# Patient Record
Sex: Female | Born: 1935 | Race: White | Hispanic: No | Marital: Married | Smoking: Former smoker
Health system: Southern US, Community
[De-identification: ages and names within clinical notes are randomized; demographics above are authoritative.]

## PROBLEM LIST (undated history)

## (undated) ENCOUNTER — Ambulatory Visit: Payer: MEDICARE

## (undated) ENCOUNTER — Encounter

## (undated) ENCOUNTER — Encounter: Attending: Internal Medicine | Primary: Internal Medicine

## (undated) ENCOUNTER — Encounter: Attending: Adult Health | Primary: Adult Health

## (undated) ENCOUNTER — Telehealth

## (undated) ENCOUNTER — Encounter
Attending: Student in an Organized Health Care Education/Training Program | Primary: Student in an Organized Health Care Education/Training Program

## (undated) ENCOUNTER — Encounter: Attending: Nurse Practitioner | Primary: Nurse Practitioner

## (undated) ENCOUNTER — Ambulatory Visit

## (undated) ENCOUNTER — Encounter: Attending: Women's Health | Primary: Women's Health

## (undated) ENCOUNTER — Encounter: Attending: Physical Medicine & Rehabilitation | Primary: Physical Medicine & Rehabilitation

## (undated) ENCOUNTER — Encounter: Attending: Diagnostic Radiology | Primary: Diagnostic Radiology

## (undated) ENCOUNTER — Encounter: Attending: Surgical Oncology | Primary: Surgical Oncology

## (undated) ENCOUNTER — Ambulatory Visit: Payer: MEDICARE | Attending: Physical Medicine & Rehabilitation | Primary: Physical Medicine & Rehabilitation

## (undated) ENCOUNTER — Telehealth: Attending: Internal Medicine | Primary: Internal Medicine

## (undated) ENCOUNTER — Ambulatory Visit: Attending: Physician Assistant | Primary: Physician Assistant

## (undated) ENCOUNTER — Ambulatory Visit: Payer: MEDICARE | Attending: Orthopaedic Surgery of the Spine | Primary: Orthopaedic Surgery of the Spine

## (undated) DIAGNOSIS — E039 Hypothyroidism, unspecified: Secondary | ICD-10-CM

## (undated) DIAGNOSIS — I10 Essential (primary) hypertension: Secondary | ICD-10-CM

## (undated) DIAGNOSIS — I209 Angina pectoris, unspecified: Secondary | ICD-10-CM

## (undated) DIAGNOSIS — F419 Anxiety disorder, unspecified: Secondary | ICD-10-CM

## (undated) DIAGNOSIS — D6859 Other primary thrombophilia: Secondary | ICD-10-CM

## (undated) DIAGNOSIS — E119 Type 2 diabetes mellitus without complications: Secondary | ICD-10-CM

## (undated) HISTORY — PX: FRACTURE SURGERY: SHX138

## (undated) HISTORY — PX: TONSILLECTOMY: SUR1361

## (undated) HISTORY — PX: BACK SURGERY: SHX140

## (undated) HISTORY — PX: ABDOMINAL SURGERY: SHX537

## (undated) HISTORY — PX: BREAST EXCISIONAL BIOPSY: SUR124

---
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
## 2010-02-20 DIAGNOSIS — I82409 Acute embolism and thrombosis of unspecified deep veins of unspecified lower extremity: Secondary | ICD-10-CM | POA: Insufficient documentation

## 2010-02-20 DIAGNOSIS — E559 Vitamin D deficiency, unspecified: Secondary | ICD-10-CM | POA: Insufficient documentation

## 2010-02-20 DIAGNOSIS — E1121 Type 2 diabetes mellitus with diabetic nephropathy: Secondary | ICD-10-CM | POA: Insufficient documentation

## 2010-02-20 DIAGNOSIS — E782 Mixed hyperlipidemia: Secondary | ICD-10-CM | POA: Insufficient documentation

## 2011-09-18 DIAGNOSIS — M8580 Other specified disorders of bone density and structure, unspecified site: Secondary | ICD-10-CM | POA: Insufficient documentation

## 2012-01-11 DIAGNOSIS — S8990XA Unspecified injury of unspecified lower leg, initial encounter: Secondary | ICD-10-CM | POA: Insufficient documentation

## 2013-05-15 DIAGNOSIS — S92919A Unspecified fracture of unspecified toe(s), initial encounter for closed fracture: Secondary | ICD-10-CM | POA: Insufficient documentation

## 2014-11-19 DIAGNOSIS — I82401 Acute embolism and thrombosis of unspecified deep veins of right lower extremity: Secondary | ICD-10-CM | POA: Insufficient documentation

## 2014-11-19 DIAGNOSIS — K580 Irritable bowel syndrome with diarrhea: Secondary | ICD-10-CM | POA: Insufficient documentation

## 2015-01-20 ENCOUNTER — Emergency Department: Payer: Self-pay | Admitting: Student

## 2015-02-03 ENCOUNTER — Ambulatory Visit: Payer: Self-pay | Admitting: Internal Medicine

## 2015-02-25 LAB — BASIC METABOLIC PANEL
Anion Gap: 10 (ref 7–16)
BUN: 23 mg/dL — ABNORMAL HIGH
Calcium, Total: 9.1 mg/dL
Chloride: 105 mmol/L
Co2: 23 mmol/L
Creatinine: 0.94 mg/dL
EGFR (African American): >60
EGFR (Non-African Amer.): 59 — ABNORMAL LOW
Glucose: 142 mg/dL — ABNORMAL HIGH
Potassium: 4.3 mmol/L
Sodium: 138 mmol/L

## 2015-02-25 LAB — APTT: Activated PTT: 33.2 secs (ref 23.6–35.9)

## 2015-02-25 LAB — CBC
HCT: 43.2 % (ref 35.0–47.0)
HGB: 14.3 g/dL (ref 12.0–16.0)
MCH: 29.8 pg (ref 26.0–34.0)
MCHC: 33 g/dL (ref 32.0–36.0)
MCV: 90 fL (ref 80–100)
Platelet: 193 10*3/uL (ref 150–440)
RBC: 4.79 10*6/uL (ref 3.80–5.20)
RDW: 15 % — ABNORMAL HIGH (ref 11.5–14.5)
WBC: 10.3 10*3/uL (ref 3.6–11.0)

## 2015-02-25 LAB — PROTIME-INR
INR: 2.4
Prothrombin Time: 26.6 secs — ABNORMAL HIGH

## 2015-02-26 LAB — CBC WITH DIFFERENTIAL/PLATELET
Basophil #: 0 10*3/uL (ref 0.0–0.1)
Basophil %: 0.2 %
Eosinophil #: 0 10*3/uL (ref 0.0–0.7)
Eosinophil %: 0 %
HCT: 44.6 % (ref 35.0–47.0)
HGB: 14.6 g/dL (ref 12.0–16.0)
Lymphocyte #: 0.6 10*3/uL — ABNORMAL LOW (ref 1.0–3.6)
Lymphocyte %: 4.9 %
MCH: 29.5 pg (ref 26.0–34.0)
MCHC: 32.8 g/dL (ref 32.0–36.0)
MCV: 90 fL (ref 80–100)
Monocyte #: 0.9 x10 3/mm (ref 0.2–0.9)
Monocyte %: 7.2 %
Neutrophil #: 10.5 10*3/uL — ABNORMAL HIGH (ref 1.4–6.5)
Neutrophil %: 87.7 %
Platelet: 180 10*3/uL (ref 150–440)
RBC: 4.95 10*6/uL (ref 3.80–5.20)
RDW: 15.4 % — ABNORMAL HIGH (ref 11.5–14.5)
WBC: 12 10*3/uL — ABNORMAL HIGH (ref 3.6–11.0)

## 2015-02-26 LAB — BASIC METABOLIC PANEL
Anion Gap: 9 (ref 7–16)
BUN: 17 mg/dL
Calcium, Total: 8.7 mg/dL — ABNORMAL LOW
Chloride: 107 mmol/L
Co2: 24 mmol/L
Creatinine: 0.75 mg/dL
EGFR (African American): >60
EGFR (Non-African Amer.): >60
Glucose: 201 mg/dL — ABNORMAL HIGH
Potassium: 4.5 mmol/L
Sodium: 140 mmol/L

## 2015-02-26 LAB — PROTIME-INR
INR: 2.2
Prothrombin Time: 24.7 secs — ABNORMAL HIGH

## 2015-02-26 LAB — MAGNESIUM: Magnesium: 1.7 mg/dL

## 2015-02-26 LAB — HEMOGLOBIN A1C: Hemoglobin A1C: 6.2 % — ABNORMAL HIGH

## 2015-02-27 LAB — CBC WITH DIFFERENTIAL/PLATELET
Basophil #: 0 10*3/uL (ref 0.0–0.1)
Basophil %: 0.2 %
Eosinophil #: 0 10*3/uL (ref 0.0–0.7)
Eosinophil %: 0.3 %
HCT: 39 % (ref 35.0–47.0)
HGB: 12.9 g/dL (ref 12.0–16.0)
Lymphocyte #: 0.9 10*3/uL — ABNORMAL LOW (ref 1.0–3.6)
Lymphocyte %: 9.6 %
MCH: 30 pg (ref 26.0–34.0)
MCHC: 33.2 g/dL (ref 32.0–36.0)
MCV: 90 fL (ref 80–100)
Monocyte #: 1.1 x10 3/mm — ABNORMAL HIGH (ref 0.2–0.9)
Monocyte %: 12.2 %
Neutrophil #: 7 10*3/uL — ABNORMAL HIGH (ref 1.4–6.5)
Neutrophil %: 77.7 %
Platelet: 149 10*3/uL — ABNORMAL LOW (ref 150–440)
RBC: 4.31 10*6/uL (ref 3.80–5.20)
RDW: 15.1 % — ABNORMAL HIGH (ref 11.5–14.5)
WBC: 9 10*3/uL (ref 3.6–11.0)

## 2015-02-27 LAB — APTT
Activated PTT: 30.1 secs (ref 23.6–35.9)
Activated PTT: 26.9 secs (ref 23.6–35.9)
Activated PTT: 69.9 secs — ABNORMAL HIGH (ref 23.6–35.9)

## 2015-02-27 LAB — PROTIME-INR
INR: 1.4
Prothrombin Time: 17.6 secs — ABNORMAL HIGH

## 2015-02-28 ENCOUNTER — Inpatient Hospital Stay
Admit: 2015-02-28 | Disposition: A | Discharge status: Skilled Nursing Facility | Payer: Self-pay | Attending: Internal Medicine | Admitting: Internal Medicine

## 2015-02-28 LAB — CBC WITH DIFFERENTIAL/PLATELET
Basophil #: 0 10*3/uL (ref 0.0–0.1)
Basophil %: 0.4 %
Eosinophil #: 0.1 10*3/uL (ref 0.0–0.7)
Eosinophil %: 0.7 %
HCT: 41.3 % (ref 35.0–47.0)
HGB: 13.8 g/dL (ref 12.0–16.0)
Lymphocyte #: 1 10*3/uL (ref 1.0–3.6)
Lymphocyte %: 11.9 %
MCH: 30.1 pg (ref 26.0–34.0)
MCHC: 33.3 g/dL (ref 32.0–36.0)
MCV: 90 fL (ref 80–100)
Monocyte #: 1.1 x10 3/mm — ABNORMAL HIGH (ref 0.2–0.9)
Monocyte %: 13.2 %
Neutrophil #: 6.4 10*3/uL (ref 1.4–6.5)
Neutrophil %: 73.8 %
Platelet: 163 10*3/uL (ref 150–440)
RBC: 4.58 10*6/uL (ref 3.80–5.20)
RDW: 14.6 % — ABNORMAL HIGH (ref 11.5–14.5)
WBC: 8.6 10*3/uL (ref 3.6–11.0)

## 2015-02-28 LAB — PROTIME-INR
INR: 1.1
INR: 1
Prothrombin Time: 14 secs
Prothrombin Time: 13.8 secs

## 2015-02-28 LAB — APTT: Activated PTT: 26.9 secs (ref 23.6–35.9)

## 2015-02-28 LAB — HEPARIN LEVEL (UNFRACTIONATED): Anti-Xa(Unfractionated): <0.1 IU/mL — ABNORMAL LOW (ref 0.30–0.70)

## 2015-03-01 LAB — BASIC METABOLIC PANEL
Anion Gap: 8 (ref 7–16)
BUN: 11 mg/dL
Calcium, Total: 8.5 mg/dL — ABNORMAL LOW
Chloride: 106 mmol/L
Co2: 30 mmol/L
Creatinine: 0.7 mg/dL
EGFR (African American): >60
EGFR (Non-African Amer.): >60
Glucose: 156 mg/dL — ABNORMAL HIGH
Potassium: 3.3 mmol/L — ABNORMAL LOW
Sodium: 144 mmol/L

## 2015-03-01 LAB — HEMOGLOBIN: HGB: 13.5 g/dL (ref 12.0–16.0)

## 2015-03-01 LAB — PLATELET COUNT: Platelet: 175 10*3/uL (ref 150–440)

## 2015-03-01 LAB — PROTIME-INR
INR: 1.1
Prothrombin Time: 14.5 secs

## 2015-03-02 LAB — BASIC METABOLIC PANEL
Anion Gap: 5 — ABNORMAL LOW (ref 7–16)
BUN: 14 mg/dL
Calcium, Total: 8.6 mg/dL — ABNORMAL LOW
Chloride: 105 mmol/L
Co2: 30 mmol/L
Creatinine: 0.71 mg/dL
EGFR (African American): >60
EGFR (Non-African Amer.): >60
Glucose: 151 mg/dL — ABNORMAL HIGH
Potassium: 3.5 mmol/L
Sodium: 140 mmol/L

## 2015-03-02 LAB — PROTIME-INR
INR: 1.3
Prothrombin Time: 16.5 secs — ABNORMAL HIGH

## 2015-03-02 LAB — HEMOGLOBIN: HGB: 12.4 g/dL (ref 12.0–16.0)

## 2015-03-03 ENCOUNTER — Encounter: Payer: Self-pay | Admitting: Physician Assistant

## 2015-03-03 LAB — BASIC METABOLIC PANEL
Anion Gap: 7 (ref 7–16)
BUN: 13 mg/dL
Calcium, Total: 9 mg/dL
Chloride: 101 mmol/L
Co2: 32 mmol/L
Creatinine: 0.67 mg/dL
EGFR (African American): >60
EGFR (Non-African Amer.): >60
Glucose: 165 mg/dL — ABNORMAL HIGH
Potassium: 3.3 mmol/L — ABNORMAL LOW
Sodium: 140 mmol/L

## 2015-03-03 LAB — PROTIME-INR
INR: 1.5
Prothrombin Time: 18.1 secs — ABNORMAL HIGH

## 2015-03-09 NOTE — Op Note (Signed)
PATIENT NAME:  Sierra [name], SCHENKEL MR#:  [id number] DATE OF BIRTH:  03-Apr-1936  DATE OF PROCEDURE:  02/28/2015.  PREOPERATIVE DIAGNOSIS:  Left trimalleolar ankle fracture.   POSTOPERATIVE DIAGNOSIS:  Left trimalleolar ankle fracture.   PROCEDURE PERFORMED:  Open reduction and internal fixation of left trimalleolar ankle fracture.   SURGEON:  Laurice Record. Holley Bouche., MD.    ANESTHESIA:  Spinal.   ESTIMATED BLOOD LOSS:  Minimal.   FLUIDS REPLACED:  1200 mL of crystalloid.   TOURNIQUET TIME:  102 minutes.   DRAINS:  None.   IMPLANTS UTILIZED:  Synthes 8-hole one-third tubular plate, seven 3.5 mm cortical screws, and two 4.0 mm cannulated partially threaded cancellous screws.   INDICATIONS FOR SURGERY:  The patient is a 79 year old female who tripped on steps and fell, twisting her left ankle on 02/25/2015.  X-rays demonstrated a displaced trimalleolar ankle fracture.  After discussion of the risks and benefits of surgical intervention, the patient expressed understanding of the risks and benefits and agreed with plans for surgical intervention.   PROCEDURE IN DETAIL:  The patient was brought into the operating room, and after adequate spinal anesthesia was achieved, a tourniquet was placed on the patient's left thigh. Inspection of the skin demonstrated some anterior ecchymosis, and there was a relatively large fracture blister measuring approximately 4-5 cm in diameter along the medial aspect of the ankle.  The skin was otherwise intact.  The ankle and lower leg were cleaned and prepped with alcohol and DuraPrep and draped in the usual sterile fashion.  A "timeout" was performed as per usual protocol.  The left lower extremity was exsanguinated using an Esmarch, and the tourniquet was inflated to 300 mmHg.  A lateral longitudinal incision was made in line with the distal fibula.  Dissection was carried down to the lateral aspect of the fibula.  Fracture site was identified, and fracture hematoma was  evacuated.  Provisional reduction was performed and maintained using bone reduction forceps.  Position was verified in multiple planes using the FluoroScan.  An 8-hole one-third tubular plate was contoured to the lateral aspect of the distal fibula.  The plate was then secured in place using a total of seven 3.5 mm cortical screws.  Good reduction was appreciated.  The wound was irrigated with copious amounts of normal saline with antibiotic solution.  The wound was then closed in layers using first #0 Vicryl followed by #2-0 Vicryl.  Next, attention was directed to the medial malleolus.  A medial curvilinear incision was made extending below the tip of the medial malleolus.  Dissection was carried down to the fracture site, and fracture hematoma was evacuated.  A provisional reduction was performed, and two 1.25 mm distally threaded guidewires were inserted in a retrograde fashion through the medial malleolar fragment in the metaphyseal region.  Good position was appreciated and good reduction was noted.  Measurements were obtained, and two 4.0 mm short-threaded cannulated cancellous screws were advanced over the guidewires with good compression of the fracture site noted.  Guidewires were removed.  Good restoration of the ankle mortise was noted.  The wound was irrigated with copious amounts of normal saline with antibiotic solution.  The incision was reapproximated using first #0 Vicryl followed by 2-0 Vicryl.  The 2 skin incisions were then reapproximated using skin staples.  A sterile dressing was applied followed by application of a posterior splint.  Tourniquet was deflated after a total tourniquet time of 102 minutes.   The patient tolerated the procedure well.  She was transported to the recovery room in stable condition.    ____________________________ Laurice Record. Holley Bouche., MD jph:kc D: 02/28/2015 17:42:32 ET T: 02/28/2015 19:29:17 ET JOB#: [id number]  cc: Jeneen Rinks P. Holley Bouche., MD, <Dictator> JAMES  P Holley Bouche MD ELECTRONICALLY SIGNED 03/06/2015 7:13

## 2015-03-09 NOTE — Consult Note (Signed)
   Present Illness Called to see 79 yo female with history of protein s and protein c deficiency who was on warfarin chronically with a vena caval fulter in place who underwent an orthopaedic procedure today. Noted to have developed afib with controlled vr prior to begining of surgery which was completed with no complications.She remained in afib throughout the procdure. She is currently hemodynamically stable and in afib with controlled vr at 80-90. No ischemia or chest pain. Does not recall any previous afib.   Physical Exam:  GEN no acute distress   HEENT PERRL   NECK No masses   RESP normal resp effort  no use of accessory muscles   CARD Irregular rate and rhythm   ABD denies tenderness  no Abdominal Bruits   LYMPH negative neck   EXTR negative cyanosis/clubbing   SKIN normal to palpation   NEURO cranial nerves intact, motor/sensory function intact   PSYCH A+O to time, place, person   Review of Systems:  Subjective/Chief Complaint no complaints. noted to be in afib   General: No Complaints   Skin: No Complaints   ENT: No Complaints   Eyes: No Complaints   Neck: No Complaints   Respiratory: No Complaints   Cardiovascular: No Complaints   Gastrointestinal: No Complaints   Genitourinary: No Complaints   Vascular: No Complaints   Musculoskeletal: No Complaints   Neurologic: No Complaints   Hematologic: No Complaints   Endocrine: No Complaints   Psychiatric: No Complaints   Review of Systems: All other systems were reviewed and found to be negative   Medications/Allergies Reviewed Medications/Allergies reviewed   Family & Social History:  Family and Social History:  Family History Non-Contributory   Place of Living Home   EKG:  Interpretation afib with controlled vr    Gentamicin: Other  Cipro: Other   Impression 79 yo female with hitory of protein s and c deficiency on warfarin as outpatient for this who sufferred a ankle fracture and was  bridged with lovenox. She has a vena caval filter inplace. She was noted to have developed afib with controlled vr during surgery. Remains in afib at present. No chest pain. rate controlled no ischemia on ekg. Is being bridged back with lovenox due to her porteins/c deficiency and is being placed back on warfarin. Will follow rate but it is contorlled at present. WIll remain on warfarin as she was on previously. INR goal of 2-3. Will proceed with echo to evaluate lv and chamber size. Further recs pending course.   Plan 1. Off unit telemetry 2. Echo 3. Lovenox bridge 4. Follow heart rate and electrolytes 5. Add rate control if needed. 6. Further recs pending course.   Electronic Signatures: Teodoro Spray (MD)  (Signed 22-Apr-16 13:58)  Authored: General Aspect/Present Illness, History and Physical Exam, Review of System, Family & Social History, EKG , Allergies, Impression/Plan   Last Updated: 22-Apr-16 13:58 by Teodoro Spray (MD)

## 2015-03-09 NOTE — Consult Note (Signed)
Brief Consult Note: Diagnosis: Left trimalleolar ankle fracture.   Patient was seen by consultant.   Comments: Discussed status with patient and her husband. Recommend ORIF of left trimalleolar ankle fracture. The risks and benefits of surgical intervention were discussed in detail with the patient. The patient expressed understanding of the risks and benefits and agreed with plans for surgery.  Surgical site signed as per "right site surgery" protocol.  Will proceed with surgery when INR is 1.3.  Situation is complicated by anticoagulation for history of DVT, protein C deficiency, and protein S deficiency. I anticipate the need to bridge her with Lovenox perioperatively. Will discuss consideration of IVC filter with Dr. Lucky Cowboy or Schnier.  Electronic Signatures: Dereck Leep (MD)  (Signed 19-Apr-16 21:41)  Authored: Brief Consult Note   Last Updated: 19-Apr-16 21:41 by Dereck Leep (MD)

## 2015-03-09 NOTE — Consult Note (Signed)
Brief Consult Note: Diagnosis: Hx of DVT, hypercoagulable state, ankle fracture.   Recommend to proceed with surgery or procedure.   Comments: currently INR is therapeutic however, I would initiate heparin once the INR is less than 2.00,  I also recommend an IVC filter wihich will be [placed tomorrow morning.  Electronic Signatures: Hortencia Pilar (MD)  (Signed 20-Apr-16 07:38)  Authored: Brief Consult Note   Last Updated: 20-Apr-16 07:38 by Hortencia Pilar (MD)

## 2015-03-09 NOTE — Discharge Summary (Signed)
Dates of Admission and Diagnosis:  Date of Admission 28-Feb-2015   Date of Discharge 03-Mar-2015   Admitting Diagnosis Trimalleolar left ankle fracture, h/o Right lower extremity DVT in 1997, Protein C and Protein S deficiency, Hypothyroidism, Hypertension and Type 2 diabetes   Final Diagnosis Trimalleolar left ankle fracture, s/p ORIF, h/o Right lower extremity DVT in 1997, Protein C and Protein S deficiency, Hypothyroidism, Hypertension and Type 2 diabetes   Discharge Diagnosis 1 Trimalleolar left ankle fracture, s/p ORIF   2 h/o Right lower extremity DVT in 1997, Protein C and Protein S deficiency   3 Hypothyroidism   4 Hypertension   5 Type 2 diabetes    Chief Complaint/History of Present Illness 79 year old lady with h/o Right lower extremity DVT in 1997, Protein C and Protein S deficiency. Patient has remained on Coumadin since 1997. k/c/o Hypothyroidism, Hypertension and Type 2 diabetes. Patient was presented with ankle swelling and pain, s/p mechanical fall at home. Initial work up revealed Trimalleolar left ankle fracture. Patient was evaluated by orthopedics and admitted for further management.   Allergies:  Gentamicin: Other  Cipro: Other    Routine Chem:  25-Apr-16 06:00   Glucose, Serum  165 (65-99 NOTE: New Reference Range  01/14/15)  BUN 13 (6-20 NOTE: New Reference Range  01/14/15)  Creatinine (comp) 0.67 (0.44-1.00 NOTE: New Reference Range  01/14/15)  Sodium, Serum 140 (135-145 NOTE: New Reference Range  01/14/15)  Potassium, Serum  3.3 (3.5-5.1 NOTE: New Reference Range  01/14/15)  Chloride, Serum 101 (101-111 NOTE: New Reference Range  01/14/15)  CO2, Serum 32 (22-32 NOTE: New Reference Range  01/14/15)  Calcium (Total), Serum 9.0 (8.9-10.3 NOTE: New Reference Range  01/14/15)  Anion Gap 7  eGFR (African American) >60  eGFR (Non-African American) >60 (eGFR values <66m/min/1.73 m2 may be an indication of chronic kidney disease  (CKD). Calculated eGFR is useful in patients with stable renal function. The eGFR calculation will not be reliable in acutely ill patients when serum creatinine is changing rapidly. It is not useful in patients on dialysis. The eGFR calculation may not be applicable to patients at the low and high extremes of body sizes, pregnant women, and vegetarians.)  Routine Coag:  25-Apr-16 06:00   Prothrombin  18.1 (11.4-15.0 NOTE: New Reference Range  12/06/14)  INR 1.5 (INR reference interval applies to patients on anticoagulant therapy. A single INR therapeutic range for coumarins is not optimal for all indications; however, the suggested range for most indications is 2.0 - 3.0. Exceptions to the INR Reference Range may include: Prosthetic heart valves, acute myocardial infarction, prevention of myocardial infarction, and combinations of aspirin and anticoagulant. The need for a higher or lower target INR must be assessed individually. Reference: The Pharmacology and Management of the Vitamin K  antagonists: the seventh ACCP Conference on Antithrombotic and Thrombolytic Therapy. CFYTWK.4628Sept:126 (3suppl): [id number] A HCT value >55% may artifactually increase the PT.  In one study,  the increase was an average of 25%. Reference:  "Effect on Routine and Special Coagulation Testing Values of Citrate Anticoagulant Adjustment in Patients with High HCT Values." American Journal of Clinical Pathology 2006;126:400-405.)   PERTINENT RADIOLOGY STUDIES: XRay:    19-Apr-16 18:22, Ankle Left AP and Lateral  Ankle Left AP and Lateral   REASON FOR EXAM:    post-reduction  COMMENTS:       PROCEDURE: DXR - DXR ANKLE LEFT AP AND LATERAL  - Feb 25 2015  6:22PM     CLINICAL DATA:  Status post ankle fracture dislocation reduction    EXAM:  LEFT ANKLE - 2 VIEW    COMPARISON:  Earlier same day    FINDINGS:  Trimalleolar fracture with interval reduction of tibiotalar  dislocation. Oblique mildly  comminuted, and mildly displaced distal  fibular diaphysis fracture. There is a mildly displaced medial  malleolar fracture. There is a mildly displaced posterior malleolar  fracture. There is widening of the medial tibiotalar joint space.  There is no ankle dislocation.     IMPRESSION:  Interval reduction of tibiotalar dislocation. Trimalleolar left  ankle fracture.      Electronically Signed    By: Kathreen Devoid    On: 02/25/2015 18:43       Verified By: Jennette Banker, M.D., MD    19-Apr-16 19:15, Chest Portable Single View  Chest Portable Single View   REASON FOR EXAM:    pre op  COMMENTS:       PROCEDURE: DXR - DXR PORTABLE CHEST SINGLE VIEW  - Feb 25 2015  7:15PM     CLINICAL DATA:  Preoperative respiratory exam for orthopedic surgery    EXAM:  PORTABLE CHEST - 1 VIEW    COMPARISON:  None.    FINDINGS:  Heart size is normal. Mediastinal shadows are normal. The lungs are  clear. No bony abnormality.   IMPRESSION:  No active disease      Electronically Signed    By: Nelson Chimes M.D.    On: 02/25/2015 19:42         Verified By: Jules Schick, M.D.,   Pertinent Past History:  Pertinent Past History Right lower extremity DVT in 1997,  Protein C and Protein S deficiency Hypothyroidism Hypertension  Type 2 diabetes   Hospital Course:  Hospital Course Patient was admitted with Trimalleolar left ankle fracture s/p mechanical fall at home. Coumadin was held for planned ORIF by orthopedics. Patient underwent IVC filter placement. She remained on Heparin drip pre-operatively. She underwent ORIF. Patient went into atrial fibrillation post-operatively. She was evaluated by cardiology. She received lovenox post-operatively. Coumadin was resumed. Patient is high risk for thrombo-embolism in light of her limited mobility, h/o DVT, Protein C and Protein S deficiency. INR 1.5 today. Norvasc 5 mg daily was added, Lisinopril was increased for elevated BP. Atenolol was  continued. Synthroid was continued for hypothyroidism. She received sliding scale insulin coverage for type 2 diabetes. Plan is to discharge to [facility] today. Patient to continue rehab at [facility]. Plan is to continue Lovenox until INR is therapeutic. Goal INR between 2 and 3. Repeat PT/INR on 03/05/15 and call MD.   Condition on Discharge Satisfactory   DISCHARGE INSTRUCTIONS HOME MEDS:  Medication Reconciliation: Patient's Home Medications at Discharge:     Medication Instructions  atenolol 25 mg oral tablet  1 tab(s) orally once a day   atorvastatin 40 mg oral tablet  1 tab(s) orally once a day (at bedtime)   budesonide 3 mg oral capsule, extended release  2 cap(s) orally once a day   cymbalta 60 mg oral delayed release capsule  1 cap(s) orally once a day   janumet 500 mg-50 mg oral tablet  1 tab(s) orally 2 times a day   warfarin 5 mg oral tablet  1 tab(s) orally once a day   atropine-diphenoxylate 0.025 mg-2.5 mg oral tablet  2 tab(s) orally 4 times a day, As Needed   omeprazole 40 mg oral delayed release capsule  1 cap(s) orally once a  day   synthroid 88 mcg (0.088 mg) oral tablet  1 tab(s) orally once a day   chlordiazepoxide-clidinium 5 mg-2.5 mg oral capsule  before breakfast   calcium 500+d 500 mg-400 intl units oral tablet, chewable  1 tab(s) orally 2 times a day   vitamin d3 1000 intl units oral capsule  2  orally 2 times a day   lisinopril 20 mg oral tablet  1 tab(s) orally 2 times a day   acetaminophen 500 mg oral tablet  1 tab(s) orally every 4 hours, As needed, fever   tramadol 50 mg oral tablet  1 tab(s) orally every 4 hours, As needed, moderate pain (4-6/10)   magnesium hydroxide 8% oral suspension  30 milliliter(s) orally 2 times a day, As needed, constipation   enoxaparin  30 milligram(s) subcutaneous every 12 hours   bisacodyl 10 mg rectal suppository  1 suppository(ies) rectal once a day, As needed, constipation   docusate-senna 50 mg-8.6 mg oral tablet  1 tab(s)  orally 2 times a day   amlodipine 5 mg oral tablet  1 tab(s) orally once a day     Physician's Instructions:  Diet Low Sodium  Carbohydrate Controlled (ADA) Diet   Activity Limitations As tolerated   Return to Work Not Applicable   Time frame for Follow Up Appointment 1-2 weeks  Dr. Marry Guan   Time frame for Follow Up Appointment 1-2 weeks  Dr Glendon Axe   Time frame for Follow Up Appointment 2-4 weeks  Dr Lucky Cowboy     Leotis Pain S(Ordered): [facility] [address], [place], [place] [address], [employer], Grantley Savage(Attending Physician): [facility], [address], [place], [place] [address], [place]   Skip Estimable P(Consultant): [facility], [address] [place], [place] [address], [place] [phone]  TIME SPENT:  Total Time: Greater than 30 minutes   Electronic Signatures: Glendon Axe (MD)  (Signed 25-Apr-16 15:02)  Authored: ADMISSION DATE AND DIAGNOSIS, CHIEF COMPLAINT/HPI, Allergies, PERTINENT LABS, PERTINENT RADIOLOGY STUDIES, PERTINENT PAST HISTORY, HOSPITAL COURSE, DISCHARGE INSTRUCTIONS HOME MEDS, PATIENT INSTRUCTIONS, Follow Up Physician, TIME SPENT   Last Updated: 25-Apr-16 15:02 by Glendon Axe (MD)

## 2015-03-09 NOTE — Op Note (Signed)
PATIENT NAME:  Sierra [name], Sierra [name] MR#:  [id number] DATE OF BIRTH:  Jul 22, 1936  DATE OF PROCEDURE:  02/27/2015  PREOPERATIVE DIAGNOSIS:   1.  History of deep venous thrombosis and hypercoagulable state.  2.  Ankle fracture requiring cessation of anticoagulation for surgery with high risk of thromboembolic complications.  3.  Hypertension.  4.  Diabetes.   POSTOPERATIVE DIAGNOSIS:   1.  History of deep venous thrombosis and hypercoagulable state.  2.  Ankle fracture requiring cessation of anticoagulation for surgery with high risk of thromboembolic complications.  3.  Hypertension.  4.  Diabetes.   PROCEDURES PERFORMED: 1.  Ultrasound guidance for vascular access to right femoral vein.   2.  Catheter placement into inferior vena cava.  3.  Inferior venacavogram.  4.  Placement of a Cook Celect IVC filter.   SURGEON: Leotis Pain, MD.     ANESTHESIA:  Local with sedation.    ESTIMATED BLOOD LOSS: Minimal.   FLUOROSCOPY TIME:  Less than 1 minute.   CONTRAST USED:  15 mL Visipaque.    INDICATION FOR PROCEDURE:   This is a 79 year old female with a long history of hypercoagulable state chronically maintained on anticoagulation with history of DVT. [name] was admitted to the hospital with severe ankle fracture. She is scheduled for surgery tomorrow. Her anticoagulation will have to be stopped and she is chronically anticoagulated due to her history of DVT. For this reason an IVC filter is indicated. In addition her risk of perioperative thromboembolic complications around her orthopedic surgery is quite high and an IVC filter will be protective.  We would consider removing this in 2-3 months.   Risks and benefits were discussed. Informed consent was obtained.   DESCRIPTION OF PROCEDURE:  The patient was brought to the vascular suite. The skin is sterilely prepped and draped and a sterile surgical field was created. The right femoral vein was accessed under direct ultrasound guidance without  difficulty with a Seldinger needle and a J-wire was then placed. After skin nick and dilatation, the delivery sheath was placed into the inferior vena cava and an inferior venacavogram was performed. This demonstrated a patent IVC with the level of the renal veins at bottom of L1. The filter was then deployed into the inferior vena cava at the level of top of L2.  The delivery sheath was then removed. Pressure was held. Sterile dressing was placed. The patient tolerated the procedure well and was taken to the recovery room in stable condition.     ____________________________ Algernon Huxley, MD jsd:[id number] D: 02/27/2015 12:24:29 ET T: 02/27/2015 14:46:49 ET JOB#: [id number]  cc: Algernon Huxley, MD, <Dictator> Algernon Huxley MD ELECTRONICALLY SIGNED 03/05/2015 13:53

## 2015-03-09 NOTE — H&P (Signed)
PATIENT NAME:  Sierra [name], DIVELBISS MR#:  [id number] DATE OF BIRTH:  [birth year]  DATE OF ADMISSION:  02/25/2015  PRIMARY CARE PHYSICIAN: Glendon Axe, MD  REFERRING EMERGENCY ROOM PHYSICIAN: Eryka A. Edd Fabian, MD   CHIEF COMPLAINT: Left ankle fracture.   HISTORY OF PRESENT ILLNESS: The patient is a 79 year old pleasant Caucasian female who came into the ED after she sustained a fall. The patient was walking down steps carrying objects, tripped over and fell down 3 steps landing on the left ankle. Denies any head injury. X-ray has revealed bimalleolar ankle fracture and tibiofibular dislocation, reviewed by Dr. Marry Guan. ER physician discussed with Dr. Marry Guan. The patient had closed reduction and splinting done by the ED physician per his recommendation. Dr. Marry Guan has recommended the hospitalist team to admit the patient for medical management. During my examination, the patient is resting comfortably, denies any pain. Husband is at bedside. Denies any chest pain, shortness of breath. No history of coronary artery disease or congestive heart failure.   PAST MEDICAL HISTORY: Irritable bowel syndrome, hypertension, diabetes mellitus, and right lower extremity DVT, hypothyroidism, and hyperlipidemia.   PAST SURGICAL HISTORY: Hysterectomy, right lower extremity thrombectomy, right upper extremity lipoma removal.  ALLERGIES: GENTAMICIN.   PSYCHOSOCIAL HISTORY: Lives at home with husband; used to smoke, but quit smoking approximately 40 years ago; denies alcohol or illicit drug usage.   FAMILY HISTORY: Hypertension and diabetes mellitus runs in her family.   HOME MEDICATIONS: Coumadin 5 mg 1 tablet p.o. once daily; vitamin D3 at 1000 international units 2 tablets p.o. 2 times a day; Synthroid 88 mcg p.o. once daily; lisinopril 20 mg p.o. once daily; Janumet 500/50 one tablet p.o. 2 times a day; Cymbalta 60 mg p.o. once daily; chlordiazepoxide before breakfast 1 tablet; calcium with vitamin D 1 tablet p.o. 2 times  a day; budesonide 3 mg 2 capsules p.o. once daily; atropine with diphenoxylate 2 tablets p.o. 4 times a day; atorvastatin 40 p.o. at bedtime; atenolol 25 mg p.o. once daily.   REVIEW OF SYSTEMS:  CONSTITUTIONAL: Denies any fever, fatigue, weakness.  EYES: Denies blurry vision, double vision.  EARS, NOSE, AND THROAT: Denies epistaxis or discharge.  RESPIRATION: Denies cough, COPD.  CARDIOVASCULAR: No chest pain or palpitations.  GASTROINTESTINAL: Denies nausea, vomiting, diarrhea, abdominal pain, hematemesis, melena.  GENITOURINARY: No dysuria, hematuria.  GYNECOLOGICAL AND BREASTS: Denies breast mass or vaginal discharge; had a hysterectomy in the past.  ENDOCRINOLOGY: Denies polyuria, nocturia. Has diabetes mellitus and hypothyroidism.  INTEGUMENTARY: No acne, rash, or lesions.  MUSCULOSKELETAL: Complaining of left lower extremity pain, but after giving pain medications her pain is tolerable. Denies any back pain or shoulder pain.  NEUROLOGIC: Denies vertigo, ataxia.  PSYCHIATRIC: The patient is on Cymbalta; probably she has anxiety or depression, but she did not mention any of them. Denies any OCD or bipolar disorder.   PHYSICAL EXAMINATION: VITAL SIGNS: Temperature 98.6, pulse 59, respirations 18, blood pressure 140/61, pulse of 95% on room air.  GENERAL APPEARANCE: Not in acute distress. Moderately built and nourished.  HEENT: Normocephalic, atraumatic. Pupils are equally reacting to light and accommodation. No scleral icterus. No conjunctival injection. No sinus tenderness. No postnasal drip. Moist mucous membranes.  NECK: Supple. No JVD. No thyromegaly. Range of motion is intact.  LUNGS: Clear to auscultation bilaterally. No accessory muscle use and no anterior chest wall tenderness on palpation.  CARDIAC: S1, S2 normal. Regular rate and rhythm. No murmur.  GASTROINTESTINAL: Soft. Bowel sounds are positive in all 4 quadrants. Nontender, nondistended. No  masses. NEUROLOGICAL: Awake,  alert, oriented x3. Cranial nerves II-XII are grossly intact. Motor and sensory are intact. Reflexes are 2+. EXTREMITIES: Left lower extremity with fracture, status post closed reduction and fixation by ED physician, placed in a cast. PSYCHIATRIC: Normal mood and affect.   LABORATORY AND IMAGING STUDIES: Left ankle complete fracture of the medial and lateral malleoli, tibiotalar dislocation left ankle. AP and lateral views: Interval reduction of tibiotalar dislocation. Bimalleolar left ankle fracture. Chest x-ray is ordered, which is pending. Twelve-lead EKG is pending.   CBC is normal. PT 26.6, INR 2.4. BMP: Glucose 142, BUN 23, rest of the BMP is normal.   ASSESSMENT AND PLAN: A 79 year old Caucasian female who came into the ED after she sustained a fall, diagnosed with left lower extremity bimalleolar are fractures with tibiotalar dislocation, currently on Coumadin. INR is therapeutic at 2.4. Emergency Room physician has discussed this with Dr. Marry Guan, on-call orthopedics. He is to aware.  1.  Left ankle bimalleolar fracture with tibiotalar dislocation. Will admit her to surgical floor.  Consult is placed to Dr. Marry Guan. We will optimize her for surgery after INR is subtherapeutic, close to 1.5, and reviewing chest x-ray and EKG which are pending. Pain management will be provided with Percocet and morphine. Probably, she will be going to OR on Thursday or Friday, might need FFP on Thursday/friday  morning for complete INR reversal. I will discontinue Coumadin and other anticoagulants like Lovenox and heparin subcutaneous for now as INR is therapeutic. We will check a.m. labs.once INR subtherapeutic , attending physician  to consider therapeutic dose lovenox for bridging  which can be d/ced 12 hrs prior to sx   PT consult is placed to follow up on the patient after surgery.  2.  History of right lower extremity deep venous thrombosis status post thrombectomy, on Coumadin. INR is therapeutic. We are  discontinuing Coumadin for possible surgery on the left ankle fracture. We will monitor PT-INR closely, check a.m. labs.  3.  Hypertension. We will resume her to home medications, atenolol and lisinopril. We will uptitrate it as-needed basis.  4.  Hypothyroidism. Continue Synthroid.  5.  Diabetes mellitus. We will put her on sliding scale insulin.  6.  Irritable bowel syndrome. Resume her home medication, atropine, diphenoxylate 4 times a day. She will be provided with bowel regimen and gastrointestinal prophylaxis with Protonix.   CODE STATUS: She is full code. Husband is the medical power of attorney.   The patient will be transferred to [facility]. Dr. Glendon Axe, her primary care physician. Plan of care discussed with the patient and her husband at bedside. They both verbalized understanding of the plan.   TOTAL TIME SPENT: Forty-five minutes.    ____________________________ Nicholes Mango, MD ag:TM D: 02/25/2015 19:32:09 ET T: 02/25/2015 20:18:07 ET JOB#: [id number]  cc: Nicholes Mango, MD, <Dictator> Nicholes Mango MD ELECTRONICALLY SIGNED 02/25/2015 21:10

## 2015-03-11 DIAGNOSIS — S82852D Displaced trimalleolar fracture of left lower leg, subsequent encounter for closed fracture with routine healing: Secondary | ICD-10-CM | POA: Insufficient documentation

## 2015-05-13 ENCOUNTER — Ambulatory Visit
Admission: RE | Admit: 2015-05-13 | Discharge: 2015-05-13 | Disposition: A | Discharge status: Home / Self Care | Payer: Medicare Other | Source: Ambulatory Visit | Attending: Vascular Surgery | Admitting: Vascular Surgery

## 2015-05-13 ENCOUNTER — Encounter: Payer: Self-pay | Admitting: *Deleted

## 2015-05-13 ENCOUNTER — Encounter
Admission: RE | Disposition: A | Discharge status: Home / Self Care | Payer: Self-pay | Source: Ambulatory Visit | Attending: Vascular Surgery

## 2015-05-13 DIAGNOSIS — Z86711 Personal history of pulmonary embolism: Secondary | ICD-10-CM | POA: Diagnosis present

## 2015-05-13 DIAGNOSIS — Z7901 Long term (current) use of anticoagulants: Secondary | ICD-10-CM | POA: Diagnosis not present

## 2015-05-13 DIAGNOSIS — Z4689 Encounter for fitting and adjustment of other specified devices: Secondary | ICD-10-CM | POA: Insufficient documentation

## 2015-05-13 DIAGNOSIS — Z79899 Other long term (current) drug therapy: Secondary | ICD-10-CM | POA: Diagnosis not present

## 2015-05-13 DIAGNOSIS — D6859 Other primary thrombophilia: Secondary | ICD-10-CM | POA: Diagnosis not present

## 2015-05-13 HISTORY — DX: Anxiety disorder, unspecified: F41.9

## 2015-05-13 HISTORY — DX: Angina pectoris, unspecified: I20.9

## 2015-05-13 HISTORY — DX: Hypothyroidism, unspecified: E03.9

## 2015-05-13 HISTORY — DX: Type 2 diabetes mellitus without complications: E11.9

## 2015-05-13 HISTORY — DX: Essential (primary) hypertension: I10

## 2015-05-13 HISTORY — PX: PERIPHERAL VASCULAR CATHETERIZATION: SHX172C

## 2015-05-13 LAB — PROTIME-INR
INR: 1.2
Prothrombin Time: 15.4 seconds — ABNORMAL HIGH (ref 11.4–15.0)

## 2015-05-13 SURGERY — IVC FILTER REMOVAL
Anesthesia: Moderate Sedation

## 2015-05-13 MED ORDER — LIDOCAINE HCL (PF) 1 % IJ SOLN
INTRAMUSCULAR | Status: AC
  Filled 2015-05-13: qty 10

## 2015-05-13 MED ORDER — FENTANYL CITRATE (PF) 100 MCG/2ML IJ SOLN
INTRAMUSCULAR | Status: DC | PRN
  Administered 2015-05-13: 100 ug via INTRAVENOUS

## 2015-05-13 MED ORDER — FENTANYL CITRATE (PF) 100 MCG/2ML IJ SOLN
INTRAMUSCULAR | Status: AC
  Filled 2015-05-13: qty 2

## 2015-05-13 MED ORDER — SODIUM CHLORIDE 0.9 % IV SOLN
INTRAVENOUS | Status: DC
  Administered 2015-05-13: 14:00:00 via INTRAVENOUS

## 2015-05-13 MED ORDER — MIDAZOLAM HCL 2 MG/2ML IJ SOLN
INTRAMUSCULAR | Status: AC
  Filled 2015-05-13: qty 2

## 2015-05-13 MED ORDER — MIDAZOLAM HCL 2 MG/2ML IJ SOLN
INTRAMUSCULAR | Status: DC | PRN
  Administered 2015-05-13: 2 mg via INTRAVENOUS

## 2015-05-13 MED ORDER — IOHEXOL 300 MG/ML  SOLN
INTRAMUSCULAR | Status: DC | PRN
  Administered 2015-05-13: 15 mL via INTRAVENOUS

## 2015-05-13 MED ORDER — HEPARIN (PORCINE) IN NACL 2-0.9 UNIT/ML-% IJ SOLN
INTRAMUSCULAR | Status: AC
  Filled 2015-05-13: qty 500

## 2015-05-13 SURGICAL SUPPLY — 4 items
PACK ANGIOGRAPHY (CUSTOM PROCEDURE TRAY) ×2 IMPLANT
SET VENACAVA FILTER RETRIEVAL (MISCELLANEOUS) ×2 IMPLANT
TOWEL OR 17X26 4PK STRL BLUE (TOWEL DISPOSABLE) ×2 IMPLANT
WIRE G STARTER 3X180 (WIRE) ×2 IMPLANT

## 2015-05-13 NOTE — Discharge Instructions (Signed)

## 2015-05-13 NOTE — H&P (Signed)
[facility] History & Physical Update  The patient was interviewed and re-examined.  The patient's previous History and Physical has been reviewed and is unchanged.  There is no change in the plan of care. We plan to proceed with the scheduled procedure.  Sierra [name], Sierra Lory, MD  05/13/2015, 3:20 PM

## 2015-05-13 NOTE — Op Note (Signed)
  OPERATIVE NOTE   PRE-OPERATIVE DIAGNOSIS: DVT with history of PE; hypercoagulable state  POST-OPERATIVE DIAGNOSIS: Same  PROCEDURE: 1. Retrieval of IVC Filter 2. Inferior Vena Cavagram  SURGEON: Katha Cabal, M.D.  ANESTHESIA:  Conscious Sedation  ESTIMATED BLOOD LOSS: Minimal cc  FINDING(S):inferior vena cava is widely patent filter is in place in good position. Filter is removed without incident  SPECIMEN(S):  IVC filter intact  INDICATIONS:   Sierra [name] is a 79 y.o. female who presents with history of IVC filter placement after notation of a DVT with subsequent pulmonary embolism. Currently she is tolerating her anticoagulations well and is therefore undergoing removal of her IVC filter. The risks and benefits were reviewed all questions were answered patient agrees to proceed.  DESCRIPTION: After obtaining full informed written consent, the patient was brought back to the Special Procedure Suite and placed in the supine position.  The patient received IV antibiotics prior to induction.  After obtaining adequate sedation, the patient was prepped and draped in the standard fashion and appropriate time out is called.     Ultrasound was placed in a sterile sleeve.The right neck was then imaged with ultrasound.   Jugular vein was identified it is echolucent and homogeneous indicating patency. 1% lidocaine is then infiltrated under ultrasound visualization and subsequently a Seldinger needle is inserted under real-time ultrasound guidance.  J-wire is then advanced into the inferior vena cava under fluoroscopic guidance. With the tip of the sheath at the confluence of the iliac veins inferior vena caval imaging is performed.  After review of the image the sheath is repositioned to above the filter and the snares introduced. Snares opened and the hook is secured without difficulty. The filter is then collapsed within the sheath and removed without difficulty.  Sheath is removed  by pressures held the patient tolerated the procedure well and there were no immediate complications.  Interpretation: inferior vena cava is widely patent filter is in place in good position. Filter is removed without incident.     COMPLICATIONS: None  CONDITION: Sierra [name], M.D. [facility] Office: [phone]   05/13/2015, 3:21 PM

## 2015-05-14 ENCOUNTER — Encounter: Payer: Self-pay | Admitting: Vascular Surgery

## 2015-09-04 ENCOUNTER — Other Ambulatory Visit: Payer: Self-pay | Admitting: Gastroenterology

## 2015-09-04 DIAGNOSIS — K580 Irritable bowel syndrome with diarrhea: Secondary | ICD-10-CM

## 2015-09-09 ENCOUNTER — Other Ambulatory Visit: Payer: Self-pay | Admitting: Gastroenterology

## 2015-09-09 DIAGNOSIS — K580 Irritable bowel syndrome with diarrhea: Secondary | ICD-10-CM

## 2015-09-10 ENCOUNTER — Ambulatory Visit
Admission: RE | Admit: 2015-09-10 | Discharge: 2015-09-10 | Disposition: A | Discharge status: Home / Self Care | Payer: Medicare Other | Source: Ambulatory Visit | Attending: Gastroenterology | Admitting: Gastroenterology

## 2015-09-10 DIAGNOSIS — K8020 Calculus of gallbladder without cholecystitis without obstruction: Secondary | ICD-10-CM | POA: Insufficient documentation

## 2015-09-10 DIAGNOSIS — R197 Diarrhea, unspecified: Secondary | ICD-10-CM | POA: Diagnosis present

## 2015-09-10 DIAGNOSIS — K8681 Exocrine pancreatic insufficiency: Secondary | ICD-10-CM | POA: Diagnosis not present

## 2015-09-10 DIAGNOSIS — K580 Irritable bowel syndrome with diarrhea: Secondary | ICD-10-CM

## 2015-09-10 DIAGNOSIS — K760 Fatty (change of) liver, not elsewhere classified: Secondary | ICD-10-CM | POA: Insufficient documentation

## 2015-09-10 MED ORDER — IOHEXOL 300 MG/ML  SOLN
100.0000 mL | Freq: Once | INTRAMUSCULAR | Status: AC | PRN
  Administered 2015-09-10: 100 mL via INTRAVENOUS

## 2015-12-15 ENCOUNTER — Other Ambulatory Visit
Admission: RE | Admit: 2015-12-15 | Discharge: 2015-12-15 | Disposition: A | Discharge status: Home / Self Care | Payer: Medicare Other | Source: Ambulatory Visit | Attending: Gastroenterology | Admitting: Gastroenterology

## 2015-12-15 DIAGNOSIS — R197 Diarrhea, unspecified: Secondary | ICD-10-CM | POA: Diagnosis present

## 2015-12-15 LAB — GASTROINTESTINAL PANEL BY PCR, STOOL (REPLACES STOOL CULTURE)

## 2015-12-25 ENCOUNTER — Other Ambulatory Visit: Payer: Self-pay | Admitting: Internal Medicine

## 2015-12-25 DIAGNOSIS — E1129 Type 2 diabetes mellitus with other diabetic kidney complication: Secondary | ICD-10-CM

## 2015-12-25 DIAGNOSIS — R809 Proteinuria, unspecified: Secondary | ICD-10-CM

## 2015-12-31 ENCOUNTER — Other Ambulatory Visit: Payer: Self-pay | Admitting: Internal Medicine

## 2015-12-31 DIAGNOSIS — Z1231 Encounter for screening mammogram for malignant neoplasm of breast: Secondary | ICD-10-CM

## 2016-01-20 ENCOUNTER — Ambulatory Visit: Payer: Medicare Other | Admitting: Anesthesiology

## 2016-01-20 ENCOUNTER — Encounter
Admission: RE | Disposition: A | Discharge status: Home / Self Care | Payer: Self-pay | Source: Ambulatory Visit | Attending: Gastroenterology

## 2016-01-20 ENCOUNTER — Ambulatory Visit
Admission: RE | Admit: 2016-01-20 | Discharge: 2016-01-20 | Disposition: A | Discharge status: Home / Self Care | Payer: Medicare Other | Source: Ambulatory Visit | Attending: Gastroenterology | Admitting: Gastroenterology

## 2016-01-20 ENCOUNTER — Encounter: Payer: Self-pay | Admitting: *Deleted

## 2016-01-20 DIAGNOSIS — Z881 Allergy status to other antibiotic agents status: Secondary | ICD-10-CM | POA: Insufficient documentation

## 2016-01-20 DIAGNOSIS — E039 Hypothyroidism, unspecified: Secondary | ICD-10-CM | POA: Diagnosis not present

## 2016-01-20 DIAGNOSIS — K529 Noninfective gastroenteritis and colitis, unspecified: Secondary | ICD-10-CM | POA: Diagnosis not present

## 2016-01-20 DIAGNOSIS — Z885 Allergy status to narcotic agent status: Secondary | ICD-10-CM | POA: Diagnosis not present

## 2016-01-20 DIAGNOSIS — Z79899 Other long term (current) drug therapy: Secondary | ICD-10-CM | POA: Insufficient documentation

## 2016-01-20 DIAGNOSIS — Z888 Allergy status to other drugs, medicaments and biological substances status: Secondary | ICD-10-CM | POA: Insufficient documentation

## 2016-01-20 DIAGNOSIS — E119 Type 2 diabetes mellitus without complications: Secondary | ICD-10-CM | POA: Insufficient documentation

## 2016-01-20 DIAGNOSIS — F419 Anxiety disorder, unspecified: Secondary | ICD-10-CM | POA: Insufficient documentation

## 2016-01-20 DIAGNOSIS — I10 Essential (primary) hypertension: Secondary | ICD-10-CM | POA: Diagnosis not present

## 2016-01-20 DIAGNOSIS — Z7901 Long term (current) use of anticoagulants: Secondary | ICD-10-CM | POA: Diagnosis not present

## 2016-01-20 DIAGNOSIS — Z86010 Personal history of colonic polyps: Secondary | ICD-10-CM | POA: Insufficient documentation

## 2016-01-20 DIAGNOSIS — R194 Change in bowel habit: Secondary | ICD-10-CM | POA: Diagnosis not present

## 2016-01-20 DIAGNOSIS — Z87891 Personal history of nicotine dependence: Secondary | ICD-10-CM | POA: Insufficient documentation

## 2016-01-20 DIAGNOSIS — K648 Other hemorrhoids: Secondary | ICD-10-CM | POA: Insufficient documentation

## 2016-01-20 DIAGNOSIS — K5730 Diverticulosis of large intestine without perforation or abscess without bleeding: Secondary | ICD-10-CM | POA: Insufficient documentation

## 2016-01-20 HISTORY — PX: COLONOSCOPY WITH PROPOFOL: SHX5780

## 2016-01-20 LAB — GLUCOSE, CAPILLARY: Glucose-Capillary: 187 mg/dL — ABNORMAL HIGH (ref 65–99)

## 2016-01-20 LAB — PROTIME-INR
INR: 1.29
Prothrombin Time: 16.2 seconds — ABNORMAL HIGH (ref 11.4–15.0)

## 2016-01-20 SURGERY — COLONOSCOPY WITH PROPOFOL
Anesthesia: General

## 2016-01-20 MED ORDER — PROPOFOL 10 MG/ML IV BOLUS
INTRAVENOUS | Status: DC | PRN
  Administered 2016-01-20: 80 mg via INTRAVENOUS

## 2016-01-20 MED ORDER — PROPOFOL 500 MG/50ML IV EMUL
INTRAVENOUS | Status: DC | PRN
  Administered 2016-01-20: 100 ug/kg/min via INTRAVENOUS

## 2016-01-20 MED ORDER — EPHEDRINE SULFATE 50 MG/ML IJ SOLN
INTRAMUSCULAR | Status: DC | PRN
  Administered 2016-01-20 (×2): 15 mg via INTRAVENOUS

## 2016-01-20 MED ORDER — SODIUM CHLORIDE 0.9 % IV SOLN
INTRAVENOUS | Status: DC
  Administered 2016-01-20: 09:00:00 via INTRAVENOUS

## 2016-01-20 MED ORDER — GLYCOPYRROLATE 0.2 MG/ML IJ SOLN
INTRAMUSCULAR | Status: DC | PRN
  Administered 2016-01-20: 0.2 mg via INTRAVENOUS

## 2016-01-20 MED ORDER — FENTANYL CITRATE (PF) 100 MCG/2ML IJ SOLN
INTRAMUSCULAR | Status: DC | PRN
  Administered 2016-01-20: 50 ug via INTRAVENOUS

## 2016-01-20 MED ORDER — LIDOCAINE HCL (CARDIAC) 20 MG/ML IV SOLN
INTRAVENOUS | Status: DC | PRN
  Administered 2016-01-20: 100 mg via INTRAVENOUS

## 2016-01-20 MED ORDER — SODIUM CHLORIDE 0.9 % IV SOLN: INTRAVENOUS | Status: DC

## 2016-01-20 NOTE — H&P (Signed)
Outpatient short stay form Pre-procedure 01/20/2016 9:06 AM Lollie Sails MD  Primary Physician: Dr. Glendon Axe  Reason for visit:  Colonoscopy  History of present illness:  Patient is a 80 year old female presenting today for colonoscopy. She has a personal history of diarrhea that is chronic however this is worsened past couple of months. She also has a personal history of adenomatous colon polyps. She tolerated her prep well. She does take Coumadin and discontinued this several days ago. Her pro time was checked this morning and noted to be 16.2 with an INR 1.29. This is adequate reduction for procedure today. She has a history of protein C+S deficiency.    Current facility-administered medications:  .  0.9 %  sodium chloride infusion, , Intravenous, Continuous, Lollie Sails, MD, Last Rate: 20 mL/hr at 01/20/16 [id number] .  0.9 %  sodium chloride infusion, , Intravenous, Continuous, Lollie Sails, MD  Prescriptions prior to admission  Medication Sig Dispense Refill Last Dose  . atenolol (TENORMIN) 25 MG tablet Take 25 mg by mouth daily.   01/20/2016 at [place]  . atorvastatin (LIPITOR) 40 MG tablet Take 40 mg by mouth daily.   05/13/2015 at Unknown time  . budesonide (ENTOCORT EC) 3 MG 24 hr capsule Take 3 mg by mouth daily.     . clidinium-chlordiazePOXIDE (LIBRAX) 5-2.5 MG per capsule Take 1 capsule by mouth.     . diphenoxylate-atropine (LOMOTIL) 2.5-0.025 MG per tablet Take 1 tablet by mouth 4 (four) times daily as needed for diarrhea or loose stools.   05/13/2015 at Unknown time  . DULoxetine (CYMBALTA) 60 MG capsule Take 60 mg by mouth daily.   05/12/2015 at Unknown time  . levothyroxine (SYNTHROID, LEVOTHROID) 88 MCG tablet Take 88 mcg by mouth daily before breakfast.   05/13/2015 at Unknown time  . lisinopril (PRINIVIL,ZESTRIL) 20 MG tablet Take 20 mg by mouth daily.   05/13/2015 at Unknown time  . omeprazole (PRILOSEC) 40 MG capsule Take 40 mg by mouth daily.   05/13/2015 at Unknown  time  . sitaGLIPtin-metformin (JANUMET) 50-500 MG per tablet Take 2 tablets by mouth 2 (two) times daily with a meal.   05/12/2015 at Unknown time  . warfarin (COUMADIN) 5 MG tablet Take 5 mg by mouth daily.   Past Week at Unknown time     Allergies  Allergen Reactions  . Ciprofloxacin Other (See Comments)    Kidney function  . Gentamicin Other (See Comments)    Kidney function  . Morphine And Related Other (See Comments)    Altered LOC     Past Medical History  Diagnosis Date  . Hypertension   . Hypothyroidism   . Diabetes mellitus without complication ([place])   . Anginal pain ([place])   . Anxiety     Review of systems:      Physical Exam    Heart and lungs: Regular rate and rhythm without rub or gallop, lungs are bilaterally clear.    HEENT: Normocephalic atraumatic eyes are anicteric    Other:     Pertinant exam for procedure: Soft nontender nondistended bowel sounds positive normoactive.    Planned proceedures: Colonoscopy and indicated procedures. I have discussed the risks benefits and complications of procedures to include not limited to bleeding, infection, perforation and the risk of sedation and the patient wishes to proceed.    Lollie Sails, MD Gastroenterology 01/20/2016  9:06 AM

## 2016-01-20 NOTE — Anesthesia Postprocedure Evaluation (Signed)
Anesthesia Post Note  Patient: Sierra [name]  Procedure(s) Performed: Procedure(s) (LRB): COLONOSCOPY WITH PROPOFOL (N/A)  Patient location during evaluation: PACU Anesthesia Type: General Level of consciousness: awake and alert Pain management: pain level controlled Vital Signs Assessment: post-procedure vital signs reviewed and stable Respiratory status: spontaneous breathing, nonlabored ventilation, respiratory function stable and patient connected to nasal cannula oxygen Cardiovascular status: blood pressure returned to baseline and stable Postop Assessment: no signs of nausea or vomiting Anesthetic complications: no    Last Vitals:  Filed Vitals:   01/20/16 0939 01/20/16 0943  BP: 92/56 92/56  Pulse: 77 78  Temp: 36.7 C 36.7 C  Resp: 17 16    Last Pain: There were no vitals filed for this visit.               Molli Barrows

## 2016-01-20 NOTE — Op Note (Signed)
[facility] Gastroenterology Patient Name: Sierra [name] Procedure Date: 01/20/2016 9:07 AM MRN: [id number] Account #: [id number] Date of Birth: 02/03/1936 Admit Type: Outpatient Age: 80 Room: [facility] ENDO ROOM 3 Gender: Female Note Status: Finalized Procedure:            Colonoscopy Indications:          Personal history of colonic polyps, Change in bowel                        habits, Chronic diarrhea Providers:            Lollie Sails, MD Referring MD:         Glendon Axe (Referring MD) Medicines:            Monitored Anesthesia Care Complications:        No immediate complications. Procedure:            Pre-Anesthesia Assessment:                       - ASA Grade Assessment: III - A patient with severe                        systemic disease.                       After obtaining informed consent, the colonoscope was                        passed under direct vision. Throughout the procedure,                        the patient's blood pressure, pulse, and oxygen                        saturations were monitored continuously. The                        Colonoscope was introduced through the anus and                        advanced to the the cecum, identified by appendiceal                        orifice and ileocecal valve. The colonoscopy was                        performed with moderate difficulty due to a tortuous                        colon. Successful completion of the procedure was aided                        by using manual pressure. The quality of the bowel                        preparation was good. Findings:      A few medium-mouthed diverticula were found in the sigmoid colon,       transverse colon and ascending colon.      The colon (entire examined portion) was moderately tortuous.      Non-bleeding internal hemorrhoids were found  during retroflexion and       during anoscopy. The hemorrhoids were small and Grade I (internal   hemorrhoids that do not prolapse).      The digital rectal exam was normal.      The digital rectal exam findings include perirectal dermatitis and       external skin tags.      Biopsies for histology were taken with a cold forceps from the right       colon and left colon for evaluation of microscopic colitis. Impression:           - Diverticulosis in the sigmoid colon, in the                        transverse colon and in the ascending colon.                       - Tortuous colon.                       - Non-bleeding internal hemorrhoids.                       - Perirectal dermatitis and external skin tags. found                        on digital rectal exam.                       - Biopsies were taken with a cold forceps from the                        right colon and left colon for evaluation of                        microscopic colitis. Recommendation:       - Await pathology results.                       - Return to GI office in 1 month. Procedure Code(s):    --- Professional ---                       [phone], Colonoscopy, flexible; with biopsy, single or                        multiple Diagnosis Code(s):    --- Professional ---                       K64.0, First degree hemorrhoids                       Z86.010, Personal history of colonic polyps                       R19.4, Change in bowel habit                       K52.9, Noninfective gastroenteritis and colitis,                        unspecified  K57.30, Diverticulosis of large intestine without                        perforation or abscess without bleeding                       Q43.8, Other specified congenital malformations of                        intestine CPT copyright 2016 American Medical Association. All rights reserved. The codes documented in this report are preliminary and upon coder review may  be revised to meet current compliance requirements. Lollie Sails, MD 01/20/2016 9:36:58  AM This report has been signed electronically. Number of Addenda: 0 Note Initiated On: 01/20/2016 9:07 AM Scope Withdrawal Time: 0 hours 6 minutes 46 seconds  Total Procedure Duration: 0 hours 15 minutes 7 seconds       [facility]

## 2016-01-20 NOTE — Transfer of Care (Signed)
Immediate Anesthesia Transfer of Care Note  Patient: Sierra [name]  Procedure(s) Performed: Procedure(s): COLONOSCOPY WITH PROPOFOL (N/A)  Patient Location: PACU  Anesthesia Type:General  Level of Consciousness: awake, alert , oriented and patient cooperative  Airway & Oxygen Therapy: Patient Spontanous Breathing and Patient connected to nasal cannula oxygen  Post-op Assessment: Report given to RN and Post -op Vital signs reviewed and stable  Post vital signs: Reviewed and stable  Last Vitals:  Filed Vitals:   01/20/16 0939 01/20/16 0943  BP: 92/56 92/56  Pulse: 77 78  Temp: 36.7 C 36.7 C  Resp: 17 16    Complications: No apparent anesthesia complications

## 2016-01-20 NOTE — Anesthesia Preprocedure Evaluation (Signed)
Anesthesia Evaluation  Patient identified by MRN, date of birth, ID band Patient awake    Reviewed: Allergy & Precautions, H&P , NPO status , Patient's Chart, lab work & pertinent test results, reviewed documented beta blocker date and time   Airway Mallampati: II   Neck ROM: full    Dental  (+) Teeth Intact   Pulmonary neg pulmonary ROS, former smoker,    Pulmonary exam normal        Cardiovascular Exercise Tolerance: Poor hypertension, (-) anginanegative cardio ROS Normal cardiovascular exam     Neuro/Psych negative neurological ROS  negative psych ROS   GI/Hepatic negative GI ROS, Neg liver ROS,   Endo/Other  negative endocrine ROSdiabetes, Well ControlledHypothyroidism   Renal/GU negative Renal ROS  negative genitourinary   Musculoskeletal   Abdominal   Peds  Hematology negative hematology ROS (+)   Anesthesia Other Findings Past Medical History:   Hypertension                                                 Hypothyroidism                                               Diabetes mellitus without complication ([place])                 Anginal pain ([place])                                           Anxiety                                                    Past Surgical History:   FRACTURE SURGERY                                              APPENDECTOMY                                                  TONSILLECTOMY                                                 PERIPHERAL VASCULAR CATHETERIZATION             N/A 05/13/2015       Comment:Procedure: IVC Filter Removal;  Surgeon:               Katha Cabal, MD;  Location: [place]              CV LAB;  Service: Cardiovascular;  Laterality:  N/A; BMI    Body Mass Index   33.19 kg/m 2     Reproductive/Obstetrics                             Anesthesia Physical Anesthesia Plan  ASA: III  Anesthesia Plan: General    Post-op Pain Management:    Induction:   Airway Management Planned:   Additional Equipment:   Intra-op Plan:   Post-operative Plan:   Informed Consent: I have reviewed the patients History and Physical, chart, labs and discussed the procedure including the risks, benefits and alternatives for the proposed anesthesia with the patient or authorized representative who has indicated his/her understanding and acceptance.   Dental Advisory Given  Plan Discussed with: CRNA  Anesthesia Plan Comments:         Anesthesia Quick Evaluation

## 2016-01-22 LAB — SURGICAL PATHOLOGY

## 2016-02-04 ENCOUNTER — Ambulatory Visit
Admission: RE | Admit: 2016-02-04 | Discharge: 2016-02-04 | Disposition: A | Discharge status: Home / Self Care | Payer: Medicare Other | Source: Ambulatory Visit | Attending: Internal Medicine | Admitting: Internal Medicine

## 2016-02-04 DIAGNOSIS — Z1231 Encounter for screening mammogram for malignant neoplasm of breast: Secondary | ICD-10-CM | POA: Diagnosis present

## 2016-07-04 IMAGING — MG MM DIGITAL SCREENING BILAT W/ CAD
4 series · 4 of 4 positions shown · non-contrast
Comparison: Previous exam(s).

CLINICAL DATA: Screening.

EXAM:
DIGITAL SCREENING BILATERAL MAMMOGRAM WITH CAD

[R CC]
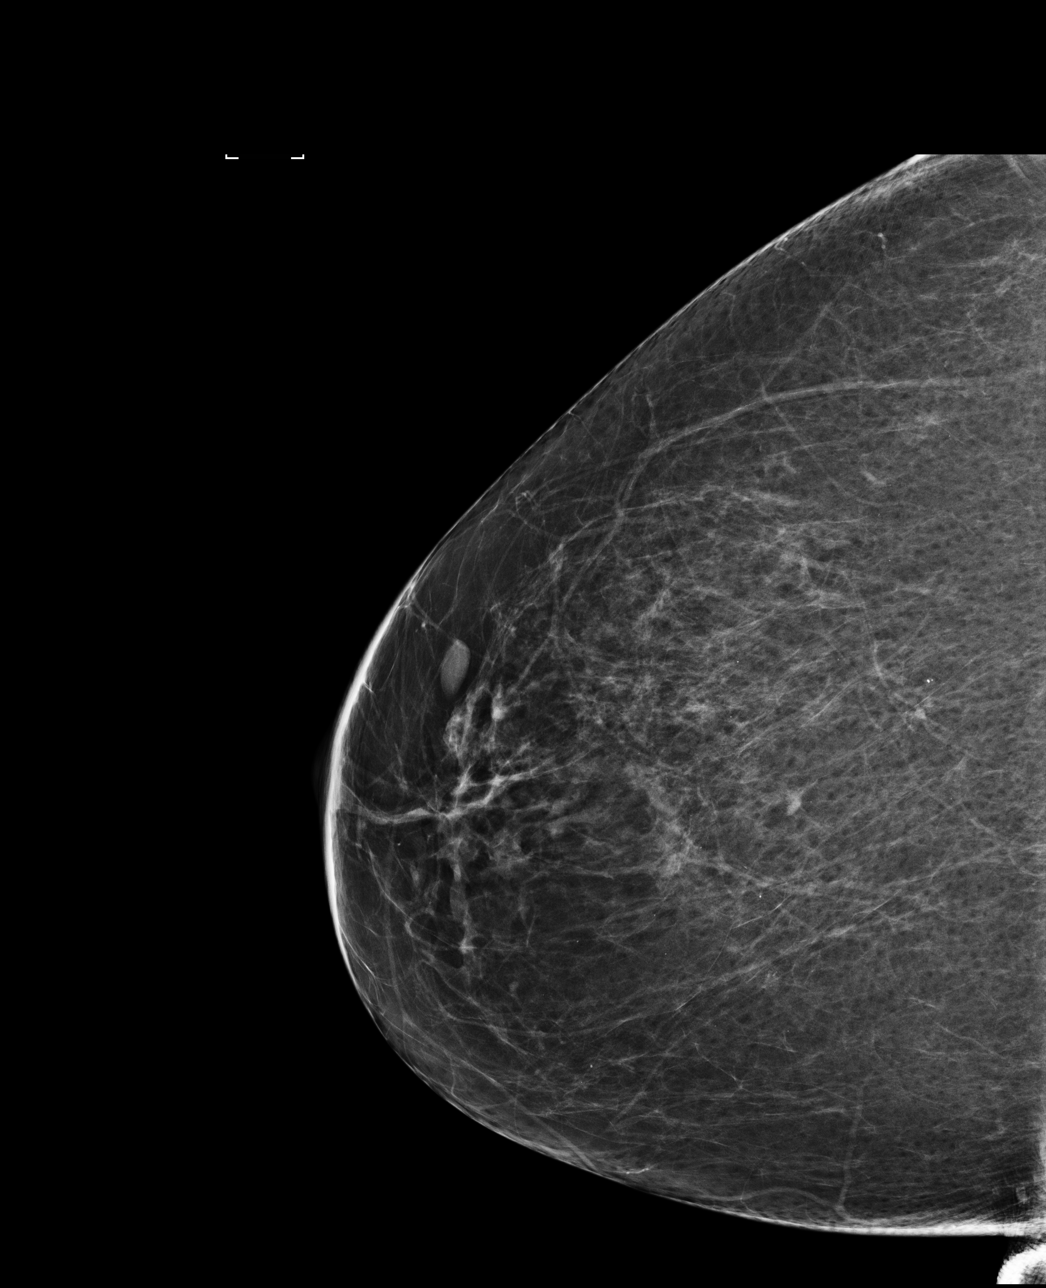

[L CC]
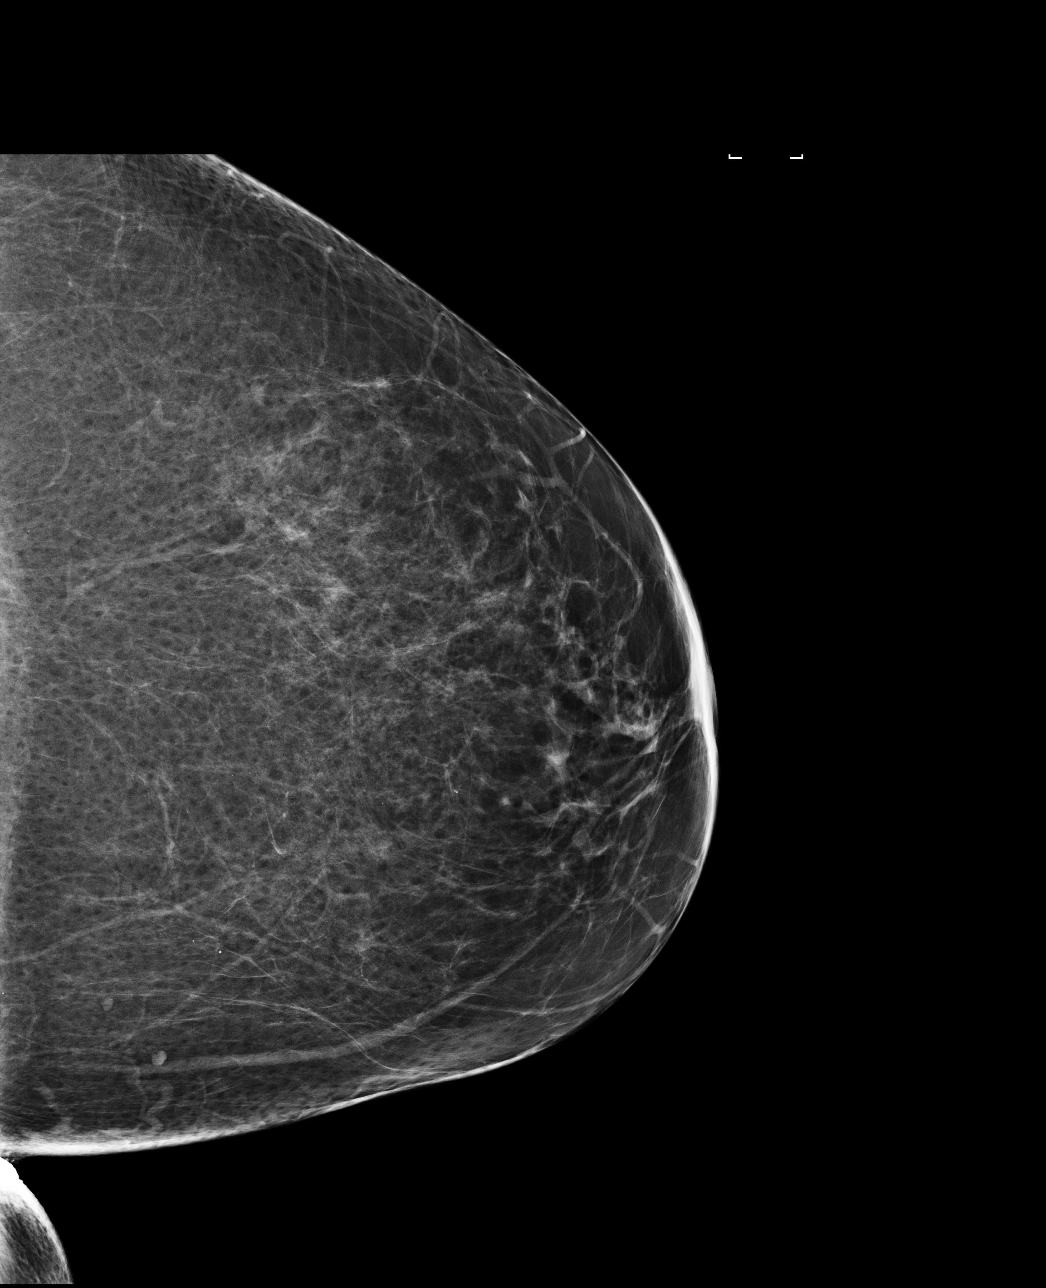

[R MLO]
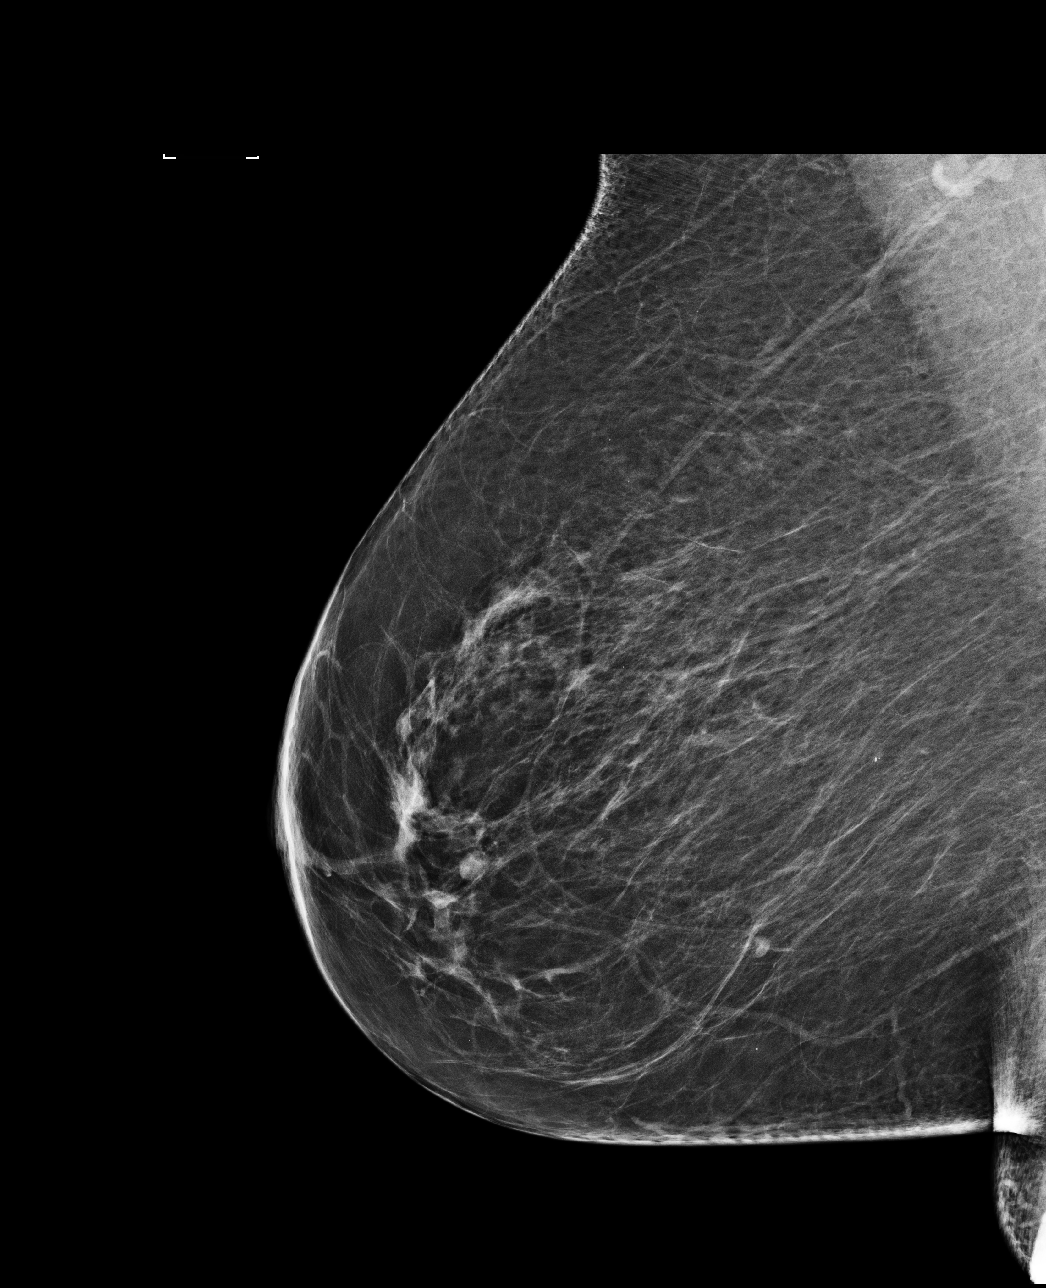

[L MLO]
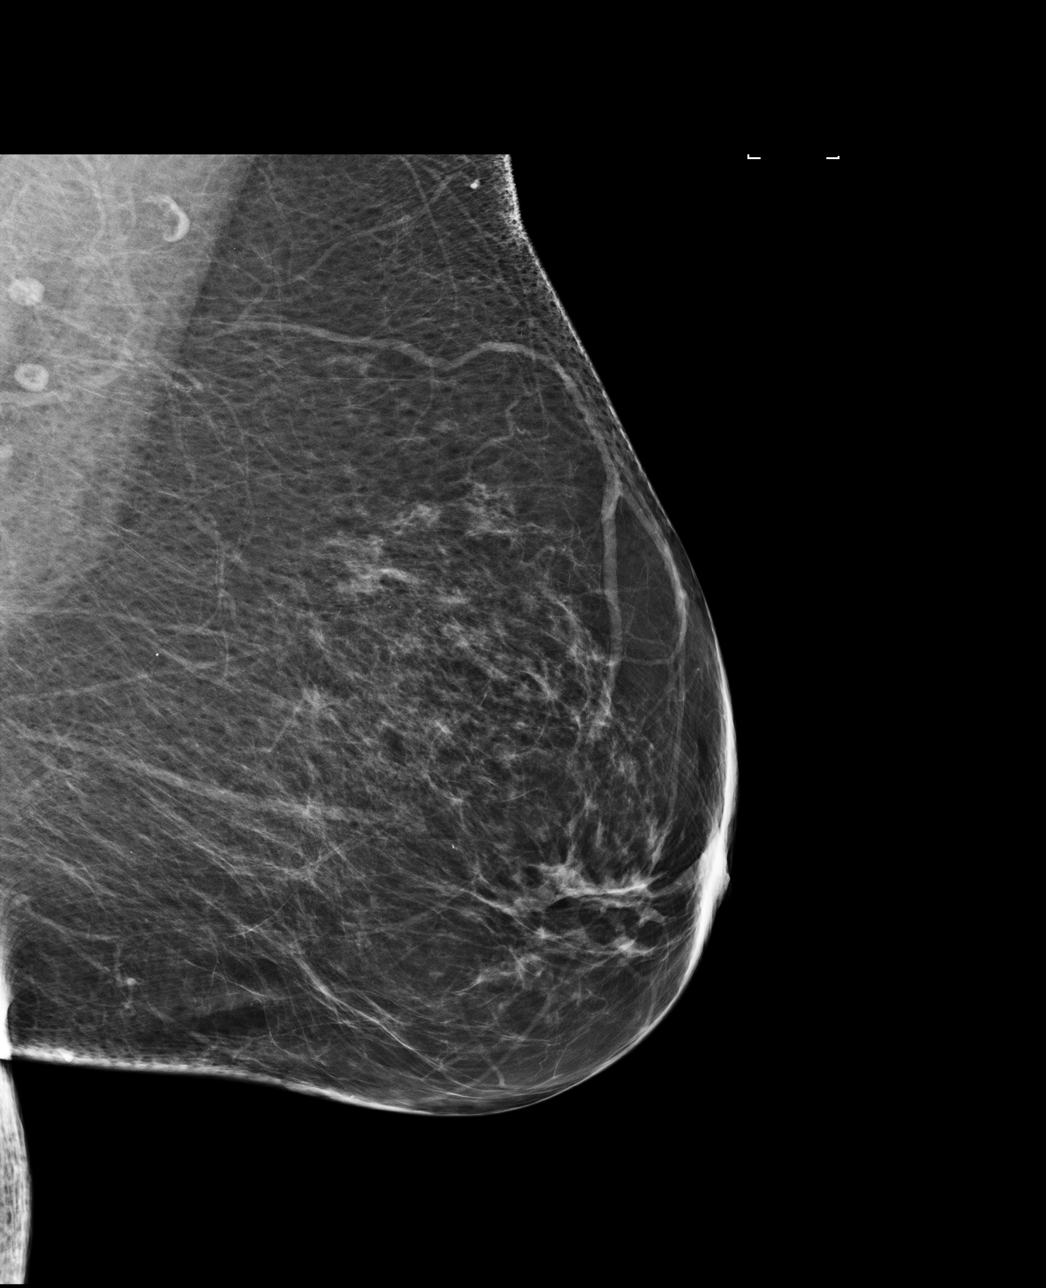

[4 of 4 positions shown; findings below may reference images not displayed]

ACR Breast Density Category b: There are scattered areas of
fibroglandular density.
FINDINGS: There are no findings suspicious for malignancy. Images were
processed with CAD.
IMPRESSION: No mammographic evidence of malignancy. A result letter of this
screening mammogram will be mailed directly to the patient.

RECOMMENDATION:
Screening mammogram in one year. (Code:AS-G-LCT)

BI-RADS CATEGORY  1: Negative.

## 2016-09-13 ENCOUNTER — Other Ambulatory Visit
Admission: RE | Admit: 2016-09-13 | Discharge: 2016-09-13 | Disposition: A | Discharge status: Home / Self Care | Payer: Medicare Other | Source: Ambulatory Visit | Attending: Gastroenterology | Admitting: Gastroenterology

## 2016-09-13 DIAGNOSIS — K52831 Collagenous colitis: Secondary | ICD-10-CM | POA: Insufficient documentation

## 2016-09-13 LAB — GASTROINTESTINAL PANEL BY PCR, STOOL (REPLACES STOOL CULTURE)

## 2016-09-13 LAB — C DIFFICILE QUICK SCREEN W PCR REFLEX
C Diff antigen: NEGATIVE
C Diff interpretation: NOT DETECTED
C Diff toxin: NEGATIVE

## 2016-10-22 DIAGNOSIS — S2220XA Unspecified fracture of sternum, initial encounter for closed fracture: Secondary | ICD-10-CM | POA: Insufficient documentation

## 2016-10-22 DIAGNOSIS — S2249XA Multiple fractures of ribs, unspecified side, initial encounter for closed fracture: Secondary | ICD-10-CM | POA: Insufficient documentation

## 2016-10-22 DIAGNOSIS — S3210XA Unspecified fracture of sacrum, initial encounter for closed fracture: Secondary | ICD-10-CM | POA: Insufficient documentation

## 2016-10-22 DIAGNOSIS — S42102A Fracture of unspecified part of scapula, left shoulder, initial encounter for closed fracture: Secondary | ICD-10-CM | POA: Insufficient documentation

## 2016-10-22 DIAGNOSIS — S32010A Wedge compression fracture of first lumbar vertebra, initial encounter for closed fracture: Secondary | ICD-10-CM | POA: Insufficient documentation

## 2016-11-02 DIAGNOSIS — D6851 Activated protein C resistance: Secondary | ICD-10-CM

## 2016-11-02 DIAGNOSIS — W19XXXA Unspecified fall, initial encounter: Secondary | ICD-10-CM | POA: Diagnosis not present

## 2016-11-02 DIAGNOSIS — E1121 Type 2 diabetes mellitus with diabetic nephropathy: Secondary | ICD-10-CM

## 2016-11-02 DIAGNOSIS — N183 Chronic kidney disease, stage 3 (moderate): Secondary | ICD-10-CM

## 2016-11-02 DIAGNOSIS — S2220XA Unspecified fracture of sternum, initial encounter for closed fracture: Secondary | ICD-10-CM

## 2016-11-02 DIAGNOSIS — S2243XA Multiple fractures of ribs, bilateral, initial encounter for closed fracture: Secondary | ICD-10-CM | POA: Diagnosis not present

## 2016-11-02 DIAGNOSIS — I10 Essential (primary) hypertension: Secondary | ICD-10-CM

## 2017-03-09 ENCOUNTER — Other Ambulatory Visit: Payer: Self-pay | Admitting: Internal Medicine

## 2017-03-09 DIAGNOSIS — Z1231 Encounter for screening mammogram for malignant neoplasm of breast: Secondary | ICD-10-CM

## 2017-04-08 ENCOUNTER — Emergency Department
Admission: EM | Admit: 2017-04-08 | Discharge: 2017-04-08 | Disposition: A | Discharge status: Home / Self Care | Payer: Medicare Other | Attending: Emergency Medicine | Admitting: Emergency Medicine

## 2017-04-08 ENCOUNTER — Encounter: Payer: Self-pay | Admitting: Emergency Medicine

## 2017-04-08 DIAGNOSIS — Z87891 Personal history of nicotine dependence: Secondary | ICD-10-CM | POA: Diagnosis not present

## 2017-04-08 DIAGNOSIS — R040 Epistaxis: Secondary | ICD-10-CM | POA: Insufficient documentation

## 2017-04-08 DIAGNOSIS — Z7984 Long term (current) use of oral hypoglycemic drugs: Secondary | ICD-10-CM | POA: Diagnosis not present

## 2017-04-08 DIAGNOSIS — Z79899 Other long term (current) drug therapy: Secondary | ICD-10-CM | POA: Insufficient documentation

## 2017-04-08 DIAGNOSIS — Z7901 Long term (current) use of anticoagulants: Secondary | ICD-10-CM | POA: Diagnosis not present

## 2017-04-08 DIAGNOSIS — I10 Essential (primary) hypertension: Secondary | ICD-10-CM | POA: Insufficient documentation

## 2017-04-08 DIAGNOSIS — E119 Type 2 diabetes mellitus without complications: Secondary | ICD-10-CM | POA: Insufficient documentation

## 2017-04-08 DIAGNOSIS — E039 Hypothyroidism, unspecified: Secondary | ICD-10-CM | POA: Insufficient documentation

## 2017-04-08 HISTORY — DX: Other primary thrombophilia: D68.59

## 2017-04-08 LAB — CBC
HCT: 44.5 % (ref 35.0–47.0)
Hemoglobin: 14.9 g/dL (ref 12.0–16.0)
MCH: 29.6 pg (ref 26.0–34.0)
MCHC: 33.6 g/dL (ref 32.0–36.0)
MCV: 88.1 fL (ref 80.0–100.0)
Platelets: 175 10*3/uL (ref 150–440)
RBC: 5.05 MIL/uL (ref 3.80–5.20)
RDW: 14 % (ref 11.5–14.5)
WBC: 6.8 10*3/uL (ref 3.6–11.0)

## 2017-04-08 LAB — PROTIME-INR
INR: 2.95
Prothrombin Time: 31.4 seconds — ABNORMAL HIGH (ref 11.4–15.2)

## 2017-04-08 NOTE — Discharge Instructions (Signed)
A few nose begins to bleed again please pinch it and leaned forward instead of leaning backwards and hold solid pressure for at least 15 minutes. If he continues to bleed and try this again for 15 minutes and if it does not resolve at that point, on into the emergency department for reevaluation. Otherwise please follow-up with your primary care physician as scheduled.  It was a pleasure to take care of you today, and thank you for coming to our emergency department.  If you have any questions or concerns before leaving please ask the nurse to grab me and I'm more than happy to go through your aftercare instructions again.  If you were prescribed any opioid pain medication today such as Norco, Vicodin, Percocet, morphine, hydrocodone, or oxycodone please make sure you do not drive when you are taking this medication as it can alter your ability to drive safely.  If you have any concerns once you are home that you are not improving or are in fact getting worse before you can make it to your follow-up appointment, please do not hesitate to call 911 and come back for further evaluation.  Darel Hong MD  Results for orders placed or performed during the hospital encounter of 04/08/17  CBC  Result Value Ref Range   WBC 6.8 3.6 - 11.0 K/uL   RBC 5.05 3.80 - 5.20 MIL/uL   Hemoglobin 14.9 12.0 - 16.0 g/dL   HCT 44.5 35.0 - 47.0 %   MCV 88.1 80.0 - 100.0 fL   MCH 29.6 26.0 - 34.0 pg   MCHC 33.6 32.0 - 36.0 g/dL   RDW 14.0 11.5 - 14.5 %   Platelets 175 150 - 440 K/uL  Protime-INR  Result Value Ref Range   Prothrombin Time 31.4 (H) 11.4 - 15.2 seconds   INR 2.95

## 2017-04-08 NOTE — ED Provider Notes (Signed)
[facility] Emergency Department Provider Note  ____________________________________________   First MD Initiated Contact with Patient 04/08/17 1341     (approximate)  I have reviewed the triage vital signs and the nursing notes.   HISTORY  Chief Complaint Epistaxis   HPI Sierra [name] is a 81 y.o. female who comes to the emergency department with 3 days of intermittent epistaxis. She takes Coumadin for protein C&S deficiency and her goal INR is between 2 and 3. She was on her way to clinic today to have her INR checked and she noted her nose began to bleed so she came here instead. She has no history of trauma. No history of easy bruising. No history of previous nosebleeds.   Past Medical History:  Diagnosis Date  . Anginal pain ([place])   . Anxiety   . Diabetes mellitus without complication ([place])   . Hypertension   . Hypothyroidism   . Protein C deficiency ([place])   . Protein S deficiency ([place])     There are no active problems to display for this patient.   Past Surgical History:  Procedure Laterality Date  . APPENDECTOMY    . BREAST BIOPSY    . COLONOSCOPY WITH PROPOFOL N/A 01/20/2016   Procedure: COLONOSCOPY WITH PROPOFOL;  Surgeon: Lollie Sails, MD;  Location: [facility] ENDOSCOPY;  Service: Endoscopy;  Laterality: N/A;  . FRACTURE SURGERY    . PERIPHERAL VASCULAR CATHETERIZATION N/A 05/13/2015   Procedure: IVC Filter Removal;  Surgeon: Katha Cabal, MD;  Location: [place] [facility];  Service: Cardiovascular;  Laterality: N/A;  . TONSILLECTOMY      Prior to Admission medications   Medication Sig Start Date End Date Taking? Authorizing Provider  atenolol (TENORMIN) 25 MG tablet Take 25 mg by mouth daily.    [provider]  atorvastatin (LIPITOR) 40 MG tablet Take 40 mg by mouth daily.    [provider]  budesonide (ENTOCORT EC) 3 MG 24 hr capsule Take 3 mg by mouth daily.    [provider]    clidinium-chlordiazePOXIDE (LIBRAX) 5-2.5 MG per capsule Take 1 capsule by mouth.    [provider]  diphenoxylate-atropine (LOMOTIL) 2.5-0.025 MG per tablet Take 1 tablet by mouth 4 (four) times daily as needed for diarrhea or loose stools.    [provider]  DULoxetine (CYMBALTA) 60 MG capsule Take 60 mg by mouth daily.    [provider]  levothyroxine (SYNTHROID, LEVOTHROID) 88 MCG tablet Take 88 mcg by mouth daily before breakfast.    [provider]  lisinopril (PRINIVIL,ZESTRIL) 20 MG tablet Take 20 mg by mouth daily.    [provider]  omeprazole (PRILOSEC) 40 MG capsule Take 40 mg by mouth daily.    [provider]  sitaGLIPtin-metformin (JANUMET) 50-500 MG per tablet Take 2 tablets by mouth 2 (two) times daily with a meal.    [provider]  warfarin (COUMADIN) 5 MG tablet Take 5 mg by mouth daily.    [provider]    Allergies Gentamicin  Family History  Problem Relation Age of Onset  . Breast cancer Sister     Social History Social History  Substance Use Topics  . Smoking status: Former Research scientist (life sciences)  . Smokeless tobacco: Never Used  . Alcohol use No    Review of Systems Constitutional: No fever/chills ENT: No sore throat. Cardiovascular: Denies chest pain. Respiratory: Denies shortness of breath. Gastrointestinal: No abdominal pain.  No nausea, no vomiting.  No  diarrhea.  No constipation. Musculoskeletal: Negative for back pain. Neurological: Negative for headaches   ____________________________________________   PHYSICAL EXAM:  VITAL SIGNS: ED Triage Vitals  Enc Vitals Group     BP 04/08/17 1027 (!) 152/91     Pulse Rate 04/08/17 1027 69     Resp 04/08/17 1027 18     Temp 04/08/17 1028 (!) 96.1 F (35.6 C)     Temp Source 04/08/17 1028 Axillary     SpO2 04/08/17 1027 98 %     Weight 04/08/17 1027 185 lb (83.9 kg)     Height 04/08/17 1027 5\' 7"  (1.702 m)     Head Circumference --       Peak Flow --      Pain Score --      Pain Loc --      Pain Edu? --      Excl. in [place]? --     Constitutional: Alert and oriented x 4 well appearing nontoxic no diaphoresis speaks in full, clear sentences Head: Atraumatic. Nose: Left-sided anterior epistaxis resolved Mouth/Throat: No trismus no bleeding through her mouth Neck: No stridor.   Cardiovascular: Regular rate and rhythm Respiratory: Normal respiratory effort.  No retractions. Gastrointestinal: Soft nontender Neurologic:  Normal speech and language. No gross focal neurologic deficits are appreciated.  Skin:  Skin is warm, dry and intact. No rash noted.    ____________________________________________  LABS (all labs ordered are listed, but only abnormal results are displayed)  Labs Reviewed  PROTIME-INR - Abnormal; Notable for the following:       Result Value   Prothrombin Time 31.4 (*)    All other components within normal limits  CBC    INR 2.95 is therapeutic __________________________________________  EKG   ____________________________________________  RADIOLOGY   ____________________________________________   PROCEDURES  Procedure(s) performed: no  Procedures  Critical Care performed: no  Observation: no ____________________________________________   INITIAL IMPRESSION / ASSESSMENT AND PLAN / ED COURSE  Pertinent labs & imaging results that were available during my care of the patient were reviewed by me and considered in my medical decision making (see chart for details).  By the time I saw the patient she had artery had a nasal clamp on for an hour and her bleeding was stopped. She does have evidence of anterior bleed with no evidence of posterior. Platelets are normal and her INR is elevated but therapeutic. Strict return precautions given.      ____________________________________________   FINAL CLINICAL IMPRESSION(S) / ED DIAGNOSES  Final diagnoses:  Acute anterior  epistaxis      NEW MEDICATIONS STARTED DURING THIS VISIT:  Discharge Medication List as of 04/08/2017  1:50 PM       Note:  This document was prepared using Dragon voice recognition software and may include unintentional dictation errors.      Darel Hong, MD 04/09/17 1228

## 2017-04-08 NOTE — ED Triage Notes (Addendum)
Pt c/o nose bleed that started 20 min ago. Clamp applied. Pt is on coumadin. This is 3rd nose bleed this week but has no history of nose bleed. No injury. Blood going posterior as well as anterior per pt. No posterior bleeding since clamp applied

## 2017-04-08 NOTE — ED Notes (Signed)
Pt alert and oriented X4, active, cooperative, pt in NAD. RR even and unlabored, color WNL.  Pt informed to return if any life threatening symptoms occur.   

## 2017-04-14 ENCOUNTER — Ambulatory Visit
Admission: RE | Admit: 2017-04-14 | Discharge: 2017-04-14 | Disposition: A | Discharge status: Home / Self Care | Payer: Medicare Other | Source: Ambulatory Visit | Attending: Internal Medicine | Admitting: Internal Medicine

## 2017-04-14 DIAGNOSIS — Z1231 Encounter for screening mammogram for malignant neoplasm of breast: Secondary | ICD-10-CM | POA: Diagnosis present

## 2017-04-17 ENCOUNTER — Encounter: Payer: Self-pay | Admitting: Emergency Medicine

## 2017-04-17 ENCOUNTER — Emergency Department
Admission: EM | Admit: 2017-04-17 | Discharge: 2017-04-17 | Disposition: A | Discharge status: Home / Self Care | Payer: Medicare Other | Attending: Emergency Medicine | Admitting: Emergency Medicine

## 2017-04-17 DIAGNOSIS — Z7901 Long term (current) use of anticoagulants: Secondary | ICD-10-CM | POA: Diagnosis not present

## 2017-04-17 DIAGNOSIS — R040 Epistaxis: Secondary | ICD-10-CM | POA: Diagnosis present

## 2017-04-17 DIAGNOSIS — Z7982 Long term (current) use of aspirin: Secondary | ICD-10-CM | POA: Insufficient documentation

## 2017-04-17 DIAGNOSIS — E039 Hypothyroidism, unspecified: Secondary | ICD-10-CM | POA: Insufficient documentation

## 2017-04-17 DIAGNOSIS — E119 Type 2 diabetes mellitus without complications: Secondary | ICD-10-CM | POA: Insufficient documentation

## 2017-04-17 DIAGNOSIS — Z79899 Other long term (current) drug therapy: Secondary | ICD-10-CM | POA: Insufficient documentation

## 2017-04-17 DIAGNOSIS — Z794 Long term (current) use of insulin: Secondary | ICD-10-CM | POA: Insufficient documentation

## 2017-04-17 LAB — PROTIME-INR
INR: 2.48
Prothrombin Time: 27.3 seconds — ABNORMAL HIGH (ref 11.4–15.2)

## 2017-04-17 MED ORDER — LISINOPRIL 10 MG PO TABS
20.0000 mg | ORAL_TABLET | Freq: Once | ORAL | Status: AC
  Administered 2017-04-17: 20 mg via ORAL
  Filled 2017-04-17: qty 2

## 2017-04-17 MED ORDER — ATENOLOL 25 MG PO TABS
25.0000 mg | ORAL_TABLET | Freq: Once | ORAL | Status: AC
  Administered 2017-04-17: 25 mg via ORAL
  Filled 2017-04-17: qty 1

## 2017-04-17 MED ORDER — OXYMETAZOLINE HCL 0.05 % NA SOLN
1.0000 | Freq: Once | NASAL | Status: AC
  Administered 2017-04-17: 1 via NASAL
  Filled 2017-04-17: qty 15

## 2017-04-17 NOTE — ED Triage Notes (Signed)
Pt to triage via [place], report nosebleed tonight.  Pt reports no hx of nosebleeds until about a week ago and has had several this past week.  Pt reports on coumadin.  Pt w/ clamp on in triage.

## 2017-04-17 NOTE — ED Notes (Signed)
Lab called and stated the blue top was not full and will need a redraw.  When I drew the tube the suction stopped before I pulled the tube out.

## 2017-04-17 NOTE — ED Provider Notes (Signed)
[facility] Emergency Department Provider Note   ____________________________________________   First MD Initiated Contact with Patient 04/17/17 [phone]     (approximate)  I have reviewed the triage vital signs and the nursing notes.   HISTORY  Chief Complaint Epistaxis    HPI Sierra [name] is a 81 y.o. female who comes into the hospital today with a severe nosebleed. She reports that it was coming out of both sides. She states it started around 10:20 PM and it wouldn't stop. It took an hour to stop and then the patient went to bed. She woke up again around 1245 with a nosebleed coming back. She states that this started about a week to 2 weeks ago and this is her fourth episode of bleeding. The patient reports that she's never had it before. The patient is on Coumadin and reports that the last time her INR was checked it was here and found to be 2.95 which is in her range. The patient denies any headache and reports that she hasn't changed her medication or done anything different to cause this bleed. The patient denies any nausea or vomiting. She is here today for evaluation.   Past Medical History:  Diagnosis Date  . Anginal pain ([place])   . Anxiety   . Diabetes mellitus without complication ([place])   . Hypertension   . Hypothyroidism   . Protein C deficiency ([place])   . Protein S deficiency ([place])     There are no active problems to display for this patient.   Past Surgical History:  Procedure Laterality Date  . APPENDECTOMY    . BREAST EXCISIONAL BIOPSY Left 2003?   benign  . COLONOSCOPY WITH PROPOFOL N/A 01/20/2016   Procedure: COLONOSCOPY WITH PROPOFOL;  Surgeon: Lollie Sails, MD;  Location: [facility] ENDOSCOPY;  Service: Endoscopy;  Laterality: N/A;  . FRACTURE SURGERY    . PERIPHERAL VASCULAR CATHETERIZATION N/A 05/13/2015   Procedure: IVC Filter Removal;  Surgeon: Katha Cabal, MD;  Location: [place] [facility] LAB;  Service: Cardiovascular;   Laterality: N/A;  . TONSILLECTOMY      Prior to Admission medications   Medication Sig Start Date End Date Taking? Authorizing Provider  atenolol (TENORMIN) 25 MG tablet Take 25 mg by mouth daily.    [provider]  atorvastatin (LIPITOR) 40 MG tablet Take 40 mg by mouth daily.    [provider]  budesonide (ENTOCORT EC) 3 MG 24 hr capsule Take 3 mg by mouth daily.    [provider]  clidinium-chlordiazePOXIDE (LIBRAX) 5-2.5 MG per capsule Take 1 capsule by mouth.    [provider]  diphenoxylate-atropine (LOMOTIL) 2.5-0.025 MG per tablet Take 1 tablet by mouth 4 (four) times daily as needed for diarrhea or loose stools.    [provider]  DULoxetine (CYMBALTA) 60 MG capsule Take 60 mg by mouth daily.    [provider]  levothyroxine (SYNTHROID, LEVOTHROID) 88 MCG tablet Take 88 mcg by mouth daily before breakfast.    [provider]  lisinopril (PRINIVIL,ZESTRIL) 20 MG tablet Take 20 mg by mouth daily.    [provider]  omeprazole (PRILOSEC) 40 MG capsule Take 40 mg by mouth daily.    [provider]  sitaGLIPtin-metformin (JANUMET) 50-500 MG per tablet Take 2 tablets by mouth 2 (two) times daily with a meal.    [provider]  warfarin (COUMADIN) 5 MG tablet Take 5 mg by mouth daily.    [provider]  Allergies Gentamicin  Family History  Problem Relation Age of Onset  . Breast cancer Sister     Social History Social History  Substance Use Topics  . Smoking status: Former Research scientist (life sciences)  . Smokeless tobacco: Never Used  . Alcohol use No    Review of Systems  Constitutional: No fever/chills Eyes: No visual changes. ENT: nose bleed. Cardiovascular: Denies chest pain. Respiratory: Denies shortness of breath. Gastrointestinal: No abdominal pain.  No nausea, no vomiting.  No diarrhea.  No constipation. Genitourinary: Negative for dysuria. Musculoskeletal: Negative for back  pain. Skin: Negative for rash. Neurological: Negative for headaches, focal weakness or numbness.   ____________________________________________   PHYSICAL EXAM:  VITAL SIGNS: ED Triage Vitals  Enc Vitals Group     BP 04/17/17 0140 (!) 155/104     Pulse Rate 04/17/17 0140 77     Resp 04/17/17 0140 16     Temp 04/17/17 0140 97.6 F (36.4 C)     Temp Source 04/17/17 0140 Oral     SpO2 04/17/17 0140 97 %     Weight 04/17/17 0141 185 lb (83.9 kg)     Height 04/17/17 0141 5\' 7"  (1.702 m)     Head Circumference --      Peak Flow --      Pain Score 04/17/17 0140 0     Pain Loc --      Pain Edu? --      Excl. in [place]? --     Constitutional: Alert and oriented. Well appearing and in no acute distress. Eyes: Conjunctivae are normal. PERRL. EOMI. Head: Atraumatic. Nose:Dried blood around the patient's nostrils with no signs of active bleeding. Mouth/Throat: Mucous membranes are moist.  Oropharynx non-erythematous. Cardiovascular: Normal rate, regular rhythm. Grossly normal heart sounds.  Good peripheral circulation. Respiratory: Normal respiratory effort.  No retractions. Lungs CTAB. Gastrointestinal: Soft and nontender. No distention.  Musculoskeletal: No lower extremity tenderness nor edema.   Neurologic:  Normal speech and language.  Skin:  Skin is warm, dry and intact.  Psychiatric: Mood and affect are normal. .  ____________________________________________   LABS (all labs ordered are listed, but only abnormal results are displayed)  Labs Reviewed  PROTIME-INR   ____________________________________________  EKG  none ____________________________________________  RADIOLOGY  No results found.  ____________________________________________   PROCEDURES  Procedure(s) performed: None  Procedures  Critical Care performed: No  ____________________________________________   INITIAL IMPRESSION / ASSESSMENT AND PLAN / ED COURSE  Pertinent labs & imaging  results that were available during my care of the patient were reviewed by me and considered in my medical decision making (see chart for details).  This is a 81 year old female who comes into the hospital today with a nosebleed. The patient started having a nosebleed earlier this evening and reports that it returned after she fell asleep. The patient had the clamp on her nose while she was in the waiting room and has been waiting for some time. At this time though the patient no longer has any bleeding. I will give the patient some Afrin as she did not receive any previously and I will also check the patient's INR. I will reassess the patient once I received her results.     The patient is still awaiting the result of her INR. Her care will be signed out to Dr. Reita Cliche who will evaluate the patient after she received the results of her INR and disposition the patient.  ____________________________________________   FINAL CLINICAL IMPRESSION(S) / ED DIAGNOSES  Final  diagnoses:  Epistaxis      NEW MEDICATIONS STARTED DURING THIS VISIT:  New Prescriptions   No medications on file     Note:  This document was prepared using Dragon voice recognition software and may include unintentional dictation errors.    Loney Hering, MD 04/17/17 [phone]

## 2017-04-17 NOTE — ED Notes (Signed)
Pt verbalized understanding of discharge instructions. NAD at this time. 

## 2017-04-17 NOTE — Discharge Instructions (Signed)
You were evaluated for another nosebleed, which is now stopped. As we discussed, your warfarin INR was 2.4, which is therapeutic, however given multiple nosebleeds over the past 10 days, we discussed stopping the warfarin for about 4 days and then restarting in order to help the body scab over and heal from the nosebleeds.  Please follow up with your ENT physician as well as her primary care doctor.  Return to the emergency room immediately for any uncontrolled nosebleed, dizziness, passing out, or any symptoms of blood clot to the leg or any trouble breathing in terms of concern about blood clot to the lung.

## 2017-04-17 NOTE — ED Notes (Signed)
Pt still co epistaxis unchanged. No pain. Pt VS updated and pt made aware of waiting room status

## 2017-04-17 NOTE — ED Provider Notes (Signed)
[facility]  I accepted care from Dr. Dahlia Client ____________________________________________    LABS (pertinent positives/negatives)  Labs Reviewed  PROTIME-INR - Abnormal; Notable for the following:       Result Value   Prothrombin Time 27.3 (*)    All other components within normal limits     ____________________________________________    RADIOLOGY All xrays were viewed by me. Imaging interpreted by radiologist.  None  ____________________________________________   PROCEDURES  Procedure(s) performed: None  Critical Care performed: None  ____________________________________________   INITIAL IMPRESSION / ASSESSMENT AND PLAN / ED COURSE   Pertinent labs & imaging results that were available during my care of the patient were reviewed by me and considered in my medical decision making (see chart for details).  INR 2.4.  No additional nosebleeding here in the emergency department. However she states that she's had for bad nosebleeds over the past 10 days. We discussed risks versus benefit of holding off on the warfarin for couple days, and she feels comfortable doing this in order to aid healing from the nosebleed. She also follows with Dr. Richardson Landry with ENT and will make a follow-up appointment there this week. She will also follow up with her primary care physician.  CONSULTATIONS: None    Patient / Family / Caregiver informed of clinical course, medical decision-making process, and agree with plan.   I discussed return precautions, follow-up instructions, and discharged instructions with patient and/or family.   Discharge instructions:   You were evaluated for another nosebleed, which is now stopped. As we discussed, your warfarin INR was 2.4, which is therapeutic, however given multiple nosebleeds over the past 10 days, we discussed stopping the warfarin for about 4 days and then restarting in order to help the body scab over and heal from  the nosebleeds.  Please follow up with your ENT physician as well as her primary care doctor.  Return to the emergency room immediately for any uncontrolled nosebleed, dizziness, passing out, or any symptoms of blood clot to the leg or any trouble breathing in terms of concern about blood clot to the lung.   ____________________________________________   FINAL CLINICAL IMPRESSION(S) / ED DIAGNOSES  Final diagnoses:  Epistaxis        Lisa Roca, MD 04/17/17 1015

## 2017-04-25 ENCOUNTER — Other Ambulatory Visit: Payer: Self-pay | Admitting: Internal Medicine

## 2017-04-25 DIAGNOSIS — R928 Other abnormal and inconclusive findings on diagnostic imaging of breast: Secondary | ICD-10-CM

## 2017-04-25 DIAGNOSIS — N6310 Unspecified lump in the right breast, unspecified quadrant: Secondary | ICD-10-CM

## 2017-05-02 ENCOUNTER — Ambulatory Visit
Admission: RE | Admit: 2017-05-02 | Discharge: 2017-05-02 | Disposition: A | Discharge status: Home / Self Care | Payer: Medicare Other | Source: Ambulatory Visit | Attending: Internal Medicine | Admitting: Internal Medicine

## 2017-05-02 ENCOUNTER — Other Ambulatory Visit: Payer: Self-pay | Admitting: Internal Medicine

## 2017-05-02 DIAGNOSIS — N6001 Solitary cyst of right breast: Secondary | ICD-10-CM | POA: Insufficient documentation

## 2017-05-02 DIAGNOSIS — N6310 Unspecified lump in the right breast, unspecified quadrant: Secondary | ICD-10-CM

## 2017-05-02 DIAGNOSIS — R928 Other abnormal and inconclusive findings on diagnostic imaging of breast: Secondary | ICD-10-CM

## 2017-05-02 DIAGNOSIS — N6311 Unspecified lump in the right breast, upper outer quadrant: Secondary | ICD-10-CM | POA: Insufficient documentation

## 2017-05-27 ENCOUNTER — Ambulatory Visit
Admission: RE | Admit: 2017-05-27 | Discharge: 2017-05-27 | Disposition: A | Discharge status: Home / Self Care | Payer: MEDICARE | Attending: Physical Medicine & Rehabilitation

## 2017-05-27 ENCOUNTER — Ambulatory Visit
Admission: RE | Admit: 2017-05-27 | Discharge: 2017-05-27 | Disposition: A | Discharge status: Home / Self Care | Payer: MEDICARE

## 2017-05-27 DIAGNOSIS — M5417 Radiculopathy, lumbosacral region: Principal | ICD-10-CM

## 2017-05-27 DIAGNOSIS — M5416 Radiculopathy, lumbar region: Principal | ICD-10-CM

## 2017-06-22 ENCOUNTER — Ambulatory Visit
Admission: RE | Admit: 2017-06-22 | Discharge: 2017-06-22 | Disposition: A | Discharge status: Home / Self Care | Payer: MEDICARE

## 2017-06-22 DIAGNOSIS — M545 Low back pain: Principal | ICD-10-CM

## 2017-06-28 ENCOUNTER — Ambulatory Visit
Admission: RE | Admit: 2017-06-28 | Discharge: 2017-06-28 | Disposition: A | Discharge status: Home / Self Care | Payer: MEDICARE

## 2017-06-28 ENCOUNTER — Ambulatory Visit
Admission: RE | Admit: 2017-06-28 | Discharge: 2017-06-28 | Disposition: A | Discharge status: Home / Self Care | Payer: MEDICARE | Attending: Physical Medicine & Rehabilitation | Admitting: Physical Medicine & Rehabilitation

## 2017-06-28 DIAGNOSIS — M545 Low back pain: Principal | ICD-10-CM

## 2017-06-28 DIAGNOSIS — M5417 Radiculopathy, lumbosacral region: Principal | ICD-10-CM

## 2017-07-17 DIAGNOSIS — N182 Chronic kidney disease, stage 2 (mild): Secondary | ICD-10-CM | POA: Insufficient documentation

## 2017-07-20 ENCOUNTER — Ambulatory Visit
Admission: RE | Admit: 2017-07-20 | Discharge: 2017-07-20 | Disposition: A | Discharge status: Home / Self Care | Payer: MEDICARE

## 2017-07-20 DIAGNOSIS — E669 Obesity, unspecified: Secondary | ICD-10-CM

## 2017-07-20 DIAGNOSIS — D539 Nutritional anemia, unspecified: Secondary | ICD-10-CM

## 2017-07-20 DIAGNOSIS — R5383 Other fatigue: Secondary | ICD-10-CM

## 2017-07-20 DIAGNOSIS — Z8739 Personal history of other diseases of the musculoskeletal system and connective tissue: Secondary | ICD-10-CM

## 2017-07-20 DIAGNOSIS — M8000XD Age-related osteoporosis with current pathological fracture, unspecified site, subsequent encounter for fracture with routine healing: Principal | ICD-10-CM

## 2017-07-20 DIAGNOSIS — M810 Age-related osteoporosis without current pathological fracture: Secondary | ICD-10-CM

## 2017-08-11 ENCOUNTER — Ambulatory Visit
Admission: RE | Admit: 2017-08-11 | Discharge: 2017-08-11 | Disposition: A | Discharge status: Home / Self Care | Payer: MEDICARE

## 2017-08-11 ENCOUNTER — Ambulatory Visit
Admission: RE | Admit: 2017-08-11 | Discharge: 2017-08-11 | Disposition: A | Discharge status: Home / Self Care | Payer: MEDICARE | Attending: Physical Medicine & Rehabilitation

## 2017-08-11 DIAGNOSIS — M8000XD Age-related osteoporosis with current pathological fracture, unspecified site, subsequent encounter for fracture with routine healing: Principal | ICD-10-CM

## 2017-08-11 DIAGNOSIS — M810 Age-related osteoporosis without current pathological fracture: Secondary | ICD-10-CM

## 2017-08-11 DIAGNOSIS — M5417 Radiculopathy, lumbosacral region: Principal | ICD-10-CM

## 2017-08-31 ENCOUNTER — Ambulatory Visit
Admission: RE | Admit: 2017-08-31 | Discharge: 2017-08-31 | Disposition: A | Discharge status: Home / Self Care | Payer: MEDICARE

## 2017-08-31 DIAGNOSIS — M5416 Radiculopathy, lumbar region: Principal | ICD-10-CM

## 2017-09-12 ENCOUNTER — Ambulatory Visit
Admission: RE | Admit: 2017-09-12 | Discharge: 2017-09-12 | Disposition: A | Discharge status: Home / Self Care | Payer: Medicare Other | Source: Ambulatory Visit | Attending: Gastroenterology | Admitting: Gastroenterology

## 2017-09-12 ENCOUNTER — Encounter
Admission: RE | Disposition: A | Discharge status: Home / Self Care | Payer: Self-pay | Source: Ambulatory Visit | Attending: Gastroenterology

## 2017-09-12 ENCOUNTER — Encounter: Payer: Self-pay | Admitting: *Deleted

## 2017-09-12 ENCOUNTER — Ambulatory Visit: Payer: Medicare Other | Admitting: Certified Registered Nurse Anesthetist

## 2017-09-12 DIAGNOSIS — I10 Essential (primary) hypertension: Secondary | ICD-10-CM | POA: Diagnosis not present

## 2017-09-12 DIAGNOSIS — F419 Anxiety disorder, unspecified: Secondary | ICD-10-CM | POA: Insufficient documentation

## 2017-09-12 DIAGNOSIS — Z7901 Long term (current) use of anticoagulants: Secondary | ICD-10-CM | POA: Insufficient documentation

## 2017-09-12 DIAGNOSIS — Z79899 Other long term (current) drug therapy: Secondary | ICD-10-CM | POA: Diagnosis not present

## 2017-09-12 DIAGNOSIS — K6389 Other specified diseases of intestine: Secondary | ICD-10-CM | POA: Diagnosis not present

## 2017-09-12 DIAGNOSIS — K5730 Diverticulosis of large intestine without perforation or abscess without bleeding: Secondary | ICD-10-CM | POA: Diagnosis not present

## 2017-09-12 DIAGNOSIS — E039 Hypothyroidism, unspecified: Secondary | ICD-10-CM | POA: Insufficient documentation

## 2017-09-12 DIAGNOSIS — K52831 Collagenous colitis: Secondary | ICD-10-CM | POA: Diagnosis not present

## 2017-09-12 DIAGNOSIS — Z7984 Long term (current) use of oral hypoglycemic drugs: Secondary | ICD-10-CM | POA: Diagnosis not present

## 2017-09-12 DIAGNOSIS — Z87891 Personal history of nicotine dependence: Secondary | ICD-10-CM | POA: Diagnosis not present

## 2017-09-12 DIAGNOSIS — K529 Noninfective gastroenteritis and colitis, unspecified: Secondary | ICD-10-CM | POA: Diagnosis present

## 2017-09-12 DIAGNOSIS — E119 Type 2 diabetes mellitus without complications: Secondary | ICD-10-CM | POA: Insufficient documentation

## 2017-09-12 HISTORY — PX: COLONOSCOPY: SHX5424

## 2017-09-12 LAB — PROTIME-INR
INR: 1.22
Prothrombin Time: 15.3 seconds — ABNORMAL HIGH (ref 11.4–15.2)

## 2017-09-12 LAB — GLUCOSE, CAPILLARY: Glucose-Capillary: 186 mg/dL — ABNORMAL HIGH (ref 65–99)

## 2017-09-12 SURGERY — COLONOSCOPY
Anesthesia: General

## 2017-09-12 MED ORDER — SODIUM CHLORIDE 0.9 % IV SOLN
INTRAVENOUS | Status: DC
  Administered 2017-09-12: 1000 mL via INTRAVENOUS

## 2017-09-12 MED ORDER — SUCCINYLCHOLINE CHLORIDE 20 MG/ML IJ SOLN
INTRAMUSCULAR | Status: AC
  Filled 2017-09-12: qty 1

## 2017-09-12 MED ORDER — LIDOCAINE HCL (PF) 2 % IJ SOLN
INTRAMUSCULAR | Status: AC
  Filled 2017-09-12: qty 10

## 2017-09-12 MED ORDER — SODIUM CHLORIDE 0.9 % IV SOLN
INTRAVENOUS | Status: DC
  Administered 2017-09-12: 08:00:00 via INTRAVENOUS

## 2017-09-12 MED ORDER — LIDOCAINE HCL (CARDIAC) 20 MG/ML IV SOLN
INTRAVENOUS | Status: DC | PRN
  Administered 2017-09-12: 100 mg via INTRAVENOUS

## 2017-09-12 MED ORDER — PHENYLEPHRINE HCL 10 MG/ML IJ SOLN
INTRAMUSCULAR | Status: AC
  Filled 2017-09-12: qty 1

## 2017-09-12 MED ORDER — GLYCOPYRROLATE 0.2 MG/ML IJ SOLN
INTRAMUSCULAR | Status: AC
  Filled 2017-09-12: qty 1

## 2017-09-12 MED ORDER — PROPOFOL 500 MG/50ML IV EMUL
INTRAVENOUS | Status: DC | PRN
  Administered 2017-09-12: 150 ug/kg/min via INTRAVENOUS

## 2017-09-12 MED ORDER — PROPOFOL 10 MG/ML IV BOLUS
INTRAVENOUS | Status: DC | PRN
  Administered 2017-09-12: 40 mg via INTRAVENOUS
  Administered 2017-09-12: 20 mg via INTRAVENOUS

## 2017-09-12 MED ORDER — EPHEDRINE SULFATE 50 MG/ML IJ SOLN
INTRAMUSCULAR | Status: AC
  Filled 2017-09-12: qty 1

## 2017-09-12 MED ORDER — PROPOFOL 10 MG/ML IV BOLUS
INTRAVENOUS | Status: AC
  Filled 2017-09-12: qty 20

## 2017-09-12 MED ORDER — PROPOFOL 500 MG/50ML IV EMUL
INTRAVENOUS | Status: AC
  Filled 2017-09-12: qty 50

## 2017-09-12 NOTE — Op Note (Signed)
[facility] Gastroenterology Patient Name: Sierra [name] Procedure Date: 09/12/2017 8:47 AM MRN: [id number] Account #: [id number] Date of Birth: 1936/01/31 Admit Type: Outpatient Age: 81 Room: [facility] ENDO ROOM 3 Gender: Female Note Status: Finalized Procedure:            Colonoscopy Indications:          Chronic diarrhea, history of collagenous/microcytic                        colitis Providers:            Lollie Sails, MD Referring MD:         Glendon Axe (Referring MD) Medicines:            Monitored Anesthesia Care Complications:        No immediate complications. Procedure:            Pre-Anesthesia Assessment:                       - ASA Grade Assessment: III - A patient with severe                        systemic disease.                       After obtaining informed consent, the colonoscope was                        passed under direct vision. Throughout the procedure,                        the patient's blood pressure, pulse, and oxygen                        saturations were monitored continuously. The                        Colonoscope was introduced through the anus and                        advanced to the the cecum, identified by appendiceal                        orifice and ileocecal valve. The colonoscopy was                        performed without difficulty. The patient tolerated the                        procedure well. The quality of the bowel preparation                        was good. Findings:      Multiple medium-mouthed diverticula were found in the sigmoid colon,       descending colon, transverse colon and ascending colon.      A diffuse and patchy area of mild granular mucosa was found in the       entire colon. Biopsies for histology were taken with a cold forceps from       the right colon and left colon for evaluation of microscopic colitis.       Biopsies for  histology were taken with a cold forceps for evaluation of        microscopic colitis.      The exam was otherwise normal throughout the examined colon.      The retroflexed view of the distal rectum and anal verge was normal and       showed no anal or rectal abnormalities.      The digital rectal exam was normal. Impression:           - Diverticulosis in the sigmoid colon, in the                        descending colon, in the transverse colon and in the                        ascending colon.                       - Granularity in the entire examined colon. Biopsied.                       - The distal rectum and anal verge are normal on                        retroflexion view. Recommendation:       - Discharge patient to home.                       - Continue present medications.                       - Telephone GI clinic for pathology results in 1 week. Procedure Code(s):    --- Professional ---                       [phone], Colonoscopy, flexible; with biopsy, single or                        multiple Diagnosis Code(s):    --- Professional ---                       K63.89, Other specified diseases of intestine                       K52.9, Noninfective gastroenteritis and colitis,                        unspecified                       K57.30, Diverticulosis of large intestine without                        perforation or abscess without bleeding CPT copyright 2016 [employer]. All rights reserved. The codes documented in this report are preliminary and upon coder review may  be revised to meet current compliance requirements. Lollie Sails, MD 09/12/2017 9:21:13 AM This report has been signed electronically. Number of Addenda: 0 Note Initiated On: 09/12/2017 8:47 AM Scope Withdrawal Time: 0 hours 8 minutes 57 seconds  Total Procedure Duration: 0 hours 21 minutes 5 seconds       [facility]

## 2017-09-12 NOTE — H&P (Signed)
Outpatient short stay form Pre-procedure 09/12/2017 8:03 AM Lollie Sails MD  Primary Physician: Dr. Glendon Axe  Reason for visit:  Colonoscopy  History of present illness:  Patient is a 81 year old female with a known history of collagenous colitis. She's had some variability and response to treatment although seems to be improved on combination of loperamide and 5-ASA. She has probably 3 or so bowel movements a day that are more formed not running. She does take Coumadin and we are checking a pro time today. She is held that appropriately. She takes no other blood thinning agents or aspirin. She tolerated her prep well.    Current Facility-Administered Medications:  .  0.9 %  sodium chloride infusion, , Intravenous, Continuous, Jonathon Bellows, MD, Last Rate: 20 mL/hr at 09/12/17 0757, 1,000 mL at 09/12/17 0757 .  0.9 %  sodium chloride infusion, , Intravenous, Continuous, Lollie Sails, MD  Medications Prior to Admission  Medication Sig Dispense Refill Last Dose  . atenolol (TENORMIN) 25 MG tablet Take 25 mg by mouth daily.   09/12/2017 at Unknown time  . atorvastatin (LIPITOR) 40 MG tablet Take 40 mg by mouth daily.   09/11/2017 at Unknown time  . diphenoxylate-atropine (LOMOTIL) 2.5-0.025 MG per tablet Take 1 tablet by mouth 4 (four) times daily as needed for diarrhea or loose stools.   Past Week at Unknown time  . DULoxetine (CYMBALTA) 60 MG capsule Take 60 mg by mouth daily.   09/11/2017 at Unknown time  . levothyroxine (SYNTHROID, LEVOTHROID) 88 MCG tablet Take 88 mcg by mouth daily before breakfast.   09/12/2017 at Unknown time  . lisinopril (PRINIVIL,ZESTRIL) 20 MG tablet Take 20 mg by mouth daily.   09/12/2017 at Unknown time  . sitaGLIPtin-metformin (JANUMET) 50-500 MG per tablet Take 2 tablets by mouth 2 (two) times daily with a meal.   Past Week at Unknown time  . warfarin (COUMADIN) 5 MG tablet Take 5 mg by mouth daily.   Past Week at Unknown time  . budesonide (ENTOCORT  EC) 3 MG 24 hr capsule Take 3 mg by mouth daily.   Not Taking at Unknown time  . clidinium-chlordiazePOXIDE (LIBRAX) 5-2.5 MG per capsule Take 1 capsule by mouth.   Not Taking at Unknown time  . omeprazole (PRILOSEC) 40 MG capsule Take 40 mg by mouth daily.   Not Taking at Unknown time     Allergies  Allergen Reactions  . Gentamicin Other (See Comments)    Kidney function     Past Medical History:  Diagnosis Date  . Anginal pain ([place])   . Anxiety   . Diabetes mellitus without complication ([place])   . Hypertension   . Hypothyroidism   . Protein C deficiency ([place])   . Protein S deficiency ([place])     Review of systems:      Physical Exam    Heart and lungs: Regular rate and rhythm without rub or gallop, lungs are bilaterally clear.    HEENT: Normocephalic atraumatic eyes are anicteric    Other:     Pertinant exam for procedure: Soft nontender nondistended bowel sounds positive normoactive    Planned proceedures: Colonoscopy and indicated procedures. I have discussed the risks benefits and complications of procedures to include not limited to bleeding, infection, perforation and the risk of sedation and the patient wishes to proceed.    Lollie Sails, MD Gastroenterology 09/12/2017  8:03 AM

## 2017-09-12 NOTE — Anesthesia Procedure Notes (Signed)
Date/Time: 09/12/2017 8:53 AM Performed by: Darlyne Russian, CRNA Pre-anesthesia Checklist: Patient identified, Emergency Drugs available, Suction available, Patient being monitored and Timeout performed Patient Re-evaluated:Patient Re-evaluated prior to induction Oxygen Delivery Method: Nasal cannula Induction Type: IV induction Ventilation: Oral airway inserted - appropriate to patient size Placement Confirmation: positive ETCO2

## 2017-09-12 NOTE — Transfer of Care (Signed)
Immediate Anesthesia Transfer of Care Note  Patient: Sierra [name]  Procedure(s) Performed: COLONOSCOPY (N/A )  Patient Location: PACU  Anesthesia Type:General  Level of Consciousness: drowsy and patient cooperative  Airway & Oxygen Therapy: Patient Spontanous Breathing and Patient connected to nasal cannula oxygen  Post-op Assessment: Report given to RN and Post -op Vital signs reviewed and stable  Post vital signs: Reviewed and stable  Last Vitals:  Vitals:   09/12/17 0739 09/12/17 0920  BP: (!) 150/83 123/67  Pulse: 70 64  Resp: 18 14  Temp: (!) 36.3 C (!) 36.2 C  SpO2: 98% 97%    Last Pain:  Vitals:   09/12/17 0920  TempSrc: Tympanic  PainSc: Asleep         Complications: No apparent anesthesia complications

## 2017-09-12 NOTE — Anesthesia Preprocedure Evaluation (Addendum)
Anesthesia Evaluation  Patient identified by MRN, date of birth, ID band Patient awake    Reviewed: Allergy & Precautions, NPO status , Patient's Chart, lab work & pertinent test results  History of Anesthesia Complications Negative for: history of anesthetic complications  Airway Mallampati: III  TM Distance: >3 FB Neck ROM: Full    Dental no notable dental hx.    Pulmonary neg sleep apnea, neg COPD, former smoker,    breath sounds clear to auscultation- rhonchi (-) wheezing      Cardiovascular hypertension, Pt. on medications (-) Past MI, (-) Cardiac Stents and (-) CABG  Rhythm:Regular Rate:Normal - Systolic murmurs and - Diastolic murmurs    Neuro/Psych Anxiety negative neurological ROS     GI/Hepatic negative GI ROS, Neg liver ROS,   Endo/Other  diabetes, Oral Hypoglycemic AgentsHypothyroidism   Renal/GU negative Renal ROS     Musculoskeletal negative musculoskeletal ROS (+)   Abdominal (+) + obese,   Peds  Hematology negative hematology ROS (+)   Anesthesia Other Findings Past Medical History: No date: Anginal pain ([place]) No date: Anxiety No date: Diabetes mellitus without complication ([name]) No date: Hypertension No date: Hypothyroidism No date: Protein C deficiency ([name]) No date: Protein S deficiency ([name])   Reproductive/Obstetrics                             Anesthesia Physical Anesthesia Plan  ASA: III  Anesthesia Plan: General   Post-op Pain Management:    Induction: Intravenous  PONV Risk Score and Plan: 2 and Propofol infusion  Airway Management Planned: Natural Airway  Additional Equipment:   Intra-op Plan:   Post-operative Plan:   Informed Consent: I have reviewed the patients History and Physical, chart, labs and discussed the procedure including the risks, benefits and alternatives for the proposed anesthesia with the patient or authorized representative  who has indicated his/her understanding and acceptance.   Dental advisory given  Plan Discussed with: CRNA and Anesthesiologist  Anesthesia Plan Comments:        Anesthesia Quick Evaluation

## 2017-09-12 NOTE — Anesthesia Postprocedure Evaluation (Signed)
Anesthesia Post Note  Patient: [facility]  Procedure(s) Performed: COLONOSCOPY (N/A )  Patient location during evaluation: Endoscopy Anesthesia Type: General Level of consciousness: awake and alert and oriented Pain management: pain level controlled Vital Signs Assessment: post-procedure vital signs reviewed and stable Respiratory status: spontaneous breathing, nonlabored ventilation and respiratory function stable Cardiovascular status: blood pressure returned to baseline and stable Postop Assessment: no signs of nausea or vomiting Anesthetic complications: no     Last Vitals:  Vitals:   09/12/17 0940 09/12/17 0950  BP: 107/84 122/88  Pulse: (!) 59 (!) 57  Resp: 17 12  Temp:    SpO2: 100% 100%    Last Pain:  Vitals:   09/12/17 0920  TempSrc: Tympanic  PainSc: Asleep                 Kamalei Roeder

## 2017-09-12 NOTE — Anesthesia Post-op Follow-up Note (Signed)
Anesthesia QCDR form completed.        

## 2017-09-13 LAB — SURGICAL PATHOLOGY

## 2017-09-14 ENCOUNTER — Ambulatory Visit
Admission: RE | Admit: 2017-09-14 | Discharge: 2017-09-14 | Disposition: A | Discharge status: Home / Self Care | Payer: MEDICARE | Attending: Internal Medicine | Admitting: Internal Medicine

## 2017-09-14 DIAGNOSIS — M8000XD Age-related osteoporosis with current pathological fracture, unspecified site, subsequent encounter for fracture with routine healing: Principal | ICD-10-CM

## 2017-09-14 DIAGNOSIS — M810 Age-related osteoporosis without current pathological fracture: Secondary | ICD-10-CM

## 2017-09-14 MED ORDER — ALENDRONATE 70 MG TABLET
ORAL_TABLET | ORAL | 3 refills | 0.00000 days | Status: CP
Start: 2017-09-14 — End: 2018-06-20

## 2017-09-16 DIAGNOSIS — M8000XA Age-related osteoporosis with current pathological fracture, unspecified site, initial encounter for fracture: Secondary | ICD-10-CM | POA: Insufficient documentation

## 2017-09-16 DIAGNOSIS — M810 Age-related osteoporosis without current pathological fracture: Secondary | ICD-10-CM | POA: Insufficient documentation

## 2017-10-26 MED ORDER — HYDROCODONE 5 MG-ACETAMINOPHEN 325 MG TABLET
ORAL_TABLET | Freq: Three times a day (TID) | ORAL | 0 refills | 0.00000 days | Status: CP | PRN
Start: 2017-10-26 — End: 2018-05-03

## 2018-01-24 DIAGNOSIS — Z7901 Long term (current) use of anticoagulants: Secondary | ICD-10-CM | POA: Insufficient documentation

## 2018-01-24 DIAGNOSIS — I10 Essential (primary) hypertension: Secondary | ICD-10-CM | POA: Insufficient documentation

## 2018-02-23 ENCOUNTER — Ambulatory Visit
Admit: 2018-02-23 | Discharge: 2018-02-23 | Payer: MEDICARE | Attending: Physical Medicine & Rehabilitation | Primary: Physical Medicine & Rehabilitation

## 2018-02-23 ENCOUNTER — Ambulatory Visit: Admit: 2018-02-23 | Discharge: 2018-02-23 | Payer: MEDICARE

## 2018-02-23 DIAGNOSIS — M5417 Radiculopathy, lumbosacral region: Principal | ICD-10-CM

## 2018-02-23 DIAGNOSIS — M5416 Radiculopathy, lumbar region: Principal | ICD-10-CM

## 2018-05-03 ENCOUNTER — Ambulatory Visit
Admit: 2018-05-03 | Discharge: 2018-05-03 | Payer: MEDICARE | Attending: Orthopaedic Surgery of the Spine | Primary: Orthopaedic Surgery of the Spine

## 2018-05-03 ENCOUNTER — Ambulatory Visit: Admit: 2018-05-03 | Discharge: 2018-05-03 | Payer: MEDICARE

## 2018-05-03 DIAGNOSIS — M545 Low back pain: Principal | ICD-10-CM

## 2018-05-03 DIAGNOSIS — M5416 Radiculopathy, lumbar region: Principal | ICD-10-CM

## 2018-05-03 MED ORDER — HYDROCODONE 5 MG-ACETAMINOPHEN 325 MG TABLET
ORAL_TABLET | Freq: Three times a day (TID) | ORAL | 0 refills | 0.00000 days | Status: CP | PRN
Start: 2018-05-03 — End: ?

## 2018-05-25 ENCOUNTER — Other Ambulatory Visit: Payer: Self-pay | Admitting: Internal Medicine

## 2018-05-25 DIAGNOSIS — Z1231 Encounter for screening mammogram for malignant neoplasm of breast: Secondary | ICD-10-CM

## 2018-06-20 MED ORDER — ALENDRONATE 70 MG TABLET
ORAL_TABLET | ORAL | 3 refills | 0.00000 days | Status: CP
Start: 2018-06-20 — End: 2019-06-20

## 2018-07-17 ENCOUNTER — Other Ambulatory Visit: Payer: Self-pay | Admitting: Student

## 2018-07-17 DIAGNOSIS — G8929 Other chronic pain: Secondary | ICD-10-CM

## 2018-07-17 DIAGNOSIS — M5442 Lumbago with sciatica, left side: Principal | ICD-10-CM

## 2018-07-20 ENCOUNTER — Ambulatory Visit
Admission: RE | Admit: 2018-07-20 | Discharge: 2018-07-20 | Disposition: A | Discharge status: Home / Self Care | Payer: Medicare Other | Source: Ambulatory Visit | Attending: Internal Medicine | Admitting: Internal Medicine

## 2018-07-20 DIAGNOSIS — Z1231 Encounter for screening mammogram for malignant neoplasm of breast: Secondary | ICD-10-CM | POA: Insufficient documentation

## 2018-07-21 ENCOUNTER — Ambulatory Visit
Admission: RE | Admit: 2018-07-21 | Discharge: 2018-07-21 | Disposition: A | Discharge status: Home / Self Care | Payer: Medicare Other | Source: Ambulatory Visit | Attending: Student | Admitting: Student

## 2018-07-21 DIAGNOSIS — S32011A Stable burst fracture of first lumbar vertebra, initial encounter for closed fracture: Secondary | ICD-10-CM

## 2018-07-21 DIAGNOSIS — X58XXXA Exposure to other specified factors, initial encounter: Secondary | ICD-10-CM

## 2018-07-21 DIAGNOSIS — M5442 Lumbago with sciatica, left side: Principal | ICD-10-CM

## 2018-07-21 DIAGNOSIS — G8929 Other chronic pain: Secondary | ICD-10-CM

## 2018-07-21 DIAGNOSIS — M48061 Spinal stenosis, lumbar region without neurogenic claudication: Secondary | ICD-10-CM

## 2018-07-21 DIAGNOSIS — M4316 Spondylolisthesis, lumbar region: Secondary | ICD-10-CM

## 2018-07-21 DIAGNOSIS — M5127 Other intervertebral disc displacement, lumbosacral region: Secondary | ICD-10-CM | POA: Insufficient documentation

## 2018-07-21 DIAGNOSIS — Z1231 Encounter for screening mammogram for malignant neoplasm of breast: Secondary | ICD-10-CM | POA: Diagnosis not present

## 2018-08-14 DIAGNOSIS — R9431 Abnormal electrocardiogram [ECG] [EKG]: Secondary | ICD-10-CM | POA: Insufficient documentation

## 2018-08-14 DIAGNOSIS — Z01818 Encounter for other preprocedural examination: Secondary | ICD-10-CM | POA: Insufficient documentation

## 2018-09-04 DIAGNOSIS — M5416 Radiculopathy, lumbar region: Secondary | ICD-10-CM | POA: Insufficient documentation

## 2018-10-01 ENCOUNTER — Other Ambulatory Visit: Payer: Self-pay

## 2018-10-01 ENCOUNTER — Emergency Department: Payer: Medicare Other

## 2018-10-01 ENCOUNTER — Encounter: Payer: Self-pay | Admitting: Emergency Medicine

## 2018-10-01 ENCOUNTER — Emergency Department
Admission: EM | Admit: 2018-10-01 | Discharge: 2018-10-01 | Disposition: A | Discharge status: Skilled Nursing Facility | Payer: Medicare Other | Attending: Student in an Organized Health Care Education/Training Program | Admitting: Student in an Organized Health Care Education/Training Program

## 2018-10-01 DIAGNOSIS — I10 Essential (primary) hypertension: Secondary | ICD-10-CM | POA: Diagnosis not present

## 2018-10-01 DIAGNOSIS — M545 Low back pain: Secondary | ICD-10-CM | POA: Diagnosis present

## 2018-10-01 DIAGNOSIS — M5442 Lumbago with sciatica, left side: Secondary | ICD-10-CM | POA: Diagnosis not present

## 2018-10-01 DIAGNOSIS — Z7901 Long term (current) use of anticoagulants: Secondary | ICD-10-CM | POA: Diagnosis not present

## 2018-10-01 DIAGNOSIS — Z7984 Long term (current) use of oral hypoglycemic drugs: Secondary | ICD-10-CM | POA: Diagnosis not present

## 2018-10-01 DIAGNOSIS — E039 Hypothyroidism, unspecified: Secondary | ICD-10-CM | POA: Insufficient documentation

## 2018-10-01 DIAGNOSIS — Z79899 Other long term (current) drug therapy: Secondary | ICD-10-CM | POA: Diagnosis not present

## 2018-10-01 DIAGNOSIS — E119 Type 2 diabetes mellitus without complications: Secondary | ICD-10-CM | POA: Diagnosis not present

## 2018-10-01 MED ORDER — LIDOCAINE 5 % EX PTCH: 1.0000 | MEDICATED_PATCH | Freq: Two times a day (BID) | CUTANEOUS | 0 refills | Status: DC

## 2018-10-01 MED ORDER — BUPIVACAINE HCL (PF) 0.5 % IJ SOLN
INTRAMUSCULAR | Status: AC
  Filled 2018-10-01: qty 30

## 2018-10-01 MED ORDER — LIDOCAINE 5 % EX PTCH
1.0000 | MEDICATED_PATCH | CUTANEOUS | Status: DC
  Administered 2018-10-01: 1 via TRANSDERMAL
  Filled 2018-10-01: qty 1

## 2018-10-01 MED ORDER — TRAMADOL HCL 50 MG PO TABS
50.0000 mg | ORAL_TABLET | Freq: Once | ORAL | Status: AC
  Administered 2018-10-01: 50 mg via ORAL
  Filled 2018-10-01: qty 1

## 2018-10-01 NOTE — ED Notes (Signed)
Pt moved to room 17   Report called to [employer]

## 2018-10-01 NOTE — Progress Notes (Addendum)
Patient has been approved to transport to [facility] respite bed- Confirmed this with [place].  Patient is to be transported to [facility] in the respite area room 318 Call report number is [phone]  LCSW consulted with EDP and coverage nurse who will let Edd Arbour know patient to transport to [facility] by EMS  Completed Fl2 and uploaded via the [place] [phone]

## 2018-10-01 NOTE — ED Notes (Signed)
External catheter placed for pt. Moved over from flex for back pain after back surgery.

## 2018-10-01 NOTE — ED Provider Notes (Signed)
[facility] Emergency Department Provider Note    First MD Initiated Contact with Patient 10/01/18 1306     (approximate)  I have reviewed the triage vital signs and the nursing notes.   HISTORY  Chief Complaint Back Pain    HPI Sierra [name] is a 82 y.o. female as well as a past medical history presents the ER for worsening left low back pain.  States the pain is worse with movement.  Denies any weakness or numbness or tingling.  Patient states the pain feels similar to previous sciatica pain but more severe today.  She took one Norco but does not feel that she is having any improvement in symptoms.  States that she is having difficulty getting up out of bed or standing due to the pain.  Denies any difficulty compliant controlling her bladder bowels.  No saddle anesthesia.    Past Medical History:  Diagnosis Date  . Anginal pain ([place])   . Anxiety   . Diabetes mellitus without complication ([place])   . Hypertension   . Hypothyroidism   . Protein C deficiency ([place])   . Protein S deficiency ([place])    Family History  Problem Relation Age of Onset  . Breast cancer Sister    Past Surgical History:  Procedure Laterality Date  . BACK SURGERY    . BREAST EXCISIONAL BIOPSY Left 2003?   benign  . COLONOSCOPY N/A 09/12/2017   Procedure: COLONOSCOPY;  Surgeon: Lollie Sails, MD;  Location: [facility] ENDOSCOPY;  Service: Endoscopy;  Laterality: N/A;  . COLONOSCOPY WITH PROPOFOL N/A 01/20/2016   Procedure: COLONOSCOPY WITH PROPOFOL;  Surgeon: Lollie Sails, MD;  Location: [facility] ENDOSCOPY;  Service: Endoscopy;  Laterality: N/A;  . FRACTURE SURGERY    . PERIPHERAL VASCULAR CATHETERIZATION N/A 05/13/2015   Procedure: IVC Filter Removal;  Surgeon: Katha Cabal, MD;  Location: [facility] CV LAB;  Service: Cardiovascular;  Laterality: N/A;  . TONSILLECTOMY     There are no active problems to display for this patient.     Prior to Admission medications     Medication Sig Start Date End Date Taking? Authorizing Provider  atenolol (TENORMIN) 25 MG tablet Take 25 mg by mouth daily.    [provider]  atorvastatin (LIPITOR) 40 MG tablet Take 40 mg by mouth daily.    [provider]  budesonide (ENTOCORT EC) 3 MG 24 hr capsule Take 3 mg by mouth daily.    [provider]  clidinium-chlordiazePOXIDE (LIBRAX) 5-2.5 MG per capsule Take 1 capsule by mouth.    [provider]  diphenoxylate-atropine (LOMOTIL) 2.5-0.025 MG per tablet Take 1 tablet by mouth 4 (four) times daily as needed for diarrhea or loose stools.    [provider]  DULoxetine (CYMBALTA) 60 MG capsule Take 60 mg by mouth daily.    [provider]  levothyroxine (SYNTHROID, LEVOTHROID) 88 MCG tablet Take 88 mcg by mouth daily before breakfast.    [provider]  lisinopril (PRINIVIL,ZESTRIL) 20 MG tablet Take 20 mg by mouth daily.    [provider]  omeprazole (PRILOSEC) 40 MG capsule Take 40 mg by mouth daily.    [provider]  sitaGLIPtin-metformin (JANUMET) 50-500 MG per tablet Take 2 tablets by mouth 2 (two) times daily with a meal.    [provider]  warfarin (COUMADIN) 5 MG tablet Take 5 mg by mouth daily.    [provider]    Allergies Gentamicin  Social History Social History   Tobacco Use  . Smoking status: Former Research scientist (life sciences)  . Smokeless tobacco: Never Used  Substance Use Topics  . Alcohol use: No  . Drug use: No    Review of Systems Patient denies headaches, rhinorrhea, blurry vision, numbness, shortness of breath, chest pain, edema, cough, abdominal pain, nausea, vomiting, diarrhea, dysuria, fevers, rashes or hallucinations unless otherwise stated above in HPI. ____________________________________________   PHYSICAL EXAM:  VITAL SIGNS: Vitals:   10/01/18 1234  BP: (!) 178/86  Pulse: 72  Resp: 14  Temp: 97.7 F (36.5 C)  SpO2: 100%     Constitutional: Alert and oriented.  Eyes: Conjunctivae are normal.  Head: Atraumatic. Nose: No congestion/rhinnorhea. Mouth/Throat: Mucous membranes are moist.   Neck: No stridor. Painless ROM.  Cardiovascular: Normal rate, regular rhythm. Grossly normal heart sounds.  Good peripheral circulation. Respiratory: Normal respiratory effort.  No retractions. Lungs CTAB. Gastrointestinal: Soft and nontender. No distention. No abdominal bruits. No CVA tenderness. Genitourinary:  Musculoskeletal: Pain reproduced with left straight leg raise.  Downgoing Babinski's.  Patellar reflexes present and equal bilaterally as well as Achilles reflex.  Sensation is intact.  Good motor strength.  Pain reproduced with palpation of the left paraspinal paralumbar muscles and overlying the left SI joint.  No lower extremity tenderness nor edema.  No joint effusions. Neurologic:  Normal speech and language. No gross focal neurologic deficits are appreciated. No facial droop Skin:  Skin is warm, dry and intact. No rash noted. Psychiatric: Mood and affect are normal. Speech and behavior are normal.  ____________________________________________   LABS (all labs ordered are listed, but only abnormal results are displayed)  No results found for this or any previous visit (from the past 24 hour(s)). ____________________________________________  ____________________________________________  [id number]  I personally reviewed all radiographic images ordered to evaluate for the above acute complaints and reviewed radiology reports and findings.  These findings were personally discussed with the patient.  Please see medical record for radiology report.  ____________________________________________   PROCEDURES  Procedure(s) performed:  Procedures    Critical Care performed: no ____________________________________________   INITIAL IMPRESSION / ASSESSMENT AND PLAN / ED COURSE  Pertinent labs & imaging  results that were available during my care of the patient were reviewed by me and considered in my medical decision making (see chart for details).   DDX: fracture, contusion, sciatica, si pain  Katria Botts is a 82 y.o. who presents to the ED with low back pain and exam consistent with lumbago and site [place].  X-rays show no evidence of fracture.  Does not have any focal neuro deficits to suggest cord compression or indication for emergent neuroimaging.  I recommended trigger point injection.  Clinical Course as of Oct 01 1654  Sun Oct 01, 2018  1438 Procedure: Trigger point injection. The patient agreed to the procedure without reservation, and acknowledging the risks of infection, nerve damage, damage to internal organs, lack of efficacy, bleeding. The right low paralumbar region was prepped with chloraprep and allowed to dry. A 10 cc syringe was loaded with 0.5% Bupivacaine and a 27 ga needle advanced toward the areas of discomfort The plunger was withdrawn before injection. The patient tolerated the procedure well.    [PR]  0973 Patient's pain improved.  Family is currently calling [facility] to evaluate for short-term rehab placement.   [PR]  1652 Patient does have rehab bed available.  Will give prescription for Ultram as well as Lidoderm patches.  She does have improvement  in movement after her trigger point injection.  Does not have any signs or symptoms of cauda equina or spinal stenosis.  Do not feel that further emergent diagnostic testing clinically indicated at this time.   [PR]    Clinical Course User Index [PR] Merlyn Lot, MD     As part of my medical decision making, I reviewed the following data within the [place] notes reviewed and incorporated, Labs reviewed, notes from prior ED visits.  ____________________________________________   FINAL CLINICAL IMPRESSION(S) / ED DIAGNOSES  Final diagnoses:  Acute left-sided low back pain with  left-sided sciatica      NEW MEDICATIONS STARTED DURING THIS VISIT:  New Prescriptions   No medications on file     Note:  This document was prepared using Dragon voice recognition software and may include unintentional dictation errors.    Merlyn Lot, MD 10/01/18 1655

## 2018-10-01 NOTE — ED Triage Notes (Signed)
Presents via ems with back pain  States she recent back surgery (10/28)  Walks with walker at home  But developed increased pain this am when she went to bathroom  States pain is mainly to left buttock area and into leg   States pain is about 2 /10 on arrival   States she took 1 Norco PTA  Pain increases with bending or standing

## 2018-10-01 NOTE — ED Notes (Signed)
Pt states she lives in the independent living at [facility]. She has an order for physical therapy there at the facility but so far has not started it. Pt's daughter is calling [facility] to see if patient can go into the rehab facility and receive the pt while there.

## 2018-10-01 NOTE — NC FL2 (Signed)
[place] LEVEL OF CARE SCREENING TOOL     IDENTIFICATION  Patient Name: Sierra [name] Birthdate: [birth year] Sex: female Admission Date (Current Location): 10/01/2018  [place] and [place] Number:  Engineering geologist and Address:  [facility], [address], [place], [place] [address]      Provider Number: [id number]  Attending Physician Name and Address:  Merlyn Lot, MD  Relative Name and Phone Number:       Current Level of Care: Hospital Recommended Level of Care: [place] Prior Approval Number:    Date Approved/Denied:   PASRR Number:  [id number]   Discharge Plan: SNF    Current Diagnoses: There are no active problems to display for this patient.   Orientation RESPIRATION BLADDER Height & Weight     Self, Time, Situation, Place  Normal Continent Weight: 196 lb (88.9 kg) Height:  5\' 7"  (170.2 cm)  BEHAVIORAL SYMPTOMS/MOOD NEUROLOGICAL BOWEL NUTRITION STATUS      Incontinent Diet(Diabetic Diet)  AMBULATORY STATUS COMMUNICATION OF NEEDS Skin   Extensive Assist   Normal                       Personal Care Assistance Level of Assistance  Bathing, Feeding, Dressing, Total care Bathing Assistance: Limited assistance Feeding assistance: Independent Dressing Assistance: Limited assistance Total Care Assistance: Limited assistance   Functional Limitations Info  Sight, Hearing, Speech Sight Info: Adequate Hearing Info: Adequate Speech Info: Adequate    SPECIAL CARE FACTORS FREQUENCY  PT (By licensed PT), OT (By licensed OT)     PT Frequency: x5 OT Frequency: x5            Contractures Contractures Info: Not present    Additional Factors Info  Code Status Code Status Info: Full code             Current Medications (10/01/2018):  This is the current hospital active medication list Current Facility-Administered Medications  Medication Dose Route Frequency Provider Last Rate  Last Dose  . bupivacaine (MARCAINE) 0.5 % injection            Current Outpatient Medications  Medication Sig Dispense Refill  . atenolol (TENORMIN) 25 MG tablet Take 25 mg by mouth daily.    [place] Kitchen atorvastatin (LIPITOR) 40 MG tablet Take 40 mg by mouth daily.    . budesonide (ENTOCORT EC) 3 MG 24 hr capsule Take 3 mg by mouth daily.    . clidinium-chlordiazePOXIDE (LIBRAX) 5-2.5 MG per capsule Take 1 capsule by mouth.    . diphenoxylate-atropine (LOMOTIL) 2.5-0.025 MG per tablet Take 1 tablet by mouth 4 (four) times daily as needed for diarrhea or loose stools.    . DULoxetine (CYMBALTA) 60 MG capsule Take 60 mg by mouth daily.    [place] Kitchen levothyroxine (SYNTHROID, LEVOTHROID) 88 MCG tablet Take 88 mcg by mouth daily before breakfast.    . lidocaine (LIDODERM) 5 % Place 1 patch onto the skin every 12 (twelve) hours. Remove & Discard patch within 12 hours or as directed by MD 10 patch 0  . lisinopril (PRINIVIL,ZESTRIL) 20 MG tablet Take 20 mg by mouth daily.    [place] Kitchen omeprazole (PRILOSEC) 40 MG capsule Take 40 mg by mouth daily.    . sitaGLIPtin-metformin (JANUMET) 50-500 MG per tablet Take 2 tablets by mouth 2 (two) times daily with a meal.    . warfarin (COUMADIN) 5 MG tablet Take 5 mg by mouth daily.       Discharge  Medications: Please see discharge summary for a list of discharge medications.  Relevant Imaging Results:  Relevant Lab Results:   Additional Information SSN [id number]  Joana Reamer, [place]

## 2018-10-01 NOTE — ED Notes (Signed)
Spoke with Sierra [name] regarding referal

## 2018-10-04 DIAGNOSIS — S31144A Puncture wound of abdominal wall with foreign body, left lower quadrant without penetration into peritoneal cavity, initial encounter: Secondary | ICD-10-CM

## 2018-10-04 DIAGNOSIS — M545 Low back pain: Secondary | ICD-10-CM

## 2018-10-10 DIAGNOSIS — D6851 Activated protein C resistance: Secondary | ICD-10-CM

## 2018-10-10 DIAGNOSIS — E119 Type 2 diabetes mellitus without complications: Secondary | ICD-10-CM

## 2018-10-10 DIAGNOSIS — F39 Unspecified mood [affective] disorder: Secondary | ICD-10-CM

## 2018-10-10 DIAGNOSIS — I10 Essential (primary) hypertension: Secondary | ICD-10-CM

## 2018-10-10 DIAGNOSIS — M5416 Radiculopathy, lumbar region: Secondary | ICD-10-CM

## 2018-12-14 ENCOUNTER — Other Ambulatory Visit: Payer: Self-pay | Admitting: Family Medicine

## 2018-12-14 ENCOUNTER — Ambulatory Visit
Admission: RE | Admit: 2018-12-14 | Discharge: 2018-12-14 | Disposition: A | Discharge status: Home / Self Care | Payer: Medicare Other | Source: Ambulatory Visit | Attending: Family Medicine | Admitting: Family Medicine

## 2018-12-14 DIAGNOSIS — M7989 Other specified soft tissue disorders: Secondary | ICD-10-CM

## 2019-01-11 ENCOUNTER — Encounter: Payer: Self-pay | Admitting: Emergency Medicine

## 2019-01-11 ENCOUNTER — Emergency Department: Payer: Medicare Other

## 2019-01-11 ENCOUNTER — Other Ambulatory Visit: Payer: Self-pay

## 2019-01-11 ENCOUNTER — Inpatient Hospital Stay
Admission: EM | Admit: 2019-01-11 | Discharge: 2019-01-15 | DRG: 470 | Disposition: A | Discharge status: Skilled Nursing Facility | Payer: Medicare Other | Attending: Internal Medicine | Admitting: Internal Medicine

## 2019-01-11 DIAGNOSIS — Z96649 Presence of unspecified artificial hip joint: Secondary | ICD-10-CM

## 2019-01-11 DIAGNOSIS — S72009A Fracture of unspecified part of neck of unspecified femur, initial encounter for closed fracture: Secondary | ICD-10-CM | POA: Diagnosis present

## 2019-01-11 DIAGNOSIS — Z7901 Long term (current) use of anticoagulants: Secondary | ICD-10-CM | POA: Diagnosis not present

## 2019-01-11 DIAGNOSIS — I10 Essential (primary) hypertension: Secondary | ICD-10-CM | POA: Diagnosis present

## 2019-01-11 DIAGNOSIS — I452 Bifascicular block: Secondary | ICD-10-CM | POA: Diagnosis present

## 2019-01-11 DIAGNOSIS — E785 Hyperlipidemia, unspecified: Secondary | ICD-10-CM | POA: Diagnosis present

## 2019-01-11 DIAGNOSIS — D6859 Other primary thrombophilia: Secondary | ICD-10-CM | POA: Diagnosis present

## 2019-01-11 DIAGNOSIS — I4892 Unspecified atrial flutter: Secondary | ICD-10-CM | POA: Diagnosis present

## 2019-01-11 DIAGNOSIS — Y92481 Parking lot as the place of occurrence of the external cause: Secondary | ICD-10-CM | POA: Diagnosis not present

## 2019-01-11 DIAGNOSIS — M810 Age-related osteoporosis without current pathological fracture: Secondary | ICD-10-CM | POA: Diagnosis present

## 2019-01-11 DIAGNOSIS — W1839XA Other fall on same level, initial encounter: Secondary | ICD-10-CM | POA: Diagnosis present

## 2019-01-11 DIAGNOSIS — E039 Hypothyroidism, unspecified: Secondary | ICD-10-CM | POA: Diagnosis present

## 2019-01-11 DIAGNOSIS — Z6830 Body mass index (BMI) 30.0-30.9, adult: Secondary | ICD-10-CM | POA: Diagnosis not present

## 2019-01-11 DIAGNOSIS — I440 Atrioventricular block, first degree: Secondary | ICD-10-CM | POA: Diagnosis present

## 2019-01-11 DIAGNOSIS — I480 Paroxysmal atrial fibrillation: Secondary | ICD-10-CM | POA: Diagnosis present

## 2019-01-11 DIAGNOSIS — Z79890 Hormone replacement therapy (postmenopausal): Secondary | ICD-10-CM | POA: Diagnosis not present

## 2019-01-11 DIAGNOSIS — Z79899 Other long term (current) drug therapy: Secondary | ICD-10-CM

## 2019-01-11 DIAGNOSIS — E669 Obesity, unspecified: Secondary | ICD-10-CM | POA: Diagnosis present

## 2019-01-11 DIAGNOSIS — Z7952 Long term (current) use of systemic steroids: Secondary | ICD-10-CM

## 2019-01-11 DIAGNOSIS — Z87891 Personal history of nicotine dependence: Secondary | ICD-10-CM

## 2019-01-11 DIAGNOSIS — W19XXXA Unspecified fall, initial encounter: Secondary | ICD-10-CM

## 2019-01-11 DIAGNOSIS — Z419 Encounter for procedure for purposes other than remedying health state, unspecified: Secondary | ICD-10-CM

## 2019-01-11 DIAGNOSIS — E119 Type 2 diabetes mellitus without complications: Secondary | ICD-10-CM | POA: Diagnosis present

## 2019-01-11 DIAGNOSIS — S72002A Fracture of unspecified part of neck of left femur, initial encounter for closed fracture: Secondary | ICD-10-CM | POA: Diagnosis not present

## 2019-01-11 DIAGNOSIS — M25552 Pain in left hip: Secondary | ICD-10-CM | POA: Diagnosis not present

## 2019-01-11 LAB — COMPREHENSIVE METABOLIC PANEL
ALT: 15 U/L (ref 0–44)
AST: 21 U/L (ref 15–41)
Albumin: 4 g/dL (ref 3.5–5.0)
Alkaline Phosphatase: 47 U/L (ref 38–126)
Anion gap: 9 (ref 5–15)
BUN: 28 mg/dL — ABNORMAL HIGH (ref 8–23)
CO2: 23 mmol/L (ref 22–32)
Calcium: 9 mg/dL (ref 8.9–10.3)
Chloride: 107 mmol/L (ref 98–111)
Creatinine, Ser: 1.44 mg/dL — ABNORMAL HIGH (ref 0.44–1.00)
GFR calc Af Amer: 40 mL/min — ABNORMAL LOW (ref >60)
GFR calc non Af Amer: 34 mL/min — ABNORMAL LOW (ref >60)
Glucose, Bld: 103 mg/dL — ABNORMAL HIGH (ref 70–99)
Potassium: 4.1 mmol/L (ref 3.5–5.1)
Sodium: 139 mmol/L (ref 135–145)
Total Bilirubin: 1 mg/dL (ref 0.3–1.2)
Total Protein: 7.1 g/dL (ref 6.5–8.1)

## 2019-01-11 LAB — CBC WITH DIFFERENTIAL/PLATELET
Abs Immature Granulocytes: 0.04 10*3/uL (ref 0.00–0.07)
Basophils Absolute: 0 10*3/uL (ref 0.0–0.1)
Basophils Relative: 0 %
Eosinophils Absolute: 0.1 10*3/uL (ref 0.0–0.5)
Eosinophils Relative: 1 %
HCT: 42.6 % (ref 36.0–46.0)
Hemoglobin: 13.4 g/dL (ref 12.0–15.0)
Immature Granulocytes: 1 %
Lymphocytes Relative: 16 %
Lymphs Abs: 1.1 10*3/uL (ref 0.7–4.0)
MCH: 29.1 pg (ref 26.0–34.0)
MCHC: 31.5 g/dL (ref 30.0–36.0)
MCV: 92.6 fL (ref 80.0–100.0)
Monocytes Absolute: 0.8 10*3/uL (ref 0.1–1.0)
Monocytes Relative: 11 %
Neutro Abs: 4.9 10*3/uL (ref 1.7–7.7)
Neutrophils Relative %: 71 %
Platelets: 184 10*3/uL (ref 150–400)
RBC: 4.6 MIL/uL (ref 3.87–5.11)
RDW: 13.6 % (ref 11.5–15.5)
WBC: 6.9 10*3/uL (ref 4.0–10.5)
nRBC: 0 % (ref 0.0–0.2)

## 2019-01-11 LAB — PROTIME-INR
INR: 3.3 — ABNORMAL HIGH (ref 0.8–1.2)
Prothrombin Time: 33.2 seconds — ABNORMAL HIGH (ref 11.4–15.2)

## 2019-01-11 LAB — GLUCOSE, CAPILLARY: Glucose-Capillary: 120 mg/dL — ABNORMAL HIGH (ref 70–99)

## 2019-01-11 LAB — TSH: TSH: 4.536 u[IU]/mL — ABNORMAL HIGH (ref 0.350–4.500)

## 2019-01-11 MED ORDER — DULOXETINE HCL 60 MG PO CPEP
60.0000 mg | ORAL_CAPSULE | Freq: Every day | ORAL | Status: DC
  Administered 2019-01-11 – 2019-01-15 (×4): 60 mg via ORAL
  Filled 2019-01-11 (×5): qty 1

## 2019-01-11 MED ORDER — DIPHENOXYLATE-ATROPINE 2.5-0.025 MG PO TABS: 1.0000 | ORAL_TABLET | Freq: Four times a day (QID) | ORAL | Status: DC | PRN

## 2019-01-11 MED ORDER — ALBUTEROL SULFATE (2.5 MG/3ML) 0.083% IN NEBU: 2.5000 mg | INHALATION_SOLUTION | Freq: Four times a day (QID) | RESPIRATORY_TRACT | Status: DC | PRN

## 2019-01-11 MED ORDER — LISINOPRIL 20 MG PO TABS: 20.0000 mg | ORAL_TABLET | Freq: Every day | ORAL | Status: DC

## 2019-01-11 MED ORDER — ATENOLOL 25 MG PO TABS
25.0000 mg | ORAL_TABLET | Freq: Every day | ORAL | Status: DC
  Administered 2019-01-12: 25 mg via ORAL
  Filled 2019-01-11 (×2): qty 1

## 2019-01-11 MED ORDER — DOCUSATE SODIUM 100 MG PO CAPS
100.0000 mg | ORAL_CAPSULE | Freq: Two times a day (BID) | ORAL | Status: DC
  Administered 2019-01-11 – 2019-01-12 (×2): 100 mg via ORAL
  Filled 2019-01-11 (×2): qty 1

## 2019-01-11 MED ORDER — CEFAZOLIN SODIUM-DEXTROSE 2-4 GM/100ML-% IV SOLN
2.0000 g | Freq: Three times a day (TID) | INTRAVENOUS | Status: DC
  Administered 2019-01-12 – 2019-01-13 (×2): 2 g via INTRAVENOUS
  Filled 2019-01-11 (×7): qty 100

## 2019-01-11 MED ORDER — PANTOPRAZOLE SODIUM 40 MG PO TBEC
40.0000 mg | DELAYED_RELEASE_TABLET | Freq: Every day | ORAL | Status: DC
  Administered 2019-01-13 – 2019-01-15 (×3): 40 mg via ORAL
  Filled 2019-01-11 (×3): qty 1

## 2019-01-11 MED ORDER — ACETAMINOPHEN 325 MG PO TABS: 650.0000 mg | ORAL_TABLET | Freq: Four times a day (QID) | ORAL | Status: DC | PRN

## 2019-01-11 MED ORDER — HYDROCODONE-ACETAMINOPHEN 5-325 MG PO TABS
1.0000 | ORAL_TABLET | ORAL | Status: DC | PRN
  Administered 2019-01-11 – 2019-01-13 (×4): 1 via ORAL
  Filled 2019-01-11 (×4): qty 1

## 2019-01-11 MED ORDER — CILIDINIUM-CHLORDIAZEPOXIDE 2.5-5 MG PO CAPS
1.0000 | ORAL_CAPSULE | Freq: Three times a day (TID) | ORAL | Status: DC
  Administered 2019-01-11 – 2019-01-15 (×11): 1 via ORAL
  Filled 2019-01-11 (×17): qty 1

## 2019-01-11 MED ORDER — ACETAMINOPHEN 650 MG RE SUPP: 650.0000 mg | Freq: Four times a day (QID) | RECTAL | Status: DC | PRN

## 2019-01-11 MED ORDER — ALBUTEROL SULFATE (2.5 MG/3ML) 0.083% IN NEBU: 2.5000 mg | INHALATION_SOLUTION | Freq: Four times a day (QID) | RESPIRATORY_TRACT | Status: DC

## 2019-01-11 MED ORDER — INSULIN ASPART 100 UNIT/ML ~~LOC~~ SOLN: 0.0000 [IU] | Freq: Every day | SUBCUTANEOUS | Status: DC

## 2019-01-11 MED ORDER — BUDESONIDE 3 MG PO CPEP
3.0000 mg | ORAL_CAPSULE | Freq: Every day | ORAL | Status: DC
  Administered 2019-01-13 – 2019-01-15 (×3): 3 mg via ORAL
  Filled 2019-01-11 (×4): qty 1

## 2019-01-11 MED ORDER — LIDOCAINE 5 % EX PTCH
1.0000 | MEDICATED_PATCH | Freq: Two times a day (BID) | CUTANEOUS | Status: DC
  Administered 2019-01-11 – 2019-01-15 (×8): 1 via TRANSDERMAL
  Filled 2019-01-11 (×9): qty 1

## 2019-01-11 MED ORDER — LEVOTHYROXINE SODIUM 88 MCG PO TABS
88.0000 ug | ORAL_TABLET | Freq: Every day | ORAL | Status: DC
  Administered 2019-01-12 – 2019-01-15 (×4): 88 ug via ORAL
  Filled 2019-01-11 (×4): qty 1

## 2019-01-11 MED ORDER — HYDROMORPHONE HCL 1 MG/ML IJ SOLN
0.5000 mg | INTRAMUSCULAR | Status: DC | PRN
  Administered 2019-01-11 – 2019-01-12 (×2): 0.5 mg via INTRAVENOUS
  Filled 2019-01-11 (×2): qty 1

## 2019-01-11 MED ORDER — SODIUM CHLORIDE 0.9% FLUSH
3.0000 mL | Freq: Two times a day (BID) | INTRAVENOUS | Status: DC
  Administered 2019-01-11 – 2019-01-12 (×2): 3 mL via INTRAVENOUS

## 2019-01-11 MED ORDER — VITAMIN K1 10 MG/ML IJ SOLN
5.0000 mg | Freq: Once | INTRAVENOUS | Status: AC
  Administered 2019-01-11: 5 mg via INTRAVENOUS
  Filled 2019-01-11: qty 0.5

## 2019-01-11 MED ORDER — SODIUM CHLORIDE 0.9 % IV SOLN
250.0000 mL | INTRAVENOUS | Status: DC | PRN
  Administered 2019-01-12: 250 mL via INTRAVENOUS

## 2019-01-11 MED ORDER — POLYETHYLENE GLYCOL 3350 17 G PO PACK: 17.0000 g | PACK | Freq: Every day | ORAL | Status: DC | PRN

## 2019-01-11 MED ORDER — INSULIN ASPART 100 UNIT/ML ~~LOC~~ SOLN
0.0000 [IU] | Freq: Three times a day (TID) | SUBCUTANEOUS | Status: DC
  Administered 2019-01-11 – 2019-01-12 (×3): 2 [IU] via SUBCUTANEOUS
  Administered 2019-01-13 (×2): 3 [IU] via SUBCUTANEOUS
  Administered 2019-01-13: 5 [IU] via SUBCUTANEOUS
  Administered 2019-01-14 (×3): 3 [IU] via SUBCUTANEOUS
  Filled 2019-01-11 (×9): qty 1

## 2019-01-11 MED ORDER — HYDRALAZINE HCL 20 MG/ML IJ SOLN
10.0000 mg | INTRAMUSCULAR | Status: DC | PRN
  Filled 2019-01-11: qty 1

## 2019-01-11 MED ORDER — SODIUM CHLORIDE 0.9% FLUSH: 3.0000 mL | INTRAVENOUS | Status: DC | PRN

## 2019-01-11 MED ORDER — LINAGLIPTIN 5 MG PO TABS
5.0000 mg | ORAL_TABLET | Freq: Every day | ORAL | Status: DC
  Administered 2019-01-13 – 2019-01-15 (×3): 5 mg via ORAL
  Filled 2019-01-11 (×4): qty 1

## 2019-01-11 MED ORDER — ATORVASTATIN CALCIUM 20 MG PO TABS
40.0000 mg | ORAL_TABLET | Freq: Every day | ORAL | Status: DC
  Administered 2019-01-11 – 2019-01-14 (×4): 40 mg via ORAL
  Filled 2019-01-11 (×4): qty 2

## 2019-01-11 MED ORDER — ONDANSETRON HCL 4 MG PO TABS: 4.0000 mg | ORAL_TABLET | Freq: Four times a day (QID) | ORAL | Status: DC | PRN

## 2019-01-11 MED ORDER — ONDANSETRON HCL 4 MG/2ML IJ SOLN: 4.0000 mg | Freq: Four times a day (QID) | INTRAMUSCULAR | Status: DC | PRN

## 2019-01-11 NOTE — ED Provider Notes (Signed)
[facility] Emergency Department Provider Note       Time seen: ----------------------------------------- 2:05 PM on 01/11/2019 -----------------------------------------   I have reviewed the triage vital signs and the nursing notes.  HISTORY   Chief Complaint No chief complaint on file.    HPI Sierra [name] is a 83 y.o. female with a history of anginal pain, anxiety, diabetes, hypertension, protein C&S deficiency who presents to the ED for left hip pain after fall.  Patient was in the parking lot after shopping and fell injuring her left hip.  She cannot bear weight since the fall.  She denies any other injuries or complaints.  Past Medical History:  Diagnosis Date  . Anginal pain ([place])   . Anxiety   . Diabetes mellitus without complication ([place])   . Hypertension   . Hypothyroidism   . Protein C deficiency ([place])   . Protein S deficiency ([place])     There are no active problems to display for this patient.   Past Surgical History:  Procedure Laterality Date  . BACK SURGERY    . BREAST EXCISIONAL BIOPSY Left 2003?   benign  . COLONOSCOPY N/A 09/12/2017   Procedure: COLONOSCOPY;  Surgeon: Lollie Sails, MD;  Location: [facility] ENDOSCOPY;  Service: Endoscopy;  Laterality: N/A;  . COLONOSCOPY WITH PROPOFOL N/A 01/20/2016   Procedure: COLONOSCOPY WITH PROPOFOL;  Surgeon: Lollie Sails, MD;  Location: [facility] ENDOSCOPY;  Service: Endoscopy;  Laterality: N/A;  . FRACTURE SURGERY    . PERIPHERAL VASCULAR CATHETERIZATION N/A 05/13/2015   Procedure: IVC Filter Removal;  Surgeon: Katha Cabal, MD;  Location: [place] CV LAB;  Service: Cardiovascular;  Laterality: N/A;  . TONSILLECTOMY      Allergies Gentamicin  Social History Social History   Tobacco Use  . Smoking status: Former Research scientist (life sciences)  . Smokeless tobacco: Never Used  Substance Use Topics  . Alcohol use: No  . Drug use: No   Review of Systems Constitutional: Negative for  fever. Cardiovascular: Negative for chest pain. Respiratory: Negative for shortness of breath. Gastrointestinal: Negative for abdominal pain, vomiting and diarrhea. Musculoskeletal: Positive for left hip pain Skin: Negative for rash. Neurological: Negative for headaches, focal weakness or numbness.  All systems negative/normal/unremarkable except as stated in the HPI  ____________________________________________   PHYSICAL EXAM:  VITAL SIGNS: ED Triage Vitals  Enc Vitals Group     BP      Pulse      Resp      Temp      Temp src      SpO2      Weight      Height      Head Circumference      Peak Flow      Pain Score      Pain Loc      Pain Edu?      Excl. in [place]?    Constitutional: Alert and oriented. Well appearing and in no distress. Eyes: Conjunctivae are normal. Normal extraocular movements. ENT      Head: Normocephalic and atraumatic.      Nose: No congestion/rhinnorhea.      Mouth/Throat: Mucous membranes are moist.      Neck: No stridor. Cardiovascular: Normal rate, regular rhythm. No murmurs, rubs, or gallops. Respiratory: Normal respiratory effort without tachypnea nor retractions. Breath sounds are clear and equal bilaterally. No wheezes/rales/rhonchi. Gastrointestinal: Soft and nontender. Normal bowel sounds Musculoskeletal: Left lower extremity is shortened and externally rotated, severe pain with range of  motion of the left hip Neurologic:  Normal speech and language. No gross focal neurologic deficits are appreciated.  Skin:  Skin is warm, dry and intact. No rash noted. Psychiatric: Mood and affect are normal. Speech and behavior are normal.  ____________________________________________  EKG: Interpreted by me.  Sinus rhythm with a rate of 64 bpm, prolonged PR interval, LVH  ____________________________________________  ED COURSE:  As part of my medical decision making, I reviewed the following data within the [place] History  obtained from family if available, nursing notes, old chart and ekg, as well as notes from prior ED visits. Patient presented for fall with left pain and likely hip fracture, we will assess with labs and imaging as indicated at this time.   Procedures ____________________________________________   LABS (pertinent positives/negatives)  Labs Reviewed  CBC WITH DIFFERENTIAL/PLATELET  COMPREHENSIVE METABOLIC PANEL  URINALYSIS, COMPLETE (UACMP) WITH MICROSCOPIC  PROTIME-INR  CBG MONITORING, ED    RADIOLOGY Images were viewed by me  Left hip x-rays Did reveal left hip fracture ____________________________________________   DIFFERENTIAL DIAGNOSIS   Left hip fracture, dislocation, contusion  FINAL ASSESSMENT AND PLAN  Fall, left hip fracture   Plan: The patient had presented for fall with resulting left hip fracture. Patient's labs are still pending at this time. Patient's imaging revealed left hip fracture but otherwise no acute abnormality.  I will discuss with orthopedics and the hospitalist for admission.   Laurence Aly, MD    Note: This note was generated in part or whole with voice recognition software. Voice recognition is usually quite accurate but there are transcription errors that can and very often do occur. I apologize for any typographical errors that were not detected and corrected.     Earleen Newport, MD 01/11/19 [phone]

## 2019-01-11 NOTE — ED Notes (Signed)
Beth, RN called this RN to state that they are moving patient from room 133 to 152 and that room is currently being cleaned at this time.  RN to call this RN back when room is clean.  Will continue to monitor.

## 2019-01-11 NOTE — Progress Notes (Signed)
Full consult note and discussion with patient to follow tomorrow AM.  Called by ED staff. Imaging reviewed. Plan for L hip hemiarthroplasty tomorrow, likely afternoon.  - NPO after midnight - INR 3.3. Recommend 5mg  IV Vitamin K to reverse INR. Repeat INR in AM.  - Admit to Hospitalist team.

## 2019-01-11 NOTE — ED Notes (Signed)
Pt transported to room 152

## 2019-01-11 NOTE — ED Triage Notes (Addendum)
Patient presents to the ED via EMS post fall in the [place] [facility] [employer].  Patient is complaining of left hip pain.  Left leg appears shortened and rotated.  Patient states she was [facility] [employer] after having rehab.  Patient denies any dizziness or light headedness prior to the fall.  Patient states, "my feet were just moving too fast for me and I couldn't stop them."  Patient reports hitting her head when she fell.  Denies passing out.  Patient is alert and oriented x 4 at this time. Patient takes 6mg  of coumadin/day.

## 2019-01-11 NOTE — ED Notes (Signed)
Admitting MD in room at this time.  Will continue to monitor.

## 2019-01-11 NOTE — Progress Notes (Signed)
Central tele called that the pt is running Afib. Dr. Jannifer Franklin made aware. Order for Echo received.

## 2019-01-11 NOTE — ED Notes (Signed)
ED TO INPATIENT HANDOFF REPORT  ED Nurse Name and Phone #: Vicente Males [phone]  S Name/Age/Gender Sierra [name] 83 y.o. female Room/Bed: ED04A/ED04A  Code Status   Code Status: Prior  Home/SNF/Other possibly rehab?  Came from home Patient oriented to: self, place, time and situation Is this baseline? Yes   Triage Complete: Triage complete  Chief Complaint ems/left hip pain  Triage Note Patient presents to the ED via EMS post fall in the parking lot of [employer].  Patient is complaining of left hip pain.  Left leg appears shortened and rotated.  Patient states she was [place] [employer] [facility] after having rehab.  Patient denies any dizziness or light headedness prior to the fall.  Patient states, "my feet were just moving too fast for me and I couldn't stop them."  Patient reports hitting her head when she fell.  Denies passing out.  Patient is alert and oriented x 4 at this time. Patient takes 6mg  of coumadin/day.     Allergies Allergies  Allergen Reactions  . Morphine And Related Other (See Comments)    Agitation and anger  . Gentamicin Other (See Comments)    Kidney function    Level of Care/Admitting Diagnosis ED Disposition    ED Disposition Condition [facility] Area: [place] [[id number]]  Level of Care: Med-Surg [16]  Diagnosis: Hip fracture [facility]) [[id number]]  Admitting Physician: Gorden Harms [[id number]]  Attending Physician: Gorden Harms [[id number]]  Estimated length of stay: past midnight tomorrow  Certification:: I certify this patient will need inpatient services for at least 2 midnights  PT Class (Do Not Modify): Inpatient [101]  PT Acc Code (Do Not Modify): Private [1]       B Medical/Surgery History Past Medical History:  Diagnosis Date  . Anginal pain ([place])   . Anxiety   . Diabetes mellitus without complication ([place])   . Hypertension   . Hypothyroidism   . Protein C deficiency ([place])   . Protein S deficiency [facility])     Past Surgical History:  Procedure Laterality Date  . BACK SURGERY    . BREAST EXCISIONAL BIOPSY Left 2003?   benign  . COLONOSCOPY N/A 09/12/2017   Procedure: COLONOSCOPY;  Surgeon: Lollie Sails, MD;  Location: [facility] ENDOSCOPY;  Service: Endoscopy;  Laterality: N/A;  . COLONOSCOPY WITH PROPOFOL N/A 01/20/2016   Procedure: COLONOSCOPY WITH PROPOFOL;  Surgeon: Lollie Sails, MD;  Location: [facility] ENDOSCOPY;  Service: Endoscopy;  Laterality: N/A;  . FRACTURE SURGERY    . PERIPHERAL VASCULAR CATHETERIZATION N/A 05/13/2015   Procedure: IVC Filter Removal;  Surgeon: Katha Cabal, MD;  Location: [place] CV LAB;  Service: Cardiovascular;  Laterality: N/A;  . TONSILLECTOMY       A IV Location/Drains/Wounds Patient Lines/Drains/Airways Status   Active Line/Drains/Airways    Name:   Placement date:   Placement time:   Site:   Days:   Peripheral IV 10/01/18 Left Antecubital   10/01/18    1145    Antecubital   102   Peripheral IV 01/11/19 Left Antecubital   01/11/19    1428    Antecubital   less than 1          Intake/Output Last 24 hours No intake or output data in the 24 hours ending 01/11/19 1534  Labs/Imaging Results for orders placed or performed during the hospital encounter of 01/11/19 (from the past 48 hour(s))  CBC with Differential     Status:  None   Collection Time: 01/11/19  2:21 PM  Result Value Ref Range   WBC 6.9 4.0 - 10.5 K/uL   RBC 4.60 3.87 - 5.11 MIL/uL   Hemoglobin 13.4 12.0 - 15.0 g/dL   HCT 42.6 36.0 - 46.0 %   MCV 92.6 80.0 - 100.0 fL   MCH 29.1 26.0 - 34.0 pg   MCHC 31.5 30.0 - 36.0 g/dL   RDW 13.6 11.5 - 15.5 %   Platelets 184 150 - 400 K/uL   nRBC 0.0 0.0 - 0.2 %   Neutrophils Relative % 71 %   Neutro Abs 4.9 1.7 - 7.7 K/uL   Lymphocytes Relative 16 %   Lymphs Abs 1.1 0.7 - 4.0 K/uL   Monocytes Relative 11 %   Monocytes Absolute 0.8 0.1 - 1.0 K/uL   Eosinophils Relative 1 %   Eosinophils Absolute 0.1 0.0 - 0.5 K/uL   Basophils  Relative 0 %   Basophils Absolute 0.0 0.0 - 0.1 K/uL   Immature Granulocytes 1 %   Abs Immature Granulocytes 0.04 0.00 - 0.07 K/uL    Comment: Performed at [facility], [address] [place], [place] [address]  Comprehensive metabolic panel     Status: Abnormal   Collection Time: 01/11/19  2:21 PM  Result Value Ref Range   Sodium 139 135 - 145 mmol/L   Potassium 4.1 3.5 - 5.1 mmol/L    Comment: HEMOLYSIS AT THIS LEVEL MAY AFFECT RESULT   Chloride 107 98 - 111 mmol/L   CO2 23 22 - 32 mmol/L   Glucose, Bld 103 (H) 70 - 99 mg/dL   BUN 28 (H) 8 - 23 mg/dL   Creatinine, Ser 1.44 (H) 0.44 - 1.00 mg/dL   Calcium 9.0 8.9 - 10.3 mg/dL   Total Protein 7.1 6.5 - 8.1 g/dL   Albumin 4.0 3.5 - 5.0 g/dL   AST 21 15 - 41 U/L   ALT 15 0 - 44 U/L   Alkaline Phosphatase 47 38 - 126 U/L   Total Bilirubin 1.0 0.3 - 1.2 mg/dL   GFR calc non Af Amer 34 (L) >60 mL/min   GFR calc Af Amer 40 (L) >60 mL/min   Anion gap 9 5 - 15    Comment: Performed at [facility], [address] [place], [place] [address]   Ct Head Wo Contrast  Result Date: 01/11/2019 CLINICAL DATA:  Altered level of consciousness. female with a history of anginal pain, anxiety, diabetes, hypertension, protein C&S deficiency who presents to the ED for left hip pain after fall. Patient was in the parking lot after shopping and fell injuring her left hip. She cannot bear weight since the fall. She denies any other injuries or complaints EXAM: CT HEAD WITHOUT CONTRAST TECHNIQUE: Contiguous axial images were obtained from the base of the skull through the vertex without intravenous contrast. COMPARISON:  None. FINDINGS: Brain: No evidence of acute infarction, hemorrhage, hydrocephalus, extra-axial collection or mass lesion/mass effect. There is age appropriate ventricular and sulcal enlargement. Patchy areas of white matter hypoattenuation noted consistent with mild to moderate chronic microvascular ischemic change. Vascular:  No hyperdense vessel or unexpected calcification. Skull: Normal. Negative for fracture or focal lesion. Sinuses/Orbits: Globes and orbits are unremarkable. The visualized sinuses and mastoid air cells are clear. Other: None. IMPRESSION: 1. No acute intracranial abnormalities. 2. Age-appropriate volume loss. Mild to moderate chronic microvascular ischemic change. Electronically Signed   By: Lajean Manes M.D.   On: 01/11/2019 15:32  Dg Hip Unilat W Or W/o Pelvis 2-3 Views Left  Result Date: 01/11/2019 CLINICAL DATA:  Initial evaluation for acute trauma, fall. EXAM: DG HIP (WITH OR WITHOUT PELVIS) 2-3V LEFT COMPARISON:  None. FINDINGS: There is an acute fracture extending through the left femoral neck with mild superior subluxation. Femoral head remains normally position within the acetabulum. Femoral head height maintained. Bony pelvis intact. Limited views of the right hip demonstrate no acute osseous abnormality. Visible soft tissues within normal limits. IMPRESSION: Acute mildly displaced fracture of the left femoral neck. Electronically Signed   By: Jeannine Boga M.D.   On: 01/11/2019 15:21    Pending Labs Unresulted Labs (From admission, onward)    Start     Ordered   01/11/19 1434  Protime-INR  ONCE - STAT,   STAT     01/11/19 1433   01/11/19 1404  Urinalysis, Complete w Microscopic  (ALOC)  ONCE - STAT,   STAT     01/11/19 1404   Signed and Held  Basic metabolic panel  Tomorrow morning,   R     Signed and Held   Signed and Held  CBC  Tomorrow morning,   R     Signed and Held          Vitals/Pain Today's Vitals   01/11/19 1412 01/11/19 1414 01/11/19 1415  BP: (!) 141/78    Pulse: 65    Resp: 18    Temp: 98.5 F (36.9 C)    TempSrc: Oral    SpO2: 95%    Weight:   88.9 kg  Height:   5' 7.5" (1.715 m)  PainSc:  2      Isolation Precautions No active isolations  Medications Medications - No data to display  Mobility Normally walks but now has hip fracture and is  going to be non-ambulatory  High fall risk   Focused Assessments hip fracture   R Recommendations: See Admitting Provider Note  Report given to:   Additional Notes: Patient positive for hip fracture today and is on coumadin

## 2019-01-11 NOTE — Progress Notes (Signed)
Family Meeting Note  Advance Directive:yes  Today a meeting took place with the Patient.  Patient is able to participate   The following clinical team members were present during this meeting:MD  The following were discussed:Patient's diagnosis:hip fx , Patient's progosis: Unable to determine and Goals for treatment: Full Code  Additional follow-up to be provided: prn  Time spent during discussion:20 minutes  Gorden Harms, MD

## 2019-01-11 NOTE — H&P (Signed)
[facility] NAME: Sierra [name]    MR#:  [id number]  DATE OF BIRTH:  June 24, 1936  DATE OF ADMISSION:  01/11/2019  PRIMARY CARE PHYSICIAN: Donnamarie Rossetti, PA-C   REQUESTING/REFERRING PHYSICIAN:   CHIEF COMPLAINT:   Chief Complaint  Patient presents with  . Fall    HISTORY OF PRESENT ILLNESS: Sierra [name]  is a 83 y.o. female with a known history per below which includes protein S and C deficiency-on Coumadin, was shopping earlier today when her legs buckled and she fell in the parking lot of a shopping center, patient developed subsequent left hip pain/inability to stand/ambulate, in the emergency room patient was found to have left femoral neck fracture, family at the bedside, pain is reasonably controlled, INR is currently pending, patient now be admitted for acute left hip fracture status post fall.  PAST MEDICAL HISTORY:   Past Medical History:  Diagnosis Date  . Anginal pain ([place])   . Anxiety   . Diabetes mellitus without complication ([place])   . Hypertension   . Hypothyroidism   . Protein C deficiency ([place])   . Protein S deficiency ([place])     PAST SURGICAL HISTORY:  Past Surgical History:  Procedure Laterality Date  . BACK SURGERY    . BREAST EXCISIONAL BIOPSY Left 2003?   benign  . COLONOSCOPY N/A 09/12/2017   Procedure: COLONOSCOPY;  Surgeon: Lollie Sails, MD;  Location: [facility] ENDOSCOPY;  Service: Endoscopy;  Laterality: N/A;  . COLONOSCOPY WITH PROPOFOL N/A 01/20/2016   Procedure: COLONOSCOPY WITH PROPOFOL;  Surgeon: Lollie Sails, MD;  Location: [facility] ENDOSCOPY;  Service: Endoscopy;  Laterality: N/A;  . FRACTURE SURGERY    . PERIPHERAL VASCULAR CATHETERIZATION N/A 05/13/2015   Procedure: IVC Filter Removal;  Surgeon: Katha Cabal, MD;  Location: [facility] LAB;  Service: Cardiovascular;  Laterality: N/A;  . TONSILLECTOMY      SOCIAL HISTORY:  Social History   Tobacco Use  . Smoking status:  Former Research scientist (life sciences)  . Smokeless tobacco: Never Used  Substance Use Topics  . Alcohol use: No    FAMILY HISTORY:  Family History  Problem Relation Age of Onset  . Breast cancer Sister     DRUG ALLERGIES:  Allergies  Allergen Reactions  . Morphine And Related Other (See Comments)    Agitation and anger  . Gentamicin Other (See Comments)    Kidney function    REVIEW OF SYSTEMS:   CONSTITUTIONAL: No fever, fatigue or weakness.  EYES: No blurred or double vision.  EARS, NOSE, AND THROAT: No tinnitus or ear pain.  RESPIRATORY: No cough, shortness of breath, wheezing or hemoptysis.  CARDIOVASCULAR: No chest pain, orthopnea, edema.  GASTROINTESTINAL: No nausea, vomiting, diarrhea or abdominal pain.  GENITOURINARY: No dysuria, hematuria.  ENDOCRINE: No polyuria, nocturia,  HEMATOLOGY: No anemia, easy bruising or bleeding SKIN: No rash or lesion. MUSCULOSKELETAL: Left hip pain  nEUROLOGIC: No tingling, numbness, weakness.  PSYCHIATRY: No anxiety or depression.   MEDICATIONS AT HOME:  Prior to Admission medications   Medication Sig Start Date End Date Taking? Authorizing Provider  atenolol (TENORMIN) 25 MG tablet Take 25 mg by mouth daily.    [provider]  atorvastatin (LIPITOR) 40 MG tablet Take 40 mg by mouth daily.    [provider]  budesonide (ENTOCORT EC) 3 MG 24 hr capsule Take 3 mg by mouth daily.    [provider]  clidinium-chlordiazePOXIDE (LIBRAX) 5-2.5 MG per capsule  Take 1 capsule by mouth.    [provider]  diphenoxylate-atropine (LOMOTIL) 2.5-0.025 MG per tablet Take 1 tablet by mouth 4 (four) times daily as needed for diarrhea or loose stools.    [provider]  DULoxetine (CYMBALTA) 60 MG capsule Take 60 mg by mouth daily.    [provider]  levothyroxine (SYNTHROID, LEVOTHROID) 88 MCG tablet Take 88 mcg by mouth daily before breakfast.    [provider]  lidocaine (LIDODERM) 5 % Place 1 patch  onto the skin every 12 (twelve) hours. Remove & Discard patch within 12 hours or as directed by MD 10/01/18 10/01/19  Merlyn Lot, MD  lisinopril (PRINIVIL,ZESTRIL) 20 MG tablet Take 20 mg by mouth daily.    [provider]  omeprazole (PRILOSEC) 40 MG capsule Take 40 mg by mouth daily.    [provider]  sitaGLIPtin-metformin (JANUMET) 50-500 MG per tablet Take 2 tablets by mouth 2 (two) times daily with a meal.    [provider]  warfarin (COUMADIN) 5 MG tablet Take 5 mg by mouth daily.    [provider]      PHYSICAL EXAMINATION:   VITAL SIGNS: Blood pressure (!) 141/78, pulse 65, temperature 98.5 F (36.9 C), temperature source Oral, resp. rate 18, height 5' 7.5" (1.715 m), weight 88.9 kg, SpO2 95 %.  GENERAL:  83 y.o.-year-old patient lying in the bed with no acute distress.  EYES: Pupils equal, round, reactive to light and accommodation. No scleral icterus. Extraocular muscles intact.  HEENT: Head atraumatic, normocephalic. Oropharynx and nasopharynx clear.  NECK:  Supple, no jugular venous distention. No thyroid enlargement, no tenderness.  LUNGS: Normal breath sounds bilaterally, no wheezing, rales,rhonchi or crepitation. No use of accessory muscles of respiration.  CARDIOVASCULAR: S1, S2 normal. No murmurs, rubs, or gallops.  ABDOMEN: Soft, nontender, nondistended. Bowel sounds present. No organomegaly or mass.  EXTREMITIES: Left hip pain, decreased range of motion  nEUROLOGIC: Cranial nerves II through XII are intact. Muscle strength 5/5 in all extremities. Sensation intact. Gait not checked.  PSYCHIATRIC: The patient is alert and oriented x 3.  SKIN: No obvious rash, lesion, or ulcer.   LABORATORY PANEL:   CBC Recent Labs  Lab 01/11/19 1421  WBC 6.9  HGB 13.4  HCT 42.6  PLT 184  MCV 92.6  MCH 29.1  MCHC 31.5  RDW 13.6  LYMPHSABS 1.1  MONOABS 0.8  EOSABS 0.1  BASOSABS 0.0    ------------------------------------------------------------------------------------------------------------------  Chemistries  Recent Labs  Lab 01/11/19 1421  NA 139  K 4.1  CL 107  CO2 23  GLUCOSE 103*  BUN 28*  CREATININE 1.44*  CALCIUM 9.0  AST 21  ALT 15  ALKPHOS 47  BILITOT 1.0   ------------------------------------------------------------------------------------------------------------------ estimated creatinine clearance is 36 mL/min (A) (by C-G formula based on SCr of 1.44 mg/dL (H)). ------------------------------------------------------------------------------------------------------------------ No results for input(s): TSH, T4TOTAL, T3FREE, THYROIDAB in the last 72 hours.  Invalid input(s): FREET3   Coagulation profile No results for input(s): INR, PROTIME in the last 168 hours. ------------------------------------------------------------------------------------------------------------------- No results for input(s): DDIMER in the last 72 hours. -------------------------------------------------------------------------------------------------------------------  Cardiac Enzymes No results for input(s): CKMB, TROPONINI, MYOGLOBIN in the last 168 hours.  Invalid input(s): CK ------------------------------------------------------------------------------------------------------------------ Invalid input(s): POCBNP  ---------------------------------------------------------------------------------------------------------------  Urinalysis No results found for: COLORURINE, APPEARANCEUR, LABSPEC, PHURINE, GLUCOSEU, HGBUR, BILIRUBINUR, KETONESUR, PROTEINUR, UROBILINOGEN, NITRITE, LEUKOCYTESUR   RADIOLOGY: Dg Hip Unilat W Or W/o Pelvis 2-3 Views Left  Result Date: 01/11/2019 CLINICAL DATA:  Initial evaluation for acute  trauma, fall. EXAM: DG HIP (WITH OR WITHOUT PELVIS) 2-3V LEFT COMPARISON:  None. FINDINGS: There is an acute fracture extending through the left  femoral neck with mild superior subluxation. Femoral head remains normally position within the acetabulum. Femoral head height maintained. Bony pelvis intact. Limited views of the right hip demonstrate no acute osseous abnormality. Visible soft tissues within normal limits. IMPRESSION: Acute mildly displaced fracture of the left femoral neck. Electronically Signed   By: Jeannine Boga M.D.   On: 01/11/2019 15:21    EKG: Orders placed or performed during the hospital encounter of 01/11/19  . ED EKG  . ED EKG  . EKG 12-Lead  . EKG 12-Lead    IMPRESSION AND PLAN: *Acute left femoral neck fracture status post mechanical fall Admit to regular nursing for bed, orthopedic surgery/Sunny Posey Pronto consulted, adult pain protocol  *Chronic protein S and C deficiency On chronic Coumadin Follow-up on PT/INR, check daily, will need reversal of Coumadin for hip fracture repair on tomorrow  *Chronic diabetes mellitus type 2 Sliding scale insulin with Accu-Cheks per routine  *Chronic benign essential hypertension Stable Continue atenolol, lisinopril  *Chronic hyperlipidemia, unspecified Continue statin therapy  *Chronic hypothyroidism Check TSH and continue Synthroid    All the records are reviewed and case discussed with ED provider. Management plans discussed with the patient, family and they are in agreement.  CODE STATUS:full Code Status History    Date Active Date Inactive Code Status Order ID Comments User Context   05/13/2015 1528 05/13/2015 1910 Full Code [id number]  Schnier, Dolores Lory, MD Inpatient    Advance Directive Documentation     Most Recent Value  Type of Advance Directive  Healthcare Power of [place], Living will  Pre-existing out of facility DNR order (yellow form or pink MOST form)  -  "MOST" Form in Place?  -       TOTAL TIME TAKING CARE OF THIS PATIENT: 45 minutes.    Avel Peace Ryin Schillo M.D on 01/11/2019   Between 7am to 6pm - Pager - [phone]  After 6pm  go to [email or web address] - password [email or web address] [facility]  Office  [phone]  CC: Primary care physician; Whitaker, Rolanda Jay, PA-C   Note: This dictation was prepared with Dragon dictation along with smaller phrase technology. Any transcriptional errors that result from this process are unintentional.

## 2019-01-12 ENCOUNTER — Inpatient Hospital Stay: Payer: Medicare Other | Admitting: Anesthesiology

## 2019-01-12 ENCOUNTER — Encounter
Admission: EM | Disposition: A | Discharge status: Skilled Nursing Facility | Payer: Self-pay | Source: Home / Self Care | Attending: Internal Medicine

## 2019-01-12 ENCOUNTER — Inpatient Hospital Stay: Payer: Medicare Other

## 2019-01-12 ENCOUNTER — Encounter: Payer: Self-pay | Admitting: Anesthesiology

## 2019-01-12 HISTORY — PX: HIP ARTHROPLASTY: SHX981

## 2019-01-12 LAB — GLUCOSE, CAPILLARY
Glucose-Capillary: 122 mg/dL — ABNORMAL HIGH (ref 70–99)
Glucose-Capillary: 149 mg/dL — ABNORMAL HIGH (ref 70–99)
Glucose-Capillary: 128 mg/dL — ABNORMAL HIGH (ref 70–99)
Glucose-Capillary: 133 mg/dL — ABNORMAL HIGH (ref 70–99)
Glucose-Capillary: 138 mg/dL — ABNORMAL HIGH (ref 70–99)
Glucose-Capillary: 128 mg/dL — ABNORMAL HIGH (ref 70–99)
Glucose-Capillary: 157 mg/dL — ABNORMAL HIGH (ref 70–99)

## 2019-01-12 LAB — PROTIME-INR
INR: 1.7 — ABNORMAL HIGH (ref 0.8–1.2)
INR: 1.6 — ABNORMAL HIGH (ref 0.8–1.2)
Prothrombin Time: 20 seconds — ABNORMAL HIGH (ref 11.4–15.2)
Prothrombin Time: 18.4 seconds — ABNORMAL HIGH (ref 11.4–15.2)

## 2019-01-12 LAB — CBC
HCT: 41.9 % (ref 36.0–46.0)
Hemoglobin: 13.7 g/dL (ref 12.0–15.0)
MCH: 29.1 pg (ref 26.0–34.0)
MCHC: 32.7 g/dL (ref 30.0–36.0)
MCV: 89 fL (ref 80.0–100.0)
Platelets: 160 10*3/uL (ref 150–400)
RBC: 4.71 MIL/uL (ref 3.87–5.11)
RDW: 13.3 % (ref 11.5–15.5)
WBC: 8 10*3/uL (ref 4.0–10.5)
nRBC: 0 % (ref 0.0–0.2)

## 2019-01-12 LAB — MRSA PCR SCREENING: MRSA by PCR: NEGATIVE

## 2019-01-12 LAB — TROPONIN I: Troponin I: <0.03 ng/mL (ref <0.03)

## 2019-01-12 LAB — BASIC METABOLIC PANEL
Anion gap: 8 (ref 5–15)
BUN: 18 mg/dL (ref 8–23)
CO2: 23 mmol/L (ref 22–32)
Calcium: 8.7 mg/dL — ABNORMAL LOW (ref 8.9–10.3)
Chloride: 106 mmol/L (ref 98–111)
Creatinine, Ser: 0.9 mg/dL (ref 0.44–1.00)
GFR calc Af Amer: >60 mL/min (ref >60)
GFR calc non Af Amer: >60 mL/min (ref >60)
Glucose, Bld: 167 mg/dL — ABNORMAL HIGH (ref 70–99)
Potassium: 3.7 mmol/L (ref 3.5–5.1)
Sodium: 137 mmol/L (ref 135–145)

## 2019-01-12 SURGERY — HEMIARTHROPLASTY, HIP, DIRECT ANTERIOR APPROACH, FOR FRACTURE
Anesthesia: General | Site: Hip | Laterality: Left

## 2019-01-12 MED ORDER — ROCURONIUM BROMIDE 100 MG/10ML IV SOLN
INTRAVENOUS | Status: DC | PRN
  Administered 2019-01-12: 50 mg via INTRAVENOUS
  Administered 2019-01-12: 10 mg via INTRAVENOUS

## 2019-01-12 MED ORDER — MEPERIDINE HCL 50 MG/ML IJ SOLN: 6.2500 mg | INTRAMUSCULAR | Status: DC | PRN

## 2019-01-12 MED ORDER — BUPIVACAINE LIPOSOME 1.3 % IJ SUSP
INTRAMUSCULAR | Status: AC
  Filled 2019-01-12: qty 20

## 2019-01-12 MED ORDER — SUGAMMADEX SODIUM 200 MG/2ML IV SOLN
INTRAVENOUS | Status: DC | PRN
  Administered 2019-01-12: 177.8 mg via INTRAVENOUS

## 2019-01-12 MED ORDER — BUPIVACAINE HCL (PF) 0.5 % IJ SOLN
INTRAMUSCULAR | Status: AC
  Filled 2019-01-12: qty 30

## 2019-01-12 MED ORDER — LACTATED RINGERS IV SOLN
INTRAVENOUS | Status: DC | PRN
  Administered 2019-01-12: 14:00:00 via INTRAVENOUS

## 2019-01-12 MED ORDER — BUPIVACAINE LIPOSOME 1.3 % IJ SUSP
INTRAMUSCULAR | Status: DC | PRN
  Administered 2019-01-12: 50 mL

## 2019-01-12 MED ORDER — EPHEDRINE SULFATE 50 MG/ML IJ SOLN
INTRAMUSCULAR | Status: DC | PRN
  Administered 2019-01-12 (×2): 10 mg via INTRAVENOUS

## 2019-01-12 MED ORDER — LIDOCAINE HCL (CARDIAC) PF 100 MG/5ML IV SOSY
PREFILLED_SYRINGE | INTRAVENOUS | Status: DC | PRN
  Administered 2019-01-12: 40 mg via INTRAVENOUS

## 2019-01-12 MED ORDER — FENTANYL CITRATE (PF) 100 MCG/2ML IJ SOLN: 25.0000 ug | INTRAMUSCULAR | Status: DC | PRN

## 2019-01-12 MED ORDER — MIDAZOLAM HCL 2 MG/2ML IJ SOLN
INTRAMUSCULAR | Status: AC
  Filled 2019-01-12: qty 2

## 2019-01-12 MED ORDER — GENTAMICIN SULFATE 40 MG/ML IJ SOLN
INTRAMUSCULAR | Status: AC
  Filled 2019-01-12: qty 2

## 2019-01-12 MED ORDER — VITAMIN K1 10 MG/ML IJ SOLN
1.0000 mg | Freq: Once | INTRAVENOUS | Status: AC
  Administered 2019-01-12: 1 mg via INTRAVENOUS
  Filled 2019-01-12: qty 0.1

## 2019-01-12 MED ORDER — TRANEXAMIC ACID 1000 MG/10ML IV SOLN
INTRAVENOUS | Status: AC
  Filled 2019-01-12: qty 10

## 2019-01-12 MED ORDER — TRANEXAMIC ACID 1000 MG/10ML IV SOLN: INTRAVENOUS | Status: DC | PRN

## 2019-01-12 MED ORDER — CEFAZOLIN SODIUM-DEXTROSE 2-3 GM-%(50ML) IV SOLR
INTRAVENOUS | Status: DC | PRN
  Administered 2019-01-12: 2 g via INTRAVENOUS

## 2019-01-12 MED ORDER — OXYCODONE HCL 5 MG PO TABS: 5.0000 mg | ORAL_TABLET | Freq: Once | ORAL | Status: DC | PRN

## 2019-01-12 MED ORDER — SODIUM CHLORIDE 0.9 % IV SOLN
INTRAVENOUS | Status: DC | PRN
  Administered 2019-01-12: 4000 mL

## 2019-01-12 MED ORDER — OXYCODONE HCL 5 MG/5ML PO SOLN: 5.0000 mg | Freq: Once | ORAL | Status: DC | PRN

## 2019-01-12 MED ORDER — PROMETHAZINE HCL 25 MG/ML IJ SOLN: 6.2500 mg | INTRAMUSCULAR | Status: DC | PRN

## 2019-01-12 MED ORDER — PHENYLEPHRINE HCL 10 MG/ML IJ SOLN
INTRAMUSCULAR | Status: DC | PRN
  Administered 2019-01-12 (×4): 100 ug via INTRAVENOUS

## 2019-01-12 MED ORDER — TRANEXAMIC ACID 1000 MG/10ML IV SOLN
INTRAVENOUS | Status: DC | PRN
  Administered 2019-01-12: 1000 mg via INTRAVENOUS

## 2019-01-12 MED ORDER — GLYCOPYRROLATE 0.2 MG/ML IJ SOLN
INTRAMUSCULAR | Status: DC | PRN
  Administered 2019-01-12: 0.2 mg via INTRAVENOUS

## 2019-01-12 MED ORDER — PROPOFOL 10 MG/ML IV BOLUS
INTRAVENOUS | Status: DC | PRN
  Administered 2019-01-12: 100 mg via INTRAVENOUS

## 2019-01-12 MED ORDER — AMLODIPINE BESYLATE 5 MG PO TABS: 2.5000 mg | ORAL_TABLET | Freq: Every day | ORAL | Status: DC

## 2019-01-12 MED ORDER — VASOPRESSIN 20 UNIT/ML IV SOLN
INTRAVENOUS | Status: DC | PRN
  Administered 2019-01-12: 2 [IU] via INTRAVENOUS

## 2019-01-12 MED ORDER — FENTANYL CITRATE (PF) 100 MCG/2ML IJ SOLN
INTRAMUSCULAR | Status: DC | PRN
  Administered 2019-01-12: 25 ug via INTRAVENOUS

## 2019-01-12 MED ORDER — SODIUM CHLORIDE 0.9 % IV SOLN
INTRAVENOUS | Status: DC | PRN
  Administered 2019-01-12 (×2): 30 ug/min via INTRAVENOUS

## 2019-01-12 MED ORDER — FENTANYL CITRATE (PF) 100 MCG/2ML IJ SOLN
INTRAMUSCULAR | Status: AC
  Filled 2019-01-12: qty 2

## 2019-01-12 SURGICAL SUPPLY — 63 items
BLADE SAGITTAL WIDE XTHICK NO (BLADE) ×2 IMPLANT
BLADE SURG SZ10 CARB STEEL (BLADE) ×2 IMPLANT
BNDG COHESIVE 4X5 TAN STRL (GAUZE/BANDAGES/DRESSINGS) ×2 IMPLANT
CANISTER SUCT 1200ML W/VALVE (MISCELLANEOUS) ×2 IMPLANT
CANISTER SUCT 3000ML PPV (MISCELLANEOUS) ×4 IMPLANT
CHLORAPREP W/TINT 26ML (MISCELLANEOUS) ×2 IMPLANT
COVER WAND RF STERILE (DRAPES) ×2 IMPLANT
DERMABOND ADVANCED (GAUZE/BANDAGES/DRESSINGS) ×1
DERMABOND ADVANCED .7 DNX12 (GAUZE/BANDAGES/DRESSINGS) ×1 IMPLANT
DRAPE IMP U-DRAPE 54X76 (DRAPES) ×2 IMPLANT
DRAPE INCISE IOBAN 66X60 STRL (DRAPES) ×2 IMPLANT
DRAPE SHEET LG 3/4 BI-LAMINATE (DRAPES) ×4 IMPLANT
DRAPE SURG 17X11 SM STRL (DRAPES) ×2 IMPLANT
DRAPE TABLE BACK 80X90 (DRAPES) ×2 IMPLANT
DRSG OPSITE POSTOP 4X10 (GAUZE/BANDAGES/DRESSINGS) ×2 IMPLANT
DRSG OPSITE POSTOP 4X8 (GAUZE/BANDAGES/DRESSINGS) ×2 IMPLANT
ELECT BLADE 6.5 EXT (BLADE) ×2 IMPLANT
ELECT CAUTERY BLADE 6.4 (BLADE) ×2 IMPLANT
ELECT REM PT RETURN 9FT ADLT (ELECTROSURGICAL) ×2
ELECTRODE REM PT RTRN 9FT ADLT (ELECTROSURGICAL) ×1 IMPLANT
GAUZE PETRO XEROFOAM 1X8 (MISCELLANEOUS) ×2 IMPLANT
GAUZE SPONGE 4X4 12PLY STRL (GAUZE/BANDAGES/DRESSINGS) ×2 IMPLANT
GLOVE BIOGEL PI IND STRL 8 (GLOVE) ×2 IMPLANT
GLOVE BIOGEL PI INDICATOR 8 (GLOVE) ×2
GLOVE SURG ORTHO 8.0 STRL STRW (GLOVE) ×4 IMPLANT
GOWN STRL REUS W/ TWL LRG LVL3 (GOWN DISPOSABLE) ×1 IMPLANT
GOWN STRL REUS W/ TWL XL LVL3 (GOWN DISPOSABLE) ×1 IMPLANT
GOWN STRL REUS W/TWL LRG LVL3 (GOWN DISPOSABLE) ×1
GOWN STRL REUS W/TWL XL LVL3 (GOWN DISPOSABLE) ×1
HEAD MODULAR ENDO (Orthopedic Implant) ×1 IMPLANT
HEAD UNPLR 50XMDLR STRL HIP (Orthopedic Implant) IMPLANT
HEMOVAC 400ML (MISCELLANEOUS)
KIT DRAIN HEMOVAC JP 7FR 400ML (MISCELLANEOUS) IMPLANT
KIT TURNOVER KIT A (KITS) ×2 IMPLANT
NDL FILTER BLUNT 18X1 1/2 (NEEDLE) ×1 IMPLANT
NDL MAYO CATGUT SZ4 TPR NDL (NEEDLE) ×1 IMPLANT
NDL SAFETY ECLIPSE 18X1.5 (NEEDLE) ×1 IMPLANT
NEEDLE FILTER BLUNT 18X 1/2SAF (NEEDLE) ×1
NEEDLE FILTER BLUNT 18X1 1/2 (NEEDLE) ×1 IMPLANT
NEEDLE HYPO 18GX1.5 SHARP (NEEDLE) ×1
NEEDLE MAYO CATGUT SZ4 (NEEDLE) ×2 IMPLANT
NS IRRIG 1000ML POUR BTL (IV SOLUTION) ×2 IMPLANT
PACK HIP PROSTHESIS (MISCELLANEOUS) ×2 IMPLANT
PILLOW ABDUCTION FOAM SM (MISCELLANEOUS) ×2 IMPLANT
PULSAVAC PLUS IRRIG FAN TIP (DISPOSABLE) ×2
RETRIEVER SUT HEWSON (MISCELLANEOUS) IMPLANT
SLEEVE UNITRAX V40 STD (Orthopedic Implant) ×1 IMPLANT
SOL .9 NS 3000ML IRR  AL (IV SOLUTION) ×1
SOL .9 NS 3000ML IRR UROMATIC (IV SOLUTION) ×1 IMPLANT
STAPLER SKIN PROX 35W (STAPLE) ×2 IMPLANT
STEM ACCOLADE SZ 6 (Hips) ×1 IMPLANT
SUT ETHIBOND #5 BRAIDED 30INL (SUTURE) ×2 IMPLANT
SUT MNCRL 4-0 (SUTURE) ×1
SUT MNCRL 4-0 27XMFL (SUTURE) ×1
SUT VIC AB 0 CT1 36 (SUTURE) ×2 IMPLANT
SUT VIC AB 2-0 CT2 27 (SUTURE) ×4 IMPLANT
SUTURE MNCRL 4-0 27XMF (SUTURE) ×1 IMPLANT
SYR 20CC LL (SYRINGE) ×2 IMPLANT
TAPE MICROFOAM 4IN (TAPE) ×2 IMPLANT
TAPE TRANSPORE STRL 2 31045 (GAUZE/BANDAGES/DRESSINGS) ×2 IMPLANT
TIP BRUSH PULSAVAC PLUS 24.33 (MISCELLANEOUS) ×2 IMPLANT
TIP FAN IRRIG PULSAVAC PLUS (DISPOSABLE) ×1 IMPLANT
TUBE SUCT KAM VAC (TUBING) ×2 IMPLANT

## 2019-01-12 NOTE — Anesthesia Preprocedure Evaluation (Addendum)
Anesthesia Evaluation  Patient identified by MRN, date of birth, ID band Patient awake    Reviewed: Allergy & Precautions, NPO status , Patient's Chart, lab work & pertinent test results  History of Anesthesia Complications Negative for: history of anesthetic complications  Airway Mallampati: III  TM Distance: >3 FB Neck ROM: Full    Dental no notable dental hx.    Pulmonary neg sleep apnea, neg COPD, former smoker,    breath sounds clear to auscultation- rhonchi (-) wheezing      Cardiovascular hypertension, Pt. on medications (-) angina(-) CAD, (-) Past MI, (-) Cardiac Stents and (-) CABG  Rhythm:Regular Rate:Normal - Systolic murmurs and - Diastolic murmurs    Neuro/Psych neg Seizures Anxiety negative neurological ROS     GI/Hepatic negative GI ROS, Neg liver ROS,   Endo/Other  diabetes, Oral Hypoglycemic AgentsHypothyroidism   Renal/GU negative Renal ROS     Musculoskeletal L hip fracture, mechanical fall    Abdominal (+) + obese,   Peds  Hematology negative hematology ROS (+)   Anesthesia Other Findings Past Medical History: No date: Anginal pain ([place]) No date: Anxiety No date: Diabetes mellitus without complication ([name]) No date: Hypertension No date: Hypothyroidism No date: Protein C deficiency ([name]) No date: Protein S deficiency ([place])  Echo shows preserved LV function with an EF of 59%.  Reproductive/Obstetrics                           Anesthesia Physical  Anesthesia Plan  ASA: III  Anesthesia Plan: General   Post-op Pain Management:    Induction: Intravenous  PONV Risk Score and Plan: 2 and Ondansetron and Dexamethasone  Airway Management Planned: Oral ETT  Additional Equipment:   Intra-op Plan:   Post-operative Plan: Extubation in OR  Informed Consent: I have reviewed the patients History and Physical, chart, labs and discussed the procedure including the  risks, benefits and alternatives for the proposed anesthesia with the patient or authorized representative who has indicated his/her understanding and acceptance.     Dental advisory given  Plan Discussed with: CRNA and Anesthesiologist  Anesthesia Plan Comments: (Patient cleared by Cardiology as low to moderate risk.  INR is 1.7)      Anesthesia Quick Evaluation

## 2019-01-12 NOTE — Anesthesia Post-op Follow-up Note (Signed)
Anesthesia QCDR form completed.        

## 2019-01-12 NOTE — Consult Note (Signed)
ORTHOPAEDIC CONSULTATION  REQUESTING PHYSICIAN: Vaughan Basta, *  Chief Complaint:   L hip pain  History of Present Illness: Sierra [name] is a 83 y.o. female who had a fall yesterday while out shopping.  The patient noted immediate hip pain and inability to ambulate. Pain is described as sharp at its worst and a dull ache at its best.  Pain is rated a 10 out of 10 in severity.  Pain is improved with rest and immobilization.  Pain is worse with any sort of movement.  X-rays in the emergency department show a left femoral neck fracture.  Of note, the patient has protein C and S deficiency and is on Coumadin.  She was given 5 mg IV vitamin K overnight and INR was corrected to 1.7 this a.m.  Additionally she underwent recent L5-S1 discectomy a few months ago.  She also has osteoporosis and is on Fosamax.  Overnight, she was found to have A. fib on telemetry as well as a heart rate in the 60s with possible heart conduction block.  Past Medical History:  Diagnosis Date  . Anginal pain ([place])   . Anxiety   . Diabetes mellitus without complication ([place])   . Hypertension   . Hypothyroidism   . Protein C deficiency ([place])   . Protein S deficiency [facility])    Past Surgical History:  Procedure Laterality Date  . BACK SURGERY    . BREAST EXCISIONAL BIOPSY Left 2003?   benign  . COLONOSCOPY N/A 09/12/2017   Procedure: COLONOSCOPY;  Surgeon: Lollie Sails, MD;  Location[facility] ENDOSCOPY;  Service: Endoscopy;  Laterality: N/A;  . COLONOSCOPY WITH PROPOFOL N/A 01/20/2016   Procedure: COLONOSCOPY WITH PROPOFOL;  Surgeon: Lollie Sails, MD;  Location: [facility] ENDOSCOPY;  Service: Endoscopy;  Laterality: N/A;  . FRACTURE SURGERY    . PERIPHERAL VASCULAR CATHETERIZATION N/A 05/13/2015   Procedure: IVC Filter Removal;  Surgeon: Katha Cabal, MD;  Location: [place] [facility] LAB;  Service: Cardiovascular;  Laterality: N/A;  .  TONSILLECTOMY     Social History   Socioeconomic History  . Marital status: Married    Spouse name: Not on file  . Number of children: Not on file  . Years of education: Not on file  . Highest education level: Not on file  Occupational History  . Not on file  Social Needs  . Financial resource strain: Not on file  . Food insecurity:    Worry: Not on file    Inability: Not on file  . Transportation needs:    Medical: Not on file    Non-medical: Not on file  Tobacco Use  . Smoking status: Former Research scientist (life sciences)  . Smokeless tobacco: Never Used  Substance and Sexual Activity  . Alcohol use: No  . Drug use: No  . Sexual activity: Not on file  Lifestyle  . Physical activity:    Days per week: Not on file    Minutes per session: Not on file  . Stress: Not on file  Relationships  . Social connections:    Talks on phone: Not on file    Gets together: Not on file    Attends religious service: Not on file    Active member of club or organization: Not on file    Attends meetings of clubs or organizations: Not on file    Relationship status: Not on file  Other Topics Concern  . Not on file  Social History Narrative  . Not on file  Family History  Problem Relation Age of Onset  . Breast cancer Sister    Allergies  Allergen Reactions  . Gentamicin Other (See Comments)    Kidney function  . Morphine And Related Other (See Comments)    AMS - agitation and delusions **No legal documents to be signed if taking, per family**   Prior to Admission medications   Medication Sig Start Date End Date Taking? Authorizing Provider  alendronate (FOSAMAX) 70 MG tablet Take 70 mg by mouth once a week. 11/20/18  Yes [provider]  amitriptyline (ELAVIL) 10 MG tablet Take 10 mg by mouth at bedtime as needed for sleep. 12/08/18  Yes [provider]  amLODipine (NORVASC) 2.5 MG tablet Take 2.5 mg by mouth daily.   Yes [provider]  APRISO 0.375 g 24 hr capsule Take 1.5  g by mouth daily. 11/26/18  Yes [provider]  atenolol (TENORMIN) 25 MG tablet Take 25 mg by mouth daily.   Yes [provider]  atorvastatin (LIPITOR) 40 MG tablet Take 40 mg by mouth daily.   Yes [provider]  calcium carbonate (OSCAL) 1500 (600 Ca) MG TABS tablet Take 600 mg of elemental calcium by mouth daily.    Yes [provider]  cholecalciferol (VITAMIN D3) 25 MCG (1000 UT) tablet Take 3,000 Units by mouth daily. (taken with calcium)   Yes [provider]  DULoxetine (CYMBALTA) 60 MG capsule Take 60 mg by mouth at bedtime.    Yes [provider]  glipiZIDE (GLUCOTROL XL) 2.5 MG 24 hr tablet Take 2.5 mg by mouth daily. 11/11/18  Yes [provider]  levothyroxine (SYNTHROID, LEVOTHROID) 88 MCG tablet Take 88 mcg by mouth daily before breakfast.   Yes [provider]  lisinopril (PRINIVIL,ZESTRIL) 40 MG tablet Take 40 mg by mouth daily. 12/05/18  Yes [provider]  loperamide (IMODIUM A-D) 2 MG tablet Take 2 mg by mouth daily.   Yes [provider]  Probiotic Product (Porum) CAPS Take 1 capsule by mouth daily.   Yes [provider]  saccharomyces boulardii (FLORASTOR) 250 MG capsule Take 250 mg by mouth daily.   Yes [provider]  sitaGLIPtin-metformin (JANUMET) 50-500 MG per tablet Take 2 tablets by mouth every evening.    Yes [provider]  warfarin (COUMADIN) 1 MG tablet Take 1 mg by mouth at bedtime.   Yes [provider]  warfarin (COUMADIN) 5 MG tablet Take 5 mg by mouth daily.   Yes [provider]  lidocaine (LIDODERM) 5 % Place 1 patch onto the skin every 12 (twelve) hours. Remove & Discard patch within 12 hours or as directed by MD Patient not taking: Reported on 01/11/2019 10/01/18 10/01/19  Merlyn Lot, MD   Recent Labs    01/11/19 1421 01/12/19 0505  WBC 6.9 8.0  HGB 13.4 13.7  HCT 42.6 41.9  PLT 184 160  K 4.1  3.7  CL 107 106  CO2 23 23  BUN 28* 18  CREATININE 1.44* 0.90  GLUCOSE 103* 167*  CALCIUM 9.0 8.7*  INR 3.3* 1.7*   Dg Chest 1 View  Result Date: 01/11/2019 CLINICAL DATA:  Status post fall. Left hip pain. History of hypertension. Former smoker. EXAM: CHEST  1 VIEW COMPARISON:  02/25/2015 FINDINGS: Cardiac silhouette is normal in size. No mediastinal or hilar masses or evidence of adenopathy. Clear lungs.  No pleural effusion or pneumothorax. Skeletal structures are grossly intact. IMPRESSION: No active disease.  Electronically Signed   By: Lajean Manes M.D.   On: 01/11/2019 15:33   Ct Head Wo Contrast  Result Date: 01/11/2019 CLINICAL DATA:  Altered level of consciousness. female with a history of anginal pain, anxiety, diabetes, hypertension, protein C&S deficiency who presents to the ED for left hip pain after fall. Patient was in the parking lot after shopping and fell injuring her left hip. She cannot bear weight since the fall. She denies any other injuries or complaints EXAM: CT HEAD WITHOUT CONTRAST TECHNIQUE: Contiguous axial images were obtained from the base of the skull through the vertex without intravenous contrast. COMPARISON:  None. FINDINGS: Brain: No evidence of acute infarction, hemorrhage, hydrocephalus, extra-axial collection or mass lesion/mass effect. There is age appropriate ventricular and sulcal enlargement. Patchy areas of white matter hypoattenuation noted consistent with mild to moderate chronic microvascular ischemic change. Vascular: No hyperdense vessel or unexpected calcification. Skull: Normal. Negative for fracture or focal lesion. Sinuses/Orbits: Globes and orbits are unremarkable. The visualized sinuses and mastoid air cells are clear. Other: None. IMPRESSION: 1. No acute intracranial abnormalities. 2. Age-appropriate volume loss. Mild to moderate chronic microvascular ischemic change. Electronically Signed   By: Lajean Manes M.D.   On: 01/11/2019 15:32   Dg Hip  Unilat W Or W/o Pelvis 2-3 Views Left  Result Date: 01/11/2019 CLINICAL DATA:  Initial evaluation for acute trauma, fall. EXAM: DG HIP (WITH OR WITHOUT PELVIS) 2-3V LEFT COMPARISON:  None. FINDINGS: There is an acute fracture extending through the left femoral neck with mild superior subluxation. Femoral head remains normally position within the acetabulum. Femoral head height maintained. Bony pelvis intact. Limited views of the right hip demonstrate no acute osseous abnormality. Visible soft tissues within normal limits. IMPRESSION: Acute mildly displaced fracture of the left femoral neck. Electronically Signed   By: Jeannine Boga M.D.   On: 01/11/2019 15:21     Positive ROS: All other systems have been reviewed and were otherwise negative with the exception of those mentioned in the HPI and as above.  Physical Exam: BP (!) 157/86 (BP Location: Left Arm)   Pulse (!) 57   Temp (!) 97.5 F (36.4 C) (Oral)   Resp 18   Ht 5' 7.5" (1.715 m)   Wt 88.9 kg   SpO2 92%   BMI 30.24 kg/m  General:  Alert, no acute distress Psychiatric:  Patient is competent for consent with normal mood and affect   Cardiovascular:  No pedal edema, regular rate and rhythm (no A. fib or bradycardia currently on exam at 7:30 AM) Respiratory:  No wheezing, non-labored breathing GI:  Abdomen is soft and non-tender Skin:  No lesions in the area of chief complaint, no erythema Neurologic:  Sensation intact distally, CN grossly intact Lymphatic:  No axillary or cervical lymphadenopathy  Orthopedic Exam:  LLE: + DF/PF/EHL SILT grossly over foot Foot wwp +Log roll/axial load   X-rays:  As above: L displaced femoral neck fracture  Assessment/Plan: Dimitria Ketchum is a 83 y.o. female with a L displaced femoral neck fracture   1. I discussed the various treatment options including both surgical and non-surgical management of her fracture with the patient. We discussed the high risk of perioperative complications  due to patient's age and multiple medical co-morbidities. After discussion of risks, benefits, and alternatives to surgery, the patient was in agreement to proceed with surgery. The goals of surgery would be to provide adequate pain relief and allow for mobilization. Plan for surgery is right hip hemiarthroplasty  later this afternoon. 2. NPO until OR 3.  Recheck INR around 10 AM. 4. Admitted to Hospitalist service 5.  Patient was noted to have A. fib last night as well as heart rate in the 30s with possible heart block.  Echo was ordered last night.  Cardiology consult placed today.  Appreciate any evaluation and recommendations prior to planned OR time around 1 pM today.       Leim Fabry   01/12/2019 7:55 AM

## 2019-01-12 NOTE — H&P (Signed)
H&P reviewed. No significant changes noted from my consult note.

## 2019-01-12 NOTE — Progress Notes (Addendum)
POSTOP PLAN s/p hip hemiarthroplasty: - PT/OT on POD#1 - WBAT on operative extremity. Posterior hip precautions - Ancef  x 24 hours - DVT ppx: Resume home Coumadin dosing starting tonight. Lovenox 40mg /day to start on POD#1 until INR is therapeutic. - Pain control: Tylenol scheduled + oxycodone PO prn + dilaudid iv for breakthrough - CBC/BMP/INR daily prn - Remove Foley on POD#1

## 2019-01-12 NOTE — Progress Notes (Signed)
[facility] NAME: Sierra [name]    MR#:  [id number]  DATE OF BIRTH:  10/22/1936  SUBJECTIVE:  CHIEF COMPLAINT:   Chief Complaint  Patient presents with  . Fall   Came after accidental fall and hip pain.  Noted to have a fracture.  REVIEW OF SYSTEMS:  CONSTITUTIONAL: No fever, fatigue or weakness.  EYES: No blurred or double vision.  EARS, NOSE, AND THROAT: No tinnitus or ear pain.  RESPIRATORY: No cough, shortness of breath, wheezing or hemoptysis.  CARDIOVASCULAR: No chest pain, orthopnea, edema.  GASTROINTESTINAL: No nausea, vomiting, diarrhea or abdominal pain.  GENITOURINARY: No dysuria, hematuria.  ENDOCRINE: No polyuria, nocturia,  HEMATOLOGY: No anemia, easy bruising or bleeding SKIN: No rash or lesion. MUSCULOSKELETAL: Left hip joint pain. NEUROLOGIC: No tingling, numbness, weakness.  PSYCHIATRY: No anxiety or depression.   ROS  DRUG ALLERGIES:   Allergies  Allergen Reactions  . Gentamicin Other (See Comments)    Kidney function  . Morphine And Related Other (See Comments)    AMS - agitation and delusions **No legal documents to be signed if taking, per family**    VITALS:  Blood pressure 111/78, pulse 66, temperature (!) 97.1 F (36.2 C), resp. rate 13, height 5' 7.5" (1.715 m), weight 88.9 kg, SpO2 98 %.  PHYSICAL EXAMINATION:  GENERAL:  83 y.o.-year-old patient lying in the bed with no acute distress.  EYES: Pupils equal, round, reactive to light and accommodation. No scleral icterus. Extraocular muscles intact.  HEENT: Head atraumatic, normocephalic. Oropharynx and nasopharynx clear.  NECK:  Supple, no jugular venous distention. No thyroid enlargement, no tenderness.  LUNGS: Normal breath sounds bilaterally, no wheezing, rales,rhonchi or crepitation. No use of accessory muscles of respiration.  CARDIOVASCULAR: S1, S2 normal. No murmurs, rubs, or gallops.  ABDOMEN: Soft, nontender, nondistended. Bowel  sounds present. No organomegaly or mass.  EXTREMITIES: No pedal edema, cyanosis, or clubbing.  NEUROLOGIC: Cranial nerves II through XII are intact. Muscle strength 4/5 in all extremities except for left lower extremity which is limited due to pain secondary to fracture.. Sensation intact. Gait not checked.  PSYCHIATRIC: The patient is alert and oriented x 3.  SKIN: No obvious rash, lesion, or ulcer.   Physical Exam LABORATORY PANEL:   CBC Recent Labs  Lab 01/12/19 0505  WBC 8.0  HGB 13.7  HCT 41.9  PLT 160   ------------------------------------------------------------------------------------------------------------------  Chemistries  Recent Labs  Lab 01/11/19 1421 01/12/19 0505  NA 139 137  K 4.1 3.7  CL 107 106  CO2 23 23  GLUCOSE 103* 167*  BUN 28* 18  CREATININE 1.44* 0.90  CALCIUM 9.0 8.7*  AST 21  --   ALT 15  --   ALKPHOS 47  --   BILITOT 1.0  --    ------------------------------------------------------------------------------------------------------------------  Cardiac Enzymes Recent Labs  Lab 01/12/19 1017  TROPONINI <0.03   ------------------------------------------------------------------------------------------------------------------  RADIOLOGY:  Dg Chest 1 View  Result Date: 01/11/2019 CLINICAL DATA:  Status post fall. Left hip pain. History of hypertension. Former smoker. EXAM: CHEST  1 VIEW COMPARISON:  02/25/2015 FINDINGS: Cardiac silhouette is normal in size. No mediastinal or hilar masses or evidence of adenopathy. Clear lungs.  No pleural effusion or pneumothorax. Skeletal structures are grossly intact. IMPRESSION: No active disease. Electronically Signed   By: Lajean Manes M.D.   On: 01/11/2019 15:33   Ct Head Wo Contrast  Result Date: 01/11/2019 CLINICAL DATA:  Altered level of consciousness. female with a  history of anginal pain, anxiety, diabetes, hypertension, protein C&S deficiency who presents to the ED for left hip pain after fall.  Patient was in the parking lot after shopping and fell injuring her left hip. She cannot bear weight since the fall. She denies any other injuries or complaints EXAM: CT HEAD WITHOUT CONTRAST TECHNIQUE: Contiguous axial images were obtained from the base of the skull through the vertex without intravenous contrast. COMPARISON:  None. FINDINGS: Brain: No evidence of acute infarction, hemorrhage, hydrocephalus, extra-axial collection or mass lesion/mass effect. There is age appropriate ventricular and sulcal enlargement. Patchy areas of white matter hypoattenuation noted consistent with mild to moderate chronic microvascular ischemic change. Vascular: No hyperdense vessel or unexpected calcification. Skull: Normal. Negative for fracture or focal lesion. Sinuses/Orbits: Globes and orbits are unremarkable. The visualized sinuses and mastoid air cells are clear. Other: None. IMPRESSION: 1. No acute intracranial abnormalities. 2. Age-appropriate volume loss. Mild to moderate chronic microvascular ischemic change. Electronically Signed   By: Lajean Manes M.D.   On: 01/11/2019 15:32   Dg Hip Unilat W Or W/o Pelvis 2-3 Views Left  Result Date: 01/11/2019 CLINICAL DATA:  Initial evaluation for acute trauma, fall. EXAM: DG HIP (WITH OR WITHOUT PELVIS) 2-3V LEFT COMPARISON:  None. FINDINGS: There is an acute fracture extending through the left femoral neck with mild superior subluxation. Femoral head remains normally position within the acetabulum. Femoral head height maintained. Bony pelvis intact. Limited views of the right hip demonstrate no acute osseous abnormality. Visible soft tissues within normal limits. IMPRESSION: Acute mildly displaced fracture of the left femoral neck. Electronically Signed   By: Jeannine Boga M.D.   On: 01/11/2019 15:21    ASSESSMENT AND PLAN:   Active Problems:   Hip fracture ([name])  *Acute left femoral neck fracture status post mechanical fall orthopedic surgery/Sunny Posey Pronto  consulted, adult pain protocol Cardiology has cleared the patient for surgery.  *Chronic protein S and C deficiency On chronic Coumadin Follow-up on PT/INR, check daily, reversed Coumadin with giving vitamin K. We may need to resume soon after surgery when allowed.  *Chronic diabetes mellitus type 2 Sliding scale insulin with Accu-Cheks per routine  *Paroxysmal atrial fibrillation She had episode of A. fib last night, cardiology consult was called in, saw the patient and suggested no further work-up but proceed for surgery and resume Coumadin as soon as possible after surgery when allowed.  *Chronic benign essential hypertension Stable Continue atenolol, lisinopril  *Chronic hyperlipidemia, unspecified Continue statin therapy  *Chronic hypothyroidism Check TSH and continue Synthroid   All the records are reviewed and case discussed with Care Management/Social Workerr. Management plans discussed with the patient, family and they are in agreement.  CODE STATUS: Full  TOTAL TIME TAKING CARE OF THIS PATIENT: 35 minutes.     POSSIBLE D/C IN 1-2 DAYS, DEPENDING ON CLINICAL CONDITION.   Vaughan Basta M.D on 01/12/2019   Between 7am to 6pm - Pager - [phone]  After 6pm go to [email or web address] - password [email or web address] [facility]  Office  [phone]  CC: Primary care physician; Whitaker, Rolanda Jay, PA-C  Note: This dictation was prepared with Dragon dictation along with smaller phrase technology. Any transcriptional errors that result from this process are unintentional.

## 2019-01-12 NOTE — Transfer of Care (Signed)
Immediate Anesthesia Transfer of Care Note  Patient: Sierra [name]  Procedure(s) Performed: ARTHROPLASTY BIPOLAR HIP (HEMIARTHROPLASTY), LEFT (Left Hip)  Patient Location: PACU  Anesthesia Type:General  Level of Consciousness: sedated  Airway & Oxygen Therapy: Patient Spontanous Breathing and Patient connected to face mask oxygen  Post-op Assessment: Report given to RN and Post -op Vital signs reviewed and stable  Post vital signs: Reviewed and stable  Last Vitals:  Vitals Value Taken Time  BP 111/78 01/12/2019  5:15 PM  Temp 36.2 C 01/12/2019  5:15 PM  Pulse 63 01/12/2019  5:22 PM  Resp 11 01/12/2019  5:22 PM  SpO2 100 % 01/12/2019  5:22 PM  Vitals shown include unvalidated device data.  Last Pain:  Vitals:   01/12/19 1100  TempSrc:   PainSc: 3          Complications: No apparent anesthesia complications

## 2019-01-12 NOTE — Anesthesia Procedure Notes (Addendum)
Date/Time: 01/12/2019 2:20 PM Performed by: Allean Found, CRNA Pre-anesthesia Checklist: Patient identified, Emergency Drugs available, Suction available, Patient being monitored and Timeout performed Patient Re-evaluated:Patient Re-evaluated prior to induction Oxygen Delivery Method: Nasal cannula Placement Confirmation: positive ETCO2

## 2019-01-12 NOTE — Consult Note (Signed)
Cardiology Consultation Note    Patient ID: Sierra [name], MRN: [id number], DOB/AGE: 07/15/1936 83 y.o. Admit date: 01/11/2019   Date of Consult: 01/12/2019 Primary Physician: Sierra Rossetti, PA-C Primary Cardiologist: Dr. Nehemiah Massed  Chief Complaint: hip pain Reason for Consultation: afib Requesting MD: Dr. Posey Pronto  HPI: Sierra [name] is a 83 y.o. female with history of hypertension, protein S and protein C deficiency on Coumadin, diabetes, hyperlipidemia who was admitted after suffering a left hip fracture.  In the emergency room she was noted to have a left femoral neck fracture.  She is being considered for surgical correction of this.  On telemetry last p.m. was noted to have what was felt to be atrial fibrillation/flutter.  She currently is in sinus rhythm with first-degree AV block.  Initial EKG shows sinus rhythm with right bundle branch block and left anterior fascicular block.  She has had a noninvasive work-up per cardiology as an outpatient including a functional study in October of last year showing no ischemia with preserved LV function.  Ejection fraction was 59%.  It is of note that in 2016 she had an episode of transient atrial fibrillation with controlled ventricular response prior to an orthopedic surgery.  She did well with that surgical intervention.  She denies any chest pain.  She denied any chest pain during the recent episode.  She denies syncope or presyncope.  She underwent a neurosurgical intervention recently and is in rehab for this. Past Medical History:  Diagnosis Date  . Anginal pain ([place])   . Anxiety   . Diabetes mellitus without complication ([place])   . Hypertension   . Hypothyroidism   . Protein C deficiency ([place])   . Protein S deficiency [facility])       Surgical History:  Past Surgical History:  Procedure Laterality Date  . BACK SURGERY    . BREAST EXCISIONAL BIOPSY Left 2003?   benign  . COLONOSCOPY N/A 09/12/2017   Procedure: COLONOSCOPY;  Surgeon:  Lollie Sails, MD;  Location[facility] END[facility]COPY;  Service: Endoscopy;  Laterality: N/A;  . COLONOSCOPY WITH PROPOFOL N/A 01/20/2016   Procedure: COLONOSCOPY WITH PROPOFOL;  Surgeon: Lollie Sails, MD;  Location[facility] ENDOSCOPY;  Service: Endoscopy;  Laterality: N/A;  . FRACTURE SURGERY    . PERIPHERAL VASCULAR CATHETERIZATION N/A 05/13/2015   Procedure: IVC Filter Removal;  Surgeon: Katha Cabal, MD;  Location: [place] CV LAB;  Service: Cardiovascular;  Laterality: N/A;  . TONSILLECTOMY       Home Meds: Prior to Admission medications   Medication Sig Start Date End Date Taking? Authorizing Provider  alendronate (FOSAMAX) 70 MG tablet Take 70 mg by mouth once a week. 11/20/18  Yes [provider]  amitriptyline (ELAVIL) 10 MG tablet Take 10 mg by mouth at bedtime as needed for sleep. 12/08/18  Yes [provider]  amLODipine (NORVASC) 2.5 MG tablet Take 2.5 mg by mouth daily.   Yes [provider]  APRISO 0.375 g 24 hr capsule Take 1.5 g by mouth daily. 11/26/18  Yes [provider]  atenolol (TENORMIN) 25 MG tablet Take 25 mg by mouth daily.   Yes [provider]  atorvastatin (LIPITOR) 40 MG tablet Take 40 mg by mouth daily.   Yes [provider]  calcium carbonate (OSCAL) 1500 (600 Ca) MG TABS tablet Take 600 mg of elemental calcium by mouth daily.    Yes [provider]  cholecalciferol (VITAMIN D3) 25 MCG (1000 UT) tablet Take 3,000 Units  by mouth daily. (taken with calcium)   Yes [provider]  DULoxetine (CYMBALTA) 60 MG capsule Take 60 mg by mouth at bedtime.    Yes [provider]  glipiZIDE (GLUCOTROL XL) 2.5 MG 24 hr tablet Take 2.5 mg by mouth daily. 11/11/18  Yes [provider]  levothyroxine (SYNTHROID, LEVOTHROID) 88 MCG tablet Take 88 mcg by mouth daily before breakfast.   Yes [provider]  lisinopril (PRINIVIL,ZESTRIL) 40 MG tablet Take 40 mg by mouth daily. 12/05/18   Yes [provider]  loperamide (IMODIUM A-D) 2 MG tablet Take 2 mg by mouth daily.   Yes [provider]  Probiotic Product ([place]) CAPS Take 1 capsule by mouth daily.   Yes [provider]  saccharomyces boulardii (FLORASTOR) 250 MG capsule Take 250 mg by mouth daily.   Yes [provider]  sitaGLIPtin-metformin (JANUMET) 50-500 MG per tablet Take 2 tablets by mouth every evening.    Yes [provider]  warfarin (COUMADIN) 1 MG tablet Take 1 mg by mouth at bedtime.   Yes [provider]  warfarin (COUMADIN) 5 MG tablet Take 5 mg by mouth daily.   Yes [provider]  lidocaine (LIDODERM) 5 % Place 1 patch onto the skin every 12 (twelve) hours. Remove & Discard patch within 12 hours or as directed by MD Patient not taking: Reported on 01/11/2019 10/01/18 10/01/19  Merlyn Lot, MD    Inpatient Medications:  . atenolol  25 mg Oral Daily  . atorvastatin  40 mg Oral Daily  . budesonide  3 mg Oral Daily  . clidinium-chlordiazePOXIDE  1 capsule Oral TID AC & HS  . docusate sodium  100 mg Oral BID  . DULoxetine  60 mg Oral Daily  . insulin aspart  0-15 Units Subcutaneous TID WC  . insulin aspart  0-5 Units Subcutaneous QHS  . levothyroxine  88 mcg Oral QAC breakfast  . lidocaine  1 patch Transdermal Q12H  . linagliptin  5 mg Oral Daily  . lisinopril  20 mg Oral Daily  . pantoprazole  40 mg Oral Daily  . sodium chloride flush  3 mL Intravenous Q12H   . sodium chloride    .  ceFAZolin (ANCEF) IV      Allergies:  Allergies  Allergen Reactions  . Gentamicin Other (See Comments)    Kidney function  . Morphine And Related Other (See Comments)    AMS - agitation and delusions **No legal documents to be signed if taking, per family**    Social History   Socioeconomic History  . Marital status: Married    Spouse name: Not on file  . Number of children: Not on file  . Years of education: Not on file  .  Highest education level: Not on file  Occupational History  . Not on file  Social Needs  . Financial resource strain: Not on file  . Food insecurity:    Worry: Not on file    Inability: Not on file  . Transportation needs:    Medical: Not on file    Non-medical: Not on file  Tobacco Use  . Smoking status: Former Research scientist (life sciences)  . Smokeless tobacco: Never Used  Substance and Sexual Activity  . Alcohol use: No  . Drug use: No  . Sexual activity: Not on file  Lifestyle  . Physical activity:    Days per week: Not on file    Minutes per session: Not on file  .  Stress: Not on file  Relationships  . Social connections:    Talks on phone: Not on file    Gets together: Not on file    Attends religious service: Not on file    Active member of club or organization: Not on file    Attends meetings of clubs or organizations: Not on file    Relationship status: Not on file  . Intimate partner violence:    Fear of current or ex partner: Not on file    Emotionally abused: Not on file    Physically abused: Not on file    Forced sexual activity: Not on file  Other Topics Concern  . Not on file  Social History Narrative  . Not on file     Family History  Problem Relation Age of Onset  . Breast cancer Sister      Review of Systems: A 12-system review of systems was performed and is negative except as noted in the HPI.  Labs: No results for input(s): CKTOTAL, CKMB, TROPONINI in the last 72 hours. Lab Results  Component Value Date   WBC 8.0 01/12/2019   HGB 13.7 01/12/2019   HCT 41.9 01/12/2019   MCV 89.0 01/12/2019   PLT 160 01/12/2019    Recent Labs  Lab 01/11/19 1421 01/12/19 0505  NA 139 137  K 4.1 3.7  CL 107 106  CO2 23 23  BUN 28* 18  CREATININE 1.44* 0.90  CALCIUM 9.0 8.7*  PROT 7.1  --   BILITOT 1.0  --   ALKPHOS 47  --   ALT 15  --   AST 21  --   GLUCOSE 103* 167*   No results found for: CHOL, HDL, LDLCALC, TRIG No results found for:  DDIMER  Radiology/Studies:  Dg Chest 1 View  Result Date: 01/11/2019 CLINICAL DATA:  Status post fall. Left hip pain. History of hypertension. Former smoker. EXAM: CHEST  1 VIEW COMPARISON:  02/25/2015 FINDINGS: Cardiac silhouette is normal in size. No mediastinal or hilar masses or evidence of adenopathy. Clear lungs.  No pleural effusion or pneumothorax. Skeletal structures are grossly intact. IMPRESSION: No active disease. Electronically Signed   By: Lajean Manes M.D.   On: 01/11/2019 15:33   Ct Head Wo Contrast  Result Date: 01/11/2019 CLINICAL DATA:  Altered level of consciousness. female with a history of anginal pain, anxiety, diabetes, hypertension, protein C&S deficiency who presents to the ED for left hip pain after fall. Patient was in the parking lot after shopping and fell injuring her left hip. She cannot bear weight since the fall. She denies any other injuries or complaints EXAM: CT HEAD WITHOUT CONTRAST TECHNIQUE: Contiguous axial images were obtained from the base of the skull through the vertex without intravenous contrast. COMPARISON:  None. FINDINGS: Brain: No evidence of acute infarction, hemorrhage, hydrocephalus, extra-axial collection or mass lesion/mass effect. There is age appropriate ventricular and sulcal enlargement. Patchy areas of white matter hypoattenuation noted consistent with mild to moderate chronic microvascular ischemic change. Vascular: No hyperdense vessel or unexpected calcification. Skull: Normal. Negative for fracture or focal lesion. Sinuses/Orbits: Globes and orbits are unremarkable. The visualized sinuses and mastoid air cells are clear. Other: None. IMPRESSION: 1. No acute intracranial abnormalities. 2. Age-appropriate volume loss. Mild to moderate chronic microvascular ischemic change. Electronically Signed   By: Lajean Manes M.D.   On: 01/11/2019 15:32   US Venous Img Lower Unilateral Left  Result Date: 12/14/2018 CLINICAL DATA:  Left leg swelling EXAM:  LEFT LOWER EXTREMITY VENOUS DUPLEX ULTRASOUND TECHNIQUE: Doppler venous assessment of the left lower extremity deep venous system was performed, including characterization of spectral flow, compressibility, and phasicity. COMPARISON:  None. FINDINGS: There is complete compressibility of the left common femoral, femoral, and popliteal veins. Doppler analysis demonstrates respiratory phasicity and augmentation of flow with calf compression. No obvious superficial vein or calf vein thrombosis. IMPRESSION: No evidence of left lower extremity DVT. Electronically Signed   By: Marybelle Killings M.D.   On: 12/14/2018 15:59   Dg Hip Unilat W Or W/o Pelvis 2-3 Views Left  Result Date: 01/11/2019 CLINICAL DATA:  Initial evaluation for acute trauma, fall. EXAM: DG HIP (WITH OR WITHOUT PELVIS) 2-3V LEFT COMPARISON:  None. FINDINGS: There is an acute fracture extending through the left femoral neck with mild superior subluxation. Femoral head remains normally position within the acetabulum. Femoral head height maintained. Bony pelvis intact. Limited views of the right hip demonstrate no acute osseous abnormality. Visible soft tissues within normal limits. IMPRESSION: Acute mildly displaced fracture of the left femoral neck. Electronically Signed   By: Jeannine Boga M.D.   On: 01/11/2019 15:21    Wt Readings from Last 3 Encounters:  01/11/19 88.9 kg  10/01/18 88.9 kg  09/12/17 88 kg    EKG: Sinus rhythm with right bundle branch block with left anterior fascicular block.  No ischemia.  Physical Exam:  Blood pressure (!) 157/86, pulse (!) 57, temperature (!) 97.5 F (36.4 C), temperature source Oral, resp. rate 18, height 5' 7.5" (1.715 m), weight 88.9 kg, SpO2 92 %. Body mass index is 30.24 kg/m. General: Well developed, well nourished, in no acute distress. Head: Normocephalic, atraumatic, sclera non-icteric, no xanthomas, nares are without discharge.  Neck: Negative for carotid bruits. JVD not  elevated. Lungs: Clear bilaterally to auscultation without wheezes, rales, or rhonchi. Breathing is unlabored. Heart: RRR with S1 S2. No murmurs, rubs, or gallops appreciated. Abdomen: Soft, non-tender, non-distended with normoactive bowel sounds. No hepatomegaly. No rebound/guarding. No obvious abdominal masses. Msk:  Strength and tone appear normal for age. Extremities: No clubbing or cyanosis. No edema.  Distal pedal pulses are 2+ and equal bilaterally. Neuro: Alert and oriented X 3. No facial asymmetry. No focal deficit. Moves all extremities spontaneously. Psych:  Responds to questions appropriately with a normal affect.     Assessment and Plan  Patient with history of hypertension, protein S and C deficiency, history of transient atrial fib/flutter in the past around the time of surgical intervention, history of a recent functional study showing normal LV function with no regional wall motion abnormality no ischemia admitted with left hip fracture.  Is of note she had atrial fibrillation prior to a surgical orthopedic intervention in 2016.  She had a vena cava filter and temporarily but this is been removed.  She has and had no chest pain prior to this event.  She is limited in her activity due to her recent intervention for sciatica however is active in physical therapy without any chest pain or shortness of breath.  She is in sinus rhythm at present.  Does not appear to require any further noninvasive evaluation.  She is at low to moderate risk for surgery for age-matched cohorts.  Would proceed with surgery with routine cardiac monitoring.  Has a normal LV function.  INR was 3.3 on admission currently 1.7.  Would continue with atenolol at 25 mg daily, atorvastatin 40 mg daily, lisinopril 20 mg daily, continue to hold warfarin.  Would resume  as soon as possible post procedure. Signed, Teodoro Spray MD 01/12/2019, 7:58 AM Pager: [phone]

## 2019-01-12 NOTE — Op Note (Addendum)
DATE OF SURGERY: 01/12/2019  PREOPERATIVE DIAGNOSIS: Left femoral neck fracture  POSTOPERATIVE DIAGNOSIS: Left femoral neck fracture  PROCEDURE: Left hip hemiarthroplasty  SURGEON: Cato Mulligan, MD  ASSISTANT: Olean Ree Sillmon, RNFA  ANESTHESIA: spinal  EBL: 200 cc  COMPONENTS:  Stryker - Accolade II Size 6 Stem Stryker - Unitrax 49mm head with neutral neck   INDICATIONS: Sierra [name] is a 83 y.o. female who sustained a displaced femoral neck fracture after a fall. Risks and benefits of hip hemiarthroplasty were explained to the patient and/or family. Risks include but are not limited to bleeding, infection, injury to tissues, nerves, vessels, periprosthetic infection, dislocation, limb length discrepancy and risks of anesthesia. The patient and/or family understands these risks, has completed an informed consent and wishes to proceed.   PROCEDURE:  The patient was identified in the preoperative holding area and the operative extremity was marked.  The patient was then transferred to the operating room suite and mobilized from the hospital gurney to the operating room table. Anesthesia was administered without complication. The patient was then transitioned to a lateral position.  All bony prominences were padded per protocol.  An axillary roll was placed.  Careful attention was paid to the contralateral side peroneal nerve, which was free from pressure with use of appropriate padding and blankets. A time-out was performed to confirm the patient's identity and the correct laterality of surgery. The patient was then prepped and draped in the usual sterile fashion. Appropriate pre-operative antibiotics were administered.    An incision that centered on the posterior tip of the greater trochanter with a posterior curve was made. Dissection was carried down through the subcutaneous tissue.  Careful attention was made to maintain hemostasis using electrocautery.  Dissection brought [place] to the  level of the deep fascia where the gluteus maximus muscle and proximal portion of the IT band were identified.  The proximal region of the IT band was incised in linear fashion and this incision was extended proximally in a curvilinear fashion to split the gluteus maximus muscle parallel to its fibers to minimize bleeding.  This was accomplished using a combination of bovie electrocautery as well as blunt dissection.  The trochanteric bursa was then visualized and dissected from anterior to posterior. A blunt homan retractor was placed beneath the abductors. The piriformis tendon and short external rotators were visualized. Bovie electrocautery was used to cut these with the capsule as one L-shaped flap. This was tagged at the corner with #5 Ethibond. At this point, the femoral neck fracture was visualized. An oscillating saw was used to make a new neck cut approximately 79mm above the lesser tuberosity with the use of a neck cut guide. The head was then freed from its remaining soft tissue attachments and measured. The head trial was then inserted into the acetabulum and the appropriate sized head was selected.    We then turned our attention to preparing the femoral canal. First, a box cut was performed utilizing the box osteotome. A canal finder was inserted by hand and sequential broaching was then performed. The calcar planer was inserted onto the broach and used to smooth the calcar appropriately.  A trial stem, neutral neck, and head were inserted into the acetabulum and placed through range of motion. Intraoperative radiographs were obtained to assess component position and leg length. The hip was again dislocated and the femoral trial components were removed.  The actual stem was inserted into the femoral canal and then driven onto the calcar. The  trial head was then again inserted on the femoral component and found to be appropriate. They were then removed and the permanent head/neck was Morse tapered  onto the femoral stem and then reduced into the acetabulum.    The hip stability and length were reassessed and found to be satisfactory.  The wound was then copiously irrigated with normal saline solution. The tagged sutures of the capsule and piriformis were sewn to the gluteus medius tendon. This adequately closed the hip capsule. The IT band and gluteus maximus fascia were then closed with 0-Vicryl in a running, locked fashion. A mixture of Exparil and bupivicaine was administered.  The subdermal layer was closed with 2-0 Vicryl in a buried interrupted fashion. Skin was approximated staples.  The wound was then covered with Xeroform and Honeycomb dressing.  An abduction pillow was placed. The patient was mobilized from the lateral position back to supine on the operating room table and then awakened from anesthesia without complication.  POSTOPERATIVE PLAN: The patient will be WBAT on operative extremity. Resume home Coumadin dosing starting tonight. Lovenox 40mg /day to start on POD#1 until INR is therapeutic. Ancef x 24 hours. PT/OT on POD#1. Posterior hip precautions.

## 2019-01-12 NOTE — Anesthesia Postprocedure Evaluation (Signed)
Anesthesia Post Note  Patient: Sierra [name]  Procedure(s) Performed: ARTHROPLASTY BIPOLAR HIP (HEMIARTHROPLASTY), LEFT (Left Hip)  Patient location during evaluation: PACU Anesthesia Type: General Level of consciousness: awake and alert Pain management: pain level controlled Vital Signs Assessment: post-procedure vital signs reviewed and stable Respiratory status: spontaneous breathing, nonlabored ventilation, respiratory function stable and patient connected to nasal cannula oxygen Cardiovascular status: blood pressure returned to baseline and stable Postop Assessment: no apparent nausea or vomiting Anesthetic complications: no     Last Vitals:  Vitals:   01/12/19 1815 01/12/19 1830  BP: (!) 146/72 (!) 143/65  Pulse: 63 61  Resp: 15 14  Temp:  (!) 36.4 C  SpO2: 90% 99%    Last Pain:  Vitals:   01/12/19 1830  TempSrc:   PainSc: 0-No pain                 Martha Clan

## 2019-01-13 LAB — GLUCOSE, CAPILLARY
Glucose-Capillary: 181 mg/dL — ABNORMAL HIGH (ref 70–99)
Glucose-Capillary: 190 mg/dL — ABNORMAL HIGH (ref 70–99)
Glucose-Capillary: 215 mg/dL — ABNORMAL HIGH (ref 70–99)
Glucose-Capillary: 177 mg/dL — ABNORMAL HIGH (ref 70–99)

## 2019-01-13 LAB — BASIC METABOLIC PANEL
Anion gap: 10 (ref 5–15)
BUN: 13 mg/dL (ref 8–23)
CO2: 20 mmol/L — ABNORMAL LOW (ref 22–32)
Calcium: 7.8 mg/dL — ABNORMAL LOW (ref 8.9–10.3)
Chloride: 105 mmol/L (ref 98–111)
Creatinine, Ser: 0.88 mg/dL (ref 0.44–1.00)
GFR calc Af Amer: >60 mL/min (ref >60)
GFR calc non Af Amer: >60 mL/min (ref >60)
Glucose, Bld: 197 mg/dL — ABNORMAL HIGH (ref 70–99)
Potassium: 3.9 mmol/L (ref 3.5–5.1)
Sodium: 135 mmol/L (ref 135–145)

## 2019-01-13 LAB — MAGNESIUM: Magnesium: 1.5 mg/dL — ABNORMAL LOW (ref 1.7–2.4)

## 2019-01-13 MED ORDER — ONDANSETRON HCL 4 MG PO TABS: 4.0000 mg | ORAL_TABLET | Freq: Four times a day (QID) | ORAL | Status: DC | PRN

## 2019-01-13 MED ORDER — MAGNESIUM OXIDE 400 (241.3 MG) MG PO TABS
400.0000 mg | ORAL_TABLET | Freq: Every day | ORAL | Status: DC
  Administered 2019-01-13 – 2019-01-15 (×3): 400 mg via ORAL
  Filled 2019-01-13 (×3): qty 1

## 2019-01-13 MED ORDER — RISAQUAD PO CAPS
1.0000 | ORAL_CAPSULE | Freq: Every day | ORAL | Status: DC
  Administered 2019-01-14 – 2019-01-15 (×2): 1 via ORAL
  Filled 2019-01-13 (×2): qty 1

## 2019-01-13 MED ORDER — PHENOL 1.4 % MT LIQD
1.0000 | OROMUCOSAL | Status: DC | PRN
  Filled 2019-01-13: qty 177

## 2019-01-13 MED ORDER — SACCHAROMYCES BOULARDII 250 MG PO CAPS
250.0000 mg | ORAL_CAPSULE | Freq: Every day | ORAL | Status: DC
  Administered 2019-01-13 – 2019-01-15 (×3): 250 mg via ORAL
  Filled 2019-01-13 (×3): qty 1

## 2019-01-13 MED ORDER — METOCLOPRAMIDE HCL 5 MG/ML IJ SOLN: 5.0000 mg | Freq: Three times a day (TID) | INTRAMUSCULAR | Status: DC | PRN

## 2019-01-13 MED ORDER — DIPHENHYDRAMINE HCL 12.5 MG/5ML PO ELIX: 12.5000 mg | ORAL_SOLUTION | ORAL | Status: DC | PRN

## 2019-01-13 MED ORDER — AMLODIPINE BESYLATE 5 MG PO TABS
2.5000 mg | ORAL_TABLET | Freq: Every day | ORAL | Status: DC
  Administered 2019-01-13 – 2019-01-15 (×3): 2.5 mg via ORAL
  Filled 2019-01-13 (×3): qty 1

## 2019-01-13 MED ORDER — WARFARIN - PHARMACIST DOSING INPATIENT: Freq: Every day | Status: DC

## 2019-01-13 MED ORDER — CEFAZOLIN SODIUM-DEXTROSE 2-4 GM/100ML-% IV SOLN
2.0000 g | Freq: Four times a day (QID) | INTRAVENOUS | Status: AC
  Administered 2019-01-13 – 2019-01-14 (×3): 2 g via INTRAVENOUS
  Filled 2019-01-13 (×3): qty 100

## 2019-01-13 MED ORDER — ONDANSETRON HCL 4 MG/2ML IJ SOLN: 4.0000 mg | Freq: Four times a day (QID) | INTRAMUSCULAR | Status: DC | PRN

## 2019-01-13 MED ORDER — MENTHOL 3 MG MT LOZG
1.0000 | LOZENGE | OROMUCOSAL | Status: DC | PRN
  Filled 2019-01-13: qty 9

## 2019-01-13 MED ORDER — MESALAMINE ER 250 MG PO CPCR
1500.0000 mg | ORAL_CAPSULE | Freq: Every day | ORAL | Status: DC
  Administered 2019-01-14 – 2019-01-15 (×2): 1500 mg via ORAL
  Filled 2019-01-13 (×2): qty 6

## 2019-01-13 MED ORDER — MAGNESIUM SULFATE 2 GM/50ML IV SOLN
2.0000 g | Freq: Once | INTRAVENOUS | Status: AC
  Administered 2019-01-13: 2 g via INTRAVENOUS
  Filled 2019-01-13: qty 50

## 2019-01-13 MED ORDER — ENOXAPARIN SODIUM 40 MG/0.4ML ~~LOC~~ SOLN
40.0000 mg | SUBCUTANEOUS | Status: DC
  Administered 2019-01-14 – 2019-01-15 (×2): 40 mg via SUBCUTANEOUS
  Filled 2019-01-13 (×2): qty 0.4

## 2019-01-13 MED ORDER — DOCUSATE SODIUM 100 MG PO CAPS
100.0000 mg | ORAL_CAPSULE | Freq: Two times a day (BID) | ORAL | Status: DC
  Administered 2019-01-13 – 2019-01-15 (×4): 100 mg via ORAL
  Filled 2019-01-13 (×4): qty 1

## 2019-01-13 MED ORDER — METOCLOPRAMIDE HCL 10 MG PO TABS: 5.0000 mg | ORAL_TABLET | Freq: Three times a day (TID) | ORAL | Status: DC | PRN

## 2019-01-13 MED ORDER — WARFARIN SODIUM 5 MG PO TABS: 5.0000 mg | ORAL_TABLET | Freq: Every day | ORAL | Status: DC

## 2019-01-13 MED ORDER — LOPERAMIDE HCL 2 MG PO CAPS
2.0000 mg | ORAL_CAPSULE | Freq: Every day | ORAL | Status: DC
  Administered 2019-01-13 – 2019-01-14 (×2): 2 mg via ORAL
  Filled 2019-01-13 (×3): qty 1

## 2019-01-13 MED ORDER — WARFARIN SODIUM 1 MG PO TABS: 1.0000 mg | ORAL_TABLET | Freq: Every day | ORAL | Status: DC

## 2019-01-13 MED ORDER — AMITRIPTYLINE HCL 10 MG PO TABS
10.0000 mg | ORAL_TABLET | Freq: Every evening | ORAL | Status: DC | PRN
  Filled 2019-01-13: qty 1

## 2019-01-13 MED ORDER — ENOXAPARIN SODIUM 40 MG/0.4ML ~~LOC~~ SOLN: 40.0000 mg | SUBCUTANEOUS | Status: DC

## 2019-01-13 MED ORDER — ATENOLOL 25 MG PO TABS
12.5000 mg | ORAL_TABLET | Freq: Every day | ORAL | Status: DC
  Administered 2019-01-14 – 2019-01-15 (×2): 12.5 mg via ORAL
  Filled 2019-01-13 (×2): qty 1

## 2019-01-13 MED ORDER — SODIUM CHLORIDE 0.9 % IV SOLN
INTRAVENOUS | Status: DC
  Administered 2019-01-13 – 2019-01-14 (×4): via INTRAVENOUS

## 2019-01-13 MED ORDER — GLIPIZIDE ER 2.5 MG PO TB24
2.5000 mg | ORAL_TABLET | Freq: Every day | ORAL | Status: DC
  Administered 2019-01-13 – 2019-01-15 (×3): 2.5 mg via ORAL
  Filled 2019-01-13 (×3): qty 1

## 2019-01-13 MED ORDER — SENNOSIDES-DOCUSATE SODIUM 8.6-50 MG PO TABS: 1.0000 | ORAL_TABLET | Freq: Every evening | ORAL | Status: DC | PRN

## 2019-01-13 MED ORDER — WARFARIN SODIUM 6 MG PO TABS
6.0000 mg | ORAL_TABLET | Freq: Once | ORAL | Status: AC
  Administered 2019-01-13: 6 mg via ORAL
  Filled 2019-01-13: qty 1

## 2019-01-13 MED ORDER — BISACODYL 10 MG RE SUPP
10.0000 mg | Freq: Every day | RECTAL | Status: DC | PRN
  Administered 2019-01-15: 10 mg via RECTAL
  Filled 2019-01-13: qty 1

## 2019-01-13 MED ORDER — LISINOPRIL 20 MG PO TABS
40.0000 mg | ORAL_TABLET | Freq: Every day | ORAL | Status: DC
  Administered 2019-01-13 – 2019-01-15 (×3): 40 mg via ORAL
  Filled 2019-01-13 (×3): qty 2

## 2019-01-13 NOTE — Progress Notes (Signed)
OT Cancellation Note  Patient Details Name: Sierra [name] MRN: [id number] DOB: [birth year]   Cancelled Treatment:    Reason Eval/Treat Not Completed: Other (comment). Consult received, chart reviewed. Upon attempt, pt eating lunch, unavailable for OT evaluation. Will re-attempt at later date/time as pt is available, medically appropriate, and as schedule permits.  Jeni Salles, MPH, MS, OTR/L ascom [phone] 01/13/19, 12:35 PM

## 2019-01-13 NOTE — Evaluation (Signed)
Physical Therapy Evaluation Patient Details Name: Sierra [name] MRN: [id number] DOB: 17-Apr-1936 Today's Date: 01/13/2019   History of Present Illness  Patient is an 83 year old female admitted after fall, sustained L hip fracture. S/P hemiarthroplasty. PMH to include HTN, Anxiety, DM.  Clinical Impression  Patient received in bed, confused, disoriented, husband present. Patient requires mod assist to perform supine to sit with min assist to maintain sitting balance and max cues. Patient able to stand with +2 assist and 2 attempts with max cues and RW. Able to ambulate bed to chair taking 4-5 steps with RW, WBAT. Patient unable to adhere to or understand hip precautions at this time, therefore hip abduction pillow placed between legs in the recliner. Patient will benefit from continued skilled PT while here to improve functional independence and safety.        Follow Up Recommendations SNF    Equipment Recommendations  None recommended by PT    Recommendations for Other Services OT consult     Precautions / Restrictions Precautions Precautions: Fall;Posterior Hip Precaution Booklet Issued: No Restrictions Weight Bearing Restrictions: No Other Position/Activity Restrictions: WBAT      Mobility  Bed Mobility Overal bed mobility: Needs Assistance Bed Mobility: Supine to Sit     Supine to sit: Mod assist;+2 for safety/equipment     General bed mobility comments: requires max cues and min/mod assist to maintain sitting balance.    Transfers Overall transfer level: Needs assistance Equipment used: Rolling walker (2 wheeled) Transfers: Sit to/from Stand           General transfer comment: first attempt at standing patient's L LE just slid out. required assistance to get back onto the bed and re-attempt standing with L LE positioned and braced.   Ambulation/Gait Ambulation/Gait assistance: Min assist;+2 physical assistance Gait Distance (Feet): 3 Feet Assistive device:  Rolling walker (2 wheeled) Gait Pattern/deviations: Step-to pattern;Decreased step length - right;Decreased step length - left;Shuffle Gait velocity: decreased      Stairs            Wheelchair Mobility    Modified Rankin (Stroke Patients Only)       Balance Overall balance assessment: Needs assistance Sitting-balance support: Bilateral upper extremity supported;Feet supported Sitting balance-Leahy Scale: Poor   Postural control: Posterior lean;Left lateral lean Standing balance support: Bilateral upper extremity supported Standing balance-Leahy Scale: Fair                               Pertinent Vitals/Pain Pain Assessment: Faces Faces Pain Scale: Hurts little more Pain Descriptors / Indicators: Aching;Discomfort;Sore;Operative site guarding Pain Intervention(s): Limited activity within patient's tolerance;Monitored during session;Repositioned;Premedicated before session    Home Living Family/patient expects to be discharged to:: Private residence Living Arrangements: Spouse/significant other   Type of Home: House Home Access: Level entry     Home Layout: One level Home Equipment: Environmental consultant - 4 wheels Additional Comments: patient was driving and receiving HHPT prior to fall    Prior Function Level of Independence: Independent with assistive device(s)               Hand Dominance        Extremity/Trunk Assessment   Upper Extremity Assessment Upper Extremity Assessment: Generalized weakness    Lower Extremity Assessment Lower Extremity Assessment: Generalized weakness       Communication   Communication: No difficulties  Cognition Arousal/Alertness: Awake/alert Behavior During Therapy: WFL for tasks assessed/performed Overall Cognitive Status:  Impaired/Different from baseline Area of Impairment: Orientation;Safety/judgement;Attention;Awareness;Memory;Following commands                 Orientation Level: Disoriented  to;Place;Time;Situation Current Attention Level: Alternating Memory: Decreased short-term memory Following Commands: Follows one step commands with increased time Safety/Judgement: Decreased awareness of safety;Decreased awareness of deficits Awareness: Intellectual          General Comments      Exercises     Assessment/Plan    PT Assessment Patient needs continued PT services  PT Problem List Decreased strength;Decreased balance;Decreased cognition;Decreased knowledge of precautions;Pain;Decreased mobility;Decreased knowledge of use of DME;Obesity;Decreased activity tolerance;Decreased coordination;Decreased safety awareness       PT Treatment Interventions DME instruction;Functional mobility training;Balance training;Patient/family education;Gait training;Therapeutic activities;Neuromuscular re-education;Therapeutic exercise;Cognitive remediation    PT Goals (Current goals can be found in the Care Plan section)  Acute Rehab PT Goals Patient Stated Goal: to go to STR PT Goal Formulation: With family Time For Goal Achievement: 01/20/19 Potential to Achieve Goals: Good    Frequency BID   Barriers to discharge Decreased caregiver support      Co-evaluation               AM-PAC PT "6 Clicks" Mobility  Outcome Measure Help needed turning from your back to your side while in a flat bed without using bedrails?: Total Help needed moving from lying on your back to sitting on the side of a flat bed without using bedrails?: A Lot Help needed moving to and from a bed to a chair (including a wheelchair)?: A Lot Help needed standing up from a chair using your arms (e.g., wheelchair or bedside chair)?: A Lot Help needed to walk in hospital room?: A Lot Help needed climbing 3-5 steps with a railing? : Total 6 Click Score: 10    End of Session Equipment Utilized During Treatment: Gait belt;Oxygen Activity Tolerance: Patient limited by pain;Patient limited by  fatigue Patient left: in chair;with chair alarm set;with family/visitor present;with call bell/phone within reach Nurse Communication: Mobility status PT Visit Diagnosis: Difficulty in walking, not elsewhere classified (R26.2);Muscle weakness (generalized) (M62.81);Unsteadiness on feet (R26.81);History of falling (Z91.81);Pain Pain - Right/Left: Left Pain - part of body: Hip    Time: 1000-1030 PT Time Calculation (min) (ACUTE ONLY): 30 min   Charges:   PT Evaluation $PT Eval Moderate Complexity: 1 Mod PT Treatments $Gait Training: 8-22 mins        Leonor Darnell, PT, GCS 01/13/19,10:59 AM

## 2019-01-13 NOTE — Progress Notes (Signed)
Patient doing relatively well post-surgery. In chair currently finishing lunch. No major concerns at this time.  LLE: 5/5 DF/PF/EHL SILT s/s/t/sp/dp distr Foot wwp Dressings c/d/i  POD#1 s/p L hip hemiarthroplasty: - PT/OT - WBAT on operative extremity. Posterior hip precautions - DVT ppx: Resume home Coumadin dose.Lovenox 40mg /dayuntil INR is therapeutic. - Pain control: Tylenol scheduled + oxycodone PO prn + dilaudid iv for breakthrough - CBC/BMP/INR daily prn

## 2019-01-13 NOTE — Clinical Social Work Placement (Signed)
   CLINICAL SOCIAL WORK PLACEMENT  NOTE  Date:  01/13/2019  Patient Details  Name: Davionne Mastrangelo MRN: [id number] Date of Birth: Dec 24, 1935  Clinical Social Work is seeking post-discharge placement for this patient at the [place] level of care (*CSW will initial, date and re-position this form in  chart as items are completed):  Yes   Patient/family provided with [facility] Work Department's list of facilities offering this level of care within the geographic area requested by the patient (or if unable, by the patient's family).  Yes   Patient/family informed of their freedom to choose among providers that offer the needed level of care, that participate in Medicare, Medicaid or managed care program needed by the patient, have an available bed and are willing to accept the patient.  Yes   Patient/family informed of [facility]'s ownership interest in [facility] and [facility], as well as of the fact that they are under no obligation to receive care at these facilities.  PASRR submitted to EDS on       PASRR number received on       Existing PASRR number confirmed on 01/13/19     FL2 transmitted to all facilities in geographic area requested by pt/family on 01/13/19     FL2 transmitted to all facilities within larger geographic area on       Patient informed that his/her managed care company has contracts with or will negotiate with certain facilities, including the following:        Yes   Patient/family informed of bed offers received.  Patient chooses bed at [facility]     Physician recommends and patient chooses bed at [facility]    Patient to be transferred to [facility] on 01/15/19.  Patient to be transferred to facility by EMS     Patient family notified on   of transfer.  Name of family member notified:        PHYSICIAN Please sign FL2     Additional Comment:    _______________________________________________ Zettie Pho, LCSW 01/13/2019, 2:20 PM

## 2019-01-13 NOTE — Progress Notes (Signed)
ANTICOAGULATION CONSULT NOTE - Initial Consult  Pharmacy Consult for Warfarin dosing Indication: atrial fibrillation  Allergies  Allergen Reactions  . Gentamicin Other (See Comments)    Kidney function  . Morphine And Related Other (See Comments)    AMS - agitation and delusions **No legal documents to be signed if taking, per family**    Patient Measurements: Height: 5' 7.5" (171.5 cm) Weight: 196 lb (88.9 kg) IBW/kg (Calculated) : 62.75  Vital Signs: Temp: 97.5 F (36.4 C) (03/07 0359) Temp Source: Oral (03/07 0359) BP: 154/79 (03/07 0647) Pulse Rate: 44 (03/07 0647)  Labs: Recent Labs    01/11/19 1421 01/12/19 0505 01/12/19 1017 01/13/19 0716  HGB 13.4 13.7  --   --   HCT 42.6 41.9  --   --   PLT 184 160  --   --   LABPROT 33.2* 20.0* 18.4*  --   INR 3.3* 1.7* 1.6*  --   CREATININE 1.44* 0.90  --  0.88  TROPONINI  --   --  <0.03  --     Estimated Creatinine Clearance: 58.9 mL/min (by C-G formula based on SCr of 0.88 mg/dL).   Medical History: Past Medical History:  Diagnosis Date  . Anginal pain ([place])   . Anxiety   . Diabetes mellitus without complication ([place])   . Hypertension   . Hypothyroidism   . Protein C deficiency ([place])   . Protein S deficiency ([place])     Medications:  Scheduled:  . amLODipine  2.5 mg Oral Daily  . atenolol  25 mg Oral Daily  . atorvastatin  40 mg Oral Daily  . budesonide  3 mg Oral Daily  . clidinium-chlordiazePOXIDE  1 capsule Oral TID AC & HS  . docusate sodium  100 mg Oral BID  . DULoxetine  60 mg Oral Daily  . enoxaparin (LOVENOX) injection  40 mg Subcutaneous Q24H  . insulin aspart  0-15 Units Subcutaneous TID WC  . insulin aspart  0-5 Units Subcutaneous QHS  . levothyroxine  88 mcg Oral QAC breakfast  . lidocaine  1 patch Transdermal Q12H  . linagliptin  5 mg Oral Daily  . lisinopril  20 mg Oral Daily  . pantoprazole  40 mg Oral Daily  . sodium chloride flush  3 mL Intravenous Q12H  . warfarin  6 mg Oral  ONCE-1800  . Warfarin - Pharmacist Dosing Inpatient   Does not apply q1800   Infusions:  . sodium chloride 250 mL (01/12/19 2301)  .  ceFAZolin (ANCEF) IV 2 g (01/13/19 7078)    Assessment: 83 yo female POD#2  to resume home dose of Warfarin at 6mg  daily.  Also ordered Lovenox 40mg  Q24H until INR is therapeutic.   3/6  INR = 1.6    Goal of Therapy:  INR 2-3 Monitor platelets by anticoagulation protocol: Yes   Plan:  Warfarin 6mg  tonight. Will monitor INR daily.  CBC will be checked at least every three (3) days while on therapy.   Olivia Canter, [facility] 01/13/2019,8:07 AM

## 2019-01-13 NOTE — NC FL2 (Signed)
[place] LEVEL OF CARE SCREENING TOOL     IDENTIFICATION  Patient Name: Sierra [name] Birthdate: 02/12/1936 Sex: female Admission Date (Current Location): 01/11/2019  [place] and [place] Number:  Engineering geologist and Address:  [facility], [address], [place], [place] [address]      Provider Number: [id number]  Attending Physician Name and Address:  Hillary Bow, MD  Relative Name and Phone Number:  Shakyia Bosso (Spouse) [phone] or Thomasene Lot (Daughter) [phone]    Current Level of Care: Hospital Recommended Level of Care: [place] Prior Approval Number:    Date Approved/Denied:   PASRR Number: [id number]  Discharge Plan: SNF    Current Diagnoses: Patient Active Problem List   Diagnosis Date Noted  . Hip fracture ([place]) 01/11/2019    Orientation RESPIRATION BLADDER Height & Weight     Self, Time, Situation, Place  Normal Continent Weight: 196 lb (88.9 kg) Height:  5' 7.5" (171.5 cm)  BEHAVIORAL SYMPTOMS/MOOD NEUROLOGICAL BOWEL NUTRITION STATUS      Continent Diet(Carb modified)  AMBULATORY STATUS COMMUNICATION OF NEEDS Skin   Extensive Assist Verbally Surgical wounds                       Personal Care Assistance Level of Assistance  Bathing, Feeding, Dressing Bathing Assistance: Limited assistance Feeding assistance: Independent Dressing Assistance: Limited assistance     Functional Limitations Info  Sight, Hearing, Speech Sight Info: Adequate Hearing Info: Adequate Speech Info: Adequate    SPECIAL CARE FACTORS FREQUENCY  PT (By licensed PT), OT (By licensed OT)     PT Frequency: 5X OT Frequency: 3X            Contractures Contractures Info: Not present    Additional Factors Info  Code Status, Allergies, Psychotropic, Insulin Sliding Scale Code Status Info: Full Allergies Info: Gentamicin, Morphine And Related Psychotropic Info: Cymbalta Insulin Sliding  Scale Info: Novolog: 0-9 units TID with meals and 0-5 units qhs       Current Medications (01/13/2019):  This is the current hospital active medication list Current Facility-Administered Medications  Medication Dose Route Frequency Provider Last Rate Last Dose  . 0.9 %  sodium chloride infusion   Intravenous Continuous Leim Fabry, MD 75 mL/hr at 01/13/19 [phone]    . albuterol (PROVENTIL) (2.5 MG/3ML) 0.083% nebulizer solution 2.5 mg  2.5 mg Nebulization Q6H PRN Leim Fabry, MD      . amitriptyline (ELAVIL) tablet 10 mg  10 mg Oral QHS PRN Leim Fabry, MD      . amLODipine (NORVASC) tablet 2.5 mg  2.5 mg Oral Daily Leim Fabry, MD   2.5 mg at 01/13/19 0902  . [START ON 01/14/2019] atenolol (TENORMIN) tablet 12.5 mg  12.5 mg Oral Daily Teodoro Spray, MD      . atorvastatin (LIPITOR) tablet 40 mg  40 mg Oral Daily Leim Fabry, MD   40 mg at 01/12/19 2317  . bisacodyl (DULCOLAX) suppository 10 mg  10 mg Rectal Daily PRN Leim Fabry, MD      . budesonide (ENTOCORT EC) 24 hr capsule 3 mg  3 mg Oral Daily Leim Fabry, MD   3 mg at 01/13/19 0902  . ceFAZolin (ANCEF) IVPB 2g/100 mL premix  2 g Intravenous Q6H Leim Fabry, MD      . clidinium-chlordiazePOXIDE (LIBRAX) 2.5-5 mg per capsule  1 capsule Oral TID AC & HS Leim Fabry, MD   1 capsule at 01/13/19 1147  .  diphenhydrAMINE (BENADRYL) 12.5 MG/5ML elixir 12.5-25 mg  12.5-25 mg Oral Q4H PRN Leim Fabry, MD      . diphenoxylate-atropine (LOMOTIL) 2.5-0.025 MG per tablet 1 tablet  1 tablet Oral QID PRN Leim Fabry, MD      . docusate sodium (COLACE) capsule 100 mg  100 mg Oral BID Leim Fabry, MD   100 mg at 01/13/19 0902  . DULoxetine (CYMBALTA) DR capsule 60 mg  60 mg Oral Daily Leim Fabry, MD   60 mg at 01/11/19 2217  . [START ON 01/14/2019] enoxaparin (LOVENOX) injection 40 mg  40 mg Subcutaneous Q24H Leim Fabry, MD      . glipiZIDE (GLUCOTROL XL) 24 hr tablet 2.5 mg  2.5 mg Oral Q breakfast Leim Fabry, MD   2.5 mg at 01/13/19 0902  .  hydrALAZINE (APRESOLINE) injection 10 mg  10 mg Intravenous Q4H PRN Leim Fabry, MD      . insulin aspart (novoLOG) injection 0-15 Units  0-15 Units Subcutaneous TID WC Leim Fabry, MD   3 Units at 01/13/19 1147  . insulin aspart (novoLOG) injection 0-5 Units  0-5 Units Subcutaneous QHS Leim Fabry, MD      . levothyroxine (SYNTHROID, LEVOTHROID) tablet 88 mcg  88 mcg Oral QAC breakfast Leim Fabry, MD   88 mcg at 01/13/19 0630  . lidocaine (LIDODERM) 5 % 1 patch  1 patch Transdermal Q12H Leim Fabry, MD   1 patch at 01/13/19 1002  . linagliptin (TRADJENTA) tablet 5 mg  5 mg Oral Daily Leim Fabry, MD   5 mg at 01/13/19 0902  . lisinopril (PRINIVIL,ZESTRIL) tablet 40 mg  40 mg Oral Daily Leim Fabry, MD   40 mg at 01/13/19 0902  . loperamide (IMODIUM) capsule 2 mg  2 mg Oral Daily Leim Fabry, MD   2 mg at 01/13/19 0902  . magnesium oxide (MAG-OX) tablet 400 mg  400 mg Oral Daily Teodoro Spray, MD   400 mg at 01/13/19 1002  . menthol-cetylpyridinium (CEPACOL) lozenge 3 mg  1 lozenge Oral PRN Leim Fabry, MD       Or  . phenol (CHLORASEPTIC) mouth spray 1 spray  1 spray Mouth/Throat PRN Leim Fabry, MD      . mesalamine (APRISO) 24 hr capsule 1.5 g  1.5 g Oral Daily Leim Fabry, MD      . metoCLOPramide (REGLAN) tablet 5-10 mg  5-10 mg Oral Q8H PRN Leim Fabry, MD       Or  . metoCLOPramide (REGLAN) injection 5-10 mg  5-10 mg Intravenous Q8H PRN Leim Fabry, MD      . ondansetron [facility]) tablet 4 mg  4 mg Oral Q6H PRN Leim Fabry, MD       Or  . ondansetron [facility]) injection 4 mg  4 mg Intravenous Q6H PRN Leim Fabry, MD      . pantoprazole (PROTONIX) EC tablet 40 mg  40 mg Oral Daily Leim Fabry, MD   40 mg at 01/13/19 0902  . [facility] CAPS 1 capsule  1 capsule Oral Daily Leim Fabry, MD      . saccharomyces boulardii [facility]) capsule 250 mg  250 mg Oral Daily Leim Fabry, MD   250 mg at 01/13/19 0901  . senna-docusate (Senokot-S) tablet 1 tablet  1 tablet  Oral QHS PRN Leim Fabry, MD      . warfarin (COUMADIN) tablet 6 mg  6 mg Oral ONCE-1800 Hillary Bow, MD      . Warfarin - Pharmacist Dosing  Inpatient   Does not apply q1800 Hillary Bow, MD         Discharge Medications: Please see discharge summary for a list of discharge medications.  Relevant Imaging Results:  Relevant Lab Results:   Additional Information SS#[id number]  Zettie Pho, LCSW

## 2019-01-13 NOTE — Progress Notes (Signed)
Patient Name: Sierra [name] Date of Encounter: 01/13/2019  Hospital Problem List     Active Problems:   Hip fracture [facility])    Patient Profile     Patient with history of protein S and C deficiency, diabetes hyperlipidemia treated with chronic warfarin therapy admitted with left hip fracture.  Is status post repair of this.  Was noted to be in atrial fibrillation flutter on admission.  Post procedure was noted to have episodic wide-complex nonsustained tachycardia.  She was given IV magnesium.  Currently is in sinus bradycardia to sinus rhythm.  Heart rhythm currently sinus at 70 bpm.  Patient is asymptomatic from a cardiac standpoint.  Electrolytes showed a potassium of 3.9.  Subjective   Some confusion post procedure with some hip pain.  Hemodynamically stable but relatively bradycardic.  Inpatient Medications    . amLODipine  2.5 mg Oral Daily  . atenolol  25 mg Oral Daily  . atorvastatin  40 mg Oral Daily  . budesonide  3 mg Oral Daily  . clidinium-chlordiazePOXIDE  1 capsule Oral TID AC & HS  . docusate sodium  100 mg Oral BID  . DULoxetine  60 mg Oral Daily  . [START ON 01/14/2019] enoxaparin (LOVENOX) injection  40 mg Subcutaneous Q24H  . glipiZIDE  2.5 mg Oral Q breakfast  . insulin aspart  0-15 Units Subcutaneous TID WC  . insulin aspart  0-5 Units Subcutaneous QHS  . levothyroxine  88 mcg Oral QAC breakfast  . lidocaine  1 patch Transdermal Q12H  . linagliptin  5 mg Oral Daily  . lisinopril  40 mg Oral Daily  . loperamide  2 mg Oral Daily  . magnesium oxide  400 mg Oral Daily  . mesalamine  1.5 g Oral Daily  . pantoprazole  40 mg Oral Daily  . [facility]  1 capsule Oral Daily  . saccharomyces boulardii  250 mg Oral Daily  . warfarin  6 mg Oral ONCE-1800  . Warfarin - Pharmacist Dosing Inpatient   Does not apply q1800    Vital Signs    Vitals:   01/12/19 2315 01/13/19 0359 01/13/19 0647 01/13/19 0805  BP: (!) 186/84 (!) 164/95 (!) 154/79 140/71   Pulse: 65 (!) 37 (!) 44 (!) 43  Resp: 16 14  19   Temp: 98.4 F (36.9 C) (!) 97.5 F (36.4 C)  98.1 F (36.7 C)  TempSrc: Oral Oral  Oral  SpO2: 97% 97% 94% 95%  Weight:      Height:        Intake/Output Summary (Last 24 hours) at 01/13/2019 0951 Last data filed at 01/13/2019 0830 Gross per 24 hour  Intake 750 ml  Output 1750 ml  Net -1000 ml   Filed Weights   01/11/19 1415  Weight: 88.9 kg    Physical Exam    GEN: Well nourished, well developed, in no acute distress.  HEENT: normal.  Neck: Supple, no JVD, carotid bruits, or masses. Cardiac: RRR, no murmurs, rubs, or gallops. No clubbing, cyanosis, edema.  Radials/DP/PT 2+ and equal bilaterally.  Respiratory:  Respirations regular and unlabored, clear to auscultation bilaterally. GI: Soft, nontender, nondistended, BS + x 4. MS: no deformity or atrophy. Skin: warm and dry, no rash. Neuro:  Strength and sensation are intact. Psych: Normal affect.  Labs    CBC Recent Labs    01/11/19 1421 01/12/19 0505  WBC 6.9 8.0  NEUTROABS 4.9  --   HGB 13.4 13.7  HCT 42.6 41.9  MCV 92.6 89.0  PLT 184 161   Basic Metabolic Panel Recent Labs    01/12/19 0505 01/13/19 0716  NA 137 135  K 3.7 3.9  CL 106 105  CO2 23 20*  GLUCOSE 167* 197*  BUN 18 13  CREATININE 0.90 0.88  CALCIUM 8.7* 7.8*  MG  --  1.5*   Liver Function Tests Recent Labs    01/11/19 1421  AST 21  ALT 15  ALKPHOS 47  BILITOT 1.0  PROT 7.1  ALBUMIN 4.0   No results for input(s): LIPASE, AMYLASE in the last 72 hours. Cardiac Enzymes Recent Labs    01/12/19 1017  TROPONINI <0.03   BNP No results for input(s): BNP in the last 72 hours. D-Dimer No results for input(s): DDIMER in the last 72 hours. Hemoglobin A1C No results for input(s): HGBA1C in the last 72 hours. Fasting Lipid Panel No results for input(s): CHOL, HDL, LDLCALC, TRIG, CHOLHDL, LDLDIRECT in the last 72 hours. Thyroid Function Tests Recent Labs    01/11/19 1421  TSH  4.536*    Telemetry    Sinus bradycardia though had nonsustained episodes of wide-complex tachycardia last p.m.  ECG    Initially atrial fibrillation  Radiology    Dg Chest 1 View  Result Date: 01/11/2019 CLINICAL DATA:  Status post fall. Left hip pain. History of hypertension. Former smoker. EXAM: CHEST  1 VIEW COMPARISON:  02/25/2015 FINDINGS: Cardiac silhouette is normal in size. No mediastinal or hilar masses or evidence of adenopathy. Clear lungs.  No pleural effusion or pneumothorax. Skeletal structures are grossly intact. IMPRESSION: No active disease. Electronically Signed   By: Lajean Manes M.D.   On: 01/11/2019 15:33   Dg Pelvis 1-2 Views  Result Date: 01/12/2019 CLINICAL DATA:  Left hip prosthesis placement. EXAM: PELVIS - 1-2 VIEW COMPARISON:  Yesterday. FINDINGS: Interval placement of the femoral component of a left hip prosthesis. This appears to be in satisfactory position and alignment. The femoral head and neck have been resected. No acute fracture seen. IMPRESSION: Satisfactory appearance of the femoral component of a left hip prosthesis. Electronically Signed   By: Claudie Revering M.D.   On: 01/12/2019 18:12   Ct Head Wo Contrast  Result Date: 01/11/2019 CLINICAL DATA:  Altered level of consciousness. female with a history of anginal pain, anxiety, diabetes, hypertension, protein C&S deficiency who presents to the ED for left hip pain after fall. Patient was in the parking lot after shopping and fell injuring her left hip. She cannot bear weight since the fall. She denies any other injuries or complaints EXAM: CT HEAD WITHOUT CONTRAST TECHNIQUE: Contiguous axial images were obtained from the base of the skull through the vertex without intravenous contrast. COMPARISON:  None. FINDINGS: Brain: No evidence of acute infarction, hemorrhage, hydrocephalus, extra-axial collection or mass lesion/mass effect. There is age appropriate ventricular and sulcal enlargement. Patchy areas of  white matter hypoattenuation noted consistent with mild to moderate chronic microvascular ischemic change. Vascular: No hyperdense vessel or unexpected calcification. Skull: Normal. Negative for fracture or focal lesion. Sinuses/Orbits: Globes and orbits are unremarkable. The visualized sinuses and mastoid air cells are clear. Other: None. IMPRESSION: 1. No acute intracranial abnormalities. 2. Age-appropriate volume loss. Mild to moderate chronic microvascular ischemic change. Electronically Signed   By: Lajean Manes M.D.   On: 01/11/2019 15:32   Dg Pelvis Portable  Result Date: 01/12/2019 CLINICAL DATA:  Left hip prosthesis placement. EXAM: PORTABLE PELVIS 1-2 VIEWS COMPARISON:  Earlier today. FINDINGS: Left bipolar  hip prosthesis in satisfactory position and alignment. The femoral component does not have the serrated edges seen on the images earlier today. No fracture or dislocation seen. IMPRESSION: Satisfactory postoperative appearance of a left bipolar hip prosthesis. Electronically Signed   By: Claudie Revering M.D.   On: 01/12/2019 18:12   US Venous Img Lower Unilateral Left  Result Date: 12/14/2018 CLINICAL DATA:  Left leg swelling EXAM: LEFT LOWER EXTREMITY VENOUS DUPLEX ULTRASOUND TECHNIQUE: Doppler venous assessment of the left lower extremity deep venous system was performed, including characterization of spectral flow, compressibility, and phasicity. COMPARISON:  None. FINDINGS: There is complete compressibility of the left common femoral, femoral, and popliteal veins. Doppler analysis demonstrates respiratory phasicity and augmentation of flow with calf compression. No obvious superficial vein or calf vein thrombosis. IMPRESSION: No evidence of left lower extremity DVT. Electronically Signed   By: Marybelle Killings M.D.   On: 12/14/2018 15:59   Dg Hip Unilat W Or W/o Pelvis 2-3 Views Left  Result Date: 01/11/2019 CLINICAL DATA:  Initial evaluation for acute trauma, fall. EXAM: DG HIP (WITH OR WITHOUT  PELVIS) 2-3V LEFT COMPARISON:  None. FINDINGS: There is an acute fracture extending through the left femoral neck with mild superior subluxation. Femoral head remains normally position within the acetabulum. Femoral head height maintained. Bony pelvis intact. Limited views of the right hip demonstrate no acute osseous abnormality. Visible soft tissues within normal limits. IMPRESSION: Acute mildly displaced fracture of the left femoral neck. Electronically Signed   By: Jeannine Boga M.D.   On: 01/11/2019 15:21    Assessment & Plan    Patient with history of protein S and C deficiency diabetes and hyperlipidemia admitted with hip fracture.  She is status post surgical repair.  Atrial fibrillation.  Had transient A. fib on telemetry during admission.  Was in sinus rhythm at the start of her surgery.  Last p.m. had episodic atrial fibrillation with episodic wide-complex tachycardia nonsustained.  Was given IV magnesium.  Currently sinus rhythm to sinus bradycardia.  Would agree with resuming Coumadin.  Patient received vitamin K on admission so may take some time to attain therapeutic range.  Does not appear to need bridging at present.  Nonsustained wide-complex tachycardia-VT versus aberrant A. fib.  Likely ventricular in origin.  Patient is currently stable.  Would start on po magnesium and resume atenolol at 12.5 mg daily. Will follow.   Signed, Javier Docker Lilly Gasser MD 01/13/2019, 9:51 AM  Pager: [phone]

## 2019-01-13 NOTE — Significant Event (Signed)
Rapid Response Event Note  Overview: Time Called: 0964 Arrival Time: 3838 Event Type: Cardiac  Initial Focused Assessment: called RR on pt for runs of VT? Patient laying in bed, bradycardia on monitor, c/o pain secondary to hip surgery the previous day. No distress.   Interventions: EKG and stat morning labs to check electrolytes.  Plan of Care (if not transferred): Labs to check electrolytes... call for further assistance.  Event Summary: Name of Physician Notified: Marcille Blanco at [phone]    at    Outcome: Stayed in room and stabalized  Event End Time: [place]

## 2019-01-13 NOTE — Clinical Social Work Note (Signed)
Clinical Social Work Assessment  Patient Details  Name: Sierra [name] MRN: [id number] Date of Birth: 06/13/1936  Date of referral:  01/13/19               Reason for consult:  Facility Placement                Permission sought to share information with:  Chartered certified accountant granted to share information::  Yes, Verbal Permission Granted  Name::        Agency::  [facility]  Relationship::     Contact Information:     Housing/Transportation Living arrangements for the past 2 months:  [place] of Information:  Patient, Scientist, water quality, Facility Patient Interpreter Needed:  None Criminal Activity/Legal Involvement Pertinent to Current Situation/Hospitalization:  No - Comment as needed Significant Relationships:  Adult Children, Church, [employer], Spouse Lives with:  Spouse Do you feel safe going back to the place where you live?  Yes Need for family participation in patient care:  No (Coment)  Care giving concerns: PT recommendation for SNF; resident of a tiered community; hip fracture   Social Worker assessment / plan:  The CSW met with the patient at bedside to introduce self and role in care. The CSW explained the PT and OT recommendations for SNF at discharge as well as how Medicare covers SNF admission. The patient shared that she has been an independent living resident at [facility] for [facility] years and would prefer to use the SNF that is part of her tiered services. The CSW provided a list of SNFs in [facility] from [place].gov so that the patient is aware of alternative options and ratings should she change her mind.  The CSW has updated [place] Admissions Coordinator, of the possible discharge on Monday and has sent the referral information via the [facility]. The CSW will continue to follow for discharge facilitation.  Employment status:  Retired Forensic scientist:  [employer] PT Recommendations:  [place] / Referral to community resources:  [place]  Patient/Family's Response to care:  The patient thanked the CSW.  Patient/Family's Understanding of and Emotional Response to Diagnosis, Current Treatment, and Prognosis:  After discussion and teach back, the patient demonstrated understanding of the discharge plan and is in agreement.  Emotional Assessment Appearance:  Appears stated age Attitude/Demeanor/Rapport:  Charismatic, Gracious, Engaged Affect (typically observed):  Appropriate, Pleasant, Stable Orientation:  Oriented to Self, Oriented to Place, Oriented to  Time, Oriented to Situation Alcohol / Substance use:  Never Used Psych involvement (Current and /or in the community):  No (Comment)  Discharge Needs  Concerns to be addressed:  Discharge Planning Concerns, Care Coordination Readmission within the last 30 days:  No Current discharge risk:  Physical Impairment Barriers to Discharge:  Continued Medical Work up   [employer], LCSW 01/13/2019, 2:09 PM

## 2019-01-13 NOTE — Progress Notes (Signed)
[place] [facility] [place] NAME: Sierra [name]    MR#:  [id number]  DATE OF BIRTH:  10-23-1936  SUBJECTIVE:  CHIEF COMPLAINT:   Chief Complaint  Patient presents with  . Fall   Postop day 1 after left hip hemiarthroplasty. Worked with physical therapy.  Sitting in a chair.  REVIEW OF SYSTEMS:  CONSTITUTIONAL: Fatigue. EYES: No blurred or double vision.  EARS, NOSE, AND THROAT: No tinnitus or ear pain.  RESPIRATORY: No cough, shortness of breath, wheezing or hemoptysis.  CARDIOVASCULAR: No chest pain, orthopnea, edema.  GASTROINTESTINAL: No nausea, vomiting, diarrhea or abdominal pain.  GENITOURINARY: No dysuria, hematuria.  ENDOCRINE: No polyuria, nocturia.  HEMATOLOGY: No anemia, easy bruising or bleeding. SKIN: No rash or lesion. MUSCULOSKELETAL: Left hip joint pain. NEUROLOGIC: No tingling, numbness, weakness.  PSYCHIATRY: No anxiety or depression.   ROS  DRUG ALLERGIES:   Allergies  Allergen Reactions  . Gentamicin Other (See Comments)    Kidney function  . Morphine And Related Other (See Comments)    AMS - agitation and delusions **No legal documents to be signed if taking, per family**    VITALS:  Blood pressure 140/71, pulse (!) 43, temperature 98.1 F (36.7 C), temperature source Oral, resp. rate 19, height 5' 7.5" (1.715 m), weight 88.9 kg, SpO2 95 %.  PHYSICAL EXAMINATION:  GENERAL:  83 y.o.-year-old patient lying in the bed with no acute distress.  EYES: Pupils equal, round, reactive to light and accommodation. No scleral icterus. Extraocular muscles intact.  HEENT: Head atraumatic, normocephalic. Oropharynx and nasopharynx clear.  NECK:  Supple, no jugular venous distention. No thyroid enlargement, no tenderness.  LUNGS: Normal breath sounds bilaterally, no wheezing, rales,rhonchi or crepitation. No use of accessory muscles of respiration.  CARDIOVASCULAR: S1, S2 normal. No murmurs, rubs, or gallops.  ABDOMEN: Soft,  nontender, nondistended. Bowel sounds present. No organomegaly or mass.  EXTREMITIES: No pedal edema, cyanosis, or clubbing.  NEUROLOGIC: Cranial nerves II through XII are intact. Muscle strength 4/5 in all extremities except for left lower extremity which is limited due to pain secondary to fracture.. Sensation intact. Gait not checked.  PSYCHIATRIC: The patient is alert and oriented x 3.  SKIN: No obvious rash, lesion, or ulcer.   Physical Exam LABORATORY PANEL:   CBC Recent Labs  Lab 01/12/19 0505  WBC 8.0  HGB 13.7  HCT 41.9  PLT 160   ------------------------------------------------------------------------------------------------------------------  Chemistries  Recent Labs  Lab 01/11/19 1421  01/13/19 0716  NA 139   < > 135  K 4.1   < > 3.9  CL 107   < > 105  CO2 23   < > 20*  GLUCOSE 103*   < > 197*  BUN 28*   < > 13  CREATININE 1.44*   < > 0.88  CALCIUM 9.0   < > 7.8*  MG  --   --  1.5*  AST 21  --   --   ALT 15  --   --   ALKPHOS 47  --   --   BILITOT 1.0  --   --    < > = values in this interval not displayed.   ------------------------------------------------------------------------------------------------------------------  Cardiac Enzymes Recent Labs  Lab 01/12/19 1017  TROPONINI <0.03   ------------------------------------------------------------------------------------------------------------------  RADIOLOGY:  Dg Chest 1 View  Result Date: 01/11/2019 CLINICAL DATA:  Status post fall. Left hip pain. History of hypertension. Former smoker. EXAM: CHEST  1 VIEW COMPARISON:  02/25/2015 FINDINGS:  Cardiac silhouette is normal in size. No mediastinal or hilar masses or evidence of adenopathy. Clear lungs.  No pleural effusion or pneumothorax. Skeletal structures are grossly intact. IMPRESSION: No active disease. Electronically Signed   By: Lajean Manes M.D.   On: 01/11/2019 15:33   Dg Pelvis 1-2 Views  Result Date: 01/12/2019 CLINICAL DATA:  Left hip  prosthesis placement. EXAM: PELVIS - 1-2 VIEW COMPARISON:  Yesterday. FINDINGS: Interval placement of the femoral component of a left hip prosthesis. This appears to be in satisfactory position and alignment. The femoral head and neck have been resected. No acute fracture seen. IMPRESSION: Satisfactory appearance of the femoral component of a left hip prosthesis. Electronically Signed   By: Claudie Revering M.D.   On: 01/12/2019 18:12   Ct Head Wo Contrast  Result Date: 01/11/2019 CLINICAL DATA:  Altered level of consciousness. female with a history of anginal pain, anxiety, diabetes, hypertension, protein C&S deficiency who presents to the ED for left hip pain after fall. Patient was in the parking lot after shopping and fell injuring her left hip. She cannot bear weight since the fall. She denies any other injuries or complaints EXAM: CT HEAD WITHOUT CONTRAST TECHNIQUE: Contiguous axial images were obtained from the base of the skull through the vertex without intravenous contrast. COMPARISON:  None. FINDINGS: Brain: No evidence of acute infarction, hemorrhage, hydrocephalus, extra-axial collection or mass lesion/mass effect. There is age appropriate ventricular and sulcal enlargement. Patchy areas of white matter hypoattenuation noted consistent with mild to moderate chronic microvascular ischemic change. Vascular: No hyperdense vessel or unexpected calcification. Skull: Normal. Negative for fracture or focal lesion. Sinuses/Orbits: Globes and orbits are unremarkable. The visualized sinuses and mastoid air cells are clear. Other: None. IMPRESSION: 1. No acute intracranial abnormalities. 2. Age-appropriate volume loss. Mild to moderate chronic microvascular ischemic change. Electronically Signed   By: Lajean Manes M.D.   On: 01/11/2019 15:32   Dg Pelvis Portable  Result Date: 01/12/2019 CLINICAL DATA:  Left hip prosthesis placement. EXAM: PORTABLE PELVIS 1-2 VIEWS COMPARISON:  Earlier today. FINDINGS: Left  bipolar hip prosthesis in satisfactory position and alignment. The femoral component does not have the serrated edges seen on the images earlier today. No fracture or dislocation seen. IMPRESSION: Satisfactory postoperative appearance of a left bipolar hip prosthesis. Electronically Signed   By: Claudie Revering M.D.   On: 01/12/2019 18:12   Dg Hip Unilat W Or W/o Pelvis 2-3 Views Left  Result Date: 01/11/2019 CLINICAL DATA:  Initial evaluation for acute trauma, fall. EXAM: DG HIP (WITH OR WITHOUT PELVIS) 2-3V LEFT COMPARISON:  None. FINDINGS: There is an acute fracture extending through the left femoral neck with mild superior subluxation. Femoral head remains normally position within the acetabulum. Femoral head height maintained. Bony pelvis intact. Limited views of the right hip demonstrate no acute osseous abnormality. Visible soft tissues within normal limits. IMPRESSION: Acute mildly displaced fracture of the left femoral neck. Electronically Signed   By: Jeannine Boga M.D.   On: 01/11/2019 15:21    ASSESSMENT AND PLAN:   Active Problems:   Hip fracture ([name])  *Acute left femoral neck fracture status post mechanical fall Postop day 1 Pain medications as needed Physical therapy working with patient Likely discharge in 1 to 2 days to skilled nursing facility.  *Chronic protein S and C deficiency On chronic Coumadin Vitamin K given for surgery.  To restart today.  On DVT prophylaxis with Lovenox  *Chronic diabetes mellitus type 2 Sliding scale insulin with  Accu-Cheks per routine  *Paroxysmal atrial fibrillation Atenolol held today due to mild bradycardia.  Discussed with Dr. Ubaldo Glassing of cardiology. Resume Coumadin today.  *Chronic benign essential hypertension Stable Continue atenolol, lisinopril  *Chronic hyperlipidemia, unspecified Continue statin therapy  *Chronic hypothyroidism continue Synthroid   All the records are reviewed and case discussed with Care  Management/Social Workerr. Management plans discussed with the patient, family and they are in agreement.  CODE STATUS: Full  TOTAL TIME TAKING CARE OF THIS PATIENT: 35 minutes.   POSSIBLE D/C IN 1-2 DAYS, DEPENDING ON CLINICAL CONDITION.  Neita Carp M.D on 01/13/2019   Between 7am to 6pm - Pager - [phone]  After 6pm go to [email or web address] - password [email or web address] [place] [facility]  Office  [phone]  CC: Primary care physician; Whitaker, Rolanda Jay, PA-C  Note: This dictation was prepared with Dragon dictation along with smaller phrase technology. Any transcriptional errors that result from this process are unintentional.

## 2019-01-13 NOTE — Progress Notes (Signed)
Physical Therapy Treatment Patient Details Name: Sierra [name] MRN: [id number] DOB: 18-Sep-1936 Today's Date: 01/13/2019    History of Present Illness Patient is an 83 year old female admitted after fall, sustained L hip fracture. S/P hemiarthroplasty. PMH to include HTN, Anxiety, DM.    PT Comments    Patient continues with some confusion this afternoon, but seems to be improving. Patient performed transfer with min assist, but having difficulty with standing balance. Unsafe despite max verbal and tactile cues to keep both hands on walker. Patient required 3 attempts to get back to bed from recliner. First two attempts using RW, patient too unsafe and unable to perform, so assisted patient back to bed via stand pivot +2 assist. Patient limited by fatigue this pm. Patient will continue to benefit from skilled PT to address her weakness, difficulty walking, and decreased activity tolerance.      Follow Up Recommendations  SNF     Equipment Recommendations  None recommended by PT    Recommendations for Other Services OT consult     Precautions / Restrictions Precautions Precautions: Fall;Posterior Hip Precaution Booklet Issued: No Restrictions Weight Bearing Restrictions: No Other Position/Activity Restrictions: WBAT    Mobility  Bed Mobility Overal bed mobility: Needs Assistance Bed Mobility: Sit to Supine   Sidelying to sit: Max assist Supine to sit: Mod assist;+2 for safety/equipment     General bed mobility comments: max assist due to fatigue  Transfers Overall transfer level: Needs assistance Equipment used: Rolling walker (2 wheeled) Transfers: Sit to/from [facility] Sit to Stand: Mod assist;+2 physical assistance         General transfer comment: first attempt, patient stood well but unable to maintain standing erect, kept putting left hand on window sill instead of walker destpite cues. flexed posture, so returned patient to sitting in recliner.  Called for assist, patient continued to be unable to maintain standing safely with both hands on walker, unable to consistently follow direction.   Patient again returned to chair due to decreased safety and decreased ability to ambulate. Patient then assisted back to bed via 2 person pivot  from recliner to bed.   Ambulation/Gait Ambulation/Gait assistance: Min assist;+2 physical assistance Gait Distance (Feet): 1 Feet Assistive device: Rolling walker (2 wheeled) Gait Pattern/deviations: Step-to pattern;Decreased step length - left;Decreased step length - right;Shuffle;Trunk flexed Gait velocity: decreased   General Gait Details: patient unsafe to ambulate more this pm.    Stairs             Wheelchair Mobility    Modified Rankin (Stroke Patients Only)       Balance Overall balance assessment: Needs assistance Sitting-balance support: Single extremity supported;Feet supported Sitting balance-Leahy Scale: Fair   Postural control: Posterior lean;Left lateral lean Standing balance support: Bilateral upper extremity supported Standing balance-Leahy Scale: Poor Standing balance comment: unable to maintain standing balance this pm despite cues and assist. Unsafe.                              Cognition Arousal/Alertness: Awake/alert Behavior During Therapy: Impulsive;WFL for tasks assessed/performed Overall Cognitive Status: Impaired/Different from baseline Area of Impairment: Orientation;Safety/judgement;Attention;Awareness;Memory;Following commands                 Orientation Level: Disoriented to;Place;Time;Situation Current Attention Level: Alternating Memory: Decreased short-term memory;Decreased recall of precautions Following Commands: Follows one step commands inconsistently Safety/Judgement: Decreased awareness of safety;Decreased awareness of deficits Awareness: Intellectual  Exercises Total Joint Exercises Ankle Circles/Pumps:  AROM;10 reps;Both;Supine Heel Slides: AROM;5 reps;Supine;Both Hip ABduction/ADduction: AAROM;10 reps;Supine;Left    General Comments        Pertinent Vitals/Pain Pain Assessment: Faces Faces Pain Scale: Hurts little more Pain Location: R hip Pain Descriptors / Indicators: Aching;Discomfort;Operative site guarding;Sore Pain Intervention(s): Limited activity within patient's tolerance;Monitored during session;Repositioned    Home Living Family/patient expects to be discharged to:: Private residence Living Arrangements: Spouse/significant other   Type of Home: House Home Access: Level entry   Home Layout: One level Home Equipment: Environmental consultant - 4 wheels Additional Comments: patient was driving and receiving HHPT prior to fall    Prior Function Level of Independence: Independent with assistive device(s)          PT Goals (current goals can now be found in the care plan section) Acute Rehab PT Goals Patient Stated Goal: to go to STR PT Goal Formulation: With family Time For Goal Achievement: 01/20/19 Potential to Achieve Goals: Good Progress towards PT goals: Not progressing toward goals - comment(unable to progress this pm due to fatigue, continued confusion)    Frequency    BID      PT Plan Current plan remains appropriate    Co-evaluation              AM-PAC PT "6 Clicks" Mobility   Outcome Measure  Help needed turning from your back to your side while in a flat bed without using bedrails?: Total Help needed moving from lying on your back to sitting on the side of a flat bed without using bedrails?: A Lot Help needed moving to and from a bed to a chair (including a wheelchair)?: A Lot Help needed standing up from a chair using your arms (e.g., wheelchair or bedside chair)?: A Lot Help needed to walk in hospital room?: Total Help needed climbing 3-5 steps with a railing? : Total 6 Click Score: 9    End of Session Equipment Utilized During Treatment: Gait  belt;Oxygen Activity Tolerance: Patient limited by fatigue;Patient limited by pain Patient left: in bed;with bed alarm set;with call bell/phone within reach Nurse Communication: Mobility status PT Visit Diagnosis: Muscle weakness (generalized) (M62.81);Unsteadiness on feet (R26.81);Difficulty in walking, not elsewhere classified (R26.2);Pain;History of falling (Z91.81) Pain - Right/Left: Left Pain - part of body: Hip     Time: 3734-2876 PT Time Calculation (min) (ACUTE ONLY): 25 min  Charges:  $Gait Training: 8-22 mins $Therapeutic Exercise: 8-22 mins                     Bernetha Anschutz, PT, GCS 01/13/19,2:01 PM

## 2019-01-14 ENCOUNTER — Encounter: Payer: Self-pay | Admitting: Orthopedic Surgery

## 2019-01-14 LAB — BASIC METABOLIC PANEL
Anion gap: 10 (ref 5–15)
BUN: 11 mg/dL (ref 8–23)
CO2: 22 mmol/L (ref 22–32)
Calcium: 8.2 mg/dL — ABNORMAL LOW (ref 8.9–10.3)
Chloride: 102 mmol/L (ref 98–111)
Creatinine, Ser: 0.87 mg/dL (ref 0.44–1.00)
GFR calc Af Amer: >60 mL/min (ref >60)
GFR calc non Af Amer: >60 mL/min (ref >60)
Glucose, Bld: 192 mg/dL — ABNORMAL HIGH (ref 70–99)
Potassium: 3.6 mmol/L (ref 3.5–5.1)
Sodium: 134 mmol/L — ABNORMAL LOW (ref 135–145)

## 2019-01-14 LAB — CBC
HCT: 38.5 % (ref 36.0–46.0)
Hemoglobin: 12.9 g/dL (ref 12.0–15.0)
MCH: 29.9 pg (ref 26.0–34.0)
MCHC: 33.5 g/dL (ref 30.0–36.0)
MCV: 89.1 fL (ref 80.0–100.0)
Platelets: 137 10*3/uL — ABNORMAL LOW (ref 150–400)
RBC: 4.32 MIL/uL (ref 3.87–5.11)
RDW: 13.5 % (ref 11.5–15.5)
WBC: 12.2 10*3/uL — ABNORMAL HIGH (ref 4.0–10.5)
nRBC: 0 % (ref 0.0–0.2)

## 2019-01-14 LAB — GLUCOSE, CAPILLARY
Glucose-Capillary: 170 mg/dL — ABNORMAL HIGH (ref 70–99)
Glucose-Capillary: 165 mg/dL — ABNORMAL HIGH (ref 70–99)
Glucose-Capillary: 161 mg/dL — ABNORMAL HIGH (ref 70–99)
Glucose-Capillary: 131 mg/dL — ABNORMAL HIGH (ref 70–99)

## 2019-01-14 LAB — PROTIME-INR
INR: 1.3 — ABNORMAL HIGH (ref 0.8–1.2)
Prothrombin Time: 15.8 seconds — ABNORMAL HIGH (ref 11.4–15.2)

## 2019-01-14 MED ORDER — WARFARIN SODIUM 6 MG PO TABS
6.0000 mg | ORAL_TABLET | Freq: Every day | ORAL | Status: AC
  Administered 2019-01-14: 6 mg via ORAL
  Filled 2019-01-14: qty 1

## 2019-01-14 NOTE — Progress Notes (Signed)
   Subjective: 2 Days Post-Op Procedure(s) (LRB): ARTHROPLASTY BIPOLAR HIP (HEMIARTHROPLASTY), LEFT (Left) Patient reports pain as mild.   Patient is well, and has had no acute complaints or problems Denies any CP, SOB, ABD pain. We will continue therapy today.  Plan is to go Skilled nursing facility after hospital stay.  Objective: Vital signs in last 24 hours: Temp:  [98 F (36.7 C)-98.9 F (37.2 C)] 98.1 F (36.7 C) (03/08 0004) Pulse Rate:  [78-82] 82 (03/08 0004) Resp:  [19-20] 20 (03/08 0004) BP: (152-163)/(80-86) 159/80 (03/08 0004) SpO2:  [96 %-100 %] 100 % (03/08 0004)  Intake/Output from previous day: 03/07 0701 - 03/08 0700 In: 770.7 [P.O.:240; I.V.:430.7; IV Piggyback:100] Out: 1800 [Urine:1800] Intake/Output this shift: Total I/O In: 1221.2 [I.V.:1021.2; IV Piggyback:200] Out: 1100 [Urine:1100]  Recent Labs    01/11/19 1421 01/12/19 0505 01/14/19 0448  HGB 13.4 13.7 12.9   Recent Labs    01/12/19 0505 01/14/19 0448  WBC 8.0 12.2*  RBC 4.71 4.32  HCT 41.9 38.5  PLT 160 137*   Recent Labs    01/13/19 0716 01/14/19 0448  NA 135 134*  K 3.9 3.6  CL 105 102  CO2 20* 22  BUN 13 11  CREATININE 0.88 0.87  GLUCOSE 197* 192*  CALCIUM 7.8* 8.2*   Recent Labs    01/12/19 1017 01/14/19 0448  INR 1.6* 1.3*    EXAM General - Patient is Alert, Appropriate and Oriented Extremity - Neurovascular intact Sensation intact distally Intact pulses distally Dorsiflexion/Plantar flexion intact No cellulitis present Compartment soft Dressing - dressing C/D/I and scant drainage Motor Function - intact, moving foot and toes well on exam.   Past Medical History:  Diagnosis Date  . Anginal pain ([place])   . Anxiety   . Diabetes mellitus without complication ([place])   . Hypertension   . Hypothyroidism   . Protein C deficiency ([place])   . Protein S deficiency ([place])     Assessment/Plan:   2 Days Post-Op Procedure(s) (LRB): ARTHROPLASTY BIPOLAR HIP  (HEMIARTHROPLASTY), LEFT (Left) Active Problems:   Hip fracture ([name])  Estimated body mass index is 30.24 kg/m as calculated from the following:   Height as of this encounter: 5' 7.5" (1.715 m).   Weight as of this encounter: 88.9 kg. Advance diet Up with therapy, WBAT Needs BM Pain controlled Labs and vital signs stable INR 1.3, pharmacy managing Coumadin. Continue with Lovenox until INR therapeutic   CM to assist with discharge to SNF    DVT Prophylaxis - Lovenox, Coumadin and TED hose, SCDs Weight-Bearing as tolerated to left leg   T. Rachelle Hora, PA-C [place] 01/14/2019, 9:26 AM

## 2019-01-14 NOTE — Progress Notes (Signed)
[place] [facility] [place] NAME: Sierra [name]    MR#:  [id number]  DATE OF BIRTH:  06/11/1936  SUBJECTIVE:  CHIEF COMPLAINT:   Chief Complaint  Patient presents with  . Fall   Postop day 2 after left hip hemiarthroplasty. Hasn't worked with PT today yet  REVIEW OF SYSTEMS:  CONSTITUTIONAL: Fatigue. EYES: No blurred or double vision.  EARS, NOSE, AND THROAT: No tinnitus or ear pain.  RESPIRATORY: No cough, shortness of breath, wheezing or hemoptysis.  CARDIOVASCULAR: No chest pain, orthopnea, edema.  GASTROINTESTINAL: No nausea, vomiting, diarrhea or abdominal pain.  GENITOURINARY: No dysuria, hematuria.  ENDOCRINE: No polyuria, nocturia.  HEMATOLOGY: No anemia, easy bruising or bleeding. SKIN: No rash or lesion. MUSCULOSKELETAL: Left hip joint pain. NEUROLOGIC: No tingling, numbness, weakness.  PSYCHIATRY: No anxiety or depression.   ROS  DRUG ALLERGIES:   Allergies  Allergen Reactions  . Gentamicin Other (See Comments)    Kidney function  . Morphine And Related Other (See Comments)    AMS - agitation and delusions **No legal documents to be signed if taking, per family**    VITALS:  Blood pressure (!) 159/80, pulse 82, temperature 98.1 F (36.7 C), temperature source Oral, resp. rate 20, height 5' 7.5" (1.715 m), weight 88.9 kg, SpO2 100 %.  PHYSICAL EXAMINATION:  GENERAL:  83 y.o.-year-old patient lying in the bed with no acute distress.  EYES: Pupils equal, round, reactive to light and accommodation. No scleral icterus. Extraocular muscles intact.  HEENT: Head atraumatic, normocephalic. Oropharynx and nasopharynx clear.  NECK:  Supple, no jugular venous distention. No thyroid enlargement, no tenderness.  LUNGS: Normal breath sounds bilaterally, no wheezing, rales,rhonchi or crepitation. No use of accessory muscles of respiration.  CARDIOVASCULAR: S1, S2 normal. No murmurs, rubs, or gallops.  ABDOMEN: Soft, nontender,  nondistended. Bowel sounds present. No organomegaly or mass.  EXTREMITIES: No pedal edema, cyanosis, or clubbing.  NEUROLOGIC: Cranial nerves II through XII are intact. Muscle strength 4/5 in all extremities except for left lower extremity which is limited due to pain secondary to fracture.. Sensation intact. Gait not checked.  PSYCHIATRIC: The patient is alert and oriented x 3.  SKIN: No obvious rash, lesion, or ulcer.   Physical Exam LABORATORY PANEL:   CBC Recent Labs  Lab 01/14/19 0448  WBC 12.2*  HGB 12.9  HCT 38.5  PLT 137*   ------------------------------------------------------------------------------------------------------------------  Chemistries  Recent Labs  Lab 01/11/19 1421  01/13/19 0716 01/14/19 0448  NA 139   < > 135 134*  K 4.1   < > 3.9 3.6  CL 107   < > 105 102  CO2 23   < > 20* 22  GLUCOSE 103*   < > 197* 192*  BUN 28*   < > 13 11  CREATININE 1.44*   < > 0.88 0.87  CALCIUM 9.0   < > 7.8* 8.2*  MG  --   --  1.5*  --   AST 21  --   --   --   ALT 15  --   --   --   ALKPHOS 47  --   --   --   BILITOT 1.0  --   --   --    < > = values in this interval not displayed.   ------------------------------------------------------------------------------------------------------------------  Cardiac Enzymes Recent Labs  Lab 01/12/19 1017  TROPONINI <0.03   ------------------------------------------------------------------------------------------------------------------  RADIOLOGY:  Dg Pelvis 1-2 Views  Result Date: 01/12/2019 CLINICAL  DATA:  Left hip prosthesis placement. EXAM: PELVIS - 1-2 VIEW COMPARISON:  Yesterday. FINDINGS: Interval placement of the femoral component of a left hip prosthesis. This appears to be in satisfactory position and alignment. The femoral head and neck have been resected. No acute fracture seen. IMPRESSION: Satisfactory appearance of the femoral component of a left hip prosthesis. Electronically Signed   By: Claudie Revering M.D.    On: 01/12/2019 18:12   Dg Pelvis Portable  Result Date: 01/12/2019 CLINICAL DATA:  Left hip prosthesis placement. EXAM: PORTABLE PELVIS 1-2 VIEWS COMPARISON:  Earlier today. FINDINGS: Left bipolar hip prosthesis in satisfactory position and alignment. The femoral component does not have the serrated edges seen on the images earlier today. No fracture or dislocation seen. IMPRESSION: Satisfactory postoperative appearance of a left bipolar hip prosthesis. Electronically Signed   By: Claudie Revering M.D.   On: 01/12/2019 18:12    ASSESSMENT AND PLAN:   Active Problems:   Hip fracture ([name])  *Acute left femoral neck fracture status post mechanical fall Postop day 2 Pain medications as needed Physical therapy working with patient Likely discharge in 1 to 2 days to skilled nursing facility.  *Chronic protein S and C deficiency On chronic Coumadin Vitamin K given for surgery.  Coumadin restarted  *Chronic diabetes mellitus type 2 Sliding scale insulin with Accu-Cheks per routine  *Paroxysmal atrial fibrillation Rate 60s-70s On Atenolol and coumadin  *Chronic benign essential hypertension Stable Continue atenolol, lisinopril  *Chronic hyperlipidemia, unspecified Continue statin therapy  *Chronic hypothyroidism continue Synthroid  All the records are reviewed and case discussed with Care Management/Social Workerr. Management plans discussed with the patient, family and they are in agreement.  CODE STATUS: Full  TOTAL TIME TAKING CARE OF THIS PATIENT: 35 minutes.   POSSIBLE D/C IN 1-2 DAYS, DEPENDING ON CLINICAL CONDITION.  Neita Carp M.D on 01/14/2019   Between 7am to 6pm - Pager - [phone]  After 6pm go to [email or web address] - password [email or web address] [facility]  Office  [phone]  CC: Primary care physician; Whitaker, Rolanda Jay, PA-C  Note: This dictation was prepared with Dragon dictation along with smaller phrase technology. Any  transcriptional errors that result from this process are unintentional.

## 2019-01-14 NOTE — Progress Notes (Signed)
[place] for Warfarin dosing Indication: atrial fibrillation  Allergies  Allergen Reactions  . Gentamicin Other (See Comments)    Kidney function  . Morphine And Related Other (See Comments)    AMS - agitation and delusions **No legal documents to be signed if taking, per family**    Patient Measurements: Height: 5' 7.5" (171.5 cm) Weight: 196 lb (88.9 kg) IBW/kg (Calculated) : 62.75  Vital Signs: Temp: 98.1 F (36.7 C) (03/08 0004) Temp Source: Oral (03/08 0004) BP: 159/80 (03/08 0004) Pulse Rate: 82 (03/08 0004)  Labs: Recent Labs    01/11/19 1421 01/12/19 0505 01/12/19 1017 01/13/19 0716 01/14/19 0448  HGB 13.4 13.7  --   --  12.9  HCT 42.6 41.9  --   --  38.5  PLT 184 160  --   --  137*  LABPROT 33.2* 20.0* 18.4*  --  15.8*  INR 3.3* 1.7* 1.6*  --  1.3*  CREATININE 1.44* 0.90  --  0.88 0.87  TROPONINI  --   --  <0.03  --   --     Estimated Creatinine Clearance: 59.6 mL/min (by C-G formula based on SCr of 0.87 mg/dL).   Medical History: Past Medical History:  Diagnosis Date  . Anginal pain ([place])   . Anxiety   . Diabetes mellitus without complication ([place])   . Hypertension   . Hypothyroidism   . Protein C deficiency ([place])   . Protein S deficiency ([place])     Medications:  Scheduled:  . acidophilus  1 capsule Oral Daily  . amLODipine  2.5 mg Oral Daily  . atenolol  12.5 mg Oral Daily  . atorvastatin  40 mg Oral Daily  . budesonide  3 mg Oral Daily  . clidinium-chlordiazePOXIDE  1 capsule Oral TID AC & HS  . docusate sodium  100 mg Oral BID  . DULoxetine  60 mg Oral Daily  . enoxaparin (LOVENOX) injection  40 mg Subcutaneous Q24H  . glipiZIDE  2.5 mg Oral Q breakfast  . insulin aspart  0-15 Units Subcutaneous TID WC  . insulin aspart  0-5 Units Subcutaneous QHS  . levothyroxine  88 mcg Oral QAC breakfast  . lidocaine  1 patch Transdermal Q12H  . linagliptin  5 mg Oral Daily  . lisinopril  40 mg Oral Daily   . loperamide  2 mg Oral Daily  . magnesium oxide  400 mg Oral Daily  . mesalamine  1,500 mg Oral Daily  . pantoprazole  40 mg Oral Daily  . saccharomyces boulardii  250 mg Oral Daily  . Warfarin - Pharmacist Dosing Inpatient   Does not apply q1800   Infusions:  . sodium chloride 75 mL/hr at 01/14/19 0725    Assessment: 83 yo female POD#3 to resume home dose of Warfarin at 6mg  daily.  Also ordered Lovenox 40mg  Q24H until INR is therapeutic.   Date:   INR:    Dose: 3/6       1.6        -- 3/7        --        6 mg 3/8       1.3      6 mg  Goal of Therapy:  INR 2-3 Monitor platelets by anticoagulation protocol: Yes   Plan:  Warfarin 6mg  tonight. Will monitor INR daily.  CBC will be checked at least every three (3) days while on therapy.   Olivia Canter, RPH 01/14/2019,8:37  AM

## 2019-01-14 NOTE — Progress Notes (Signed)
Patient Name: Sierra [name] Date of Encounter: 01/14/2019  Hospital Problem List     Active Problems:   Hip fracture [facility])    Patient Profile     Patient with history of protein S and C deficiency, diabetes hyperlipidemia treated with chronic warfarin therapy admitted with left hip fracture.  Is status post repair of this.  Was noted to be in atrial fibrillation flutter on admission.  Post procedure was noted to have an episode of wide-complex tachycardia..  Tolerated this well.  VT versus aberrant A. fib.  She was given IV magnesium.  Currently is in  sinus rhythm.  Heart rhythm currently sinus at 70 bpm.  Patient is asymptomatic from a cardiac standpoint at present.  Doing well.  No further arrhythmia.  Subjective   Some confusion post procedure with some hip pain.  Hemodynamically stable but relatively bradycardic.  Inpatient Medications    . acidophilus  1 capsule Oral Daily  . amLODipine  2.5 mg Oral Daily  . atenolol  12.5 mg Oral Daily  . atorvastatin  40 mg Oral Daily  . budesonide  3 mg Oral Daily  . clidinium-chlordiazePOXIDE  1 capsule Oral TID AC & HS  . docusate sodium  100 mg Oral BID  . DULoxetine  60 mg Oral Daily  . enoxaparin (LOVENOX) injection  40 mg Subcutaneous Q24H  . glipiZIDE  2.5 mg Oral Q breakfast  . insulin aspart  0-15 Units Subcutaneous TID WC  . insulin aspart  0-5 Units Subcutaneous QHS  . levothyroxine  88 mcg Oral QAC breakfast  . lidocaine  1 patch Transdermal Q12H  . linagliptin  5 mg Oral Daily  . lisinopril  40 mg Oral Daily  . loperamide  2 mg Oral Daily  . magnesium oxide  400 mg Oral Daily  . mesalamine  1,500 mg Oral Daily  . pantoprazole  40 mg Oral Daily  . saccharomyces boulardii  250 mg Oral Daily  . warfarin  6 mg Oral q1800  . Warfarin - Pharmacist Dosing Inpatient   Does not apply q1800    Vital Signs    Vitals:   01/13/19 0805 01/13/19 1616 01/13/19 1940 01/14/19 0004  BP: 140/71 (!) 152/86 (!) 163/84 (!) 159/80   Pulse: (!) 43 80 78 82  Resp: 19 20 19 20   Temp: 98.1 F (36.7 C) 98 F (36.7 C) 98.9 F (37.2 C) 98.1 F (36.7 C)  TempSrc: Oral  Oral Oral  SpO2: 95% 96% 100% 100%  Weight:      Height:        Intake/Output Summary (Last 24 hours) at 01/14/2019 1527 Last data filed at 01/14/2019 1000 Gross per 24 hour  Intake 1873.21 ml  Output 2450 ml  Net -576.79 ml   Filed Weights   01/11/19 1415  Weight: 88.9 kg    Physical Exam    GEN: Well nourished, well developed, in no acute distress.  HEENT: normal.  Neck: Supple, no JVD, carotid bruits, or masses. Cardiac: RRR, no murmurs, rubs, or gallops. No clubbing, cyanosis, edema.  Radials/DP/PT 2+ and equal bilaterally.  Respiratory:  Respirations regular and unlabored, clear to auscultation bilaterally. GI: Soft, nontender, nondistended, BS + x 4. MS: no deformity or atrophy. Skin: warm and dry, no rash. Neuro:  Strength and sensation are intact. Psych: Normal affect.  Labs    CBC Recent Labs    01/12/19 0505 01/14/19 0448  WBC 8.0 12.2*  HGB 13.7 12.9  HCT 41.9 38.5  MCV 89.0 89.1  PLT 160 038*   Basic Metabolic Panel Recent Labs    01/13/19 0716 01/14/19 0448  NA 135 134*  K 3.9 3.6  CL 105 102  CO2 20* 22  GLUCOSE 197* 192*  BUN 13 11  CREATININE 0.88 0.87  CALCIUM 7.8* 8.2*  MG 1.5*  --    Liver Function Tests No results for input(s): AST, ALT, ALKPHOS, BILITOT, PROT, ALBUMIN in the last 72 hours. No results for input(s): LIPASE, AMYLASE in the last 72 hours. Cardiac Enzymes Recent Labs    01/12/19 1017  TROPONINI <0.03   BNP No results for input(s): BNP in the last 72 hours. D-Dimer No results for input(s): DDIMER in the last 72 hours. Hemoglobin A1C No results for input(s): HGBA1C in the last 72 hours. Fasting Lipid Panel No results for input(s): CHOL, HDL, LDLCALC, TRIG, CHOLHDL, LDLDIRECT in the last 72 hours. Thyroid Function Tests No results for input(s): TSH, T4TOTAL, T3FREE, THYROIDAB  in the last 72 hours.  Invalid input(s): FREET3  Telemetry    Sinus bradycardia though had nonsustained episodes of wide-complex tachycardia last p.m.  ECG    Initially atrial fibrillation  Radiology    Dg Chest 1 View  Result Date: 01/11/2019 CLINICAL DATA:  Status post fall. Left hip pain. History of hypertension. Former smoker. EXAM: CHEST  1 VIEW COMPARISON:  02/25/2015 FINDINGS: Cardiac silhouette is normal in size. No mediastinal or hilar masses or evidence of adenopathy. Clear lungs.  No pleural effusion or pneumothorax. Skeletal structures are grossly intact. IMPRESSION: No active disease. Electronically Signed   By: Lajean Manes M.D.   On: 01/11/2019 15:33   Dg Pelvis 1-2 Views  Result Date: 01/12/2019 CLINICAL DATA:  Left hip prosthesis placement. EXAM: PELVIS - 1-2 VIEW COMPARISON:  Yesterday. FINDINGS: Interval placement of the femoral component of a left hip prosthesis. This appears to be in satisfactory position and alignment. The femoral head and neck have been resected. No acute fracture seen. IMPRESSION: Satisfactory appearance of the femoral component of a left hip prosthesis. Electronically Signed   By: Claudie Revering M.D.   On: 01/12/2019 18:12   Ct Head Wo Contrast  Result Date: 01/11/2019 CLINICAL DATA:  Altered level of consciousness. female with a history of anginal pain, anxiety, diabetes, hypertension, protein C&S deficiency who presents to the ED for left hip pain after fall. Patient was in the parking lot after shopping and fell injuring her left hip. She cannot bear weight since the fall. She denies any other injuries or complaints EXAM: CT HEAD WITHOUT CONTRAST TECHNIQUE: Contiguous axial images were obtained from the base of the skull through the vertex without intravenous contrast. COMPARISON:  None. FINDINGS: Brain: No evidence of acute infarction, hemorrhage, hydrocephalus, extra-axial collection or mass lesion/mass effect. There is age appropriate ventricular  and sulcal enlargement. Patchy areas of white matter hypoattenuation noted consistent with mild to moderate chronic microvascular ischemic change. Vascular: No hyperdense vessel or unexpected calcification. Skull: Normal. Negative for fracture or focal lesion. Sinuses/Orbits: Globes and orbits are unremarkable. The visualized sinuses and mastoid air cells are clear. Other: None. IMPRESSION: 1. No acute intracranial abnormalities. 2. Age-appropriate volume loss. Mild to moderate chronic microvascular ischemic change. Electronically Signed   By: Lajean Manes M.D.   On: 01/11/2019 15:32   Dg Pelvis Portable  Result Date: 01/12/2019 CLINICAL DATA:  Left hip prosthesis placement. EXAM: PORTABLE PELVIS 1-2 VIEWS COMPARISON:  Earlier today. FINDINGS: Left bipolar hip prosthesis in satisfactory position and alignment.  The femoral component does not have the serrated edges seen on the images earlier today. No fracture or dislocation seen. IMPRESSION: Satisfactory postoperative appearance of a left bipolar hip prosthesis. Electronically Signed   By: Claudie Revering M.D.   On: 01/12/2019 18:12   Dg Hip Unilat W Or W/o Pelvis 2-3 Views Left  Result Date: 01/11/2019 CLINICAL DATA:  Initial evaluation for acute trauma, fall. EXAM: DG HIP (WITH OR WITHOUT PELVIS) 2-3V LEFT COMPARISON:  None. FINDINGS: There is an acute fracture extending through the left femoral neck with mild superior subluxation. Femoral head remains normally position within the acetabulum. Femoral head height maintained. Bony pelvis intact. Limited views of the right hip demonstrate no acute osseous abnormality. Visible soft tissues within normal limits. IMPRESSION: Acute mildly displaced fracture of the left femoral neck. Electronically Signed   By: Jeannine Boga M.D.   On: 01/11/2019 15:21    Assessment & Plan    Patient with history of protein S and C deficiency diabetes and hyperlipidemia admitted with hip fracture.  She is status post  surgical repair.  Atrial fibrillation.  Had transient A. fib on telemetry during admission.  Was in sinus rhythm at the start of her surgery.  Last p.m. had episodic atrial fibrillation with episodic wide-complex tachycardia nonsustained.  Was given IV magnesium.  Currently sinus rhythm to sinus bradycardia.  Would agree with resuming Coumadin.  Patient received vitamin K on admission so may take some time to attain therapeutic range.  Does not appear to need bridging at present.  Nonsustained wide-complex tachycardia-VT versus aberrant A. fib.  Likely ventricular in origin.  Patient is currently stable.  No further arrhythmia on current regimen.  We will continue to follow.  Will follow.   Signed, Javier Docker Ramonia Mcclaran MD 01/14/2019, 3:27 PM  Pager: [phone]

## 2019-01-14 NOTE — Progress Notes (Signed)
Physical Therapy Treatment Patient Details Name: Sierra [name] MRN: [id number] DOB: 09-Jun-1936 Today's Date: 01/14/2019    History of Present Illness Patient is an 83 year old female admitted after fall, sustained L hip fracture. S/P hemiarthroplasty. PMH to include HTN, Anxiety, DM.    PT Comments    Patient continues to be lethargic, and has difficulty maintaining erect posture in sitting with notable R posterior lean. She has difficulty with all bed mobility and transfers and requires +2 assistance to take any semblance of "steps" or shuffling of feet. She does appear to be progressing as she was able to complete sit to stand transfer initially with max A x1, however she is still quite limited with gait and out of bed mobility.   Follow Up Recommendations  SNF     Equipment Recommendations  None recommended by PT    Recommendations for Other Services OT consult     Precautions / Restrictions Precautions Precautions: Fall;Posterior Hip Precaution Booklet Issued: No Restrictions Weight Bearing Restrictions: Yes LLE Weight Bearing: Weight bearing as tolerated Other Position/Activity Restrictions: WBAT    Mobility  Bed Mobility Overal bed mobility: Needs Assistance Bed Mobility: Supine to Sit   Sidelying to sit: Max assist       General bed mobility comments: Patient is able to provide minimal to no assistance for torso.   Transfers Overall transfer level: Needs assistance Equipment used: Rolling walker (2 wheeled) Transfers: Sit to/from [facility] Sit to Stand: Mod assist;Max assist;+2 physical assistance         General transfer comment: Patient required elevated bed height, constant cuing for keeping upright posture. Required multiple attempts and finally +2 attempt to complete bed to chair transfer as she was unable to lift her feet off the floor.   Ambulation/Gait             General Gait Details: patient unsafe to ambulate more this pm.     Stairs             Wheelchair Mobility    Modified Rankin (Stroke Patients Only)       Balance Overall balance assessment: Needs assistance;History of Falls Sitting-balance support: Bilateral upper extremity supported;Feet supported Sitting balance-Leahy Scale: Fair   Postural control: Posterior lean;Left lateral lean Standing balance support: Bilateral upper extremity supported Standing balance-Leahy Scale: Poor Standing balance comment: Unable to complete standing balance without significant assistance.                             Cognition Arousal/Alertness: Awake/alert Behavior During Therapy: WFL for tasks assessed/performed Overall Cognitive Status: Within Functional Limits for tasks assessed                                 General Comments: Patient able to follow commands and provide feedback to therapist.       Exercises Total Joint Exercises Ankle Circles/Pumps: AROM;10 reps;Both;Supine Heel Slides: AROM;AAROM;Both;15 reps Hip ABduction/ADduction: AROM;AAROM;Both;15 reps Straight Leg Raises: AROM;AAROM;Both;15 reps Long Arc Quad: AROM;AAROM;Both;15 reps Knee Flexion: AROM;AAROM;Both;15 reps    General Comments        Pertinent Vitals/Pain Pain Assessment: Faces Faces Pain Scale: Hurts little more Pain Location: L hip  Pain Descriptors / Indicators: Aching;Discomfort;Operative site guarding;Sore Pain Intervention(s): Limited activity within patient's tolerance;Monitored during session;Repositioned    Home Living  Prior Function            PT Goals (current goals can now be found in the care plan section) Acute Rehab PT Goals Patient Stated Goal: to go to STR PT Goal Formulation: With family Time For Goal Achievement: 01/20/19 Potential to Achieve Goals: Fair Progress towards PT goals: Progressing toward goals    Frequency    BID      PT Plan Current plan remains  appropriate    Co-evaluation              AM-PAC PT "6 Clicks" Mobility   Outcome Measure  Help needed turning from your back to your side while in a flat bed without using bedrails?: Total Help needed moving from lying on your back to sitting on the side of a flat bed without using bedrails?: A Lot Help needed moving to and from a bed to a chair (including a wheelchair)?: A Lot Help needed standing up from a chair using your arms (e.g., wheelchair or bedside chair)?: A Lot Help needed to walk in hospital room?: Total Help needed climbing 3-5 steps with a railing? : Total 6 Click Score: 9    End of Session Equipment Utilized During Treatment: Gait belt;Oxygen Activity Tolerance: Patient limited by fatigue;Patient limited by pain Patient left: with call bell/phone within reach;in chair;with family/visitor present Nurse Communication: Mobility status(Had to remove chair alarm) PT Visit Diagnosis: Muscle weakness (generalized) (M62.81);Unsteadiness on feet (R26.81);Difficulty in walking, not elsewhere classified (R26.2);Pain;History of falling (Z91.81) Pain - Right/Left: Left Pain - part of body: Hip     Time: 6045-4098 PT Time Calculation (min) (ACUTE ONLY): 32 min  Charges:  $Gait Training: 8-22 mins $Therapeutic Exercise: 8-22 mins                    Royce Macadamia PT, DPT, CSCS   01/14/2019, 3:43 PM

## 2019-01-15 LAB — GLUCOSE, CAPILLARY
Glucose-Capillary: 119 mg/dL — ABNORMAL HIGH (ref 70–99)
Glucose-Capillary: 141 mg/dL — ABNORMAL HIGH (ref 70–99)
Glucose-Capillary: 199 mg/dL — ABNORMAL HIGH (ref 70–99)

## 2019-01-15 LAB — PROTIME-INR
INR: 1.3 — ABNORMAL HIGH (ref 0.8–1.2)
Prothrombin Time: 16.1 seconds — ABNORMAL HIGH (ref 11.4–15.2)

## 2019-01-15 LAB — CBC
HCT: 36.3 % (ref 36.0–46.0)
Hemoglobin: 11.3 g/dL — ABNORMAL LOW (ref 12.0–15.0)
MCH: 28.9 pg (ref 26.0–34.0)
MCHC: 31.1 g/dL (ref 30.0–36.0)
MCV: 92.8 fL (ref 80.0–100.0)
Platelets: 137 10*3/uL — ABNORMAL LOW (ref 150–400)
RBC: 3.91 MIL/uL (ref 3.87–5.11)
RDW: 13.8 % (ref 11.5–15.5)
WBC: 10.4 10*3/uL (ref 4.0–10.5)
nRBC: 0 % (ref 0.0–0.2)

## 2019-01-15 LAB — BASIC METABOLIC PANEL
Anion gap: 5 (ref 5–15)
BUN: 16 mg/dL (ref 8–23)
CO2: 28 mmol/L (ref 22–32)
Calcium: 7.8 mg/dL — ABNORMAL LOW (ref 8.9–10.3)
Chloride: 104 mmol/L (ref 98–111)
Creatinine, Ser: 0.83 mg/dL (ref 0.44–1.00)
GFR calc Af Amer: >60 mL/min (ref >60)
GFR calc non Af Amer: >60 mL/min (ref >60)
Glucose, Bld: 134 mg/dL — ABNORMAL HIGH (ref 70–99)
Potassium: 3.5 mmol/L (ref 3.5–5.1)
Sodium: 137 mmol/L (ref 135–145)

## 2019-01-15 MED ORDER — ENOXAPARIN SODIUM 40 MG/0.4ML ~~LOC~~ SOLN: 40.0000 mg | SUBCUTANEOUS | 0 refills | Status: DC

## 2019-01-15 MED ORDER — WARFARIN SODIUM 6 MG PO TABS
6.0000 mg | ORAL_TABLET | Freq: Once | ORAL | Status: DC
  Filled 2019-01-15: qty 1

## 2019-01-15 NOTE — Evaluation (Signed)
Occupational Therapy Evaluation Patient Details Name: Sierra [name] MRN: [id number] DOB: 12/14/1935 Today's Date: 01/15/2019    History of Present Illness Patient is an 83 year old female admitted after fall, sustained L hip fracture. S/P hemiarthroplasty. PMH to include HTN, Anxiety, DM.   Clinical Impression   Pt seen for OT evaluation this date with PT for co-treat to address safety/transfers/DME use/strengthening. Pt was independent in all ADLs prior to surgery, per pt report (pt has been unreliable historian), not using an AD for mobility (unsure of the accuracy of this, pt reports lumbar back surgery in October 2019), and reports multiple falls 2/2 loss of balance. Spouse reports that he does most of the cooking and that their daughter sets up and manages medications but pt takes them on her own after set up. Pt is eager to return to PLOF with less pain and improved safety and independence. Pt currently requires MOD A for bed mobility (+2 for safety), MOD A x2 for physical assist for initial STS transfer from EOB, CGA for BSC transfer, MAX A for toileting hygiene once in standing, and MOD A for LB dressing due to L hip pain, decreased strength BUE/BLE, decreased balance, and decreased cognition (pt unable to recall that she was taught posterior total hip precautions as recently as yesterday), and limited AROM of L hip. Pt able to recall 0/3 posterior total hip precautions at start of session and unable to verbalize how to implement during ADL and mobility. Pt instructed extensively in posterior total hip precautions and how to implement, self care skills, falls prevention strategies, home/routines modifications, and DME/AE for LB bathing and dressing tasks. At end of session, pt able to recall 1-2/3 posterior total hip precautions. Pt would benefit from additional instruction in self care skills and techniques to help maintain precautions with or without assistive devices to support recall and  carryover prior to discharge. Recommend STR upon discharge.      Follow Up Recommendations  SNF    Equipment Recommendations  3 in 1 bedside commode    Recommendations for Other Services       Precautions / Restrictions Precautions Precautions: Fall;Posterior Hip Precaution Comments: pt unable to recall precautions, extensive education/training provided, pt able to recall 1-2/3 by end of session; requires VC throughout session to maintain Restrictions Weight Bearing Restrictions: Yes LLE Weight Bearing: Weight bearing as tolerated      Mobility Bed Mobility Overal bed mobility: Needs Assistance Bed Mobility: Supine to Sit     Supine to sit: HOB elevated;Mod assist        Transfers Overall transfer level: Needs assistance Equipment used: Rolling walker (2 wheeled) Transfers: Sit to/from Stand Sit to Stand: Mod assist;+2 physical assistance         General transfer comment: initial Mod x2 for STS from EOB, from elevated BSC required CGA and +time    Balance Overall balance assessment: Needs assistance;History of Falls Sitting-balance support: Bilateral upper extremity supported;Feet supported Sitting balance-Leahy Scale: Fair     Standing balance support: Bilateral upper extremity supported Standing balance-Leahy Scale: Poor                             ADL either performed or assessed with clinical judgement   ADL Overall ADL's : Needs assistance/impaired Eating/Feeding: Sitting;Set up   Grooming: Sitting;Set up   Upper Body Bathing: Sitting;Minimal assistance   Lower Body Bathing: Sit to/from stand;Moderate assistance   Upper Body Dressing :  Sitting;Minimal assistance   Lower Body Dressing: Sit to/from stand;Moderate assistance   Toilet Transfer: BSC;Min guard;+2 for safety/equipment;Ambulation;RW;Cueing for safety Toilet Transfer Details (indicate cue type and reason): cues for transfer technique to maintain precautions Toileting-  Clothing Manipulation and Hygiene: Maximal assistance;Sit to/from stand Toileting - Clothing Manipulation Details (indicate cue type and reason): CGA in standing with Max A from OT for hygiene     Functional mobility during ADLs: Min guard;Rolling walker;+2 for safety/equipment       Vision Baseline Vision/History: Wears glasses Wears Glasses: Reading only Patient Visual Report: No change from baseline       Perception     Praxis      Pertinent Vitals/Pain Pain Assessment: 0-10 Pain Score: 3  Pain Location: L hip after mobility once sitting, up to 7/10 with initial transfer Pain Descriptors / Indicators: Aching;Discomfort;Operative site guarding;Sore Pain Intervention(s): Limited activity within patient's tolerance;Monitored during session;Repositioned     Hand Dominance Right   Extremity/Trunk Assessment Upper Extremity Assessment Upper Extremity Assessment: Generalized weakness   Lower Extremity Assessment Lower Extremity Assessment: Generalized weakness;LLE deficits/detail LLE Deficits / Details: generally weak, favors LLE when ambulating LLE: Unable to fully assess due to pain       Communication Communication Communication: No difficulties   Cognition Arousal/Alertness: Awake/alert Behavior During Therapy: WFL for tasks assessed/performed Overall Cognitive Status: No family/caregiver present to determine baseline cognitive functioning                                 General Comments: oriented x4, follows commands, unable to recall precautions at all (reports she did not know she had any precautions), states "I feel like I lost 2 days of my life"   General Comments       Exercises Other Exercises Other Exercises: pt/spouse instructed in posterior THPs and how to maintain during ADL and functional mobility Other Exercises: pt instructed in use of AE for LB dressing to maintain posterior THPs   Shoulder Instructions      Home Living  Family/patient expects to be discharged to:: Private residence Living Arrangements: Spouse/significant other Available Help at Discharge: Family Type of Home: House Home Access: Level entry     Home Layout: One level     Bathroom Shower/Tub: Walk-in shower;Tub/shower unit   Bathroom Toilet: Handicapped height Bathroom Accessibility: Yes   Home Equipment: Walker - 4 wheels;Shower seat;Hand held shower head;Adaptive equipment;Grab bars - toilet;Grab bars - tub/shower Adaptive Equipment: Reacher Additional Comments: patient was driving and receiving HHPT prior to fall      Prior Functioning/Environment Level of Independence: Independent        Comments: pt reports not using AD for mobility, spouse does most of the cooking        OT Problem List: Decreased strength;Decreased range of motion;Decreased knowledge of use of DME or AE;Decreased knowledge of precautions;Decreased activity tolerance;Decreased cognition;Impaired balance (sitting and/or standing);Pain      OT Treatment/Interventions: Self-care/ADL training;Balance training;Therapeutic exercise;Therapeutic activities;DME and/or AE instruction;Patient/family education    OT Goals(Current goals can be found in the care plan section) Acute Rehab OT Goals Patient Stated Goal: to go to STR OT Goal Formulation: With patient Time For Goal Achievement: 01/29/19 Potential to Achieve Goals: Good ADL Goals Pt Will Perform Lower Body Dressing: with min assist;with mod assist;sit to/from stand;with adaptive equipment(maintaining post THPs) Pt Will Transfer to Toilet: with min assist;bedside commode;ambulating(maintaining post THPs, LRAD for amb) Additional ADL Goal #1:  Pt will verbalize independently 3/3 posterior THPs and how to maintain during bed mobility, LB dressing, and toileting.  OT Frequency: Min 2X/week   Barriers to D/C:            Co-evaluation PT/OT/SLP Co-Evaluation/Treatment: Yes Reason for Co-Treatment: To  address functional/ADL transfers;Necessary to address cognition/behavior during functional activity;For patient/therapist safety PT goals addressed during session: Mobility/safety with mobility;Balance;Proper use of DME;Strengthening/ROM OT goals addressed during session: Strengthening/ROM;Proper use of Adaptive equipment and DME;ADL's and self-care      AM-PAC OT "6 Clicks" Daily Activity     Outcome Measure Help from another person eating meals?: None Help from another person taking care of personal grooming?: None Help from another person toileting, which includes using toliet, bedpan, or urinal?: A Lot Help from another person bathing (including washing, rinsing, drying)?: A Lot Help from another person to put on and taking off regular upper body clothing?: A Little Help from another person to put on and taking off regular lower body clothing?: A Lot 6 Click Score: 17   End of Session Equipment Utilized During Treatment: Gait belt;Rolling walker;Oxygen(2L O2 initially, left off at end of OT session w/ PT in room ) Nurse Communication: Other (comment)(new external catheter needed, pt had loose BM)  Activity Tolerance: Patient tolerated treatment well Patient left: in chair;with call bell/phone within reach;with chair alarm set  OT Visit Diagnosis: Other abnormalities of gait and mobility (R26.89);Repeated falls (R29.6);Muscle weakness (generalized) (M62.81);Pain Pain - Right/Left: Left Pain - part of body: Hip                Time: 5449-2010 OT Time Calculation (min): 52 min Charges:  OT General Charges $OT Visit: 1 Visit OT Evaluation $OT Eval Moderate Complexity: 1 Mod OT Treatments $Self Care/Home Management : 23-37 mins  Jeni Salles, MPH, MS, OTR/L ascom [phone] 01/15/19, 10:16 AM

## 2019-01-15 NOTE — Progress Notes (Signed)
Sleepy this shift but will awaken to voice. No complaints of pain this shift. Iv infusing without difficulty. 3 person assist to move pt.  Voiding in [place] without difficulty. Dressing dry and intact.

## 2019-01-15 NOTE — Plan of Care (Signed)
  Problem: Clinical Measurements: Goal: Ability to maintain clinical measurements within normal limits will improve Outcome: Progressing Goal: Will remain free from infection Outcome: Progressing Goal: Diagnostic test results will improve Outcome: Progressing Goal: Respiratory complications will improve Outcome: Progressing Goal: Cardiovascular complication will be avoided Outcome: Progressing   Problem: Nutrition: Goal: Adequate nutrition will be maintained Outcome: Progressing   Problem: Coping: Goal: Level of anxiety will decrease Outcome: Progressing   Problem: Elimination: Goal: Will not experience complications related to urinary retention Outcome: Progressing   Problem: Pain Managment: Goal: General experience of comfort will improve Outcome: Progressing   Problem: Safety: Goal: Ability to remain free from injury will improve Outcome: Progressing   Problem: Skin Integrity: Goal: Risk for impaired skin integrity will decrease Outcome: Progressing   

## 2019-01-15 NOTE — Care Management Important Message (Signed)
Important Message  Patient Details  Name: Sierra [name] MRN: [id number] Date of Birth: 05/13/1936   Medicare Important Message Given:  Yes    Juliann Pulse A Tammy Ericsson 01/15/2019, 11:08 AM

## 2019-01-15 NOTE — Progress Notes (Signed)
PT Cancellation Note  Patient Details Name: Sierra [name] MRN: [id number] DOB: Sep 29, 1936   Cancelled Treatment:    Reason Eval/Treat Not Completed: Patient declined, no reason specified. Patient sleeping. She reports she is supposed to leave for rehab today. Declines working with PT again prior to transfer.    [name] 01/15/2019, 3:56 PM

## 2019-01-15 NOTE — Discharge Summary (Signed)
[facility] [place] NAME: Sierra [name]    MR#:  [id number]  DATE OF BIRTH:  29-Mar-1936  DATE OF ADMISSION:  01/11/2019 ADMITTING PHYSICIAN: Gorden Harms, MD  DATE OF DISCHARGE: 01/15/2019  PRIMARY CARE PHYSICIAN: Donnamarie Rossetti, PA-C   ADMISSION DIAGNOSIS:  Fall, initial encounter [W19.XXXA] Closed left hip fracture, initial encounter ([place]) [S72.002A]  DISCHARGE DIAGNOSIS:  Active Problems:   Hip fracture ([place])   SECONDARY DIAGNOSIS:   Past Medical History:  Diagnosis Date  . Anginal pain ([place])   . Anxiety   . Diabetes mellitus without complication ([place])   . Hypertension   . Hypothyroidism   . Protein C deficiency ([place])   . Protein S deficiency ([place])      ADMITTING HISTORY  HISTORY OF PRESENT ILLNESS: Sierra [name]  is a 83 y.o. female with a known history per below which includes protein S and C deficiency-on Coumadin, was shopping earlier today when her legs buckled and she fell in the parking lot of a shopping center, patient developed subsequent left hip pain/inability to stand/ambulate, in the emergency room patient was found to have left femoral neck fracture, family at the bedside, pain is reasonably controlled, INR is currently pending, patient now be admitted for acute left hip fracture status post fall.  HOSPITAL COURSE:   *Acute left femoral neck fracture status post mechanical fall Postop day 3 Pain medications - tylenol PRN Physical therapy working with patient Discharge to skilled nursing facility today.  *Chronic protein S and C deficiency On chronic Coumadin Vitamin K given for surgery.  Coumadin restarted  *Chronic diabetes mellitus type 2 Sliding scale insulin with Accu-Cheks per routine  *Paroxysmal atrial fibrillation Rate controlled On Atenolol and coumadin  *Chronic benign essential hypertension Stable Continue atenolol, lisinopril  *Chronic hyperlipidemia, unspecified Continue  statin therapy  *Chronic hypothyroidism continue Synthroid  Lovenox for 4 more days till INR therapeutic  Stable for d/c to SNF  CONSULTS OBTAINED:  Treatment Team:  Teodoro Spray, MD Leim Fabry, MD  DRUG ALLERGIES:   Allergies  Allergen Reactions  . Gentamicin Other (See Comments)    Kidney function  . Morphine And Related Other (See Comments)    AMS - agitation and delusions **No legal documents to be signed if taking, per family**    DISCHARGE MEDICATIONS:   Allergies as of 01/15/2019      Reactions   Gentamicin Other (See Comments)   Kidney function   Morphine And Related Other (See Comments)   AMS - agitation and delusions **No legal documents to be signed if taking, per family**      Medication List    STOP taking these medications   amLODipine 2.5 MG tablet Commonly known as:  NORVASC   lidocaine 5 % Commonly known as:  Lidoderm     TAKE these medications   alendronate 70 MG tablet Commonly known as:  FOSAMAX Take 70 mg by mouth once a week.   amitriptyline 10 MG tablet Commonly known as:  ELAVIL Take 10 mg by mouth at bedtime as needed for sleep.   Apriso 0.375 g 24 hr capsule Generic drug:  mesalamine Take 1.5 g by mouth daily.   atenolol 25 MG tablet Commonly known as:  TENORMIN Take 25 mg by mouth daily.   atorvastatin 40 MG tablet Commonly known as:  LIPITOR Take 40 mg by mouth daily.   calcium carbonate 1500 (600 Ca) MG Tabs tablet Commonly known as:  OSCAL Take 600 mg of elemental calcium by mouth daily.   cholecalciferol 25 MCG (1000 UT) tablet Commonly known as:  VITAMIN D3 Take 3,000 Units by mouth daily. (taken with calcium)   DULoxetine 60 MG capsule Commonly known as:  CYMBALTA Take 60 mg by mouth at bedtime.   enoxaparin 40 MG/0.4ML injection Commonly known as:  LOVENOX Inject 0.4 mLs (40 mg total) into the skin daily. Start taking on:  January 16, 2019   glipiZIDE 2.5 MG 24 hr tablet Commonly known as:   GLUCOTROL XL Take 2.5 mg by mouth daily.   levothyroxine 88 MCG tablet Commonly known as:  SYNTHROID, LEVOTHROID Take 88 mcg by mouth daily before breakfast.   lisinopril 40 MG tablet Commonly known as:  PRINIVIL,ZESTRIL Take 40 mg by mouth daily.   loperamide 2 MG tablet Commonly known as:  IMODIUM A-D Take 2 mg by mouth daily.   [facility] Colon Health Caps Take 1 capsule by mouth daily.   saccharomyces boulardii 250 MG capsule Commonly known as:  FLORASTOR Take 250 mg by mouth daily.   sitaGLIPtin-metformin 50-500 MG tablet Commonly known as:  JANUMET Take 2 tablets by mouth every evening.   warfarin 5 MG tablet Commonly known as:  COUMADIN Take 5 mg by mouth daily.   warfarin 1 MG tablet Commonly known as:  COUMADIN Take 1 mg by mouth at bedtime.       Today   VITAL SIGNS:  Blood pressure (!) 147/83, pulse 94, temperature 97.6 F (36.4 C), temperature source Oral, resp. rate 18, height 5' 7.5" (1.715 m), weight 88.9 kg, SpO2 95 %.  I/O:    Intake/Output Summary (Last 24 hours) at 01/15/2019 1129 Last data filed at 01/15/2019 1016 Gross per 24 hour  Intake 648.33 ml  Output 950 ml  Net -301.67 ml    PHYSICAL EXAMINATION:  Physical Exam  GENERAL:  83 y.o.-year-old patient lying in the bed with no acute distress.  LUNGS: Normal breath sounds bilaterally, no wheezing, rales,rhonchi or crepitation. No use of accessory muscles of respiration.  CARDIOVASCULAR: S1, S2 normal. No murmurs, rubs, or gallops.  ABDOMEN: Soft, non-tender, non-distended. Bowel sounds present. No organomegaly or mass.  NEUROLOGIC: Moves all 4 extremities. PSYCHIATRIC: The patient is alert and oriented x 3.  SKIN: No obvious rash, lesion, or ulcer.   DATA REVIEW:   CBC Recent Labs  Lab 01/15/19 0452  WBC 10.4  HGB 11.3*  HCT 36.3  PLT 137*    Chemistries  Recent Labs  Lab 01/11/19 1421  01/13/19 0716  01/15/19 0452  NA 139   < > 135   < > 137  K 4.1   < > 3.9   < > 3.5   CL 107   < > 105   < > 104  CO2 23   < > 20*   < > 28  GLUCOSE 103*   < > 197*   < > 134*  BUN 28*   < > 13   < > 16  CREATININE 1.44*   < > 0.88   < > 0.83  CALCIUM 9.0   < > 7.8*   < > 7.8*  MG  --   --  1.5*  --   --   AST 21  --   --   --   --   ALT 15  --   --   --   --   ALKPHOS 47  --   --   --   --  BILITOT 1.0  --   --   --   --    < > = values in this interval not displayed.    Cardiac Enzymes Recent Labs  Lab 01/12/19 1017  TROPONINI <0.03    Microbiology Results  Results for orders placed or performed during the hospital encounter of 01/11/19  MRSA PCR Screening     Status: None   Collection Time: 01/12/19  2:49 AM  Result Value Ref Range Status   MRSA by PCR NEGATIVE NEGATIVE Final    Comment:        The GeneXpert MRSA Assay (FDA approved for NASAL specimens only), is one component of a comprehensive MRSA colonization surveillance program. It is not intended to diagnose MRSA infection nor to guide or monitor treatment for MRSA infections. Performed at [facility], [address] [place], [place] [address]     RADIOLOGY:  No results found.  Follow up with PCP in 1 week.  Management plans discussed with the patient, family and they are in agreement.  CODE STATUS:     Code Status Orders  (From admission, onward)         Start     Ordered   01/11/19 1554  Full code  Continuous     01/11/19 1553        Code Status History    Date Active Date Inactive Code Status Order ID Comments User Context   05/13/2015 1528 05/13/2015 1910 Full Code [id number]  Schnier, Dolores Lory, MD Inpatient    Advance Directive Documentation     Most Recent Value  Type of Advance Directive  Healthcare Power of Attorney, Living will  Pre-existing out of facility DNR order (yellow form or pink MOST form)  -  "MOST" Form in Place?  -      TOTAL TIME TAKING CARE OF THIS PATIENT ON DAY OF DISCHARGE: more than 30 minutes.   Leia Alf Jean Alejos M.D on 01/15/2019  at 11:29 AM  Between 7am to 6pm - Pager - [phone]  After 6pm go to [email or web address] - password [email or web address] [facility]  Office  [phone]  CC: Primary care physician; Whitaker, Rolanda Jay, PA-C  Note: This dictation was prepared with Dragon dictation along with smaller phrase technology. Any transcriptional errors that result from this process are unintentional.

## 2019-01-15 NOTE — Progress Notes (Signed)
Physical Therapy Treatment Patient Details Name: Sierra [name] MRN: [id number] DOB: May 31, 1936 Today's Date: 01/15/2019    History of Present Illness Patient is an 83 year old female admitted after fall, sustained L hip fracture. S/P hemiarthroplasty. PMH to include HTN, Anxiety, DM.    PT Comments    Patient does not recall events from the last 2 days. Improved ability to perform bed mobility this morning, requiring max cues to assist herself in getting to the edge of the bed. Improved transfers from sit to stand this morning. Requiring min +2 assist for safety. Patient was able to walk 5 feet with constant cues for effective use of RW, upright posture, WB on L LE. Once seated in chair, patient then stated she needed bedpan. Assisted patient to [facility] and then back to recliner. Patient was able to stand with min guard assist while being cleaned up.  Patient will benefit from continued skilled PT to address her weakness, difficulty walking, poor activity tolerance and decreased independence with mobility.        Follow Up Recommendations  SNF     Equipment Recommendations  None recommended by PT    Recommendations for Other Services       Precautions / Restrictions Precautions Precautions: Fall;Posterior Hip Precaution Comments: pt unable to recall precautions, extensive education/training provided, pt able to recall 1-2/3 by end of session; requires VC throughout session to maintain Restrictions Weight Bearing Restrictions: Yes LLE Weight Bearing: Weight bearing as tolerated    Mobility  Bed Mobility Overal bed mobility: Needs Assistance Bed Mobility: Supine to Sit     Supine to sit: HOB elevated;Mod assist     General bed mobility comments: Patient requires max cues for assisting herself  Transfers Overall transfer level: Needs assistance Equipment used: Rolling walker (2 wheeled) Transfers: Sit to/from Stand Sit to Stand: Mod assist;+2 physical assistance          General transfer comment: initial Mod x2 for STS from EOB, from elevated BSC required CGA and +time  Ambulation/Gait Ambulation/Gait assistance: Min assist;+2 physical assistance Gait Distance (Feet): 5 Feet Assistive device: Rolling walker (2 wheeled) Gait Pattern/deviations: Step-to pattern;Decreased step length - left;Decreased step length - right;Shuffle;Trunk flexed Gait velocity: decreased   General Gait Details: improved ability this day, however requires cues to put weight on L LE, stand tall, use walker to assist with off weighting L LE.    Stairs             Wheelchair Mobility    Modified Rankin (Stroke Patients Only)       Balance Overall balance assessment: Needs assistance;History of Falls Sitting-balance support: Bilateral upper extremity supported;Feet supported Sitting balance-Leahy Scale: Fair     Standing balance support: Bilateral upper extremity supported Standing balance-Leahy Scale: Fair Standing balance comment: improved ability to stand with min guard today, however continues to require cues for WB and to use Walker effectively.                             Cognition Arousal/Alertness: Awake/alert Behavior During Therapy: WFL for tasks assessed/performed Overall Cognitive Status: Impaired/Different from baseline Area of Impairment: Orientation;Safety/judgement;Attention;Awareness;Problem solving;Following commands                 Orientation Level: Disoriented to;Situation Current Attention Level: Alternating Memory: Decreased short-term memory;Decreased recall of precautions   Safety/Judgement: Decreased awareness of safety;Decreased awareness of deficits Awareness: Intellectual   General Comments: oriented x4, follows commands, unable  to recall precautions at all (reports she did not know she had any precautions), states "I feel like I lost 2 days of my life"      Exercises Total Joint Exercises Ankle Circles/Pumps:  AROM;10 reps;Both Hip ABduction/ADduction: AAROM;10 reps;Supine;Left Long Arc Quad: 5 reps;AROM;Seated;Left Other Exercises Other Exercises: pt/spouse instructed in posterior THPs and how to maintain during ADL and functional mobility Other Exercises: pt instructed in use of AE for LB dressing to maintain posterior THPs    General Comments        Pertinent Vitals/Pain Pain Assessment: 0-10 Pain Score: 3  Pain Location: L hip after mobility once sitting, up to 7/10 with initial transfer Pain Descriptors / Indicators: Aching;Discomfort;Operative site guarding;Sore Pain Intervention(s): Monitored during session;Repositioned    Home Living Family/patient expects to be discharged to:: Private residence Living Arrangements: Spouse/significant other Available Help at Discharge: Family Type of Home: House Home Access: Level entry   Home Layout: One level Home Equipment: Environmental consultant - 4 wheels;Shower seat;Hand held shower head;Adaptive equipment;Grab bars - toilet;Grab bars - tub/shower Additional Comments: patient was driving and receiving HHPT prior to fall    Prior Function Level of Independence: Independent      Comments: pt reports not using AD for mobility, spouse does most of the cooking   PT Goals (current goals can now be found in the care plan section) Acute Rehab PT Goals Patient Stated Goal: to go to STR PT Goal Formulation: With patient Time For Goal Achievement: 01/20/19 Potential to Achieve Goals: Good Progress towards PT goals: Progressing toward goals    Frequency    BID      PT Plan Current plan remains appropriate    Co-evaluation PT/OT/SLP Co-Evaluation/Treatment: Yes Reason for Co-Treatment: For patient/therapist safety PT goals addressed during session: Mobility/safety with mobility;Balance;Proper use of DME;Strengthening/ROM OT goals addressed during session: Proper use of Adaptive equipment and DME;ADL's and self-care;Strengthening/ROM      AM-PAC  PT "6 Clicks" Mobility   Outcome Measure  Help needed turning from your back to your side while in a flat bed without using bedrails?: A Lot Help needed moving from lying on your back to sitting on the side of a flat bed without using bedrails?: A Lot Help needed moving to and from a bed to a chair (including a wheelchair)?: A Lot Help needed standing up from a chair using your arms (e.g., wheelchair or bedside chair)?: A Little Help needed to walk in hospital room?: A Little Help needed climbing 3-5 steps with a railing? : Total 6 Click Score: 13    End of Session Equipment Utilized During Treatment: Gait belt;Oxygen Activity Tolerance: Patient limited by fatigue Patient left: in chair;with chair alarm set;with call bell/phone within reach Nurse Communication: Mobility status PT Visit Diagnosis: Muscle weakness (generalized) (M62.81);Unsteadiness on feet (R26.81);Difficulty in walking, not elsewhere classified (R26.2);Pain;History of falling (Z91.81) Pain - Right/Left: Left Pain - part of body: Hip     Time: 0923-1000 PT Time Calculation (min) (ACUTE ONLY): 37 min  Charges:  $Gait Training: 8-22 mins $Therapeutic Exercise: 8-22 mins                     Anzley Dibbern, PT, GCS 01/15/19,11:17 AM

## 2019-01-15 NOTE — Clinical Social Work Note (Signed)
Patient is medically ready for discharge today to [facility]. CSW notified patient and husband at bedside of discharge today. CSW also notified Seth Bake at [facility] of discharge today. Patient will be transported by EMS. RN to call report and call for transport.   [place], [place]

## 2019-01-15 NOTE — Progress Notes (Signed)
   Subjective: 3 Days Post-Op Procedure(s) (LRB): ARTHROPLASTY BIPOLAR HIP (HEMIARTHROPLASTY), LEFT (Left) Patient reports pain as mild.  Pain controlled with tylenol Patient is well, and has had no acute complaints or problems Denies any CP, SOB, ABD pain. We will continue therapy today.  Plan is to go Skilled nursing facility after hospital stay.  Objective: Vital signs in last 24 hours: Temp:  [97.6 F (36.4 C)] 97.6 F (36.4 C) (03/09 0733) Pulse Rate:  [76-86] 86 (03/09 0733) Resp:  [18] 18 (03/09 0733) BP: (108-147)/(67-86) 147/83 (03/09 0733) SpO2:  [98 %-100 %] 100 % (03/09 0733)  Intake/Output from previous day: 03/08 0701 - 03/09 0700 In: 1990.8 [I.V.:1790.8; IV Piggyback:200] Out: 2050 [Urine:2050] Intake/Output this shift: No intake/output data recorded.  Recent Labs    01/14/19 0448 01/15/19 0452  HGB 12.9 11.3*   Recent Labs    01/14/19 0448 01/15/19 0452  WBC 12.2* 10.4  RBC 4.32 3.91  HCT 38.5 36.3  PLT 137* 137*   Recent Labs    01/14/19 0448 01/15/19 0452  NA 134* 137  K 3.6 3.5  CL 102 104  CO2 22 28  BUN 11 16  CREATININE 0.87 0.83  GLUCOSE 192* 134*  CALCIUM 8.2* 7.8*   Recent Labs    01/14/19 0448 01/15/19 0452  INR 1.3* 1.3*    EXAM General - Patient is Alert, Appropriate and Oriented Extremity - Neurovascular intact Sensation intact distally Intact pulses distally Dorsiflexion/Plantar flexion intact No cellulitis present Compartment soft Dressing - dressing C/D/I and scant drainage Motor Function - intact, moving foot and toes well on exam.   Past Medical History:  Diagnosis Date  . Anginal pain ([place])   . Anxiety   . Diabetes mellitus without complication ([place])   . Hypertension   . Hypothyroidism   . Protein C deficiency ([place])   . Protein S deficiency ([place])     Assessment/Plan:   3 Days Post-Op Procedure(s) (LRB): ARTHROPLASTY BIPOLAR HIP (HEMIARTHROPLASTY), LEFT (Left) Active Problems:   Hip fracture  ([name])  Estimated body mass index is 30.24 kg/m as calculated from the following:   Height as of this encounter: 5' 7.5" (1.715 m).   Weight as of this encounter: 88.9 kg. Advance diet Up with therapy, WBAT Needs BM Pain controlled with tylenol Labs and vital signs stable INR 1.3, pharmacy managing Coumadin. Continue with Lovenox until INR therapeutic   CM to assist with discharge to SNF Follow-up with [facility] in 2 weeks     DVT Prophylaxis - Lovenox, Coumadin and TED hose, SCDs Weight-Bearing as tolerated to left leg   T. Rachelle Hora, PA-C [place] 01/15/2019, 8:08 AM

## 2019-01-15 NOTE — Progress Notes (Addendum)
[place] for Warfarin dosing Indication: atrial fibrillation  Allergies  Allergen Reactions  . Gentamicin Other (See Comments)    Kidney function  . Morphine And Related Other (See Comments)    AMS - agitation and delusions **No legal documents to be signed if taking, per family**    Patient Measurements: Height: 5' 7.5" (171.5 cm) Weight: 196 lb (88.9 kg) IBW/kg (Calculated) : 62.75  Vital Signs: Temp: 97.6 F (36.4 C) (03/08 2310) Temp Source: Oral (03/08 2310) BP: 138/86 (03/08 2310) Pulse Rate: 76 (03/08 2310)  Labs: Recent Labs    01/12/19 1017 01/13/19 0716 01/14/19 0448 01/15/19 0452  HGB  --   --  12.9 11.3*  HCT  --   --  38.5 36.3  PLT  --   --  137* 137*  LABPROT 18.4*  --  15.8* 16.1*  INR 1.6*  --  1.3* 1.3*  CREATININE  --  0.88 0.87 0.83  TROPONINI <0.03  --   --   --     Estimated Creatinine Clearance: 62.5 mL/min (by C-G formula based on SCr of 0.83 mg/dL).   Medical History: Past Medical History:  Diagnosis Date  . Anginal pain ([place])   . Anxiety   . Diabetes mellitus without complication ([place])   . Hypertension   . Hypothyroidism   . Protein C deficiency ([place])   . Protein S deficiency ([place])     Medications:  Scheduled:  . acidophilus  1 capsule Oral Daily  . amLODipine  2.5 mg Oral Daily  . atenolol  12.5 mg Oral Daily  . atorvastatin  40 mg Oral Daily  . budesonide  3 mg Oral Daily  . clidinium-chlordiazePOXIDE  1 capsule Oral TID AC & HS  . docusate sodium  100 mg Oral BID  . DULoxetine  60 mg Oral Daily  . enoxaparin (LOVENOX) injection  40 mg Subcutaneous Q24H  . glipiZIDE  2.5 mg Oral Q breakfast  . insulin aspart  0-15 Units Subcutaneous TID WC  . insulin aspart  0-5 Units Subcutaneous QHS  . levothyroxine  88 mcg Oral QAC breakfast  . lidocaine  1 patch Transdermal Q12H  . linagliptin  5 mg Oral Daily  . lisinopril  40 mg Oral Daily  . loperamide  2 mg Oral Daily  . magnesium  oxide  400 mg Oral Daily  . mesalamine  1,500 mg Oral Daily  . pantoprazole  40 mg Oral Daily  . saccharomyces boulardii  250 mg Oral Daily  . Warfarin - Pharmacist Dosing Inpatient   Does not apply q1800   Infusions:  . sodium chloride 75 mL/hr at 01/14/19 2354    Assessment: 83 yo female POD#3 to resume home dose of Warfarin at 6mg  daily.  Also ordered Lovenox 40mg  Q24H until INR is therapeutic.   Date:   INR:    Dose: 3/6       1.6        -- 3/7        --        6 mg 3/8       1.3       6 mg 3/9       1.3        Goal of Therapy:  INR 2-3 Monitor platelets by anticoagulation protocol: Yes   Plan:  Warfarin 6mg  tonight. Will monitor INR daily.  CBC will be checked at least every three (3) days while on therapy.  Continuing  Lovenox until INR therapeutic.   Lu Duffel, PharmD, BCPS Clinical Pharmacist 01/15/2019 7:32 AM

## 2019-01-16 DIAGNOSIS — E119 Type 2 diabetes mellitus without complications: Secondary | ICD-10-CM

## 2019-01-16 DIAGNOSIS — I480 Paroxysmal atrial fibrillation: Secondary | ICD-10-CM

## 2019-01-16 DIAGNOSIS — I10 Essential (primary) hypertension: Secondary | ICD-10-CM

## 2019-01-16 DIAGNOSIS — F39 Unspecified mood [affective] disorder: Secondary | ICD-10-CM

## 2019-01-16 DIAGNOSIS — D6851 Activated protein C resistance: Secondary | ICD-10-CM

## 2019-01-16 DIAGNOSIS — S72001A Fracture of unspecified part of neck of right femur, initial encounter for closed fracture: Secondary | ICD-10-CM

## 2019-01-16 LAB — SURGICAL PATHOLOGY

## 2019-02-08 DIAGNOSIS — S72001A Fracture of unspecified part of neck of right femur, initial encounter for closed fracture: Secondary | ICD-10-CM

## 2019-02-08 DIAGNOSIS — I480 Paroxysmal atrial fibrillation: Secondary | ICD-10-CM

## 2019-02-08 DIAGNOSIS — F39 Unspecified mood [affective] disorder: Secondary | ICD-10-CM

## 2019-02-08 DIAGNOSIS — E119 Type 2 diabetes mellitus without complications: Secondary | ICD-10-CM

## 2019-07-13 ENCOUNTER — Other Ambulatory Visit: Payer: Self-pay | Admitting: Neurosurgery

## 2019-07-13 DIAGNOSIS — G8929 Other chronic pain: Secondary | ICD-10-CM

## 2019-07-17 ENCOUNTER — Other Ambulatory Visit: Payer: Self-pay | Admitting: Family Medicine

## 2019-07-17 DIAGNOSIS — Z1231 Encounter for screening mammogram for malignant neoplasm of breast: Secondary | ICD-10-CM

## 2019-07-26 ENCOUNTER — Other Ambulatory Visit: Payer: Self-pay

## 2019-07-26 ENCOUNTER — Ambulatory Visit
Admission: RE | Admit: 2019-07-26 | Discharge: 2019-07-26 | Disposition: A | Discharge status: Home / Self Care | Payer: Medicare Other | Source: Ambulatory Visit | Attending: Neurosurgery | Admitting: Neurosurgery

## 2019-07-26 DIAGNOSIS — M5442 Lumbago with sciatica, left side: Secondary | ICD-10-CM | POA: Insufficient documentation

## 2019-07-26 DIAGNOSIS — G8929 Other chronic pain: Secondary | ICD-10-CM | POA: Insufficient documentation

## 2019-07-26 LAB — POCT I-STAT CREATININE: Creatinine, Ser: 1 mg/dL (ref 0.44–1.00)

## 2019-07-26 MED ORDER — GADOBUTROL 1 MMOL/ML IV SOLN
8.0000 mL | Freq: Once | INTRAVENOUS | Status: AC | PRN
  Administered 2019-07-26: 8 mL via INTRAVENOUS

## 2019-08-06 DIAGNOSIS — M79605 Pain in left leg: Secondary | ICD-10-CM | POA: Insufficient documentation

## 2019-08-08 ENCOUNTER — Emergency Department: Payer: Medicare Other

## 2019-08-08 ENCOUNTER — Inpatient Hospital Stay
Admission: EM | Admit: 2019-08-08 | Discharge: 2019-08-11 | DRG: 872 | Disposition: A | Discharge status: Home / Self Care | Payer: Medicare Other | Attending: Internal Medicine | Admitting: Internal Medicine

## 2019-08-08 DIAGNOSIS — R410 Disorientation, unspecified: Secondary | ICD-10-CM

## 2019-08-08 DIAGNOSIS — A419 Sepsis, unspecified organism: Secondary | ICD-10-CM | POA: Diagnosis present

## 2019-08-08 DIAGNOSIS — A4151 Sepsis due to Escherichia coli [E. coli]: Secondary | ICD-10-CM | POA: Diagnosis not present

## 2019-08-08 DIAGNOSIS — Z803 Family history of malignant neoplasm of breast: Secondary | ICD-10-CM

## 2019-08-08 DIAGNOSIS — N390 Urinary tract infection, site not specified: Secondary | ICD-10-CM | POA: Diagnosis not present

## 2019-08-08 DIAGNOSIS — Z20828 Contact with and (suspected) exposure to other viral communicable diseases: Secondary | ICD-10-CM | POA: Diagnosis present

## 2019-08-08 DIAGNOSIS — Z23 Encounter for immunization: Secondary | ICD-10-CM

## 2019-08-08 DIAGNOSIS — I10 Essential (primary) hypertension: Secondary | ICD-10-CM | POA: Diagnosis present

## 2019-08-08 DIAGNOSIS — E119 Type 2 diabetes mellitus without complications: Secondary | ICD-10-CM | POA: Diagnosis present

## 2019-08-08 DIAGNOSIS — Z87891 Personal history of nicotine dependence: Secondary | ICD-10-CM | POA: Diagnosis not present

## 2019-08-08 DIAGNOSIS — Z7901 Long term (current) use of anticoagulants: Secondary | ICD-10-CM

## 2019-08-08 DIAGNOSIS — Z96642 Presence of left artificial hip joint: Secondary | ICD-10-CM | POA: Diagnosis present

## 2019-08-08 DIAGNOSIS — F419 Anxiety disorder, unspecified: Secondary | ICD-10-CM | POA: Diagnosis present

## 2019-08-08 DIAGNOSIS — E039 Hypothyroidism, unspecified: Secondary | ICD-10-CM | POA: Diagnosis present

## 2019-08-08 DIAGNOSIS — Z885 Allergy status to narcotic agent status: Secondary | ICD-10-CM

## 2019-08-08 DIAGNOSIS — Z79899 Other long term (current) drug therapy: Secondary | ICD-10-CM

## 2019-08-08 DIAGNOSIS — D6859 Other primary thrombophilia: Secondary | ICD-10-CM | POA: Diagnosis present

## 2019-08-08 DIAGNOSIS — Z79890 Hormone replacement therapy (postmenopausal): Secondary | ICD-10-CM

## 2019-08-08 LAB — URINALYSIS, COMPLETE (UACMP) WITH MICROSCOPIC
Bilirubin Urine: NEGATIVE
Glucose, UA: NEGATIVE mg/dL
Ketones, ur: NEGATIVE mg/dL
Nitrite: POSITIVE — AB
Protein, ur: 30 mg/dL — AB
Specific Gravity, Urine: 1.024 (ref 1.005–1.030)
pH: 6 (ref 5.0–8.0)

## 2019-08-08 LAB — CBC WITH DIFFERENTIAL/PLATELET
Abs Immature Granulocytes: 0.09 10*3/uL — ABNORMAL HIGH (ref 0.00–0.07)
Basophils Absolute: 0 10*3/uL (ref 0.0–0.1)
Basophils Relative: 0 %
Eosinophils Absolute: 0 10*3/uL (ref 0.0–0.5)
Eosinophils Relative: 0 %
HCT: 43.9 % (ref 36.0–46.0)
Hemoglobin: 14.2 g/dL (ref 12.0–15.0)
Immature Granulocytes: 1 %
Lymphocytes Relative: 6 %
Lymphs Abs: 0.9 10*3/uL (ref 0.7–4.0)
MCH: 28.2 pg (ref 26.0–34.0)
MCHC: 32.3 g/dL (ref 30.0–36.0)
MCV: 87.1 fL (ref 80.0–100.0)
Monocytes Absolute: 1.6 10*3/uL — ABNORMAL HIGH (ref 0.1–1.0)
Monocytes Relative: 11 %
Neutro Abs: 12.1 10*3/uL — ABNORMAL HIGH (ref 1.7–7.7)
Neutrophils Relative %: 82 %
Platelets: 206 10*3/uL (ref 150–400)
RBC: 5.04 MIL/uL (ref 3.87–5.11)
RDW: 15.4 % (ref 11.5–15.5)
WBC: 14.7 10*3/uL — ABNORMAL HIGH (ref 4.0–10.5)
nRBC: 0 % (ref 0.0–0.2)

## 2019-08-08 LAB — COMPREHENSIVE METABOLIC PANEL
ALT: 13 U/L (ref 0–44)
AST: 13 U/L — ABNORMAL LOW (ref 15–41)
Albumin: 3.9 g/dL (ref 3.5–5.0)
Alkaline Phosphatase: 55 U/L (ref 38–126)
Anion gap: 12 (ref 5–15)
BUN: 26 mg/dL — ABNORMAL HIGH (ref 8–23)
CO2: 28 mmol/L (ref 22–32)
Calcium: 9.2 mg/dL (ref 8.9–10.3)
Chloride: 96 mmol/L — ABNORMAL LOW (ref 98–111)
Creatinine, Ser: 0.93 mg/dL (ref 0.44–1.00)
GFR calc Af Amer: >60 mL/min (ref >60)
GFR calc non Af Amer: 58 mL/min — ABNORMAL LOW (ref >60)
Glucose, Bld: 191 mg/dL — ABNORMAL HIGH (ref 70–99)
Potassium: 3.3 mmol/L — ABNORMAL LOW (ref 3.5–5.1)
Sodium: 136 mmol/L (ref 135–145)
Total Bilirubin: 1.5 mg/dL — ABNORMAL HIGH (ref 0.3–1.2)
Total Protein: 7.6 g/dL (ref 6.5–8.1)

## 2019-08-08 LAB — LACTIC ACID, PLASMA
Lactic Acid, Venous: 1.6 mmol/L (ref 0.5–1.9)
Lactic Acid, Venous: 1.3 mmol/L (ref 0.5–1.9)

## 2019-08-08 MED ORDER — SODIUM CHLORIDE 0.9 % IV BOLUS
1000.0000 mL | Freq: Once | INTRAVENOUS | Status: AC
  Administered 2019-08-08: 1000 mL via INTRAVENOUS

## 2019-08-08 MED ORDER — ACETAMINOPHEN 325 MG PO TABS
ORAL_TABLET | ORAL | Status: AC
  Administered 2019-08-08: 21:00:00
  Filled 2019-08-08: qty 2

## 2019-08-08 MED ORDER — SODIUM CHLORIDE 0.9 % IV SOLN
1.0000 g | INTRAVENOUS | Status: DC
  Administered 2019-08-08: 1 g via INTRAVENOUS
  Filled 2019-08-08 (×2): qty 10

## 2019-08-08 NOTE — H&P (Signed)
[place] [facility] [place] NAME: Sierra [name]    MR#:  [id number]  DATE OF BIRTH:  Aug 13, 1936  DATE OF ADMISSION:  08/08/2019  PRIMARY CARE PHYSICIAN: Donnamarie Rossetti, PA-C   REQUESTING/REFERRING PHYSICIAN: Charna Archer, MD  CHIEF COMPLAINT:   Chief Complaint  Patient presents with  . Altered Mental Status    HISTORY OF PRESENT ILLNESS:  Sierra [name]  is a 83 y.o. female who presents with chief complaint as above.  Patient presents the ED with a complaint of 1 day of increased urinary frequency with incomplete voiding.  She states she has never had a UTI before.  She states she also had some strange feeling of unsteadiness and intermittent confusion.  Here in the ED she is found to meet sepsis criteria with fever and leukocytosis.  She has nitrite positive UA.  Hospitalist were called for admission  PAST MEDICAL HISTORY:   Past Medical History:  Diagnosis Date  . Anginal pain ([place])   . Anxiety   . Diabetes mellitus without complication ([place])   . Hypertension   . Hypothyroidism   . Protein C deficiency ([place])   . Protein S deficiency ([place])      PAST SURGICAL HISTORY:   Past Surgical History:  Procedure Laterality Date  . BACK SURGERY    . BREAST EXCISIONAL BIOPSY Left 2003?   benign  . COLONOSCOPY N/A 09/12/2017   Procedure: COLONOSCOPY;  Surgeon: Lollie Sails, MD;  Location: [facility] ENDOSCOPY;  Service: Endoscopy;  Laterality: N/A;  . COLONOSCOPY WITH PROPOFOL N/A 01/20/2016   Procedure: COLONOSCOPY WITH PROPOFOL;  Surgeon: Lollie Sails, MD;  Location: [facility] ENDOSCOPY;  Service: Endoscopy;  Laterality: N/A;  . FRACTURE SURGERY    . HIP ARTHROPLASTY Left 01/12/2019   Procedure: ARTHROPLASTY BIPOLAR HIP (HEMIARTHROPLASTY), LEFT;  Surgeon: Leim Fabry, MD;  Location: [facility] ORS;  Service: Orthopedics;  Laterality: Left;  . PERIPHERAL VASCULAR CATHETERIZATION N/A 05/13/2015   Procedure: IVC Filter Removal;  Surgeon: Katha Cabal, MD;  Location: [place] CV LAB;  Service: Cardiovascular;  Laterality: N/A;  . TONSILLECTOMY       SOCIAL HISTORY:   Social History   Tobacco Use  . Smoking status: Former Research scientist (life sciences)  . Smokeless tobacco: Never Used  Substance Use Topics  . Alcohol use: No     FAMILY HISTORY:   Family History  Problem Relation Age of Onset  . Breast cancer Sister      DRUG ALLERGIES:   Allergies  Allergen Reactions  . Gentamicin Other (See Comments)    Kidney function  . Morphine And Related Other (See Comments)    AMS - agitation and delusions **No legal documents to be signed if taking, per family**    MEDICATIONS AT HOME:   Prior to Admission medications   Medication Sig Start Date End Date Taking? Authorizing Provider  alendronate (FOSAMAX) 70 MG tablet Take 70 mg by mouth once a week. 11/20/18   [provider]  amitriptyline (ELAVIL) 10 MG tablet Take 10 mg by mouth at bedtime as needed for sleep. 12/08/18   [provider]  APRISO 0.375 g 24 hr capsule Take 1.5 g by mouth daily. 11/26/18   [provider]  atenolol (TENORMIN) 25 MG tablet Take 25 mg by mouth daily.    [provider]  atorvastatin (LIPITOR) 40 MG tablet Take 40 mg by mouth daily.    [provider]  calcium carbonate (OSCAL) 1500 (600 Ca)  MG TABS tablet Take 600 mg of elemental calcium by mouth daily.     [provider]  cholecalciferol (VITAMIN D3) 25 MCG (1000 UT) tablet Take 3,000 Units by mouth daily. (taken with calcium)    [provider]  DULoxetine (CYMBALTA) 60 MG capsule Take 60 mg by mouth at bedtime.     [provider]  enoxaparin (LOVENOX) 40 MG/0.4ML injection Inject 0.4 mLs (40 mg total) into the skin daily. 01/16/19   Hillary Bow, MD  glipiZIDE (GLUCOTROL XL) 2.5 MG 24 hr tablet Take 2.5 mg by mouth daily. 11/11/18   [provider]  levothyroxine (SYNTHROID, LEVOTHROID) 88 MCG tablet Take 88 mcg by mouth  daily before breakfast.    [provider]  lisinopril (PRINIVIL,ZESTRIL) 40 MG tablet Take 40 mg by mouth daily. 12/05/18   [provider]  loperamide (IMODIUM A-D) 2 MG tablet Take 2 mg by mouth daily.    [provider]  Probiotic Product ([place]) CAPS Take 1 capsule by mouth daily.    [provider]  saccharomyces boulardii (FLORASTOR) 250 MG capsule Take 250 mg by mouth daily.    [provider]  sitaGLIPtin-metformin (JANUMET) 50-500 MG per tablet Take 2 tablets by mouth every evening.     [provider]  warfarin (COUMADIN) 1 MG tablet Take 1 mg by mouth at bedtime.    [provider]  warfarin (COUMADIN) 5 MG tablet Take 5 mg by mouth daily.    [provider]    REVIEW OF SYSTEMS:  Review of Systems  Constitutional: Positive for fever. Negative for chills, malaise/fatigue and weight loss.  HENT: Negative for ear pain, hearing loss and tinnitus.   Eyes: Negative for blurred vision, double vision, pain and redness.  Respiratory: Negative for cough, hemoptysis and shortness of breath.   Cardiovascular: Negative for chest pain, palpitations, orthopnea and leg swelling.  Gastrointestinal: Negative for abdominal pain, constipation, diarrhea, nausea and vomiting.  Genitourinary: Positive for frequency. Negative for dysuria and hematuria.  Musculoskeletal: Negative for back pain, joint pain and neck pain.  Skin:       No acne, rash, or lesions  Neurological: Positive for weakness. Negative for dizziness, tremors and focal weakness.  Endo/Heme/Allergies: Negative for polydipsia. Does not bruise/bleed easily.  Psychiatric/Behavioral: Negative for depression. The patient is not nervous/anxious and does not have insomnia.      VITAL SIGNS:   Vitals:   08/08/19 2024 08/08/19 2030 08/08/19 2155 08/08/19 2200  BP:  129/72  140/68  Pulse:  77  70  Resp:  (!) 22  18  Temp:   (!) 100.4 F (38 C)    TempSrc:   Oral   SpO2:  93%  91%  Weight: 88.5 kg     Height: 5\' 7"  (1.702 m)      Wt Readings from Last 3 Encounters:  08/08/19 88.5 kg  01/11/19 88.9 kg  10/01/18 88.9 kg    PHYSICAL EXAMINATION:  Physical Exam  Vitals reviewed. Constitutional: She is oriented to person, place, and time. She appears well-developed and well-nourished. No distress.  HENT:  Head: Normocephalic and atraumatic.  Mouth/Throat: Oropharynx is clear and moist.  Eyes: Pupils are equal, round, and reactive to light. Conjunctivae and EOM are normal. No scleral icterus.  Neck: Normal range of motion. Neck supple. No JVD present. No thyromegaly present.  Cardiovascular: Normal rate, regular rhythm and intact distal pulses. Exam reveals no gallop and no friction rub.  No murmur  heard. Respiratory: Effort normal and breath sounds normal. No respiratory distress. She has no wheezes. She has no rales.  GI: Soft. Bowel sounds are normal. She exhibits no distension. There is no abdominal tenderness.  Musculoskeletal: Normal range of motion.        General: No edema.     Comments: No arthritis, no gout  Lymphadenopathy:    She has no cervical adenopathy.  Neurological: She is alert and oriented to person, place, and time. No cranial nerve deficit.  No dysarthria, no aphasia  Skin: Skin is warm and dry. No rash noted. No erythema.  Psychiatric: She has a normal mood and affect. Her behavior is normal. Judgment and thought content normal.    LABORATORY PANEL:   CBC Recent Labs  Lab 08/08/19 2037  WBC 14.7*  HGB 14.2  HCT 43.9  PLT 206   ------------------------------------------------------------------------------------------------------------------  Chemistries  Recent Labs  Lab 08/08/19 2037  NA 136  K 3.3*  CL 96*  CO2 28  GLUCOSE 191*  BUN 26*  CREATININE 0.93  CALCIUM 9.2  AST 13*  ALT 13  ALKPHOS 55  BILITOT 1.5*    ------------------------------------------------------------------------------------------------------------------  Cardiac Enzymes No results for input(s): TROPONINI in the last 168 hours. ------------------------------------------------------------------------------------------------------------------  RADIOLOGY:  Ct Head Wo Contrast  Result Date: 08/08/2019 CLINICAL DATA:  Altered level of consciousness (LOC), unexplained. Confusion and impaired gait. EXAM: CT HEAD WITHOUT CONTRAST TECHNIQUE: Contiguous axial images were obtained from the base of the skull through the vertex without intravenous contrast. COMPARISON:  Head CT 01/11/2019 FINDINGS: Brain: No intracranial hemorrhage, mass effect, or midline shift. Brain volume is normal for age. Mild chronic small vessel ischemia, unchanged. No hydrocephalus. The basilar cisterns are patent. No evidence of territorial infarct or acute ischemia. No extra-axial or intracranial fluid collection. Vascular: No hyperdense vessel. Skull: No fracture or focal lesion. Sinuses/Orbits: Paranasal sinuses and mastoid air cells are clear. The visualized orbits are unremarkable. Other: None. IMPRESSION: 1. No acute intracranial abnormality. 2. Stable degree of atrophy and chronic small vessel ischemia. Electronically Signed   By: Keith Rake M.D.   On: 08/08/2019 21:50   Dg Chest Portable 1 View  Result Date: 08/08/2019 CLINICAL DATA:  83 year old female with altered mental status. EXAM: PORTABLE CHEST 1 VIEW COMPARISON:  Chest radiograph dated 01/11/2019 FINDINGS: Mild diffuse interstitial coarsening. No focal consolidation, pleural effusion, pneumothorax. There is eventration of the right hemidiaphragm. Stable mild cardiomegaly. No acute osseous pathology. IMPRESSION: 1. No acute cardiopulmonary process. 2. Stable mild cardiomegaly. Electronically Signed   By: Anner Crete M.D.   On: 08/08/2019 21:36    EKG:   Orders placed or performed during the  hospital encounter of 08/08/19  . EKG 12-Lead  . EKG 12-Lead  . ED EKG 12-Lead  . ED EKG 12-Lead  . EKG 12-Lead  . EKG 12-Lead    IMPRESSION AND PLAN:  Principal Problem:   Sepsis ([place]) -sepsis is due to UTI, lactic acid within normal limits, blood pressure stable, antibiotics given, cultures sent Active Problems:   UTI (urinary tract infection) -antibiotics and culture   HTN (hypertension) -continue home dose antihypertensives   Diabetes ([place]) -sliding scale insulin coverage   Protein C deficiency ([place]) -continue home dose anticoagulation   Protein S deficiency ([place]) -see above   Hypothyroidism -home dose thyroid replacement  Chart review performed and case discussed with ED provider. Labs, imaging and/or ECG reviewed by provider and discussed with patient/family. Management plans discussed with the patient and/or family.  COVID-19  status: Pending   DVT PROPHYLAXIS: Systemic anticoagulation  GI PROPHYLAXIS:  None  ADMISSION STATUS: Inpatient     CODE STATUS: Full Code Status History    Date Active Date Inactive Code Status Order ID Comments User Context   01/11/2019 1553 01/15/2019 2038 Full Code [id number]  Gorden Harms, MD ED   05/13/2015 1528 05/13/2015 1910 Full Code [id number]  Schnier, Dolores Lory, MD Inpatient   Advance Care Planning Activity      TOTAL TIME TAKING CARE OF THIS PATIENT: 45 minutes.   This patient was evaluated in the context of the global COVID-19 pandemic, which necessitated consideration that the patient might be at risk for infection with the SARS-CoV-2 virus that causes COVID-19. Institutional protocols and algorithms that pertain to the evaluation of patients at risk for COVID-19 are in a state of rapid change based on information released by regulatory bodies including the CDC and federal and state organizations. These policies and algorithms were followed to the best of this provider's knowledge to date during the patient's care at this  facility.  Ethlyn Daniels 08/08/2019, 10:34 PM  [facility]  Office  [phone]  CC: Primary care physician; Whitaker, Rolanda Jay, PA-C  Note:  This document was prepared using Dragon voice recognition software and may include unintentional dictation errors.

## 2019-08-08 NOTE — ED Notes (Signed)
Patient is resting comfortably. 

## 2019-08-08 NOTE — Progress Notes (Signed)
CODE SEPSIS - PHARMACY COMMUNICATION  **Broad Spectrum Antibiotics should be administered within 1 hour of Sepsis diagnosis**  Time Code Sepsis Called/Page Received: 2144  Antibiotics Ordered: Rocephin 1gm  Time of 1st antibiotic administration: 2205  Additional action taken by pharmacy: n/a  If necessary, Name of Provider/Nurse Contacted: n/a   Ena Dawley ,PharmD Clinical Pharmacist  08/08/2019  9:47 PM

## 2019-08-08 NOTE — ED Triage Notes (Signed)
Pt to the er for confusion and impaired gait. Pt states she has had increased urination since yesterday and bouts of confusion since last night. Pt says she feels fumbly.

## 2019-08-08 NOTE — ED Provider Notes (Addendum)
[facility] Emergency Department Provider Note   ____________________________________________   First MD Initiated Contact with Patient 08/08/19 2023     (approximate)  I have reviewed the triage vital signs and the nursing notes.   HISTORY  Chief Complaint Altered Mental Status    HPI Sierra [name] is a 83 y.o. female with past medical history of hypertension, diabetes who presents to the ED for altered mental status.  Patient states that she has had intermittent confusion over the past couple of days and that she is often "fumbling and stumbling".  She states she does not feel weak anywhere particular, but has been very unsteady on her feet.  Her husband found her standing and staring off yesterday, not making sense when she spoke.  This seemed to resolve yesterday evening, but she had similar episodes again today.  She states she has been urinating more frequently than usual, but denies any dysuria, hematuria, or flank pain.  She has not noticed any fevers, cough, chest pain, or shortness of breath.  She also denies any abdominal pain, nausea, or vomiting.        Past Medical History:  Diagnosis Date  . Anginal pain ([place])   . Anxiety   . Diabetes mellitus without complication ([place])   . Hypertension   . Hypothyroidism   . Protein C deficiency ([place])   . Protein S deficiency [facility])     Patient Active Problem List   Diagnosis Date Noted  . Sepsis ([place]) 08/08/2019  . UTI (urinary tract infection) 08/08/2019  . Hypothyroidism 08/08/2019  . HTN (hypertension) 08/08/2019  . Diabetes ([place]) 08/08/2019  . Protein C deficiency ([place]) 08/08/2019  . Protein S deficiency ([place]) 08/08/2019  . Hip fracture ([place]) 01/11/2019    Past Surgical History:  Procedure Laterality Date  . BACK SURGERY    . BREAST EXCISIONAL BIOPSY Left 2003?   benign  . COLONOSCOPY N/A 09/12/2017   Procedure: COLONOSCOPY;  Surgeon: Lollie Sails, MD;  Location: [facility] ENDOSCOPY;   Service: Endoscopy;  Laterality: N/A;  . COLONOSCOPY WITH PROPOFOL N/A 01/20/2016   Procedure: COLONOSCOPY WITH PROPOFOL;  Surgeon: Lollie Sails, MD;  Location: [facility] ENDOSCOPY;  Service: Endoscopy;  Laterality: N/A;  . FRACTURE SURGERY    . HIP ARTHROPLASTY Left 01/12/2019   Procedure: ARTHROPLASTY BIPOLAR HIP (HEMIARTHROPLASTY), LEFT;  Surgeon: Leim Fabry, MD;  Location: [facility] ORS;  Service: Orthopedics;  Laterality: Left;  . PERIPHERAL VASCULAR CATHETERIZATION N/A 05/13/2015   Procedure: IVC Filter Removal;  Surgeon: Katha Cabal, MD;  Location: [place] CV LAB;  Service: Cardiovascular;  Laterality: N/A;  . TONSILLECTOMY      Prior to Admission medications   Medication Sig Start Date End Date Taking? Authorizing Provider  alendronate (FOSAMAX) 70 MG tablet Take 70 mg by mouth once a week. 11/20/18  Yes [provider]  amLODipine (NORVASC) 2.5 MG tablet Take 2.5 mg by mouth daily.   Yes [provider]  APRISO 0.375 g 24 hr capsule Take 1.5 g by mouth daily. 11/26/18  Yes [provider]  atenolol (TENORMIN) 25 MG tablet Take 25 mg by mouth daily.   Yes [provider]  atorvastatin (LIPITOR) 40 MG tablet Take 40 mg by mouth daily.   Yes [provider]  calcium carbonate (OSCAL) 1500 (600 Ca) MG TABS tablet Take 600 mg of elemental calcium by mouth daily.    Yes [provider]  cholecalciferol (VITAMIN D3) 25 MCG (1000 UT) tablet Take 3,000  Units by mouth daily. (taken with calcium)   Yes [provider]  DULoxetine (CYMBALTA) 60 MG capsule Take 60 mg by mouth at bedtime.    Yes [provider]  glipiZIDE (GLUCOTROL XL) 2.5 MG 24 hr tablet Take 2.5 mg by mouth daily. 11/11/18  Yes [provider]  levothyroxine (SYNTHROID, LEVOTHROID) 88 MCG tablet Take 88 mcg by mouth daily before breakfast.   Yes [provider]  lisinopril (PRINIVIL,ZESTRIL) 40 MG tablet Take 40 mg by mouth daily. 12/05/18   Yes [provider]  loperamide (IMODIUM A-D) 2 MG tablet Take 2 mg by mouth daily.   Yes [provider]  Probiotic Product ([place]) CAPS Take 1 capsule by mouth daily.   Yes [provider]  saccharomyces boulardii (FLORASTOR) 250 MG capsule Take 250 mg by mouth daily.   Yes [provider]  sitaGLIPtin-metformin (JANUMET) 50-500 MG per tablet Take 2 tablets by mouth every evening.    Yes [provider]  warfarin (COUMADIN) 4 MG tablet Take 4 mg by mouth as directed. Takes on Sat and Sun   Yes [provider]  warfarin (COUMADIN) 5 MG tablet Take 5 mg by mouth daily.   Yes [provider]  amitriptyline (ELAVIL) 10 MG tablet Take 10 mg by mouth at bedtime as needed for sleep. 12/08/18   [provider]  enoxaparin (LOVENOX) 40 MG/0.4ML injection Inject 0.4 mLs (40 mg total) into the skin daily. Patient not taking: Reported on 08/09/2019 01/16/19   Hillary Bow, MD    Allergies Gentamicin and Morphine and related  Family History  Problem Relation Age of Onset  . Breast cancer Sister     Social History Social History   Tobacco Use  . Smoking status: Former Research scientist (life sciences)  . Smokeless tobacco: Never Used  Substance Use Topics  . Alcohol use: No  . Drug use: No    Review of Systems  Constitutional: No fever/chills Eyes: No visual changes. ENT: No sore throat. Cardiovascular: Denies chest pain. Respiratory: Denies shortness of breath. Gastrointestinal: No abdominal pain.  No nausea, no vomiting.  No diarrhea.  No constipation. Genitourinary: Negative for dysuria.  Positive for urinary frequency. Musculoskeletal: Negative for back pain. Skin: Negative for rash. Neurological: Negative for headaches, focal weakness or numbness.  Positive for confusion.  ____________________________________________   PHYSICAL EXAM:  VITAL SIGNS: ED Triage Vitals  Enc Vitals Group     BP 08/08/19 2023 129/72      Pulse Rate 08/08/19 2023 79     Resp 08/08/19 2023 19     Temp 08/08/19 2023 (!) 100.8 F (38.2 C)     Temp Source 08/08/19 2023 Oral     SpO2 08/08/19 2023 96 %     Weight 08/08/19 2024 195 lb (88.5 kg)     Height 08/08/19 2024 5\' 7"  (1.702 m)     Head Circumference --      Peak Flow --      Pain Score 08/08/19 2024 0     Pain Loc --      Pain Edu? --      Excl. in [place]? --     Constitutional: Alert and oriented to person, place, and time. Eyes: Conjunctivae are normal. Head: Atraumatic. Nose: No congestion/rhinnorhea. Mouth/Throat: Mucous membranes are moist. Neck: Normal ROM Cardiovascular: Normal rate, regular rhythm. Grossly normal heart sounds. Respiratory: Normal respiratory effort.  No retractions. Lungs CTAB. Gastrointestinal: Soft and nontender. No distention. Genitourinary: deferred Musculoskeletal: No lower  extremity tenderness nor edema. Neurologic:  Normal speech and language. No gross focal neurologic deficits are appreciated. Skin:  Skin is warm, dry and intact. No rash noted. Psychiatric: Mood and affect are normal. Speech and behavior are normal.  ____________________________________________   LABS (all labs ordered are listed, but only abnormal results are displayed)  Labs Reviewed  CBC WITH DIFFERENTIAL/PLATELET - Abnormal; Notable for the following components:      Result Value   WBC 14.7 (*)    Neutro Abs 12.1 (*)    Monocytes Absolute 1.6 (*)    Abs Immature Granulocytes 0.09 (*)    All other components within normal limits  COMPREHENSIVE METABOLIC PANEL - Abnormal; Notable for the following components:   Potassium 3.3 (*)    Chloride 96 (*)    Glucose, Bld 191 (*)    BUN 26 (*)    AST 13 (*)    Total Bilirubin 1.5 (*)    GFR calc non Af Amer 58 (*)    All other components within normal limits  URINALYSIS, COMPLETE (UACMP) WITH MICROSCOPIC - Abnormal; Notable for the following components:   Color, Urine YELLOW (*)    APPearance HAZY (*)     Hgb urine dipstick SMALL (*)    Protein, ur 30 (*)    Nitrite POSITIVE (*)    Leukocytes,Ua SMALL (*)    Bacteria, UA MANY (*)    All other components within normal limits  GLUCOSE, CAPILLARY - Abnormal; Notable for the following components:   Glucose-Capillary 159 (*)    All other components within normal limits  PROTIME-INR - Abnormal; Notable for the following components:   Prothrombin Time 19.1 (*)    INR 1.6 (*)    All other components within normal limits  CULTURE, BLOOD (ROUTINE X 2)  CULTURE, BLOOD (ROUTINE X 2)  URINE CULTURE  SARS CORONAVIRUS 2 (TAT 6-24 HRS)  LACTIC ACID, PLASMA  LACTIC ACID, PLASMA  HEMOGLOBIN D6Q  BASIC METABOLIC PANEL  CBC   ____________________________________________  EKG  ED ECG REPORT I, Blake Divine, the attending physician, personally viewed and interpreted this ECG.   Date: 08/08/2019  EKG Time: 20:31  Rate: 80  Rhythm: normal sinus rhythm  Axis: LAD  Intervals:first-degree A-V block  and right bundle branch block  ST&T Change: T wave inversions anteriorly are old, new TWI laterally    PROCEDURES  Procedure(s) performed (including Critical Care):  .Critical Care Performed by: Blake Divine, MD Authorized by: Blake Divine, MD   Critical care provider statement:    Critical care time (minutes):  45   Critical care time was exclusive of:  Separately billable procedures and treating other patients and teaching time   Critical care was necessary to treat or prevent imminent or life-threatening deterioration of the following conditions:  Sepsis   Critical care was time spent personally by me on the following activities:  Discussions with consultants, evaluation of patient's response to treatment, examination of patient, ordering and performing treatments and interventions, ordering and review of laboratory studies, ordering and review of radiographic studies, pulse oximetry, re-evaluation of patient's condition, obtaining  history from patient or surrogate and review of old charts   I assumed direction of critical care for this patient from another provider in my specialty: no       ____________________________________________   INITIAL IMPRESSION / ASSESSMENT AND PLAN / ED COURSE       83 year old female presenting to the ED for confusion with unsteadiness in her gait over the  past couple of days.  She did not notice a temperature at home, but was febrile here to 100.8.  Septic work-up was initiated with blood cultures and lactate, patient started on IV fluids.  Most likely cause appears to be urinary given patient's reported urinary frequency.  She has a nonfocal neurologic exam and lower suspicion for stroke.  Will screen CT head.  CT head negative for acute process.  Labs concerning for sepsis given leukocytosis with UTI as the apparent source.  Will treat with Rocephin and case discussed with hospitalist, who accepts patient for admission.      ____________________________________________   FINAL CLINICAL IMPRESSION(S) / ED DIAGNOSES  Final diagnoses:  Sepsis without acute organ dysfunction, due to unspecified organism [facility])  Confusion  Lower urinary tract infectious disease     ED Discharge Orders    None       Note:  This document was prepared using Dragon voice recognition software and may include unintentional dictation errors.   Blake Divine, MD 08/09/19 Aretha Parrot    Blake Divine, MD 08/29/19 1229

## 2019-08-09 ENCOUNTER — Other Ambulatory Visit: Payer: Self-pay

## 2019-08-09 LAB — BLOOD CULTURE ID PANEL (REFLEXED)

## 2019-08-09 LAB — PROTIME-INR
INR: 1.6 — ABNORMAL HIGH (ref 0.8–1.2)
Prothrombin Time: 19.1 seconds — ABNORMAL HIGH (ref 11.4–15.2)

## 2019-08-09 LAB — BASIC METABOLIC PANEL
Anion gap: 7 (ref 5–15)
BUN: 23 mg/dL (ref 8–23)
CO2: 29 mmol/L (ref 22–32)
Calcium: 8.6 mg/dL — ABNORMAL LOW (ref 8.9–10.3)
Chloride: 105 mmol/L (ref 98–111)
Creatinine, Ser: 0.95 mg/dL (ref 0.44–1.00)
GFR calc Af Amer: >60 mL/min (ref >60)
GFR calc non Af Amer: 56 mL/min — ABNORMAL LOW (ref >60)
Glucose, Bld: 166 mg/dL — ABNORMAL HIGH (ref 70–99)
Potassium: 3.7 mmol/L (ref 3.5–5.1)
Sodium: 141 mmol/L (ref 135–145)

## 2019-08-09 LAB — CBC
HCT: 39.3 % (ref 36.0–46.0)
Hemoglobin: 12.9 g/dL (ref 12.0–15.0)
MCH: 28.9 pg (ref 26.0–34.0)
MCHC: 32.8 g/dL (ref 30.0–36.0)
MCV: 87.9 fL (ref 80.0–100.0)
Platelets: 164 10*3/uL (ref 150–400)
RBC: 4.47 MIL/uL (ref 3.87–5.11)
RDW: 15.1 % (ref 11.5–15.5)
WBC: 10.7 10*3/uL — ABNORMAL HIGH (ref 4.0–10.5)
nRBC: 0 % (ref 0.0–0.2)

## 2019-08-09 LAB — HEMOGLOBIN A1C
Hgb A1c MFr Bld: 6.7 % — ABNORMAL HIGH (ref 4.8–5.6)
Mean Plasma Glucose: 145.59 mg/dL

## 2019-08-09 LAB — SARS CORONAVIRUS 2 (TAT 6-24 HRS): SARS Coronavirus 2: NEGATIVE

## 2019-08-09 LAB — GLUCOSE, CAPILLARY
Glucose-Capillary: 127 mg/dL — ABNORMAL HIGH (ref 70–99)
Glucose-Capillary: 151 mg/dL — ABNORMAL HIGH (ref 70–99)
Glucose-Capillary: 159 mg/dL — ABNORMAL HIGH (ref 70–99)
Glucose-Capillary: 163 mg/dL — ABNORMAL HIGH (ref 70–99)
Glucose-Capillary: 164 mg/dL — ABNORMAL HIGH (ref 70–99)

## 2019-08-09 MED ORDER — ATORVASTATIN CALCIUM 20 MG PO TABS
40.0000 mg | ORAL_TABLET | Freq: Every day | ORAL | Status: DC
  Administered 2019-08-09 – 2019-08-10 (×2): 40 mg via ORAL
  Filled 2019-08-09 (×2): qty 2

## 2019-08-09 MED ORDER — SODIUM CHLORIDE 0.9 % IV SOLN
2.0000 g | INTRAVENOUS | Status: DC
  Administered 2019-08-09 – 2019-08-10 (×2): 2 g via INTRAVENOUS
  Filled 2019-08-09 (×3): qty 20

## 2019-08-09 MED ORDER — ONDANSETRON HCL 4 MG/2ML IJ SOLN: 4.0000 mg | Freq: Four times a day (QID) | INTRAMUSCULAR | Status: DC | PRN

## 2019-08-09 MED ORDER — INSULIN ASPART 100 UNIT/ML ~~LOC~~ SOLN
0.0000 [IU] | Freq: Three times a day (TID) | SUBCUTANEOUS | Status: DC
  Administered 2019-08-09 (×2): 2 [IU] via SUBCUTANEOUS
  Administered 2019-08-10: 1 [IU] via SUBCUTANEOUS
  Administered 2019-08-10 – 2019-08-11 (×3): 2 [IU] via SUBCUTANEOUS
  Filled 2019-08-09 (×6): qty 1

## 2019-08-09 MED ORDER — LEVOTHYROXINE SODIUM 88 MCG PO TABS
88.0000 ug | ORAL_TABLET | Freq: Every day | ORAL | Status: DC
  Administered 2019-08-09 – 2019-08-11 (×3): 88 ug via ORAL
  Filled 2019-08-09 (×3): qty 1

## 2019-08-09 MED ORDER — INFLUENZA VAC A&B SA ADJ QUAD 0.5 ML IM PRSY
0.5000 mL | PREFILLED_SYRINGE | INTRAMUSCULAR | Status: AC
  Administered 2019-08-10: 0.5 mL via INTRAMUSCULAR
  Filled 2019-08-09: qty 0.5

## 2019-08-09 MED ORDER — WARFARIN - PHARMACIST DOSING INPATIENT
Freq: Every day | Status: DC
  Administered 2019-08-09: 18:00:00

## 2019-08-09 MED ORDER — ACETAMINOPHEN 650 MG RE SUPP: 650.0000 mg | Freq: Four times a day (QID) | RECTAL | Status: DC | PRN

## 2019-08-09 MED ORDER — WARFARIN SODIUM 2.5 MG PO TABS
2.5000 mg | ORAL_TABLET | Freq: Once | ORAL | Status: AC
  Administered 2019-08-09: 2.5 mg via ORAL
  Filled 2019-08-09: qty 1

## 2019-08-09 MED ORDER — WARFARIN SODIUM 5 MG PO TABS
5.0000 mg | ORAL_TABLET | ORAL | Status: AC
  Administered 2019-08-09: 5 mg via ORAL
  Filled 2019-08-09: qty 1

## 2019-08-09 MED ORDER — INSULIN ASPART 100 UNIT/ML ~~LOC~~ SOLN: 0.0000 [IU] | Freq: Every day | SUBCUTANEOUS | Status: DC

## 2019-08-09 MED ORDER — ONDANSETRON HCL 4 MG PO TABS: 4.0000 mg | ORAL_TABLET | Freq: Four times a day (QID) | ORAL | Status: DC | PRN

## 2019-08-09 MED ORDER — ACETAMINOPHEN 325 MG PO TABS: 650.0000 mg | ORAL_TABLET | Freq: Four times a day (QID) | ORAL | Status: DC | PRN

## 2019-08-09 NOTE — ED Notes (Signed)
ED TO INPATIENT HANDOFF REPORT  ED Nurse Name and Phone #:  Dorothyann Gibbs [phone]  S Name/Age/Gender Sierra [name] 83 y.o. female Room/Bed: ED09A/ED09A  Code Status   Code Status: Prior  Home/SNF/Other Home Patient oriented to: self, place, time and situation Is this baseline? Yes   Triage Complete: Triage complete  Chief Complaint Weakness  Triage Note Pt to the er for confusion and impaired gait. Pt states she has had increased urination since yesterday and bouts of confusion since last night. Pt says she feels fumbly.   Allergies Allergies  Allergen Reactions  . Gentamicin Other (See Comments)    Kidney function  . Morphine And Related Other (See Comments)    AMS - agitation and delusions **No legal documents to be signed if taking, per family**    Level of Care/Admitting Diagnosis ED Disposition    ED Disposition Condition [place]: [place] [[id number]]  Level of Care: Med-Surg [16]  Covid Evaluation: Asymptomatic Screening Protocol (No Symptoms)  Diagnosis: Sepsis [facility]) [[id number]]  Admitting Physician: Lance Coon [[id number]]  Attending Physician: Lance Coon [phone]  Estimated length of stay: past midnight tomorrow  Certification:: I certify this patient will need inpatient services for at least 2 midnights  PT Class (Do Not Modify): Inpatient [101]  PT Acc Code (Do Not Modify): Private [1]       B Medical/Surgery History Past Medical History:  Diagnosis Date  . Anginal pain ([place])   . Anxiety   . Diabetes mellitus without complication ([place])   . Hypertension   . Hypothyroidism   . Protein C deficiency ([place])   . Protein S deficiency [facility])    Past Surgical History:  Procedure Laterality Date  . BACK SURGERY    . BREAST EXCISIONAL BIOPSY Left 2003?   benign  . COLONOSCOPY N/A 09/12/2017   Procedure: COLONOSCOPY;  Surgeon: Lollie Sails, MD;  Location: [facility] ENDOSCOPY;  Service: Endoscopy;  Laterality: N/A;   . COLONOSCOPY WITH PROPOFOL N/A 01/20/2016   Procedure: COLONOSCOPY WITH PROPOFOL;  Surgeon: Lollie Sails, MD;  Location: [facility] ENDOSCOPY;  Service: Endoscopy;  Laterality: N/A;  . FRACTURE SURGERY    . HIP ARTHROPLASTY Left 01/12/2019   Procedure: ARTHROPLASTY BIPOLAR HIP (HEMIARTHROPLASTY), LEFT;  Surgeon: Leim Fabry, MD;  Location: [facility] ORS;  Service: Orthopedics;  Laterality: Left;  . PERIPHERAL VASCULAR CATHETERIZATION N/A 05/13/2015   Procedure: IVC Filter Removal;  Surgeon: Katha Cabal, MD;  Location: [place] CV LAB;  Service: Cardiovascular;  Laterality: N/A;  . TONSILLECTOMY       A IV Location/Drains/Wounds Patient Lines/Drains/Airways Status   Active Line/Drains/Airways    None          Intake/Output Last 24 hours No intake or output data in the 24 hours ending 08/09/19 0004  Labs/Imaging Results for orders placed or performed during the hospital encounter of 08/08/19 (from the past 48 hour(s))  Lactic acid, plasma     Status: None   Collection Time: 08/08/19  8:37 PM  Result Value Ref Range   Lactic Acid, Venous 1.6 0.5 - 1.9 mmol/L    Comment: Performed at [facility], [address] [place], [place] [address]  CBC with Differential     Status: Abnormal   Collection Time: 08/08/19  8:37 PM  Result Value Ref Range   WBC 14.7 (H) 4.0 - 10.5 K/uL   RBC 5.04 3.87 - 5.11 MIL/uL   Hemoglobin 14.2 12.0 - 15.0 g/dL  HCT 43.9 36.0 - 46.0 %   MCV 87.1 80.0 - 100.0 fL   MCH 28.2 26.0 - 34.0 pg   MCHC 32.3 30.0 - 36.0 g/dL   RDW 15.4 11.5 - 15.5 %   Platelets 206 150 - 400 K/uL   nRBC 0.0 0.0 - 0.2 %   Neutrophils Relative % 82 %   Neutro Abs 12.1 (H) 1.7 - 7.7 K/uL   Lymphocytes Relative 6 %   Lymphs Abs 0.9 0.7 - 4.0 K/uL   Monocytes Relative 11 %   Monocytes Absolute 1.6 (H) 0.1 - 1.0 K/uL   Eosinophils Relative 0 %   Eosinophils Absolute 0.0 0.0 - 0.5 K/uL   Basophils Relative 0 %   Basophils Absolute 0.0 0.0 - 0.1 K/uL   Immature  Granulocytes 1 %   Abs Immature Granulocytes 0.09 (H) 0.00 - 0.07 K/uL    Comment: Performed at [facility], [address] [place], [place] [address]  Comprehensive metabolic panel     Status: Abnormal   Collection Time: 08/08/19  8:37 PM  Result Value Ref Range   Sodium 136 135 - 145 mmol/L   Potassium 3.3 (L) 3.5 - 5.1 mmol/L   Chloride 96 (L) 98 - 111 mmol/L   CO2 28 22 - 32 mmol/L   Glucose, Bld 191 (H) 70 - 99 mg/dL   BUN 26 (H) 8 - 23 mg/dL   Creatinine, Ser 0.93 0.44 - 1.00 mg/dL   Calcium 9.2 8.9 - 10.3 mg/dL   Total Protein 7.6 6.5 - 8.1 g/dL   Albumin 3.9 3.5 - 5.0 g/dL   AST 13 (L) 15 - 41 U/L   ALT 13 0 - 44 U/L   Alkaline Phosphatase 55 38 - 126 U/L   Total Bilirubin 1.5 (H) 0.3 - 1.2 mg/dL   GFR calc non Af Amer 58 (L) >60 mL/min   GFR calc Af Amer >60 >60 mL/min   Anion gap 12 5 - 15    Comment: Performed at [facility], [address] [place], [place] [address]  Urinalysis, Complete w Microscopic     Status: Abnormal   Collection Time: 08/08/19  8:37 PM  Result Value Ref Range   Color, Urine YELLOW (A) YELLOW   APPearance HAZY (A) CLEAR   Specific Gravity, Urine 1.024 1.005 - 1.030   pH 6.0 5.0 - 8.0   Glucose, UA NEGATIVE NEGATIVE mg/dL   Hgb urine dipstick SMALL (A) NEGATIVE   Bilirubin Urine NEGATIVE NEGATIVE   Ketones, ur NEGATIVE NEGATIVE mg/dL   Protein, ur 30 (A) NEGATIVE mg/dL   Nitrite POSITIVE (A) NEGATIVE   Leukocytes,Ua SMALL (A) NEGATIVE   RBC / HPF 0-5 0 - 5 RBC/hpf   WBC, UA 21-50 0 - 5 WBC/hpf   Bacteria, UA MANY (A) NONE SEEN   Squamous Epithelial / LPF 0-5 0 - 5   WBC Clumps PRESENT    Mucus PRESENT     Comment: Performed at [facility], [address] [place], [place] [address]  Lactic acid, plasma     Status: None   Collection Time: 08/08/19  9:59 PM  Result Value Ref Range   Lactic Acid, Venous 1.3 0.5 - 1.9 mmol/L    Comment: Performed at [facility], [address]  [place], [place] [address]   Ct Head Wo Contrast  Result Date: 08/08/2019 CLINICAL DATA:  Altered level of consciousness (LOC), unexplained. Confusion and impaired gait. EXAM: CT HEAD WITHOUT CONTRAST TECHNIQUE:  Contiguous axial images were obtained from the base of the skull through the vertex without intravenous contrast. COMPARISON:  Head CT 01/11/2019 FINDINGS: Brain: No intracranial hemorrhage, mass effect, or midline shift. Brain volume is normal for age. Mild chronic small vessel ischemia, unchanged. No hydrocephalus. The basilar cisterns are patent. No evidence of territorial infarct or acute ischemia. No extra-axial or intracranial fluid collection. Vascular: No hyperdense vessel. Skull: No fracture or focal lesion. Sinuses/Orbits: Paranasal sinuses and mastoid air cells are clear. The visualized orbits are unremarkable. Other: None. IMPRESSION: 1. No acute intracranial abnormality. 2. Stable degree of atrophy and chronic small vessel ischemia. Electronically Signed   By: Keith Rake M.D.   On: 08/08/2019 21:50   Dg Chest Portable 1 View  Result Date: 08/08/2019 CLINICAL DATA:  83 year old female with altered mental status. EXAM: PORTABLE CHEST 1 VIEW COMPARISON:  Chest radiograph dated 01/11/2019 FINDINGS: Mild diffuse interstitial coarsening. No focal consolidation, pleural effusion, pneumothorax. There is eventration of the right hemidiaphragm. Stable mild cardiomegaly. No acute osseous pathology. IMPRESSION: 1. No acute cardiopulmonary process. 2. Stable mild cardiomegaly. Electronically Signed   By: Anner Crete M.D.   On: 08/08/2019 21:36    Pending Labs Unresulted Labs (From admission, onward)    Start     Ordered   08/09/19 0500  Protime-INR  Daily,   STAT     08/08/19 2251   08/08/19 2252  Protime-INR  Add-on,   AD     08/08/19 2251   08/08/19 2144  SARS CORONAVIRUS 2 (TAT 6-24 HRS) Nasopharyngeal Nasopharyngeal Swab  (Asymptomatic/Tier 2 Patients Labs)  Once,   STAT     Question Answer Comment  Is this test for diagnosis or screening Screening   Symptomatic for COVID-19 as defined by CDC No   Hospitalized for COVID-19 No   Admitted to ICU for COVID-19 No   Previously tested for COVID-19 No   Resident in a congregate (group) care setting No   Employed in healthcare setting No   Pregnant No      08/08/19 2143   08/08/19 2114  Urine culture  Once,   STAT     08/08/19 2113   08/08/19 2113  Culture, blood (routine x 2)  BLOOD CULTURE X 2,   STAT     08/08/19 2113   Signed and Held  Hemoglobin A1c  Once,   R    Comments: To assess prior glycemic control    Signed and Held   Signed and Held  Basic metabolic panel  Tomorrow morning,   R     Signed and Held   Signed and Held  CBC  Tomorrow morning,   R     Signed and Held          Vitals/Pain Today's Vitals   08/08/19 2300 08/08/19 2330 08/08/19 2330 08/09/19 0000  BP: (!) 151/80 139/67  (!) 142/68  Pulse: 65 65    Resp: 14 14  13   Temp:   99.1 F (37.3 C)   TempSrc:   Oral   SpO2: 95% 91%    Weight:      Height:      PainSc:        Isolation Precautions No active isolations  Medications Medications  cefTRIAXone (ROCEPHIN) 1 g in sodium chloride 0.9 % 100 mL IVPB (0 g Intravenous Stopped 08/08/19 2223)  acetaminophen (TYLENOL) 325 MG tablet (  Given 08/08/19 2055)  sodium chloride 0.9 % bolus 1,000 mL (1,000 mLs Intravenous New Bag/Given  08/08/19 2205)    Mobility walks with device Low fall risk   Focused Assessments n/a   R Recommendations: See Admitting Provider Note  Report given to:   Additional Notes: n/a

## 2019-08-09 NOTE — Progress Notes (Signed)
ANTICOAGULATION CONSULT NOTE - Initial Consult  Pharmacy Consult for Warfarin Indication: VTE prophylaxis - Protein C and Protein S deficiency  Allergies  Allergen Reactions  . Gentamicin Other (See Comments)    Kidney function  . Morphine And Related Other (See Comments)    AMS - agitation and delusions **No legal documents to be signed if taking, per family**    Patient Measurements: Height: 5\' 7"  (170.2 cm) Weight: 195 lb (88.5 kg) IBW/kg (Calculated) : 61.6  Vital Signs: Temp: 97.9 F (36.6 C) (10/01 0044) Temp Source: Oral (10/01 0044) BP: 141/95 (10/01 0044) Pulse Rate: 69 (10/01 0044)  Labs: Recent Labs    08/08/19 2037 08/09/19 0109  HGB 14.2  --   HCT 43.9  --   PLT 206  --   LABPROT  --  19.1*  INR  --  1.6*  CREATININE 0.93  --     Estimated Creatinine Clearance: 54.2 mL/min (by C-G formula based on SCr of 0.93 mg/dL).   Medical History: Past Medical History:  Diagnosis Date  . Anginal pain ([place])   . Anxiety   . Diabetes mellitus without complication ([place])   . Hypertension   . Hypothyroidism   . Protein C deficiency ([place])   . Protein S deficiency ([place])     Medications:  Medications Prior to Admission  Medication Sig Dispense Refill Last Dose  . alendronate (FOSAMAX) 70 MG tablet Take 70 mg by mouth once a week.   unknown at unknown  . amLODipine (NORVASC) 2.5 MG tablet Take 2.5 mg by mouth daily.   unknown at unknown  . APRISO 0.375 g 24 hr capsule Take 1.5 g by mouth daily.   unknown at unknown  . atenolol (TENORMIN) 25 MG tablet Take 25 mg by mouth daily.   unknown at unknown  . atorvastatin (LIPITOR) 40 MG tablet Take 40 mg by mouth daily.   unknown at unknown  . calcium carbonate (OSCAL) 1500 (600 Ca) MG TABS tablet Take 600 mg of elemental calcium by mouth daily.    unknown at [facility]  . cholecalciferol (VITAMIN D3) 25 MCG (1000 UT) tablet Take 3,000 Units by mouth daily. (taken with calcium)   unknown at unknown  . DULoxetine (CYMBALTA)  60 MG capsule Take 60 mg by mouth at bedtime.    unknown at unknown  . glipiZIDE (GLUCOTROL XL) 2.5 MG 24 hr tablet Take 2.5 mg by mouth daily.   unknown at unknown  . levothyroxine (SYNTHROID, LEVOTHROID) 88 MCG tablet Take 88 mcg by mouth daily before breakfast.   unknown at unknown  . lisinopril (PRINIVIL,ZESTRIL) 40 MG tablet Take 40 mg by mouth daily.   unknown at unknown  . loperamide (IMODIUM A-D) 2 MG tablet Take 2 mg by mouth daily.   unknown at unknown  . Probiotic Product ([place]) CAPS Take 1 capsule by mouth daily.   unknown at unknown  . saccharomyces boulardii (FLORASTOR) 250 MG capsule Take 250 mg by mouth daily.   unknown at unknown  . sitaGLIPtin-metformin (JANUMET) 50-500 MG per tablet Take 2 tablets by mouth every evening.    unknown at unknown  . warfarin (COUMADIN) 4 MG tablet Take 4 mg by mouth as directed. Takes on Sat and Sun   unknown at unknown  . warfarin (COUMADIN) 5 MG tablet Take 5 mg by mouth daily.   unknown at unknown  . amitriptyline (ELAVIL) 10 MG tablet Take 10 mg by mouth at bedtime as needed for sleep.  Not Taking at Unknown time  . enoxaparin (LOVENOX) 40 MG/0.4ML injection Inject 0.4 mLs (40 mg total) into the skin daily. (Patient not taking: Reported on 08/09/2019) 4 Syringe 0 Not Taking at Unknown time    Assessment: Pt on warfarin at home PTA.  INR is below goal.  Home dose reportedly 5mg  Mon-Fri and 4mg  on Sat-Sun.    Goal of Therapy:  INR 2-3 Monitor platelets by anticoagulation protocol: Yes   Plan:  Warfarin 5mg  now x 1 Monitor INR daily to determine warfarin dosing  [place] Crane, Harm Jou A 08/09/2019,1:58 AM

## 2019-08-09 NOTE — Progress Notes (Signed)
Advanced care plan. Purpose of the Encounter: CODE STATUS Parties in Attendance: Patient Patient's Decision Capacity: Good Subjective/Patient's story: Sierra [name]  is a 83 y.o. female who presents with chief complaint as above.  Patient presents the ED with a complaint of 1 day of increased urinary frequency with incomplete voiding.  She states she has never had a UTI before.  She states she also had some strange feeling of unsteadiness and intermittent confusion.  Here in the ED she is found to meet sepsis criteria with fever and leukocytosis.  She has nitrite positive UA.  Hospitalist were called for admission Objective/Medical story Patient needs IV antibiotics for sepsis management and UTI infection treatment Needs cultures and follow-up of WBC count Goals of care determination:  Advance care directives goals of care treatment plan discussed Patient wants everything done which includes CPR, intubation ventilator the need arises CODE STATUS: Full code Time spent discussing advanced care planning: 16 minutes

## 2019-08-09 NOTE — Progress Notes (Signed)
ANTICOAGULATION CONSULT NOTE  Pharmacy Consult for Warfarin Indication: VTE prophylaxis - Protein C and Protein S deficiency  Allergies  Allergen Reactions  . Gentamicin Other (See Comments)    Kidney function  . Morphine And Related Other (See Comments)    AMS - agitation and delusions **No legal documents to be signed if taking, per family**    Patient Measurements: Height: 5\' 7"  (170.2 cm) Weight: 189 lb 2.5 oz (85.8 kg) IBW/kg (Calculated) : 61.6  Vital Signs: Temp: 98.7 F (37.1 C) (10/01 0514) Temp Source: Oral (10/01 0514) BP: 159/93 (10/01 0514) Pulse Rate: 87 (10/01 0514)  Labs: Recent Labs    08/08/19 2037 08/09/19 0109 08/09/19 0356  HGB 14.2  --  12.9  HCT 43.9  --  39.3  PLT 206  --  164  LABPROT  --  19.1*  --   INR  --  1.6*  --   CREATININE 0.93  --  0.95    Estimated Creatinine Clearance: 52.3 mL/min (by C-G formula based on SCr of 0.95 mg/dL).   Medical History: Past Medical History:  Diagnosis Date  . Anginal pain ([place])   . Anxiety   . Diabetes mellitus without complication ([place])   . Hypertension   . Hypothyroidism   . Protein C deficiency ([place])   . Protein S deficiency ([place])     Assessment: Pt on warfarin at home PTA. Home dose 5mg  Mon-Fri and 4mg  on Sat-Sun. This was a recent change in her regimen.    Goal of Therapy:  INR 2-3 Monitor platelets by anticoagulation protocol: Yes   Plan:  INR 1.6 last night. Patient given warfarin 5 mg this morning ~ 0500. Will give an additional 2.5 mg tonight for a total of 1.5x home dose. Will follow up INR with morning labs.  Tawnya Crook, PharmD 08/09/2019,11:43 AM

## 2019-08-09 NOTE — Care Management Important Message (Signed)
Important Message  Patient Details  Name: Sierra [name] MRN: [id number] Date of Birth: 1936-05-01   Medicare Important Message Given:  Yes  Initial Medicare IM given by Patient Access Associate on 08/08/2019 at 11:33pm.    Dannette Barbara 08/09/2019, 2:01 PM

## 2019-08-09 NOTE — Evaluation (Signed)
Physical Therapy Evaluation Patient Details Name: Sierra [name] MRN: [id number] DOB: 02-21-1936 Today's Date: 08/09/2019   History of Present Illness  presented to ER secondary to AMS, weakness; admitted for management of sepsis related to UTI.  Clinical Impression  Upon evaluation, patient alert and oriented to basic information; follows commands and demonstrates fair/good insight and safety awareness.  Bilat UE/LE strength and ROM grossly symmetrical and WFL; no focal weakness, pain reported.  Able to complete bed mobility with mod indep; sit/stand, basic transfers and gait (60') with RW, cga/close sup. Demonstrates reciprocal stepping pattenr with good step height/length, good walker position and mangement; completes turns, obstacle negotiation without LOB or safety concern. Appears to be at/near baseline level of functional ability; patient voices agreement. Will maintain on caseload to ensure continued mobility throughout hospital stay; anticipate no formal PT needs upon discharge.     Follow Up Recommendations No PT follow up(will maintain on caseload throughout stay; anticipate no formal PT needs upon discharge)    Equipment Recommendations  (has necessary equip)    Recommendations for Other Services       Precautions / Restrictions Precautions Precautions: Fall Restrictions Weight Bearing Restrictions: No      Mobility  Bed Mobility Overal bed mobility: Modified Independent                Transfers Overall transfer level: Needs assistance Equipment used: Rolling walker (2 wheeled) Transfers: Sit to/from Stand Sit to Stand: Min guard         General transfer comment: good LE strenght/power, good hand placement  Ambulation/Gait Ambulation/Gait assistance: Min guard;Supervision Gait Distance (Feet): 60 Feet Assistive device: Rolling walker (2 wheeled)       General Gait Details: reciprocal stepping pattenr with good step height/length, good walker  position and mangement; completes turns, obstacle negotiation without LOB or safety concern.  Stairs            Wheelchair Mobility    Modified Rankin (Stroke Patients Only)       Balance Overall balance assessment: Needs assistance Sitting-balance support: No upper extremity supported;Feet supported Sitting balance-Leahy Scale: Good     Standing balance support: Bilateral upper extremity supported Standing balance-Leahy Scale: Fair                               Pertinent Vitals/Pain Pain Assessment: No/denies pain    Home Living Family/patient expects to be discharged to:: Private residence Living Arrangements: Spouse/significant other Available Help at Discharge: Family;Available PRN/intermittently Type of Home: House Home Access: Level entry     Home Layout: One level Home Equipment: Walker - 2 wheels Additional Comments: Resident of indep living of [employer]    Prior Function Level of Independence: Independent with assistive device(s)         Comments: Mod indep with ADLs, household and some community distances; + driving; has hired assist for household cleaning/chores 1x/month     Hand Dominance        Extremity/Trunk Assessment   Upper Extremity Assessment Upper Extremity Assessment: Overall WFL for tasks assessed(grossly 4/5 throughout)    Lower Extremity Assessment Lower Extremity Assessment: Overall WFL for tasks assessed(grossly 4/5 throughout)       Communication   Communication: No difficulties  Cognition Arousal/Alertness: Awake/alert Behavior During Therapy: WFL for tasks assessed/performed Overall Cognitive Status: Within Functional Limits for tasks assessed  General Comments      Exercises     Assessment/Plan    PT Assessment Patient needs continued PT services  PT Problem List Decreased activity tolerance;Decreased balance;Decreased mobility;Decreased  safety awareness       PT Treatment Interventions Gait training;DME instruction;Functional mobility training;Therapeutic activities;Therapeutic exercise;Balance training;Patient/family education    PT Goals (Current goals can be found in the Care Plan section)  Acute Rehab PT Goals Patient Stated Goal: to return to [facility] PT Goal Formulation: With patient Time For Goal Achievement: 08/23/19 Potential to Achieve Goals: Good    Frequency Min 2X/week   Barriers to discharge        Co-evaluation               AM-PAC PT "6 Clicks" Mobility  Outcome Measure Help needed turning from your back to your side while in a flat bed without using bedrails?: None Help needed moving from lying on your back to sitting on the side of a flat bed without using bedrails?: None Help needed moving to and from a bed to a chair (including a wheelchair)?: None Help needed standing up from a chair using your arms (e.g., wheelchair or bedside chair)?: None Help needed to walk in hospital room?: None Help needed climbing 3-5 steps with a railing? : A Little 6 Click Score: 23    End of Session Equipment Utilized During Treatment: Gait belt Activity Tolerance: Patient tolerated treatment well Patient left: in chair;with call bell/phone within reach;with chair alarm set Nurse Communication: Mobility status PT Visit Diagnosis: Muscle weakness (generalized) (M62.81);Difficulty in walking, not elsewhere classified (R26.2)    Time: 1345-1401 PT Time Calculation (min) (ACUTE ONLY): 16 min   Charges:   PT Evaluation $PT Eval Moderate Complexity: 1 Mod         Robben Jagiello H. Owens Shark, PT, DPT, NCS 08/09/19, 2:16 PM [phone]

## 2019-08-09 NOTE — Progress Notes (Signed)
Sierra [name] at [place] NAME: Sierra [name]    MR#:  [id number]  DATE OF BIRTH:  03/03/1936  SUBJECTIVE:  CHIEF COMPLAINT:   Chief Complaint  Patient presents with  . Altered Mental Status  Patient seen and evaluated today Awake and responds to all verbal commands Not confused Tolerating diet okay No fever No shortness of breath  REVIEW OF SYSTEMS:    ROS  CONSTITUTIONAL: No documented fever. Has fatigue, weakness. No weight gain, no weight loss.  EYES: No blurry or double vision.  ENT: No tinnitus. No postnasal drip. No redness of the oropharynx.  RESPIRATORY: No cough, no wheeze, no hemoptysis. No dyspnea.  CARDIOVASCULAR: No chest pain. No orthopnea. No palpitations. No syncope.  GASTROINTESTINAL: No nausea, no vomiting or diarrhea. No abdominal pain. No melena or hematochezia.  GENITOURINARY: No dysuria or hematuria.  ENDOCRINE: No polyuria or nocturia. No heat or cold intolerance.  HEMATOLOGY: No anemia. No bruising. No bleeding.  INTEGUMENTARY: No rashes. No lesions.  MUSCULOSKELETAL: No arthritis. No swelling. No gout.  NEUROLOGIC: No numbness, tingling, or ataxia. No seizure-type activity.  PSYCHIATRIC: No anxiety. No insomnia. No ADD.   DRUG ALLERGIES:   Allergies  Allergen Reactions  . Gentamicin Other (See Comments)    Kidney function  . Morphine And Related Other (See Comments)    AMS - agitation and delusions **No legal documents to be signed if taking, per family**    VITALS:  Blood pressure (!) 159/93, pulse 87, temperature 98.7 F (37.1 C), temperature source Oral, resp. rate 18, height 5\' 7"  (1.702 m), weight 85.8 kg, SpO2 94 %.  PHYSICAL EXAMINATION:   Physical Exam  GENERAL:  83 y.o.-year-old patient lying in the bed with no acute distress.  EYES: Pupils equal, round, reactive to light and accommodation. No scleral icterus. Extraocular muscles intact.  HEENT: Head atraumatic, normocephalic. Oropharynx and  nasopharynx clear.  NECK:  Supple, no jugular venous distention. No thyroid enlargement, no tenderness.  LUNGS: Normal breath sounds bilaterally, no wheezing, rales, rhonchi. No use of accessory muscles of respiration.  CARDIOVASCULAR: S1, S2 normal. No murmurs, rubs, or gallops.  ABDOMEN: Soft, nontender, nondistended. Bowel sounds present. No organomegaly or mass.  EXTREMITIES: No cyanosis, clubbing or edema b/l.    NEUROLOGIC: Cranial nerves II through XII are intact. No focal Motor or sensory deficits b/l.   PSYCHIATRIC: The patient is alert and oriented x 3.  SKIN: No obvious rash, lesion, or ulcer.   LABORATORY PANEL:   CBC Recent Labs  Lab 08/09/19 0356  WBC 10.7*  HGB 12.9  HCT 39.3  PLT 164   ------------------------------------------------------------------------------------------------------------------ Chemistries  Recent Labs  Lab 08/08/19 2037 08/09/19 0356  NA 136 141  K 3.3* 3.7  CL 96* 105  CO2 28 29  GLUCOSE 191* 166*  BUN 26* 23  CREATININE 0.93 0.95  CALCIUM 9.2 8.6*  AST 13*  --   ALT 13  --   ALKPHOS 55  --   BILITOT 1.5*  --    ------------------------------------------------------------------------------------------------------------------  Cardiac Enzymes No results for input(s): TROPONINI in the last 168 hours. ------------------------------------------------------------------------------------------------------------------  RADIOLOGY:  Ct Head Wo Contrast  Result Date: 08/08/2019 CLINICAL DATA:  Altered level of consciousness (LOC), unexplained. Confusion and impaired gait. EXAM: CT HEAD WITHOUT CONTRAST TECHNIQUE: Contiguous axial images were obtained from the base of the skull through the vertex without intravenous contrast. COMPARISON:  Head CT 01/11/2019 FINDINGS: Brain: No intracranial hemorrhage, mass effect, or midline shift. Brain  volume is normal for age. Mild chronic small vessel ischemia, unchanged. No hydrocephalus. The basilar  cisterns are patent. No evidence of territorial infarct or acute ischemia. No extra-axial or intracranial fluid collection. Vascular: No hyperdense vessel. Skull: No fracture or focal lesion. Sinuses/Orbits: Paranasal sinuses and mastoid air cells are clear. The visualized orbits are unremarkable. Other: None. IMPRESSION: 1. No acute intracranial abnormality. 2. Stable degree of atrophy and chronic small vessel ischemia. Electronically Signed   By: Keith Rake M.D.   On: 08/08/2019 21:50   Dg Chest Portable 1 View  Result Date: 08/08/2019 CLINICAL DATA:  83 year old female with altered mental status. EXAM: PORTABLE CHEST 1 VIEW COMPARISON:  Chest radiograph dated 01/11/2019 FINDINGS: Mild diffuse interstitial coarsening. No focal consolidation, pleural effusion, pneumothorax. There is eventration of the right hemidiaphragm. Stable mild cardiomegaly. No acute osseous pathology. IMPRESSION: 1. No acute cardiopulmonary process. 2. Stable mild cardiomegaly. Electronically Signed   By: Anner Crete M.D.   On: 08/08/2019 21:36     ASSESSMENT AND PLAN:   83 year old female patient with history of protein C deficiency protein S deficiency hypertension diabetes mellitus type 2, hypothyroidism currently under hospitalist service  -Sepsis secondary to UTI Improving Continue antibiotics Follow-up cultures and lactic acid  -Acute urinary tract infection Follow-up culture Continue IV Rocephin antibiotic  -Protein C and protein S deficiency Renew anticoagulation with warfarin  -DVT prophylaxis On anticoagulation with warfarin  All the records are reviewed and case discussed with Care Management/Social Worker. Management plans discussed with the patient, family and they are in agreement.  CODE STATUS: Full code  DVT Prophylaxis: SCDs  TOTAL TIME TAKING CARE OF THIS PATIENT: 36 minutes.   POSSIBLE D/C IN 1 to 2 DAYS, DEPENDING ON CLINICAL CONDITION.  Saundra Shelling M.D on 08/09/2019 at  10:08 AM  Between 7am to 6pm - Pager - [phone]  After 6pm go to [email or web address] - password [email or web address] [facility]  Office  [phone]  CC: Primary care physician; Whitaker, Rolanda Jay, PA-C  Note: This dictation was prepared with Dragon dictation along with smaller phrase technology. Any transcriptional errors that result from this process are unintentional.

## 2019-08-09 NOTE — Progress Notes (Signed)
PHARMACY - PHYSICIAN COMMUNICATION CRITICAL VALUE ALERT - BLOOD CULTURE IDENTIFICATION (BCID)  Sierra [name] is an 83 y.o. female who presented to [facility] on 08/08/2019 with a chief complaint of confusion and increased urination  Assessment:  1 bottle E. coli, KPC negative (suspect urinary source)  Name of physician (or Provider) Contacted: Dr. Estanislado Pandy  Current antibiotics: Rocephin 1 g IV q24h  Changes to prescribed antibiotics recommended: Increase Rocephin to 2 g IV q24h. Patient has been afebrile, WBC trending down. No h/o ESBL infection per chart review. Will defer meropenem at this time.  Results for orders placed or performed during the hospital encounter of 08/08/19  Blood Culture ID Panel (Reflexed) (Collected: 08/08/2019  9:54 PM)  Result Value Ref Range   Enterococcus species NOT DETECTED NOT DETECTED   Listeria monocytogenes NOT DETECTED NOT DETECTED   Staphylococcus species NOT DETECTED NOT DETECTED   Staphylococcus aureus (BCID) NOT DETECTED NOT DETECTED   Streptococcus species NOT DETECTED NOT DETECTED   Streptococcus agalactiae NOT DETECTED NOT DETECTED   Streptococcus pneumoniae NOT DETECTED NOT DETECTED   Streptococcus pyogenes NOT DETECTED NOT DETECTED   Acinetobacter baumannii NOT DETECTED NOT DETECTED   Enterobacteriaceae species DETECTED (A) NOT DETECTED   Enterobacter cloacae complex NOT DETECTED NOT DETECTED   Escherichia coli DETECTED (A) NOT DETECTED   Klebsiella oxytoca NOT DETECTED NOT DETECTED   Klebsiella pneumoniae NOT DETECTED NOT DETECTED   Proteus species NOT DETECTED NOT DETECTED   Serratia marcescens NOT DETECTED NOT DETECTED   Carbapenem resistance NOT DETECTED NOT DETECTED   Haemophilus influenzae NOT DETECTED NOT DETECTED   Neisseria meningitidis NOT DETECTED NOT DETECTED   Pseudomonas aeruginosa NOT DETECTED NOT DETECTED   Candida albicans NOT DETECTED NOT DETECTED   Candida glabrata NOT DETECTED NOT DETECTED   Candida krusei NOT DETECTED  NOT DETECTED   Candida parapsilosis NOT DETECTED NOT DETECTED   Candida tropicalis NOT DETECTED NOT DETECTED    Tawnya Crook, PharmD 08/09/2019  2:11 PM

## 2019-08-10 LAB — GLUCOSE, CAPILLARY
Glucose-Capillary: 165 mg/dL — ABNORMAL HIGH (ref 70–99)
Glucose-Capillary: 159 mg/dL — ABNORMAL HIGH (ref 70–99)
Glucose-Capillary: 150 mg/dL — ABNORMAL HIGH (ref 70–99)
Glucose-Capillary: 183 mg/dL — ABNORMAL HIGH (ref 70–99)

## 2019-08-10 LAB — PROTIME-INR
INR: 1.4 — ABNORMAL HIGH (ref 0.8–1.2)
Prothrombin Time: 16.6 seconds — ABNORMAL HIGH (ref 11.4–15.2)

## 2019-08-10 MED ORDER — WARFARIN SODIUM 7.5 MG PO TABS
7.5000 mg | ORAL_TABLET | Freq: Once | ORAL | Status: AC
  Administered 2019-08-10: 7.5 mg via ORAL
  Filled 2019-08-10: qty 1

## 2019-08-10 MED ORDER — DULOXETINE HCL 30 MG PO CPEP
60.0000 mg | ORAL_CAPSULE | Freq: Every day | ORAL | Status: DC
  Administered 2019-08-10: 60 mg via ORAL
  Filled 2019-08-10: qty 2

## 2019-08-10 MED ORDER — AMITRIPTYLINE HCL 10 MG PO TABS
10.0000 mg | ORAL_TABLET | Freq: Every day | ORAL | Status: DC
  Administered 2019-08-10: 10 mg via ORAL
  Filled 2019-08-10 (×2): qty 1

## 2019-08-10 NOTE — Care Management Important Message (Signed)
Important Message  Patient Details  Name: Sierra [name] MRN: [id number] Date of Birth: [birth year]   Medicare Important Message Given:  Yes     Juliann Pulse A Irlanda Croghan 08/10/2019, 11:19 AM

## 2019-08-10 NOTE — Progress Notes (Signed)
ANTICOAGULATION CONSULT NOTE  Pharmacy Consult for Warfarin Indication: VTE prophylaxis - Protein C and Protein S deficiency  Patient Measurements: Height: 5\' 7"  (170.2 cm) Weight: 189 lb 2.5 oz (85.8 kg) IBW/kg (Calculated) : 61.6  Vital Signs: Temp: 98.6 F (37 C) (10/02 0523) Temp Source: Oral (10/02 0523) BP: 145/85 (10/02 0523) Pulse Rate: 86 (10/02 0523)  Labs: Recent Labs    08/08/19 2037 08/09/19 0109 08/09/19 0356 08/10/19 0435  HGB 14.2  --  12.9  --   HCT 43.9  --  39.3  --   PLT 206  --  164  --   LABPROT  --  19.1*  --  16.6*  INR  --  1.6*  --  1.4*  CREATININE 0.93  --  0.95  --     Estimated Creatinine Clearance: 52.3 mL/min (by C-G formula based on SCr of 0.95 mg/dL).   Medical History: Past Medical History:  Diagnosis Date  . Anginal pain ([place])   . Anxiety   . Diabetes mellitus without complication ([place])   . Hypertension   . Hypothyroidism   . Protein C deficiency ([place])   . Protein S deficiency ([place])     Assessment: Pt on warfarin at home PTA. Home dose 5mg  Mon-Fri and 4mg  on Sat-Sun. This was a recent change in her regimen. Reviewing Care Everywhere her INR can be rather labile and move rapidly, although in her previous visit here she never achieved therapeutic status on mostly 6 mg doses.   DDIs: ceftriaxone started 10/1   Date   INR   Dose 10/1   1.6   7.5 mg 10/2   1.4    7.5 mg  Goal of Therapy:  INR 2-3 Monitor platelets by anticoagulation protocol: Yes   Plan:   INR subtherapeutic: repeat 7.5 mg warfarin this evening ( 1.5x home dose)   Will follow up INR with morning labs to guide dose  CBC at least every 3 days per protocol  Dallie Piles, PharmD 08/10/2019,7:15 AM

## 2019-08-10 NOTE — Progress Notes (Signed)
[facility] [place] NAME: Sierra [name]    MR#:  [id number]  DATE OF BIRTH:  [birth year]  SUBJECTIVE:  CHIEF COMPLAINT:   Chief Complaint  Patient presents with  . Altered Mental Status  Patient seen and evaluated today Awake and responds to all verbal commands Not confused at all Tolerating diet okay No fever No shortness of breath  REVIEW OF SYSTEMS:    ROS  CONSTITUTIONAL: No documented fever. Has fatigue, weakness. No weight gain, no weight loss.  EYES: No blurry or double vision.  ENT: No tinnitus. No postnasal drip. No redness of the oropharynx.  RESPIRATORY: No cough, no wheeze, no hemoptysis. No dyspnea.  CARDIOVASCULAR: No chest pain. No orthopnea. No palpitations. No syncope.  GASTROINTESTINAL: No nausea, no vomiting or diarrhea. No abdominal pain. No melena or hematochezia.  GENITOURINARY: No dysuria or hematuria.  ENDOCRINE: No polyuria or nocturia. No heat or cold intolerance.  HEMATOLOGY: No anemia. No bruising. No bleeding.  INTEGUMENTARY: No rashes. No lesions.  MUSCULOSKELETAL: No arthritis. No swelling. No gout.  NEUROLOGIC: No numbness, tingling, or ataxia. No seizure-type activity.  PSYCHIATRIC: No anxiety. No insomnia. No ADD.   DRUG ALLERGIES:   Allergies  Allergen Reactions  . Gentamicin Other (See Comments)    Kidney function  . Morphine And Related Other (See Comments)    AMS - agitation and delusions **No legal documents to be signed if taking, per family**    VITALS:  Blood pressure (!) 145/85, pulse 86, temperature 98.6 F (37 C), temperature source Oral, resp. rate 20, height 5\' 7"  (1.702 m), weight 85.8 kg, SpO2 94 %.  PHYSICAL EXAMINATION:   Physical Exam  GENERAL:  83 y.o.-year-old patient lying in the bed with no acute distress.  EYES: Pupils equal, round, reactive to light and accommodation. No scleral icterus. Extraocular muscles intact.  HEENT: Head atraumatic, normocephalic.  Oropharynx and nasopharynx clear.  NECK:  Supple, no jugular venous distention. No thyroid enlargement, no tenderness.  LUNGS: Normal breath sounds bilaterally, no wheezing, rales, rhonchi. No use of accessory muscles of respiration.  CARDIOVASCULAR: S1, S2 normal. No murmurs, rubs, or gallops.  ABDOMEN: Soft, nontender, nondistended. Bowel sounds present. No organomegaly or mass.  EXTREMITIES: No cyanosis, clubbing or edema b/l.    NEUROLOGIC: Cranial nerves II through XII are intact. No focal Motor or sensory deficits b/l.   PSYCHIATRIC: The patient is alert and oriented x 3.  SKIN: No obvious rash, lesion, or ulcer.   LABORATORY PANEL:   CBC Recent Labs  Lab 08/09/19 0356  WBC 10.7*  HGB 12.9  HCT 39.3  PLT 164   ------------------------------------------------------------------------------------------------------------------ Chemistries  Recent Labs  Lab 08/08/19 2037 08/09/19 0356  NA 136 141  K 3.3* 3.7  CL 96* 105  CO2 28 29  GLUCOSE 191* 166*  BUN 26* 23  CREATININE 0.93 0.95  CALCIUM 9.2 8.6*  AST 13*  --   ALT 13  --   ALKPHOS 55  --   BILITOT 1.5*  --    ------------------------------------------------------------------------------------------------------------------  Cardiac Enzymes No results for input(s): TROPONINI in the last 168 hours. ------------------------------------------------------------------------------------------------------------------  RADIOLOGY:  Ct Head Wo Contrast  Result Date: 08/08/2019 CLINICAL DATA:  Altered level of consciousness (LOC), unexplained. Confusion and impaired gait. EXAM: CT HEAD WITHOUT CONTRAST TECHNIQUE: Contiguous axial images were obtained from the base of the skull through the vertex without intravenous contrast. COMPARISON:  Head CT 01/11/2019 FINDINGS: Brain: No intracranial hemorrhage, mass effect, or midline  shift. Brain volume is normal for age. Mild chronic small vessel ischemia, unchanged. No  hydrocephalus. The basilar cisterns are patent. No evidence of territorial infarct or acute ischemia. No extra-axial or intracranial fluid collection. Vascular: No hyperdense vessel. Skull: No fracture or focal lesion. Sinuses/Orbits: Paranasal sinuses and mastoid air cells are clear. The visualized orbits are unremarkable. Other: None. IMPRESSION: 1. No acute intracranial abnormality. 2. Stable degree of atrophy and chronic small vessel ischemia. Electronically Signed   By: Keith Rake M.D.   On: 08/08/2019 21:50   Dg Chest Portable 1 View  Result Date: 08/08/2019 CLINICAL DATA:  83 year old female with altered mental status. EXAM: PORTABLE CHEST 1 VIEW COMPARISON:  Chest radiograph dated 01/11/2019 FINDINGS: Mild diffuse interstitial coarsening. No focal consolidation, pleural effusion, pneumothorax. There is eventration of the right hemidiaphragm. Stable mild cardiomegaly. No acute osseous pathology. IMPRESSION: 1. No acute cardiopulmonary process. 2. Stable mild cardiomegaly. Electronically Signed   By: Anner Crete M.D.   On: 08/08/2019 21:36     ASSESSMENT AND PLAN:   83 year old female patient with history of protein C deficiency protein S deficiency hypertension diabetes mellitus type 2, hypothyroidism currently under hospitalist service  -Sepsis secondary to UTI and bacteremia Improving Continue antibiotics intravenously Follow-up cultures and lactic acid  -Acute urinary tract infection with gram-negative rods Follow-up culture and sensitivity Continue IV Rocephin antibiotic  -Enterobacter and E. coli bacteremia Follow-up complete culture and sensitivity report Continue IV antibiotics for now  -Protein C and protein S deficiency Renew anticoagulation with warfarin  -DVT prophylaxis On anticoagulation with warfarin  All the records are reviewed and case discussed with Care Management/Social Worker. Management plans discussed with the patient, family and they are in  agreement.  CODE STATUS: Full code  DVT Prophylaxis: SCDs  TOTAL TIME TAKING CARE OF THIS PATIENT: 38 minutes.   POSSIBLE D/C IN 1 to 2 DAYS, DEPENDING ON CLINICAL CONDITION.  Saundra Shelling M.D on 08/10/2019 at 10:36 AM  Between 7am to 6pm - Pager - [phone]  After 6pm go to [email or web address] - password [email or web address] [facility] [facility]  Office  [phone]  CC: Primary care physician; Whitaker, Rolanda Jay, PA-C  Note: This dictation was prepared with Dragon dictation along with smaller phrase technology. Any transcriptional errors that result from this process are unintentional.

## 2019-08-11 LAB — PROTIME-INR
INR: 1.3 — ABNORMAL HIGH (ref 0.8–1.2)
Prothrombin Time: 16.1 seconds — ABNORMAL HIGH (ref 11.4–15.2)

## 2019-08-11 LAB — GLUCOSE, CAPILLARY: Glucose-Capillary: 167 mg/dL — ABNORMAL HIGH (ref 70–99)

## 2019-08-11 MED ORDER — CEPHALEXIN 500 MG PO CAPS: 500.0000 mg | ORAL_CAPSULE | Freq: Three times a day (TID) | ORAL | 0 refills | Status: AC

## 2019-08-11 NOTE — Progress Notes (Signed)
Order given from Dr Estanislado Pandy to discontinue telemetry

## 2019-08-11 NOTE — Progress Notes (Signed)
Sierra Sierra [name] and O x4. VSS. Pt tolerating diet well. No complaints of nausea or vomiting. IV removed intact, prescriptions given. Pt voices understanding of discharge instructions with no further questions. Patient discharged via wheelchair with RN  Allergies as of 08/11/2019      Reactions   Gentamicin Other (See Comments)   Kidney function   Morphine And Related Other (See Comments)   AMS - agitation and delusions **No legal documents to be signed if taking, per family**      Medication List    STOP taking these medications   enoxaparin 40 MG/0.4ML injection Commonly known as: LOVENOX     TAKE these medications   alendronate 70 MG tablet Commonly known as: FOSAMAX Take 70 mg by mouth once Sierra [name] week.   amitriptyline 10 MG tablet Commonly known as: ELAVIL Take 10 mg by mouth at bedtime as needed for sleep.   amLODipine 2.5 MG tablet Commonly known as: NORVASC Take 2.5 mg by mouth daily.   Apriso 0.375 g 24 hr capsule Generic drug: mesalamine Take 1.5 g by mouth daily.   atenolol 25 MG tablet Commonly known as: TENORMIN Take 25 mg by mouth daily.   atorvastatin 40 MG tablet Commonly known as: LIPITOR Take 40 mg by mouth daily.   calcium carbonate 1500 (600 Ca) MG Tabs tablet Commonly known as: OSCAL Take 600 mg of elemental calcium by mouth daily.   cephALEXin 500 MG capsule Commonly known as: KEFLEX Take 1 capsule (500 mg total) by mouth 3 (three) times daily for 7 days.   cholecalciferol 25 MCG (1000 UT) tablet Commonly known as: VITAMIN D3 Take 3,000 Units by mouth daily. (taken with calcium)   DULoxetine 60 MG capsule Commonly known as: CYMBALTA Take 60 mg by mouth at bedtime.   glipiZIDE 2.5 MG 24 hr tablet Commonly known as: GLUCOTROL XL Take 2.5 mg by mouth daily.   levothyroxine 88 MCG tablet Commonly known as: SYNTHROID Take 88 mcg by mouth daily before breakfast.   lisinopril 40 MG tablet Commonly known as: ZESTRIL Take 40 mg by mouth  daily.   loperamide 2 MG tablet Commonly known as: IMODIUM Sierra [name]-D Take 2 mg by mouth daily.   [facility] Caps Take 1 capsule by mouth daily.   saccharomyces boulardii 250 MG capsule Commonly known as: FLORASTOR Take 250 mg by mouth daily.   sitaGLIPtin-metformin 50-500 MG tablet Commonly known as: JANUMET Take 2 tablets by mouth every evening.   warfarin 5 MG tablet Commonly known as: COUMADIN Take 5 mg by mouth daily.   warfarin 4 MG tablet Commonly known as: COUMADIN Take 4 mg by mouth as directed. Takes on Sat and Sun       Vitals:   08/10/19 2011 08/11/19 0411  BP: (!) 189/106 (!) 163/78  Pulse: 92 82  Resp: 16 16  Temp: 98.7 F (37.1 C) 98.4 F (36.9 C)  SpO2: 98% 96%    Sierra Sierra [name]

## 2019-08-11 NOTE — Discharge Summary (Signed)
[place] [place] NAME: Sierra [name]    MR#:  [id number]  DATE OF BIRTH:  1936/08/13  DATE OF ADMISSION:  08/08/2019 ADMITTING PHYSICIAN: Lance Coon, MD  DATE OF DISCHARGE: 08/11/2019  PRIMARY CARE PHYSICIAN: Donnamarie Rossetti, PA-C   ADMISSION DIAGNOSIS:  Lower urinary tract infectious disease [N39.0] Confusion [R41.0] Sepsis without acute organ dysfunction, due to unspecified organism ([place]) [A41.9]  DISCHARGE DIAGNOSIS:  Principal Problem:   Sepsis ([place]) Active Problems:   UTI (urinary tract infection)   Hypothyroidism   HTN (hypertension)   Diabetes ([place])   Protein C deficiency ([place])   Protein S deficiency ([place]) E. coli bacteremia E. coli UTI Enterobacteriaceae bacteremia  SECONDARY DIAGNOSIS:   Past Medical History:  Diagnosis Date  . Anginal pain ([place])   . Anxiety   . Diabetes mellitus without complication ([place])   . Hypertension   . Hypothyroidism   . Protein C deficiency ([place])   . Protein S deficiency [facility])      ADMITTING HISTORY Sierra [name]  is a 83 y.o. female who presents with chief complaint as above.  Patient presents the ED with a complaint of 1 day of increased urinary frequency with incomplete voiding.  She states she has never had a UTI before.  She states she also had some strange feeling of unsteadiness and intermittent confusion.  Here in the ED she is found to meet sepsis criteria with fever and leukocytosis.  She has nitrite positive UA.  Hospitalist were called for admission  HOSPITAL COURSE:  Patient was admitted to medical floor.  Patient received IV fluids and antibiotics.  Patient was septic secondary to UTI and bacteremia.  Urine and blood cultures grew E. coli and Enterobacteriaceae.  Patient's leukocytosis improved.  Fever also resolved.  She received physical therapy during hospitalization.  Patient tolerated diet well and switch to oral antibiotic.  Patient continued anticoagulation with  warfarin for protein C and protein S deficiency.  CONSULTS OBTAINED:    DRUG ALLERGIES:   Allergies  Allergen Reactions  . Gentamicin Other (See Comments)    Kidney function  . Morphine And Related Other (See Comments)    AMS - agitation and delusions **No legal documents to be signed if taking, per family**    DISCHARGE MEDICATIONS:   Allergies as of 08/11/2019      Reactions   Gentamicin Other (See Comments)   Kidney function   Morphine And Related Other (See Comments)   AMS - agitation and delusions **No legal documents to be signed if taking, per family**      Medication List    STOP taking these medications   enoxaparin 40 MG/0.4ML injection Commonly known as: LOVENOX     TAKE these medications   alendronate 70 MG tablet Commonly known as: FOSAMAX Take 70 mg by mouth once a week.   amitriptyline 10 MG tablet Commonly known as: ELAVIL Take 10 mg by mouth at bedtime as needed for sleep.   amLODipine 2.5 MG tablet Commonly known as: NORVASC Take 2.5 mg by mouth daily.   Apriso 0.375 g 24 hr capsule Generic drug: mesalamine Take 1.5 g by mouth daily.   atenolol 25 MG tablet Commonly known as: TENORMIN Take 25 mg by mouth daily.   atorvastatin 40 MG tablet Commonly known as: LIPITOR Take 40 mg by mouth daily.   calcium carbonate 1500 (600 Ca) MG Tabs tablet Commonly known as: OSCAL Take 600 mg of elemental calcium by mouth  daily.   cephALEXin 500 MG capsule Commonly known as: KEFLEX Take 1 capsule (500 mg total) by mouth 3 (three) times daily for 7 days.   cholecalciferol 25 MCG (1000 UT) tablet Commonly known as: VITAMIN D3 Take 3,000 Units by mouth daily. (taken with calcium)   DULoxetine 60 MG capsule Commonly known as: CYMBALTA Take 60 mg by mouth at bedtime.   glipiZIDE 2.5 MG 24 hr tablet Commonly known as: GLUCOTROL XL Take 2.5 mg by mouth daily.   levothyroxine 88 MCG tablet Commonly known as: SYNTHROID Take 88 mcg by mouth daily  before breakfast.   lisinopril 40 MG tablet Commonly known as: ZESTRIL Take 40 mg by mouth daily.   loperamide 2 MG tablet Commonly known as: IMODIUM A-D Take 2 mg by mouth daily.   [facility] Health Caps Take 1 capsule by mouth daily.   saccharomyces boulardii 250 MG capsule Commonly known as: FLORASTOR Take 250 mg by mouth daily.   sitaGLIPtin-metformin 50-500 MG tablet Commonly known as: JANUMET Take 2 tablets by mouth every evening.   warfarin 5 MG tablet Commonly known as: COUMADIN Take 5 mg by mouth daily.   warfarin 4 MG tablet Commonly known as: COUMADIN Take 4 mg by mouth as directed. Takes on Sat and Sun       Today  Patient seen and evaluated today No fever No shortness of breath Hemodynamically stable VITAL SIGNS:  Blood pressure (!) 163/78, pulse 82, temperature 98.4 F (36.9 C), temperature source Oral, resp. rate 16, height 5\' 7"  (1.702 m), weight 85.8 kg, SpO2 96 %.  I/O:    Intake/Output Summary (Last 24 hours) at 08/11/2019 1131 Last data filed at 08/10/2019 2301 Gross per 24 hour  Intake 579.69 ml  Output 300 ml  Net 279.69 ml    PHYSICAL EXAMINATION:  Physical Exam  GENERAL:  83 y.o.-year-old patient lying in the bed with no acute distress.  LUNGS: Normal breath sounds bilaterally, no wheezing, rales,rhonchi or crepitation. No use of accessory muscles of respiration.  CARDIOVASCULAR: S1, S2 normal. No murmurs, rubs, or gallops.  ABDOMEN: Soft, non-tender, non-distended. Bowel sounds present. No organomegaly or mass.  NEUROLOGIC: Moves all 4 extremities. PSYCHIATRIC: The patient is alert and oriented x 3.  SKIN: No obvious rash, lesion, or ulcer.   DATA REVIEW:   CBC Recent Labs  Lab 08/09/19 0356  WBC 10.7*  HGB 12.9  HCT 39.3  PLT 164    Chemistries  Recent Labs  Lab 08/08/19 2037 08/09/19 0356  NA 136 141  K 3.3* 3.7  CL 96* 105  CO2 28 29  GLUCOSE 191* 166*  BUN 26* 23  CREATININE 0.93 0.95  CALCIUM 9.2  8.6*  AST 13*  --   ALT 13  --   ALKPHOS 55  --   BILITOT 1.5*  --     Cardiac Enzymes No results for input(s): TROPONINI in the last 168 hours.  Microbiology Results  Results for orders placed or performed during the hospital encounter of 08/08/19  Culture, blood (routine x 2)     Status: None (Preliminary result)   Collection Time: 08/08/19  8:37 PM   Specimen: BLOOD  Result Value Ref Range Status   Specimen Description BLOOD BLOOD LEFT FOREARM  Final   Special Requests   Final    BOTTLES DRAWN AEROBIC AND ANAEROBIC Blood Culture adequate volume   Culture   Final    NO GROWTH 3 DAYS Performed at [facility], [address] [place], [place] [address]    Report Status PENDING  Incomplete  Urine culture     Status: Abnormal (Preliminary result)   Collection Time: 08/08/19  8:37 PM   Specimen: Urine, Random  Result Value Ref Range Status   Specimen Description   Final    URINE, RANDOM Performed at [facility], [address], [place], [place] [address]    Special Requests   Final    NONE Performed at [facility], [address], [place], [place] [address]    Culture (A)  Final    >=100,000 COLONIES/mL Lonell Grandchild NEGATIVE RODS REPEATING IDENTIFICATION AND SUSCEPTIBILITIES TO FOLLOW Performed at [facility], [place] [address] [place], [place] [address]    Report Status PENDING  Incomplete  Culture, blood (routine x 2)     Status: Abnormal   Collection Time: 08/08/19  9:54 PM   Specimen: BLOOD  Result Value Ref Range Status   Specimen Description   Final    BLOOD LEFT FATTY CASTS Performed at [facility], [address] [place], [place] [address]    Special Requests   Final    BOTTLES DRAWN AEROBIC AND ANAEROBIC Blood Culture adequate volume Performed at [facility], [address], [place], [place] [address]    Culture  Setup Time   Final    GRAM NEGATIVE RODS AEROBIC BOTTLE ONLY CRITICAL RESULT CALLED TO,  READ BACK BY AND VERIFIED WITH: [facility] HALLAJI AT 9937 08/09/2019 SDR Performed at [facility], [place] [address], [place], [place] [address]    Culture ESCHERICHIA COLI (A)  Final   Report Status 08/11/2019 FINAL  Final   Organism ID, Bacteria ESCHERICHIA COLI  Final      Susceptibility   Escherichia coli - MIC*    AMPICILLIN >=32 RESISTANT Resistant     CEFAZOLIN <=4 SENSITIVE Sensitive     CEFEPIME <=1 SENSITIVE Sensitive     CEFTAZIDIME <=1 SENSITIVE Sensitive     CEFTRIAXONE <=1 SENSITIVE Sensitive     CIPROFLOXACIN <=0.25 SENSITIVE Sensitive     GENTAMICIN <=1 SENSITIVE Sensitive     IMIPENEM <=0.25 SENSITIVE Sensitive     TRIMETH/SULFA >=320 RESISTANT Resistant     AMPICILLIN/SULBACTAM 8 SENSITIVE Sensitive     PIP/TAZO <=4 SENSITIVE Sensitive     Extended ESBL NEGATIVE Sensitive     * ESCHERICHIA COLI  Blood Culture ID Panel (Reflexed)     Status: Abnormal   Collection Time: 08/08/19  9:54 PM  Result Value Ref Range Status   Enterococcus species NOT DETECTED NOT DETECTED Final   Listeria monocytogenes NOT DETECTED NOT DETECTED Final   Staphylococcus species NOT DETECTED NOT DETECTED Final   Staphylococcus aureus (BCID) NOT DETECTED NOT DETECTED Final   Streptococcus species NOT DETECTED NOT DETECTED Final   Streptococcus agalactiae NOT DETECTED NOT DETECTED Final   Streptococcus pneumoniae NOT DETECTED NOT DETECTED Final   Streptococcus pyogenes NOT DETECTED NOT DETECTED Final   Acinetobacter baumannii NOT DETECTED NOT DETECTED Final   Enterobacteriaceae species DETECTED (A) NOT DETECTED Final    Comment: Enterobacteriaceae represent a large family of gram-negative bacteria, not a single organism. CRITICAL RESULT CALLED TO, READ BACK BY AND VERIFIED WITH:  SHEEMA HALLAJI AT 8938 08/09/2019 SDR    Enterobacter cloacae complex NOT DETECTED NOT DETECTED Final   Escherichia coli DETECTED (A) NOT DETECTED Final    Comment: CRITICAL RESULT CALLED TO, READ BACK BY AND  VERIFIED WITH:  SHEEMA HALLAJI AT 1313 08/09/2019  SDR    Klebsiella oxytoca NOT DETECTED NOT DETECTED Final   Klebsiella pneumoniae NOT DETECTED NOT DETECTED Final   Proteus species NOT DETECTED NOT DETECTED Final   Serratia marcescens NOT DETECTED NOT DETECTED Final   Carbapenem resistance NOT DETECTED NOT DETECTED Final   Haemophilus influenzae NOT DETECTED NOT DETECTED Final   Neisseria meningitidis NOT DETECTED NOT DETECTED Final   Pseudomonas aeruginosa NOT DETECTED NOT DETECTED Final   Candida albicans NOT DETECTED NOT DETECTED Final   Candida glabrata NOT DETECTED NOT DETECTED Final   Candida krusei NOT DETECTED NOT DETECTED Final   Candida parapsilosis NOT DETECTED NOT DETECTED Final   Candida tropicalis NOT DETECTED NOT DETECTED Final    Comment: Performed at [facility], [place], [place] [address]  SARS CORONAVIRUS 2 (TAT 6-24 HRS) Nasopharyngeal Nasopharyngeal Swab     Status: None   Collection Time: 08/08/19 10:28 PM   Specimen: Nasopharyngeal Swab  Result Value Ref Range Status   SARS Coronavirus 2 NEGATIVE NEGATIVE Final    Comment: (NOTE) SARS-CoV-2 target nucleic acids are NOT DETECTED. The SARS-CoV-2 RNA is generally detectable in upper and lower respiratory specimens during the acute phase of infection. Negative results do not preclude SARS-CoV-2 infection, do not rule out co-infections with other pathogens, and should not be used as the sole basis for treatment or other patient management decisions. Negative results must be combined with clinical observations, patient history, and epidemiological information. The expected result is Negative. Fact Sheet for Patients: SugarRoll.be Fact Sheet for Healthcare Providers: https://www.woods-mathews.com/ This test is not yet approved or cleared by the [place] FDA and  has been authorized for detection and/or diagnosis of SARS-CoV-2 by FDA under an  Emergency Use Authorization (EUA). This EUA will remain  in effect (meaning this test can be used) for the duration of the COVID-19 declaration under Section 56 4(b)(1) of the Act, 21 U.S.C. section 360bbb-3(b)(1), unless the authorization is terminated or revoked sooner. Performed at [facility], [place] [address], [place], [place] [address]     RADIOLOGY:  No results found.  Follow up with PCP in 1 week.  Management plans discussed with the patient, family and they are in agreement.  CODE STATUS: Full code    Code Status Orders  (From admission, onward)         Start     Ordered   08/09/19 0036  Full code  Continuous     08/09/19 0035        Code Status History    Date Active Date Inactive Code Status Order ID Comments User Context   01/11/2019 1553 01/15/2019 2038 Full Code [id number]  Gorden Harms, MD ED   05/13/2015 1528 05/13/2015 1910 Full Code [id number]  Schnier, Dolores Lory, MD Inpatient   Advance Care Planning Activity    Advance Directive Documentation     Most Recent Value  Type of Advance Directive  Healthcare Power of [place], Living will  Pre-existing out of facility DNR order (yellow form or pink MOST form)  -  "MOST" Form in Place?  -      TOTAL TIME TAKING CARE OF THIS PATIENT ON DAY OF DISCHARGE: more than 35 minutes.   Saundra Shelling M.D on 08/11/2019 at 11:31 AM  Between 7am to 6pm - Pager - [phone]  After 6pm go to [email or web address] - password [email or web address] [facility]  Office  [phone]  CC: Primary care physician; [place], [employer], PA-C  Note: This dictation was prepared with Dragon dictation along with smaller phrase technology. Any transcriptional errors that result from this process are unintentional.

## 2019-08-12 LAB — URINE CULTURE: Culture: >=100000 — AB

## 2019-08-13 LAB — CULTURE, BLOOD (ROUTINE X 2)
Culture: NO GROWTH
Special Requests: ADEQUATE
Special Requests: ADEQUATE

## 2019-08-24 ENCOUNTER — Ambulatory Visit
Admission: RE | Admit: 2019-08-24 | Discharge: 2019-08-24 | Disposition: A | Discharge status: Home / Self Care | Payer: Medicare Other | Source: Ambulatory Visit | Attending: Family Medicine | Admitting: Family Medicine

## 2019-08-24 DIAGNOSIS — Z1231 Encounter for screening mammogram for malignant neoplasm of breast: Secondary | ICD-10-CM | POA: Insufficient documentation

## 2019-09-18 ENCOUNTER — Inpatient Hospital Stay
Admission: EM | Admit: 2019-09-18 | Discharge: 2019-09-21 | DRG: 229 | Disposition: A | Discharge status: Nursing Home (Includes ICF) | Payer: Medicare Other | Source: Ambulatory Visit | Attending: Internal Medicine | Admitting: Internal Medicine

## 2019-09-18 ENCOUNTER — Emergency Department: Payer: Medicare Other

## 2019-09-18 ENCOUNTER — Encounter: Payer: Self-pay | Admitting: Emergency Medicine

## 2019-09-18 ENCOUNTER — Other Ambulatory Visit: Payer: Self-pay

## 2019-09-18 DIAGNOSIS — Z96642 Presence of left artificial hip joint: Secondary | ICD-10-CM | POA: Diagnosis present

## 2019-09-18 DIAGNOSIS — I441 Atrioventricular block, second degree: Secondary | ICD-10-CM | POA: Diagnosis not present

## 2019-09-18 DIAGNOSIS — Z7984 Long term (current) use of oral hypoglycemic drugs: Secondary | ICD-10-CM | POA: Diagnosis not present

## 2019-09-18 DIAGNOSIS — E1140 Type 2 diabetes mellitus with diabetic neuropathy, unspecified: Secondary | ICD-10-CM | POA: Diagnosis present

## 2019-09-18 DIAGNOSIS — Z7983 Long term (current) use of bisphosphonates: Secondary | ICD-10-CM | POA: Diagnosis not present

## 2019-09-18 DIAGNOSIS — Z79890 Hormone replacement therapy (postmenopausal): Secondary | ICD-10-CM

## 2019-09-18 DIAGNOSIS — I10 Essential (primary) hypertension: Secondary | ICD-10-CM | POA: Diagnosis present

## 2019-09-18 DIAGNOSIS — Z20828 Contact with and (suspected) exposure to other viral communicable diseases: Secondary | ICD-10-CM | POA: Diagnosis present

## 2019-09-18 DIAGNOSIS — D6859 Other primary thrombophilia: Secondary | ICD-10-CM | POA: Diagnosis present

## 2019-09-18 DIAGNOSIS — Z885 Allergy status to narcotic agent status: Secondary | ICD-10-CM | POA: Diagnosis not present

## 2019-09-18 DIAGNOSIS — I442 Atrioventricular block, complete: Secondary | ICD-10-CM | POA: Diagnosis present

## 2019-09-18 DIAGNOSIS — Z006 Encounter for examination for normal comparison and control in clinical research program: Secondary | ICD-10-CM | POA: Diagnosis not present

## 2019-09-18 DIAGNOSIS — E119 Type 2 diabetes mellitus without complications: Secondary | ICD-10-CM

## 2019-09-18 DIAGNOSIS — R001 Bradycardia, unspecified: Secondary | ICD-10-CM

## 2019-09-18 DIAGNOSIS — Z881 Allergy status to other antibiotic agents status: Secondary | ICD-10-CM

## 2019-09-18 DIAGNOSIS — Z7901 Long term (current) use of anticoagulants: Secondary | ICD-10-CM

## 2019-09-18 DIAGNOSIS — E039 Hypothyroidism, unspecified: Secondary | ICD-10-CM | POA: Diagnosis present

## 2019-09-18 DIAGNOSIS — R55 Syncope and collapse: Secondary | ICD-10-CM | POA: Diagnosis present

## 2019-09-18 DIAGNOSIS — Z86718 Personal history of other venous thrombosis and embolism: Secondary | ICD-10-CM

## 2019-09-18 DIAGNOSIS — E669 Obesity, unspecified: Secondary | ICD-10-CM | POA: Diagnosis present

## 2019-09-18 DIAGNOSIS — I2510 Atherosclerotic heart disease of native coronary artery without angina pectoris: Secondary | ICD-10-CM | POA: Diagnosis present

## 2019-09-18 DIAGNOSIS — Z79899 Other long term (current) drug therapy: Secondary | ICD-10-CM

## 2019-09-18 DIAGNOSIS — E876 Hypokalemia: Secondary | ICD-10-CM | POA: Diagnosis present

## 2019-09-18 DIAGNOSIS — Z87891 Personal history of nicotine dependence: Secondary | ICD-10-CM | POA: Diagnosis not present

## 2019-09-18 DIAGNOSIS — F419 Anxiety disorder, unspecified: Secondary | ICD-10-CM | POA: Diagnosis present

## 2019-09-18 DIAGNOSIS — R079 Chest pain, unspecified: Secondary | ICD-10-CM

## 2019-09-18 LAB — BASIC METABOLIC PANEL
Anion gap: 10 (ref 5–15)
Anion gap: 9 (ref 5–15)
BUN: 34 mg/dL — ABNORMAL HIGH (ref 8–23)
BUN: 33 mg/dL — ABNORMAL HIGH (ref 8–23)
CO2: 22 mmol/L (ref 22–32)
CO2: 24 mmol/L (ref 22–32)
Calcium: 9.1 mg/dL (ref 8.9–10.3)
Calcium: 9.2 mg/dL (ref 8.9–10.3)
Chloride: 110 mmol/L (ref 98–111)
Chloride: 108 mmol/L (ref 98–111)
Creatinine, Ser: 1.06 mg/dL — ABNORMAL HIGH (ref 0.44–1.00)
Creatinine, Ser: 1.03 mg/dL — ABNORMAL HIGH (ref 0.44–1.00)
GFR calc Af Amer: 57 mL/min — ABNORMAL LOW (ref >60)
GFR calc Af Amer: 59 mL/min — ABNORMAL LOW (ref >60)
GFR calc non Af Amer: 49 mL/min — ABNORMAL LOW (ref >60)
GFR calc non Af Amer: 51 mL/min — ABNORMAL LOW (ref >60)
Glucose, Bld: 109 mg/dL — ABNORMAL HIGH (ref 70–99)
Glucose, Bld: 190 mg/dL — ABNORMAL HIGH (ref 70–99)
Potassium: 3.4 mmol/L — ABNORMAL LOW (ref 3.5–5.1)
Potassium: 3.3 mmol/L — ABNORMAL LOW (ref 3.5–5.1)
Sodium: 142 mmol/L (ref 135–145)
Sodium: 141 mmol/L (ref 135–145)

## 2019-09-18 LAB — SARS CORONAVIRUS 2 (TAT 6-24 HRS): SARS Coronavirus 2: NEGATIVE

## 2019-09-18 LAB — MAGNESIUM
Magnesium: 1.6 mg/dL — ABNORMAL LOW (ref 1.7–2.4)
Magnesium: 2.2 mg/dL (ref 1.7–2.4)

## 2019-09-18 LAB — GLUCOSE, CAPILLARY
Glucose-Capillary: 194 mg/dL — ABNORMAL HIGH (ref 70–99)
Glucose-Capillary: 136 mg/dL — ABNORMAL HIGH (ref 70–99)

## 2019-09-18 LAB — PROTIME-INR
INR: 2.5 — ABNORMAL HIGH (ref 0.8–1.2)
Prothrombin Time: 26.3 seconds — ABNORMAL HIGH (ref 11.4–15.2)

## 2019-09-18 LAB — CBC
HCT: 40.1 % (ref 36.0–46.0)
Hemoglobin: 12.8 g/dL (ref 12.0–15.0)
MCH: 28.7 pg (ref 26.0–34.0)
MCHC: 31.9 g/dL (ref 30.0–36.0)
MCV: 89.9 fL (ref 80.0–100.0)
Platelets: 205 10*3/uL (ref 150–400)
RBC: 4.46 MIL/uL (ref 3.87–5.11)
RDW: 15.3 % (ref 11.5–15.5)
WBC: 8.4 10*3/uL (ref 4.0–10.5)
nRBC: 0 % (ref 0.0–0.2)

## 2019-09-18 LAB — TROPONIN I (HIGH SENSITIVITY)
Troponin I (High Sensitivity): 10 ng/L (ref <18)
Troponin I (High Sensitivity): 10 ng/L (ref <18)

## 2019-09-18 LAB — TSH: TSH: 1.148 u[IU]/mL (ref 0.350–4.500)

## 2019-09-18 LAB — T4, FREE: Free T4: 0.9 ng/dL (ref 0.61–1.12)

## 2019-09-18 MED ORDER — INSULIN ASPART 100 UNIT/ML ~~LOC~~ SOLN
0.0000 [IU] | Freq: Three times a day (TID) | SUBCUTANEOUS | Status: DC
  Administered 2019-09-18: 3 [IU] via SUBCUTANEOUS
  Administered 2019-09-19 – 2019-09-20 (×2): 2 [IU] via SUBCUTANEOUS
  Administered 2019-09-21 (×2): 3 [IU] via SUBCUTANEOUS
  Filled 2019-09-18 (×4): qty 1

## 2019-09-18 MED ORDER — CHLORHEXIDINE GLUCONATE CLOTH 2 % EX PADS: 6.0000 | MEDICATED_PAD | Freq: Every day | CUTANEOUS | Status: DC

## 2019-09-18 MED ORDER — INSULIN ASPART 100 UNIT/ML ~~LOC~~ SOLN
SUBCUTANEOUS | Status: AC
  Filled 2019-09-18: qty 1

## 2019-09-18 MED ORDER — RISAQUAD PO CAPS
1.0000 | ORAL_CAPSULE | Freq: Every day | ORAL | Status: DC
  Administered 2019-09-18 – 2019-09-21 (×3): 1 via ORAL
  Filled 2019-09-18 (×4): qty 1

## 2019-09-18 MED ORDER — ACETAMINOPHEN 650 MG RE SUPP: 650.0000 mg | Freq: Four times a day (QID) | RECTAL | Status: DC | PRN

## 2019-09-18 MED ORDER — ONDANSETRON HCL 4 MG PO TABS: 4.0000 mg | ORAL_TABLET | Freq: Four times a day (QID) | ORAL | Status: DC | PRN

## 2019-09-18 MED ORDER — LOPERAMIDE HCL 2 MG PO CAPS
2.0000 mg | ORAL_CAPSULE | Freq: Every day | ORAL | Status: DC
  Administered 2019-09-18 – 2019-09-21 (×3): 2 mg via ORAL
  Filled 2019-09-18 (×3): qty 1

## 2019-09-18 MED ORDER — DOCUSATE SODIUM 100 MG PO CAPS
100.0000 mg | ORAL_CAPSULE | Freq: Two times a day (BID) | ORAL | Status: DC
  Filled 2019-09-18: qty 1

## 2019-09-18 MED ORDER — ACETAMINOPHEN 325 MG PO TABS
650.0000 mg | ORAL_TABLET | Freq: Four times a day (QID) | ORAL | Status: DC | PRN
  Administered 2019-09-20: 650 mg via ORAL

## 2019-09-18 MED ORDER — INSULIN ASPART 100 UNIT/ML ~~LOC~~ SOLN: 0.0000 [IU] | Freq: Every day | SUBCUTANEOUS | Status: DC

## 2019-09-18 MED ORDER — SACCHAROMYCES BOULARDII 250 MG PO CAPS
250.0000 mg | ORAL_CAPSULE | Freq: Every day | ORAL | Status: DC
  Administered 2019-09-18 – 2019-09-21 (×3): 250 mg via ORAL
  Filled 2019-09-18 (×4): qty 1

## 2019-09-18 MED ORDER — DULOXETINE HCL 30 MG PO CPEP
60.0000 mg | ORAL_CAPSULE | Freq: Every day | ORAL | Status: DC
  Administered 2019-09-18 – 2019-09-20 (×3): 60 mg via ORAL
  Filled 2019-09-18 (×2): qty 2
  Filled 2019-09-18 (×3): qty 1

## 2019-09-18 MED ORDER — LEVOTHYROXINE SODIUM 88 MCG PO TABS
88.0000 ug | ORAL_TABLET | Freq: Every day | ORAL | Status: DC
  Administered 2019-09-19 – 2019-09-21 (×3): 88 ug via ORAL
  Filled 2019-09-18 (×3): qty 1

## 2019-09-18 MED ORDER — MESALAMINE 1.2 G PO TBEC
1.2000 g | DELAYED_RELEASE_TABLET | Freq: Every day | ORAL | Status: DC
  Administered 2019-09-19 – 2019-09-21 (×2): 1.2 g via ORAL
  Filled 2019-09-18 (×3): qty 1

## 2019-09-18 MED ORDER — GABAPENTIN 100 MG PO CAPS
100.0000 mg | ORAL_CAPSULE | Freq: Every day | ORAL | Status: DC
  Administered 2019-09-18 – 2019-09-20 (×3): 100 mg via ORAL
  Filled 2019-09-18 (×3): qty 1

## 2019-09-18 MED ORDER — POTASSIUM CHLORIDE CRYS ER 20 MEQ PO TBCR
40.0000 meq | EXTENDED_RELEASE_TABLET | ORAL | Status: AC
  Administered 2019-09-18 (×2): 40 meq via ORAL
  Filled 2019-09-18 (×2): qty 2

## 2019-09-18 MED ORDER — HYDROCORTISONE (PERIANAL) 2.5 % EX CREA
1.0000 "application " | TOPICAL_CREAM | Freq: Two times a day (BID) | CUTANEOUS | Status: DC
  Administered 2019-09-20: 1 via TOPICAL
  Filled 2019-09-18: qty 28.35

## 2019-09-18 MED ORDER — MAGNESIUM SULFATE 2 GM/50ML IV SOLN
2.0000 g | Freq: Once | INTRAVENOUS | Status: AC
  Administered 2019-09-18: 2 g via INTRAVENOUS
  Filled 2019-09-18: qty 50

## 2019-09-18 MED ORDER — ATORVASTATIN CALCIUM 20 MG PO TABS
40.0000 mg | ORAL_TABLET | Freq: Every day | ORAL | Status: DC
  Administered 2019-09-19 – 2019-09-21 (×2): 40 mg via ORAL
  Filled 2019-09-18 (×2): qty 2

## 2019-09-18 MED ORDER — ONDANSETRON HCL 4 MG/2ML IJ SOLN: 4.0000 mg | Freq: Four times a day (QID) | INTRAMUSCULAR | Status: DC | PRN

## 2019-09-18 NOTE — Consult Note (Addendum)
Complete heart block symptomatic Date of admission 09/18/2019 Referring physician Dr. Marlowe Sax hospitalist Indication congestive heart failure and symptomatic bradycardia  HPI Patient is a 83 year old female history of bradycardia now presents with complete heart block by primary physician who referred the patient for further cardiac assessment patient has a history of hypercoagulable state and is been on Coumadin therapy last INR 2.5 denies any bleeding denies any chest pain heart block in the 30s was identified by her primary cardiologist Dr. Saralyn Pilar.  He will be referred to the emergency room for possible admission and permanent pacemaker placement.  Patient complains of lightheaded dizziness but denies any frank syncope denies any palpitations or tachycardia  Past medical  hypercoagulable state Protein C protein S deficiency Diabetes Hypertension I have turned Obesity DJD  Family history Hypertension Obesity  Social history Married No smoking Retired Originally from [place]  Medications  Coumadin Insulin Lipitor Cymbalta Atenolol   Allergies   Review of systems Constitutional negative Eyes benign Ears normal hearing Respiratory no significant shortness of breath Cardiovascular bradycardia no palpitations or tachycardia Neurologic benign Musculoskeletal DJD  Physical exam HEENT normocephalic/atraumatic Neck exam supple no significant JVD Lungs are clear to auscultation percussion no wheezes rhonchi or rails Heart bradycardic systolic regular Abdominal exam was benign positive bowel sounds Extremity within normal limits Neurologic exam grossly intact  Laboratories EKG sinus bradycardia complete heart block rates at 35 Chest exam benign CBC within normal limits PT/INR 2.5 Chemistries benign Troponin negative  Indication Complete heart block Bradycardia Diabetes Hyperlipidemia Mild obesity Hypercoagulable state DJD  Plan Agree with admit  rule out myocardial infarction Maintain telemetry follow-up EKGs troponins Hold Coumadin to help reduce INR Preop for permanent pacemaker DDD Hold atenolol because of bradycardia complete hospital Consider hematology consult consideration for vitamin K so we can place permanent pacemaker within the next 24 to 48 hours Echocardiogram will be helpful for further assessment evaluation Continue diabetes management and control Agree with Lipitor therapy for lipid management

## 2019-09-18 NOTE — ED Provider Notes (Signed)
[facility] Emergency Department Provider Note   ____________________________________________   First MD Initiated Contact with Patient 09/18/19 1200     (approximate)  I have reviewed the triage vital signs and the nursing notes.   HISTORY  Chief Complaint    HPI Sierra [name] is a 83 y.o. 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  Patient reports that she initially felt very lightheaded about 2 weeks ago and subsequently had a syncopal episode while sitting on the couch.  Since then, she has had intermittent episodes of dizziness and lightheadedness, which can occur at rest or when she is moving.  She has not lost consciousness again and denies any associated chest pain or shortness of breath.  She was seen at her PCPs office today and noted to be in complete heart block, subsequently referred to cardiology, who then referred the patient to the ED.  Patient currently denies any lightheadedness or dizziness.        Past Medical History:  Diagnosis Date  . Anginal pain ([place])   . Anxiety   . Diabetes mellitus without complication ([place])   . Hypertension   . Hypothyroidism   . Protein C deficiency ([place])   . Protein S deficiency [facility])     Patient Active Problem List   Diagnosis Date Noted  . Complete heart block ([place]) 09/18/2019  . Sepsis ([place]) 08/08/2019  . UTI (urinary tract infection) 08/08/2019  . Hypothyroidism 08/08/2019  . HTN (hypertension) 08/08/2019  . Diabetes ([place]) 08/08/2019  . Protein C deficiency ([place]) 08/08/2019  . Protein S deficiency ([place]) 08/08/2019  . Hip fracture ([place]) 01/11/2019    Past Surgical History:  Procedure Laterality Date  . BACK SURGERY    . BREAST EXCISIONAL BIOPSY Left 2003?   benign  . COLONOSCOPY N/A 09/12/2017   Procedure: COLONOSCOPY;  Surgeon: Lollie Sails, MD;  Location: [facility]  ENDOSCOPY;  Service: Endoscopy;  Laterality: N/A;  . COLONOSCOPY WITH PROPOFOL N/A 01/20/2016   Procedure: COLONOSCOPY WITH PROPOFOL;  Surgeon: Lollie Sails, MD;  Location: [facility] ENDOSCOPY;  Service: Endoscopy;  Laterality: N/A;  . FRACTURE SURGERY    . HIP ARTHROPLASTY Left 01/12/2019   Procedure: ARTHROPLASTY BIPOLAR HIP (HEMIARTHROPLASTY), LEFT;  Surgeon: Leim Fabry, MD;  Location: [facility] ORS;  Service: Orthopedics;  Laterality: Left;  . PERIPHERAL VASCULAR CATHETERIZATION N/A 05/13/2015   Procedure: IVC Filter Removal;  Surgeon: Katha Cabal, MD;  Location: [place] CV LAB;  Service: Cardiovascular;  Laterality: N/A;  . TONSILLECTOMY      Prior to Admission medications   Medication Sig Start Date End Date Taking? Authorizing Provider  alendronate (FOSAMAX) 70 MG tablet Take 70 mg by mouth once a week. 11/20/18  Yes [provider]  amitriptyline (ELAVIL) 10 MG tablet Take 10 mg by mouth at bedtime as needed for sleep. 12/08/18  Yes [provider]  amLODipine (NORVASC) 2.5 MG tablet Take 2.5 mg by mouth daily.   Yes [provider]  APRISO 0.375 g 24 hr capsule Take 1.5 g by mouth daily. 11/26/18  Yes [provider]  atenolol (TENORMIN) 25 MG tablet Take 25 mg by mouth daily.   Yes [provider]  atorvastatin (LIPITOR) 40 MG tablet Take 40 mg by mouth daily.   Yes [provider]  calcium carbonate (OSCAL) 1500 (600 Ca) MG TABS tablet Take 600 mg of elemental calcium by  mouth daily.    Yes [provider]  cholecalciferol (VITAMIN D3) 25 MCG (1000 UT) tablet Take 3,000 Units by mouth daily. (taken with calcium)   Yes [provider]  DULoxetine (CYMBALTA) 60 MG capsule Take 60 mg by mouth at bedtime.    Yes [provider]  gabapentin (NEURONTIN) 100 MG capsule Take 100 mg by mouth at bedtime. 08/23/19  Yes [provider]  glipiZIDE (GLUCOTROL XL) 2.5 MG 24 hr tablet Take 2.5 mg by mouth  daily. 11/11/18  Yes [provider]  hydrocortisone (PROCTOSOL HC) 2.5 % rectal cream Apply 1 application topically 2 (two) times daily. 06/19/19  Yes [provider]  levothyroxine (SYNTHROID, LEVOTHROID) 88 MCG tablet Take 88 mcg by mouth daily before breakfast.   Yes [provider]  lisinopril (PRINIVIL,ZESTRIL) 40 MG tablet Take 40 mg by mouth daily. 12/05/18  Yes [provider]  sitaGLIPtin-metformin (JANUMET) 50-500 MG per tablet Take 2 tablets by mouth every evening.    Yes [provider]  warfarin (COUMADIN) 5 MG tablet Take 5 mg by mouth daily. Tuesday and Thursday   Yes [provider]  warfarin (COUMADIN) 6 MG tablet Take 6 mg by mouth as directed.    Yes [provider]  loperamide (IMODIUM A-D) 2 MG tablet Take 2 mg by mouth daily.    [provider]  Probiotic Product ([place]) CAPS Take 1 capsule by mouth daily.    [provider]  saccharomyces boulardii (FLORASTOR) 250 MG capsule Take 250 mg by mouth daily.    [provider]    Allergies Gentamicin and Morphine and related  Family History  Problem Relation Age of Onset  . Breast cancer Sister     Social History Social History   Tobacco Use  . Smoking status: Former Research scientist (life sciences)  . Smokeless tobacco: Never Used  Substance Use Topics  . Alcohol use: No  . Drug use: No    Review of Systems  Constitutional: No fever/chills Eyes: No visual changes. ENT: No sore throat. Cardiovascular: Denies chest pain. Respiratory: Denies shortness of breath. Gastrointestinal: No abdominal pain.  No nausea, no vomiting.  No diarrhea.  No constipation. Genitourinary: Negative for dysuria. Musculoskeletal: Negative for back pain. Skin: Negative for rash. Neurological: Negative for headaches, focal weakness or numbness.  Positive for lightheadedness and dizziness.  ____________________________________________   PHYSICAL EXAM:   VITAL SIGNS: ED Triage Vitals [09/18/19 1151]  Enc Vitals Group     BP 137/61     Pulse Rate (!) 38     Resp 16     Temp 98.6 F (37 C)     Temp Source Oral     SpO2 95 %     Weight      Height      Head Circumference      Peak Flow      Pain Score 0     Pain Loc      Pain Edu?      Excl. in [place]?     Constitutional: Alert and oriented. Eyes: Conjunctivae are normal. Head: Atraumatic. Nose: No congestion/rhinnorhea. Mouth/Throat: Mucous membranes are moist. Neck: Normal ROM Cardiovascular: Normal rate, regular rhythm. Grossly normal heart sounds. Respiratory: Normal respiratory effort.  No retractions. Lungs CTAB. Gastrointestinal: Soft and nontender. No distention. Genitourinary: deferred Musculoskeletal: No lower extremity tenderness nor edema. Neurologic:  Normal speech and language. No gross focal neurologic deficits are appreciated. Skin:  Skin is warm, dry and intact. No  rash noted. Psychiatric: Mood and affect are normal. Speech and behavior are normal.  ____________________________________________   LABS (all labs ordered are listed, but only abnormal results are displayed)  Labs Reviewed  BASIC METABOLIC PANEL - Abnormal; Notable for the following components:      Result Value   Potassium 3.4 (*)    Glucose, Bld 109 (*)    BUN 34 (*)    Creatinine, Ser 1.06 (*)    GFR calc non Af Amer 49 (*)    GFR calc Af Amer 57 (*)    All other components within normal limits  PROTIME-INR - Abnormal; Notable for the following components:   Prothrombin Time 26.3 (*)    INR 2.5 (*)    All other components within normal limits  MAGNESIUM - Abnormal; Notable for the following components:   Magnesium 1.6 (*)    All other components within normal limits  GLUCOSE, CAPILLARY - Abnormal; Notable for the following components:   Glucose-Capillary 194 (*)    All other components within normal limits  SARS CORONAVIRUS 2 (TAT 6-24 HRS)  CBC  BASIC METABOLIC PANEL  MAGNESIUM   TSH  T4, FREE  TROPONIN I (HIGH SENSITIVITY)  TROPONIN I (HIGH SENSITIVITY)   ____________________________________________  EKG  ED ECG REPORT I, Blake Divine, the attending physician, personally viewed and interpreted this ECG.   Date: 09/18/2019  EKG Time: 11:48  Rate: 38  Rhythm: Complete Heart Block  Axis: Normal  Intervals:complete heart block  ST&T Change: None   PROCEDURES  Procedure(s) performed (including Critical Care):  .Critical Care Performed by: Blake Divine, MD Authorized by: Blake Divine, MD   Critical care provider statement:    Critical care time (minutes):  45   Critical care time was exclusive of:  Separately billable procedures and treating other patients and teaching time   Critical care was necessary to treat or prevent imminent or life-threatening deterioration of the following conditions:  Cardiac failure   Critical care was time spent personally by me on the following activities:  Discussions with consultants, evaluation of patient's response to treatment, examination of patient, ordering and performing treatments and interventions, ordering and review of laboratory studies, ordering and review of radiographic studies, pulse oximetry, re-evaluation of patient's condition, obtaining history from patient or surrogate and review of old charts   I assumed direction of critical care for this patient from another provider in my specialty: no       ____________________________________________   INITIAL IMPRESSION / [place] / ED COURSE       83 year old female presents to the ED with a syncopal episode 2 weeks ago and episodes of feeling lightheaded intermittently since then.  She was noted on outpatient EKG to have complete heart block and EKG again today shows similar.  There are no acute ischemic changes and patient denies any chest pain or shortness of breath.  She is hemodynamically stable and normotensive at this time, will  continue to monitor.  Case was discussed with Dr. Clayborn Bigness of cardiology, who will evaluate the patient and determine need to reverse her Coumadin versus wait for her INR to decrease with Coumadin held.  Labs significant only for hypomagnesemia, renal function is comparable to prior.  Case discussed with hospitalist, who accepts patient for admission.      ____________________________________________   FINAL CLINICAL IMPRESSION(S) / ED DIAGNOSES  Final diagnoses:  Complete heart block ([name])  Protein C deficiency ([name])  Syncope, unspecified syncope type  ED Discharge Orders    None       Note:  This document was prepared using Dragon voice recognition software and may include unintentional dictation errors.   Blake Divine, MD 09/18/19 [phone]

## 2019-09-18 NOTE — ED Notes (Signed)
Given lunch tray and drink.

## 2019-09-18 NOTE — Progress Notes (Signed)
eLink Physician-Brief Progress Note Patient Name: Sierra [name] DOB: 24-May-1936 MRN: [id number]   Date of Service  09/18/2019  HPI/Events of Note  Pt admitted with hemodynamically stable third degree heart block. Cardiology plans to see Pt in a.m.  eICU Interventions  New patient evaluation completed.        [name] 09/18/2019, 11:02 PM

## 2019-09-18 NOTE — ED Notes (Signed)
Given dinner tray

## 2019-09-18 NOTE — ED Triage Notes (Signed)
Pt from cardiologist office, states pt is in complete heart block. Pt Hr 30s. Pt denies any CP. BP stable. NAD noted

## 2019-09-18 NOTE — ED Notes (Signed)
Pt placed on Zoll pads at bedside.

## 2019-09-18 NOTE — ED Notes (Signed)
[name], RN ICU (Report provided)

## 2019-09-18 NOTE — ED Notes (Signed)
Pt ate all of dinner tray. 

## 2019-09-18 NOTE — H&P (Signed)
[facility] History and Physical   Patient: Sierra [name] WJX:[id number]   PCP: Donnamarie Rossetti, PA-C DOB: Nov 07, 1936   DOA: 09/18/2019   DOS: 09/18/2019   DOS: the patient was seen and examined on 09/18/2019  Patient coming from: The patient is coming from Home  Chief Complaint: dizziness   HPI: Sierra [name] is a 83 y.o. female with Past medical history of CAD, protein CNS deficiency, HTN, hypothyroidism, type 2 diabetes mellitus, anxiety. Patient presents with complaints of reports of dizziness. She mentions that for last 1 week she has been having 2 episodes of dizziness with near syncope. Denies any chest pain denies any chest heaviness chest tightness or palpitation retention no fever no chills sent nausea no vomiting no diarrhea no constipation pretension no focal deficit. She denies any recent change in medications. She mentions he is compliant with all her medications. With this complaint she went to see her PCPs office who did an EKG and because it showed complete heart block patient was referred to cardiology's office and from cardiologist office patient was referred to [facility] ER.  ED Course: Presents with complete heart block in the ER. Cardiology was consulted.  Due to elevated INR and as the patient was asymptomatic at rest cardiology recommended observation and they will consult tomorrow.  At her baseline ambulates with assistance independent for most of her ADL;  manages her medication on her own.  Review of Systems: as mentioned in the history of present illness.  All other systems reviewed and are negative.  Past Medical History:  Diagnosis Date  . Anginal pain ([place])   . Anxiety   . Diabetes mellitus without complication ([place])   . Hypertension   . Hypothyroidism   . Protein C deficiency ([place])   . Protein S deficiency [facility])    Past Surgical History:  Procedure Laterality Date  . BACK SURGERY    . BREAST EXCISIONAL BIOPSY Left 2003?   benign  .  COLONOSCOPY N/A 09/12/2017   Procedure: COLONOSCOPY;  Surgeon: Lollie Sails, MD;  Location[facility] ENDOSCOPY;  Service: Endoscopy;  Laterality: N/A;  . COLONOSCOPY WITH PROPOFOL N/A 01/20/2016   Procedure: COLONOSCOPY WITH PROPOFOL;  Surgeon: Lollie Sails, MD;  Location: [facility] ENDOSCOPY;  Service: Endoscopy;  Laterality: N/A;  . FRACTURE SURGERY    . HIP ARTHROPLASTY Left 01/12/2019   Procedure: ARTHROPLASTY BIPOLAR HIP (HEMIARTHROPLASTY), LEFT;  Surgeon: Leim Fabry, MD;  Location: [facility] ORS;  Service: Orthopedics;  Laterality: Left;  . PERIPHERAL VASCULAR CATHETERIZATION N/A 05/13/2015   Procedure: IVC Filter Removal;  Surgeon: Katha Cabal, MD;  Location: [place] CV LAB;  Service: Cardiovascular;  Laterality: N/A;  . TONSILLECTOMY     Social History:  reports that she has quit smoking. She has never used smokeless tobacco. She reports that she does not drink alcohol or use drugs.  Allergies  Allergen Reactions  . Gentamicin Other (See Comments)    Kidney function  . Morphine And Related Other (See Comments)    AMS - agitation and delusions **No legal documents to be signed if taking, per family**   Family history reviewed and not pertinent Family History  Problem Relation Age of Onset  . Breast cancer Sister      Prior to Admission medications   Medication Sig Start Date End Date Taking? Authorizing Provider  alendronate (FOSAMAX) 70 MG tablet Take 70 mg by mouth once a week. 11/20/18  Yes [provider]  amitriptyline (ELAVIL) 10 MG tablet Take 10 mg  by mouth at bedtime as needed for sleep. 12/08/18  Yes [provider]  amLODipine (NORVASC) 2.5 MG tablet Take 2.5 mg by mouth daily.   Yes [provider]  APRISO 0.375 g 24 hr capsule Take 1.5 g by mouth daily. 11/26/18  Yes [provider]  atenolol (TENORMIN) 25 MG tablet Take 25 mg by mouth daily.   Yes [provider]  atorvastatin (LIPITOR) 40 MG tablet Take 40 mg by  mouth daily.   Yes [provider]  calcium carbonate (OSCAL) 1500 (600 Ca) MG TABS tablet Take 600 mg of elemental calcium by mouth daily.    Yes [provider]  cholecalciferol (VITAMIN D3) 25 MCG (1000 UT) tablet Take 3,000 Units by mouth daily. (taken with calcium)   Yes [provider]  DULoxetine (CYMBALTA) 60 MG capsule Take 60 mg by mouth at bedtime.    Yes [provider]  gabapentin (NEURONTIN) 100 MG capsule Take 100 mg by mouth at bedtime. 08/23/19  Yes [provider]  glipiZIDE (GLUCOTROL XL) 2.5 MG 24 hr tablet Take 2.5 mg by mouth daily. 11/11/18  Yes [provider]  hydrocortisone (PROCTOSOL HC) 2.5 % rectal cream Apply 1 application topically 2 (two) times daily. 06/19/19  Yes [provider]  levothyroxine (SYNTHROID, LEVOTHROID) 88 MCG tablet Take 88 mcg by mouth daily before breakfast.   Yes [provider]  lisinopril (PRINIVIL,ZESTRIL) 40 MG tablet Take 40 mg by mouth daily. 12/05/18  Yes [provider]  sitaGLIPtin-metformin (JANUMET) 50-500 MG per tablet Take 2 tablets by mouth every evening.    Yes [provider]  warfarin (COUMADIN) 5 MG tablet Take 5 mg by mouth daily. Tuesday and Thursday   Yes [provider]  warfarin (COUMADIN) 6 MG tablet Take 6 mg by mouth as directed.    Yes [provider]  loperamide (IMODIUM A-D) 2 MG tablet Take 2 mg by mouth daily.    [provider]  Probiotic Product ([place]) CAPS Take 1 capsule by mouth daily.    [provider]  saccharomyces boulardii (FLORASTOR) 250 MG capsule Take 250 mg by mouth daily.    [provider]    Physical Exam: Vitals:   09/18/19 1645 09/18/19 1730 09/18/19 1747 09/18/19 1830  BP: (!) 145/68 (!) 114/56 (!) 141/85 140/62  Pulse: (!) 36 (!) 36 (!) 36 (!) 44  Resp: 17 17 16 16   Temp:      TempSrc:      SpO2: 95% 97% 95% 99%    General: alert and  oriented to time, place, and person. Appear in mild distress, affect appropriate Eyes: PERRL, Conjunctiva normal ENT: Oral Mucosa Clear, moist  Neck: no JVD, no Abnormal Mass Or lumps Cardiovascular: S1 and S2 Present, no Murmur, peripheral pulses symmetrical Respiratory: good respiratory effort, Bilateral Air entry equal and Decreased, no signs of accessory muscle use, Clear to Auscultation, no Crackles, no wheezes Abdomen: Bowel Sound present, Soft and no tenderness, no hernia Skin: no rashes  Extremities: no Pedal edema, no calf tenderness Neurologic: without any new focal findings Gait not checked due to patient safety concerns  Data Reviewed: I have personally reviewed and interpreted labs, imaging as discussed below.  CBC: Recent Labs  Lab 09/18/19 1153  WBC 8.4  HGB 12.8  HCT 40.1  MCV 89.9  PLT 810   Basic Metabolic Panel: Recent Labs  Lab 09/18/19 1153 09/18/19 1741  NA 142 141  K 3.4* 3.3*  CL 110 108  CO2 22 24  GLUCOSE 109* 190*  BUN 34* 33*  CREATININE 1.06* 1.03*  CALCIUM 9.1 9.2  MG 1.6* 2.2   GFR: CrCl cannot be calculated (Unknown ideal weight.). Liver Function Tests: No results for input(s): AST, ALT, ALKPHOS, BILITOT, PROT, ALBUMIN in the last 168 hours. No results for input(s): LIPASE, AMYLASE in the last 168 hours. No results for input(s): AMMONIA in the last 168 hours. Coagulation Profile: Recent Labs  Lab 09/18/19 1153  INR 2.5*   Cardiac Enzymes: No results for input(s): CKTOTAL, CKMB, CKMBINDEX, TROPONINI in the last 168 hours. BNP (last 3 results) No results for input(s): PROBNP in the last 8760 hours. HbA1C: No results for input(s): HGBA1C in the last 72 hours. CBG: Recent Labs  Lab 09/18/19 1634  GLUCAP 194*   Lipid Profile: No results for input(s): CHOL, HDL, LDLCALC, TRIG, CHOLHDL, LDLDIRECT in the last 72 hours. Thyroid Function Tests: Recent Labs    09/18/19 1741  TSH 1.148  FREET4 0.90   Anemia Panel: No  results for input(s): VITAMINB12, FOLATE, FERRITIN, TIBC, IRON, RETICCTPCT in the last 72 hours. Urine analysis:    Component Value Date/Time   COLORURINE YELLOW (A) 08/08/2019 2037   APPEARANCEUR HAZY (A) 08/08/2019 2037   LABSPEC 1.024 08/08/2019 2037   PHURINE 6.0 08/08/2019 2037   GLUCOSEU NEGATIVE 08/08/2019 2037   HGBUR SMALL (A) 08/08/2019 2037   BILIRUBINUR NEGATIVE 08/08/2019 2037   [place] 08/08/2019 2037   PROTEINUR 30 (A) 08/08/2019 2037   NITRITE POSITIVE (A) 08/08/2019 2037   LEUKOCYTESUR SMALL (A) 08/08/2019 2037    Radiological Exams on Admission: Dg Chest Port 1 View  Result Date: 09/18/2019 CLINICAL DATA:  Chest pain EXAM: PORTABLE CHEST 1 VIEW COMPARISON:  08/08/2019 chest radiograph. FINDINGS: Pacer pad overlies the left chest. Stable cardiomediastinal silhouette with normal heart size. No pneumothorax. No pleural effusion. Lungs appear clear, with no acute consolidative airspace disease and no pulmonary edema. IMPRESSION: No active disease. Electronically Signed   By: Ilona Sorrel M.D.   On: 09/18/2019 12:50   EKG: Independently reviewed.  Third-degree heart block. Echocardiogram: Ordered.  I reviewed all nursing notes, pharmacy notes, vitals, pertinent old records.  Assessment/Plan 1.  Third-degree heart block. Near-syncope [facility] cardiology was consulted by ED. Currently recommends conservative management per EDP. Will admit to the stepdown unit. Pacer pads at bedside. Avoid AV nodal blocking agents. Echocardiogram ordered.  2.  Essential hypertension I will be holding amlodipine, atenolol as well as lisinopril.  3.  Protein C and protein S deficiency protection patient is on warfarin. Currently holding that medication.  4.  Type 2 diabetes mellitus. Controlled with neuropathic complications. Holding home medication. On sliding scale insulin.  5.  Hypothyroidism  continuing Synthroid.  6.  Neuropathy. Continue gabapentin  continue Cymbalta.  Nutrition: Cardiac diet DVT Prophylaxis: Therapeutic Anticoagulation with Warfarin  Advance goals of care discussion: Full code   Consults: EDP personally Discussed with [facility] cardiology  Family Communication: no family was present at bedside, at the time of interview.   Disposition: Admitted as inpatient, step-down unit. Likely to be discharged home, in 2 days.  I have discussed plan of care as described above with RN and patient/family.  Author: Berle Mull, MD [facility] 09/18/2019 7:44 PM   To reach On-call, see care teams to locate the attending and reach out to them via [email or web address]. If 7PM-7AM, please contact night-coverage If you still have difficulty reaching the attending provider, please page the [facility] (  Director on Call) for [facility] on [place] for assistance.

## 2019-09-18 NOTE — Consult Note (Signed)
ANTICOAGULATION CONSULT NOTE - Initial Consult  Pharmacy Consult for Warfarin Indication: history of DVTs and protein C and S deficiency  Allergies  Allergen Reactions  . Gentamicin Other (See Comments)    Kidney function  . Morphine And Related Other (See Comments)    AMS - agitation and delusions **No legal documents to be signed if taking, per family**    Vital Signs: Temp: 98.6 F (37 C) (11/10 1151) Temp Source: Oral (11/10 1151) BP: 129/60 (11/10 1615) Pulse Rate: 105 (11/10 1615)  Labs: Recent Labs    09/18/19 1153 09/18/19 1349  HGB 12.8  --   HCT 40.1  --   PLT 205  --   LABPROT 26.3*  --   INR 2.5*  --   CREATININE 1.06*  --   TROPONINIHS 10 10    CrCl cannot be calculated (Unknown ideal weight.).   Medical History: Past Medical History:  Diagnosis Date  . Anginal pain ([place])   . Anxiety   . Diabetes mellitus without complication ([place])   . Hypertension   . Hypothyroidism   . Protein C deficiency ([place])   . Protein S deficiency ([name])     Medications:  Warfarin 5 mg TuTh and 6 mg EOD  Assessment: Patient is an 83 y/o F with history of DVTs, Protein C and S deficiency who presented to [facility] ED from cardiologist office in complete heart block with HR in the 30s. Pharmacy has been consulted for in-patient  warfarin dosing. Warfarin is currently being held for tentative pacemaker implantation.   11/10 INR 2.5, therapeutic  Goal of Therapy:  INR 2-3   Plan:  -Hold warfarin for impending procedure -Daily INR per protocol -Follow anticoagulation plan and need for peri-procedural therapeutic anticoagulation once INR drops below therapeutic range.   [place] Resident 09/18/2019,4:32 PM

## 2019-09-19 ENCOUNTER — Inpatient Hospital Stay
Admit: 2019-09-19 | Discharge: 2019-09-19 | Disposition: A | Discharge status: Home / Self Care | Payer: Medicare Other | Attending: Internal Medicine | Admitting: Internal Medicine

## 2019-09-19 DIAGNOSIS — R55 Syncope and collapse: Secondary | ICD-10-CM

## 2019-09-19 DIAGNOSIS — I442 Atrioventricular block, complete: Principal | ICD-10-CM

## 2019-09-19 LAB — URINALYSIS, COMPLETE (UACMP) WITH MICROSCOPIC
Bacteria, UA: NONE SEEN
Bilirubin Urine: NEGATIVE
Glucose, UA: NEGATIVE mg/dL
Hgb urine dipstick: NEGATIVE
Ketones, ur: NEGATIVE mg/dL
Nitrite: NEGATIVE
Protein, ur: 100 mg/dL — AB
Specific Gravity, Urine: 1.021 (ref 1.005–1.030)
WBC, UA: >50 WBC/hpf — ABNORMAL HIGH (ref 0–5)
pH: 7 (ref 5.0–8.0)

## 2019-09-19 LAB — COMPREHENSIVE METABOLIC PANEL
ALT: 12 U/L (ref 0–44)
AST: 14 U/L — ABNORMAL LOW (ref 15–41)
Albumin: 3.7 g/dL (ref 3.5–5.0)
Alkaline Phosphatase: 38 U/L (ref 38–126)
Anion gap: 9 (ref 5–15)
BUN: 33 mg/dL — ABNORMAL HIGH (ref 8–23)
CO2: 21 mmol/L — ABNORMAL LOW (ref 22–32)
Calcium: 9.2 mg/dL (ref 8.9–10.3)
Chloride: 114 mmol/L — ABNORMAL HIGH (ref 98–111)
Creatinine, Ser: 0.89 mg/dL (ref 0.44–1.00)
GFR calc Af Amer: >60 mL/min (ref >60)
GFR calc non Af Amer: >60 mL/min (ref >60)
Glucose, Bld: 161 mg/dL — ABNORMAL HIGH (ref 70–99)
Potassium: 3.9 mmol/L (ref 3.5–5.1)
Sodium: 144 mmol/L (ref 135–145)
Total Bilirubin: 0.8 mg/dL (ref 0.3–1.2)
Total Protein: 6.8 g/dL (ref 6.5–8.1)

## 2019-09-19 LAB — APTT: aPTT: 35 seconds (ref 24–36)

## 2019-09-19 LAB — PROTIME-INR
INR: 2.4 — ABNORMAL HIGH (ref 0.8–1.2)
INR: 2.4 — ABNORMAL HIGH (ref 0.8–1.2)
Prothrombin Time: 25.6 seconds — ABNORMAL HIGH (ref 11.4–15.2)
Prothrombin Time: 25.8 seconds — ABNORMAL HIGH (ref 11.4–15.2)

## 2019-09-19 LAB — MRSA PCR SCREENING: MRSA by PCR: NEGATIVE

## 2019-09-19 LAB — GLUCOSE, CAPILLARY
Glucose-Capillary: 106 mg/dL — ABNORMAL HIGH (ref 70–99)
Glucose-Capillary: 118 mg/dL — ABNORMAL HIGH (ref 70–99)
Glucose-Capillary: 140 mg/dL — ABNORMAL HIGH (ref 70–99)
Glucose-Capillary: 147 mg/dL — ABNORMAL HIGH (ref 70–99)

## 2019-09-19 LAB — ECHOCARDIOGRAM COMPLETE
Height: 67 in
Weight: 2980.62 oz

## 2019-09-19 LAB — CBC
HCT: 39.8 % (ref 36.0–46.0)
Hemoglobin: 12.8 g/dL (ref 12.0–15.0)
MCH: 28.8 pg (ref 26.0–34.0)
MCHC: 32.2 g/dL (ref 30.0–36.0)
MCV: 89.4 fL (ref 80.0–100.0)
Platelets: 188 10*3/uL (ref 150–400)
RBC: 4.45 MIL/uL (ref 3.87–5.11)
RDW: 15.3 % (ref 11.5–15.5)
WBC: 10.4 10*3/uL (ref 4.0–10.5)
nRBC: 0 % (ref 0.0–0.2)

## 2019-09-19 MED ORDER — VITAMIN K1 10 MG/ML IJ SOLN
5.0000 mg | Freq: Once | INTRAMUSCULAR | Status: AC
  Administered 2019-09-19: 5 mg via SUBCUTANEOUS
  Filled 2019-09-19: qty 0.5

## 2019-09-19 MED ORDER — SODIUM CHLORIDE 0.9 % IV SOLN
INTRAVENOUS | Status: DC
  Administered 2019-09-20: 06:00:00 via INTRAVENOUS

## 2019-09-19 MED ORDER — DOCUSATE SODIUM 100 MG PO CAPS: 100.0000 mg | ORAL_CAPSULE | Freq: Two times a day (BID) | ORAL | Status: DC | PRN

## 2019-09-19 MED ORDER — CEFAZOLIN SODIUM-DEXTROSE 2-4 GM/100ML-% IV SOLN
2.0000 g | INTRAVENOUS | Status: DC
  Filled 2019-09-19 (×2): qty 100

## 2019-09-19 NOTE — Consult Note (Signed)
ANTICOAGULATION CONSULT NOTE - Initial Consult  Pharmacy Consult for Heparin Dosing Indication: atrial fibrillation  Allergies  Allergen Reactions  . Gentamicin Other (See Comments)    Kidney function  . Morphine And Related Other (See Comments)    AMS - agitation and delusions **No legal documents to be signed if taking, per family**    Patient Measurements: Height: 5\' 7"  (170.2 cm) Weight: 186 lb 4.6 oz (84.5 kg) IBW/kg (Calculated) : 61.6 Heparin Dosing Weight: 79.3 kg  Vital Signs: Temp: 97.9 F (36.6 C) (11/11 0800) Temp Source: Axillary (11/11 0800) BP: 140/62 (11/11 0815) Pulse Rate: 37 (11/11 0815)  Labs: Recent Labs    09/18/19 1153 09/18/19 1349 09/18/19 1741 09/19/19 0501  HGB 12.8  --   --  12.8  HCT 40.1  --   --  39.8  PLT 205  --   --  188  LABPROT 26.3*  --   --  25.6*  INR 2.5*  --   --  2.4*  CREATININE 1.06*  --  1.03* 0.89  TROPONINIHS 10 10  --   --     Estimated Creatinine Clearance: 55.4 mL/min (by C-G formula based on SCr of 0.89 mg/dL).   Medical History: Past Medical History:  Diagnosis Date  . Anginal pain ([place])   . Anxiety   . Diabetes mellitus without complication ([place])   . Hypertension   . Hypothyroidism   . Protein C deficiency ([place])   . Protein S deficiency ([place])     Assessment: Sierra [name] is an 10 YOF with a PMH of CAD, protein C & S deficiency, HTN, hypothyroidism, T2DM, and anxiety. She was admitted on 09/18/2019 after presenting to the ED with complete heart block. Patient had an elevated INR and received vitamin K, warfarin was held. Pharmacy has been consulted for heparin dosing and monitoring once INR < 1.5.  Baseline Labs:  aPTT: pending INR: 2.4 Hemoglobin: 12.8 Hematocrit: 39.8 Platelets: 188  Goal of Therapy:  - Heparin level 0.3-0.7 - Monitor platelets by anticoagulation protocol: Yes   Plan:  - Will check INR again at 1800. - If INR < 1.5, heparin bolus of 4,000 units. - Follow bolus with Infusion  rate of 1200 units/hr. - Check heparin level 8 hours after initiation of drip. - Will monitor H&H and platelets per protocol.  Thank you for allowing pharmacy to be a part of this patient's care.  Raiford Simmonds, PharmD Candidate 09/19/2019,12:24 PM

## 2019-09-19 NOTE — Progress Notes (Signed)
*  PRELIMINARY RESULTS* Echocardiogram 2D Echocardiogram has been performed.  Sherrie Sport 09/19/2019, 11:34 AM

## 2019-09-19 NOTE — Progress Notes (Signed)
PROGRESS NOTE                                                                                                                                                                                                             Patient Demographics:    Sierra [name], is a 83 y.o. female, DOB - 12-Feb-1936, IRS:[id number]  Admit date - 09/18/2019   Admitting Physician Lavina Hamman, MD  Outpatient Primary MD for the patient is Donnamarie Rossetti, PA-C  LOS - 1  Outpatient Specialists: [facility]       Brief Narrative  83 y.o. 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 EKG done there showed complete heart block, she was then referred to cardiology and and referred for admission.      Subjective:   Denies further dizziness. HR in the 30s on tele   Assessment  & Plan :    Active Problems:   Complete heart block ([name]) Continue stepdown monitor. 2nd degree heart block on monitor this am. Check repeat ekg. Off BB. Bedside pacer.  cardiology following. Plan on pacemaker once INR improved ( still 2.4 this am). Ordered vitamin k 5 mg sq.  Check INR this afternoon, once <=1.5 place on heparin drip for bridging  check 2 d echo. Further recs per cardiology.   Active symptoms Essential hypertension BP meds held.   Protein C and protein S deficiency  Warfarin on hold and being reversed for pacemaker placement  Type 2 diabetes mellitus with neuropathy. Holding home medication. Monitor sliding scale insulin.   Hypothyroidism  continuing Synthroid.   Neuropathy. Continue gabapentin and Cymbalta.  Hypokalemia  replaced   Code Status : full code  Family Communication   none  Disposition Plan  : continue stepdown monitoring  Barriers For Discharge : active symptoms   Consults  :  cardiology  Procedures  : echo  DVT Prophylaxis  :  Lovenox -   Lab Results  Component Value Date   PLT 188 09/19/2019    Antibiotics  :   Anti-infectives (From admission, onward)   None        Objective:   Vitals:   09/19/19 0400 09/19/19 0700 09/19/19 0800 09/19/19 0815  BP: Marland Kitchen)  151/71   140/62  Pulse: (!) 32 (!) 33 (!) 29 (!) 37  Resp: 15 13 14 14   Temp:   97.9 F (36.6 C)   TempSrc: Oral  Axillary   SpO2: 98% 95% 98% 100%  Weight:      Height:        Wt Readings from Last 3 Encounters:  09/18/19 84.5 kg  08/08/19 85.8 kg  01/11/19 88.9 kg     Intake/Output Summary (Last 24 hours) at 09/19/2019 0951 Last data filed at 09/19/2019 0905 Gross per 24 hour  Intake 360 ml  Output 0 ml  Net 360 ml     Physical Exam  Gen: elderly pleasant female , not in distress,  HEENT: no pallor, moist mucosa, supple neck Chest: clear b/l, no added sounds CVS: S1&S2, n bradycardic, no murmurs, rubs or gallop GI: soft, NT, ND, BS+ Musculoskeletal: warm, no edema     Data Review:    CBC Recent Labs  Lab 09/18/19 1153 09/19/19 0501  WBC 8.4 10.4  HGB 12.8 12.8  HCT 40.1 39.8  PLT 205 188  MCV 89.9 89.4  MCH 28.7 28.8  MCHC 31.9 32.2  RDW 15.3 15.3    Chemistries  Recent Labs  Lab 09/18/19 1153 09/18/19 1741 09/19/19 0501  NA 142 141 144  K 3.4* 3.3* 3.9  CL 110 108 114*  CO2 22 24 21*  GLUCOSE 109* 190* 161*  BUN 34* 33* 33*  CREATININE 1.06* 1.03* 0.89  CALCIUM 9.1 9.2 9.2  MG 1.6* 2.2  --   AST  --   --  14*  ALT  --   --  12  ALKPHOS  --   --  38  BILITOT  --   --  0.8   ------------------------------------------------------------------------------------------------------------------ No results for input(s): CHOL, HDL, LDLCALC, TRIG, CHOLHDL, LDLDIRECT in the last 72 hours.  Lab Results  Component Value Date   HGBA1C 6.7 (H) 08/09/2019    ------------------------------------------------------------------------------------------------------------------ Recent Labs    09/18/19 1741  TSH 1.148   ------------------------------------------------------------------------------------------------------------------ No results for input(s): VITAMINB12, FOLATE, FERRITIN, TIBC, IRON, RETICCTPCT in the last 72 hours.  Coagulation profile Recent Labs  Lab 09/18/19 1153 09/19/19 0501  INR 2.5* 2.4*    No results for input(s): DDIMER in the last 72 hours.  Cardiac Enzymes No results for input(s): CKMB, TROPONINI, MYOGLOBIN in the last 168 hours.  Invalid input(s): CK ------------------------------------------------------------------------------------------------------------------ No results found for: BNP  Inpatient Medications  Scheduled Meds: . acidophilus  1 capsule Oral Daily  . atorvastatin  40 mg Oral Daily  . Chlorhexidine Gluconate Cloth  6 each Topical Daily  . docusate sodium  100 mg Oral BID  . DULoxetine  60 mg Oral QHS  . gabapentin  100 mg Oral QHS  . hydrocortisone  1 application Topical BID  . insulin aspart  0-15 Units Subcutaneous TID WC  . insulin aspart  0-5 Units Subcutaneous QHS  . levothyroxine  88 mcg Oral QAC breakfast  . loperamide  2 mg Oral Daily  . mesalamine  1.2 g Oral Daily  . phytonadione  5 mg Subcutaneous Once  . saccharomyces boulardii  250 mg Oral Daily   Continuous Infusions: PRN Meds:.acetaminophen **OR** acetaminophen, ondansetron **OR** ondansetron (ZOFRAN) IV  Micro Results Recent Results (from the past 240 hour(s))  SARS CORONAVIRUS 2 (TAT 6-24 HRS) Nasopharyngeal Nasopharyngeal Swab     Status: None   Collection Time: 09/18/19 12:35 PM   Specimen: Nasopharyngeal Swab  Result Value Ref Range  Status   SARS Coronavirus 2 NEGATIVE NEGATIVE Final    Comment: (NOTE) SARS-CoV-2 target nucleic acids are NOT DETECTED. The SARS-CoV-2 RNA is generally detectable in upper and  lower respiratory specimens during the acute phase of infection. Negative results do not preclude SARS-CoV-2 infection, do not rule out co-infections with other pathogens, and should not be used as the sole basis for treatment or other patient management decisions. Negative results must be combined with clinical observations, patient history, and epidemiological information. The expected result is Negative. Fact Sheet for Patients: SugarRoll.be Fact Sheet for Healthcare Providers: https://www.woods-mathews.com/ This test is not yet approved or cleared by the [place] FDA and  has been authorized for detection and/or diagnosis of SARS-CoV-2 by FDA under an Emergency Use Authorization (EUA). This EUA will remain  in effect (meaning this test can be used) for the duration of the COVID-19 declaration under Section 56 4(b)(1) of the Act, 21 U.S.C. section 360bbb-3(b)(1), unless the authorization is terminated or revoked sooner. Performed at [facility], [place] [address], [place], [place] [address]   MRSA PCR Screening     Status: None   Collection Time: 09/18/19 11:01 PM   Specimen: Nasal Mucosa; Nasopharyngeal  Result Value Ref Range Status   MRSA by PCR NEGATIVE NEGATIVE Final    Comment:        The GeneXpert MRSA Assay (FDA approved for NASAL specimens only), is one component of a comprehensive MRSA colonization surveillance program. It is not intended to diagnose MRSA infection nor to guide or monitor treatment for MRSA infections. Performed at [facility], [address], [place], [place] [address]     Radiology Reports Dg Chest [place] 1 View  Result Date: 09/18/2019 CLINICAL DATA:  Chest pain EXAM: PORTABLE CHEST 1 VIEW COMPARISON:  08/08/2019 chest radiograph. FINDINGS: Pacer pad overlies the left chest. Stable cardiomediastinal silhouette with normal heart size. No pneumothorax. No pleural effusion. Lungs  appear clear, with no acute consolidative airspace disease and no pulmonary edema. IMPRESSION: No active disease. Electronically Signed   By: Ilona Sorrel M.D.   On: 09/18/2019 12:50   Mm 3d Screen Breast Bilateral  Result Date: 08/27/2019 CLINICAL DATA:  Screening. EXAM: DIGITAL SCREENING BILATERAL MAMMOGRAM WITH TOMO AND CAD COMPARISON:  Previous exam(s). ACR Breast Density Category c: The breast tissue is heterogeneously dense, which may obscure small masses. FINDINGS: There are no findings suspicious for malignancy. Images were processed with CAD. IMPRESSION: No mammographic evidence of malignancy. A result letter of this screening mammogram will be mailed directly to the patient. RECOMMENDATION: Screening mammogram in one year. (Code:SM-B-01Y) BI-RADS CATEGORY  1: Negative. Electronically Signed   By: Lovey Newcomer M.D.   On: 08/27/2019 11:13    Time Spent in minutes 35   Chane Magner M.D on 09/19/2019 at 9:51 AM  Between 7am to 7pm - Pager - [phone]  After 7pm go to [email or web address] - password [facility]  [facility] -  Office  [phone]

## 2019-09-19 NOTE — Consult Note (Signed)
[place] for Heparin Dosing Indication: atrial fibrillation  Allergies  Allergen Reactions  . Gentamicin Other (See Comments)    Kidney function  . Morphine And Related Other (See Comments)    AMS - agitation and delusions **No legal documents to be signed if taking, per family**    Patient Measurements: Height: 5\' 7"  (170.2 cm) Weight: 186 lb 4.6 oz (84.5 kg) IBW/kg (Calculated) : 61.6 Heparin Dosing Weight: 79.3 kg  Vital Signs: Temp: 98.1 F (36.7 C) (11/11 1400) Temp Source: Oral (11/11 1400) BP: 156/63 (11/11 1700) Pulse Rate: 40 (11/11 1800)  Labs: Recent Labs    09/18/19 1153 09/18/19 1349 09/18/19 1741 09/19/19 0501 09/19/19 1215 09/19/19 1755  HGB 12.8  --   --  12.8  --   --   HCT 40.1  --   --  39.8  --   --   PLT 205  --   --  188  --   --   APTT  --   --   --   --  35  --   LABPROT 26.3*  --   --  25.6*  --  25.8*  INR 2.5*  --   --  2.4*  --  2.4*  CREATININE 1.06*  --  1.03* 0.89  --   --   TROPONINIHS 10 10  --   --   --   --     Estimated Creatinine Clearance: 55.4 mL/min (by C-G formula based on SCr of 0.89 mg/dL).   Medical History: Past Medical History:  Diagnosis Date  . Anginal pain ([place])   . Anxiety   . Diabetes mellitus without complication ([place])   . Hypertension   . Hypothyroidism   . Protein C deficiency ([place])   . Protein S deficiency ([place])     Assessment: Sierra [name] is an 39 YOF with a PMH of CAD, protein C & S deficiency, HTN, hypothyroidism, T2DM, and anxiety. She was admitted on 09/18/2019 after presenting to the ED with complete heart block. Patient had an elevated INR and received vitamin K, warfarin was held. Pharmacy has been consulted for heparin dosing and monitoring once INR < 1.5.  Baseline Labs:  aPTT: pending INR: 2.4 Hemoglobin: 12.8 Hematocrit: 39.8 Platelets: 188  Goal of Therapy:  - Heparin level 0.3-0.7 - Monitor platelets by anticoagulation protocol: Yes    Plan:  INR 2.4. Will follow up with morning labs with plan to start heparin once INR < 1.5.  Thank you for allowing pharmacy to be a part of this patient's care.  Tawnya Crook, PharmD 09/19/2019,6:46 PM

## 2019-09-20 ENCOUNTER — Encounter
Admission: EM | Disposition: A | Discharge status: Nursing Home (Includes ICF) | Payer: Self-pay | Source: Ambulatory Visit | Attending: Internal Medicine

## 2019-09-20 ENCOUNTER — Encounter: Payer: Self-pay | Admitting: Cardiology

## 2019-09-20 DIAGNOSIS — R001 Bradycardia, unspecified: Secondary | ICD-10-CM

## 2019-09-20 HISTORY — PX: TEMPORARY PACEMAKER: CATH118268

## 2019-09-20 HISTORY — PX: PACEMAKER LEADLESS INSERTION: EP1219

## 2019-09-20 LAB — BASIC METABOLIC PANEL
Anion gap: 6 (ref 5–15)
BUN: 25 mg/dL — ABNORMAL HIGH (ref 8–23)
CO2: 23 mmol/L (ref 22–32)
Calcium: 8.6 mg/dL — ABNORMAL LOW (ref 8.9–10.3)
Chloride: 111 mmol/L (ref 98–111)
Creatinine, Ser: 0.78 mg/dL (ref 0.44–1.00)
GFR calc Af Amer: >60 mL/min (ref >60)
GFR calc non Af Amer: >60 mL/min (ref >60)
Glucose, Bld: 160 mg/dL — ABNORMAL HIGH (ref 70–99)
Potassium: 3.8 mmol/L (ref 3.5–5.1)
Sodium: 140 mmol/L (ref 135–145)

## 2019-09-20 LAB — PROTIME-INR
INR: 1.8 — ABNORMAL HIGH (ref 0.8–1.2)
Prothrombin Time: 20.8 seconds — ABNORMAL HIGH (ref 11.4–15.2)

## 2019-09-20 LAB — GLUCOSE, CAPILLARY
Glucose-Capillary: 148 mg/dL — ABNORMAL HIGH (ref 70–99)
Glucose-Capillary: 107 mg/dL — ABNORMAL HIGH (ref 70–99)
Glucose-Capillary: 111 mg/dL — ABNORMAL HIGH (ref 70–99)

## 2019-09-20 SURGERY — PACEMAKER LEADLESS INSERTION
Anesthesia: Moderate Sedation | Laterality: Right

## 2019-09-20 SURGERY — PACEMAKER LEADLESS INSERTION
Anesthesia: Moderate Sedation

## 2019-09-20 MED ORDER — MIDAZOLAM HCL 2 MG/2ML IJ SOLN
INTRAMUSCULAR | Status: DC | PRN
  Administered 2019-09-20 (×2): 1 mg via INTRAVENOUS

## 2019-09-20 MED ORDER — ATENOLOL 25 MG PO TABS
25.0000 mg | ORAL_TABLET | Freq: Every day | ORAL | Status: DC
  Administered 2019-09-21: 25 mg via ORAL
  Filled 2019-09-20: qty 1

## 2019-09-20 MED ORDER — CEFAZOLIN (ANCEF) 1 G IV SOLR: INTRAVENOUS | Status: DC | PRN

## 2019-09-20 MED ORDER — FENTANYL CITRATE (PF) 100 MCG/2ML IJ SOLN
INTRAMUSCULAR | Status: AC
  Filled 2019-09-20: qty 2

## 2019-09-20 MED ORDER — SODIUM CHLORIDE 0.9% FLUSH: 3.0000 mL | INTRAVENOUS | Status: DC | PRN

## 2019-09-20 MED ORDER — CEFAZOLIN SODIUM-DEXTROSE 2-4 GM/100ML-% IV SOLN
INTRAVENOUS | Status: AC
  Administered 2019-09-20: 2 g
  Filled 2019-09-20: qty 100

## 2019-09-20 MED ORDER — HYDRALAZINE HCL 20 MG/ML IJ SOLN
5.0000 mg | Freq: Four times a day (QID) | INTRAMUSCULAR | Status: DC | PRN
  Administered 2019-09-20: 5 mg via INTRAVENOUS
  Filled 2019-09-20: qty 1

## 2019-09-20 MED ORDER — HEPARIN SODIUM (PORCINE) 1000 UNIT/ML IJ SOLN
INTRAMUSCULAR | Status: DC | PRN
  Administered 2019-09-20: 4000 [IU] via INTRAVENOUS

## 2019-09-20 MED ORDER — SODIUM CHLORIDE 0.9% FLUSH
3.0000 mL | Freq: Two times a day (BID) | INTRAVENOUS | Status: DC
  Administered 2019-09-20 – 2019-09-21 (×2): 3 mL via INTRAVENOUS

## 2019-09-20 MED ORDER — FENTANYL CITRATE (PF) 100 MCG/2ML IJ SOLN
INTRAMUSCULAR | Status: DC | PRN
  Administered 2019-09-20 (×2): 25 ug via INTRAVENOUS

## 2019-09-20 MED ORDER — AMLODIPINE BESYLATE 5 MG PO TABS
2.5000 mg | ORAL_TABLET | Freq: Every day | ORAL | Status: DC
  Administered 2019-09-20 – 2019-09-21 (×2): 2.5 mg via ORAL
  Filled 2019-09-20 (×2): qty 1

## 2019-09-20 MED ORDER — METOPROLOL TARTRATE 5 MG/5ML IV SOLN
INTRAVENOUS | Status: DC | PRN
  Administered 2019-09-20: 5 mg via INTRAVENOUS

## 2019-09-20 MED ORDER — SODIUM CHLORIDE 0.9 % IV SOLN: 250.0000 mL | INTRAVENOUS | Status: DC | PRN

## 2019-09-20 MED ORDER — WARFARIN SODIUM 6 MG PO TABS: 6.0000 mg | ORAL_TABLET | Freq: Every day | ORAL | Status: DC

## 2019-09-20 MED ORDER — MIDAZOLAM HCL 2 MG/2ML IJ SOLN
INTRAMUSCULAR | Status: AC
  Filled 2019-09-20: qty 2

## 2019-09-20 MED ORDER — ONDANSETRON HCL 4 MG/2ML IJ SOLN: 4.0000 mg | Freq: Four times a day (QID) | INTRAMUSCULAR | Status: DC | PRN

## 2019-09-20 MED ORDER — METOPROLOL TARTRATE 5 MG/5ML IV SOLN
INTRAVENOUS | Status: AC
  Filled 2019-09-20: qty 5

## 2019-09-20 MED ORDER — ACETAMINOPHEN 325 MG PO TABS
650.0000 mg | ORAL_TABLET | ORAL | Status: DC | PRN
  Administered 2019-09-21: 650 mg via ORAL
  Filled 2019-09-20 (×2): qty 2

## 2019-09-20 MED ORDER — WARFARIN - PHARMACIST DOSING INPATIENT: Freq: Every day | Status: DC

## 2019-09-20 MED ORDER — IOHEXOL 300 MG/ML  SOLN
INTRAMUSCULAR | Status: DC | PRN
  Administered 2019-09-20: 30 mL

## 2019-09-20 MED ORDER — WARFARIN SODIUM 7.5 MG PO TABS
7.5000 mg | ORAL_TABLET | Freq: Every day | ORAL | Status: AC
  Administered 2019-09-20: 7.5 mg via ORAL
  Filled 2019-09-20: qty 1

## 2019-09-20 MED ORDER — LISINOPRIL 10 MG PO TABS
40.0000 mg | ORAL_TABLET | Freq: Every day | ORAL | Status: DC
  Administered 2019-09-20 – 2019-09-21 (×2): 40 mg via ORAL
  Filled 2019-09-20: qty 4
  Filled 2019-09-20 (×2): qty 8
  Filled 2019-09-20: qty 4

## 2019-09-20 MED ORDER — HEPARIN SODIUM (PORCINE) 1000 UNIT/ML IJ SOLN
INTRAMUSCULAR | Status: AC
  Filled 2019-09-20: qty 1

## 2019-09-20 SURGICAL SUPPLY — 16 items
CABLE ADAPT PACING TEMP 12FT (ADAPTER) ×3 IMPLANT
DILATOR VESSEL 38 20CM 12FR (INTRODUCER) ×3 IMPLANT
DILATOR VESSEL 38 20CM 14FR (INTRODUCER) ×3 IMPLANT
DILATOR VESSEL 38 20CM 18FR (INTRODUCER) ×3 IMPLANT
DILATOR VESSEL 38 20CM 8FR (INTRODUCER) ×3 IMPLANT
GUIDEWIRE SUPER STIFF .035X180 (WIRE) ×3 IMPLANT
MICRA AV TRANSCATH PACING SYS (Pacemaker) ×3 IMPLANT
MICRA INTRODUCER SHEATH (SHEATH) ×3
NEEDLE PERC 18GX7CM (NEEDLE) ×3 IMPLANT
PAD ONESTEP ZOLL R SERIES ADT (MISCELLANEOUS) ×3 IMPLANT
SHEATH AVANTI 6FR X 11CM (SHEATH) ×3 IMPLANT
SHEATH AVANTI 7FRX11 (SHEATH) ×3 IMPLANT
SHEATH INTRODUCER MICRA (SHEATH) ×2 IMPLANT
SYSTEM PACING TRNSCTH AV MICRA (Pacemaker) ×2 IMPLANT
WIRE GUIDERIGHT .035X150 (WIRE) ×3 IMPLANT
WIRE PACING TEMP ST TIP 5 (CATHETERS) ×3 IMPLANT

## 2019-09-20 NOTE — Progress Notes (Signed)
SUBJECTIVE: Notified of persistently elevated BP by nursing staff.  OBJECTIVE: She is afebrile with blood pressure 186/89 mm Hg and pulse rate 35 beats/min. There were no focal neurological deficits; she was alert and oriented x4.   ASSESSMENT: 83 y.o female with past medical hx of CAD, protein C&S deficiency, DM,CKD, HTN, DVT, HLD and hypothyroidism presenting with symptomatic complete heart block from her cardiologist office  PLAN 1. 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 - Will avoid lowering BP quickly at this time unless persistently >468 systolic - Close neurochecks - Anticipate improvement of BP with pacemaker placement this am. - Continue current medical management per treatment team.     Rufina Falco, DNP, CCRN, FNP-C [facility] Nurse Practitioner Between 7pm to 7am - Pager [phone] Actively using [employer]iku secure chat messaging  After 7am go to [email or web address] - [email or web address]  [facility] [employer]  [phone]

## 2019-09-20 NOTE — Consult Note (Signed)
[place] for Warfarin Dosing Indication: atrial fibrillation  Allergies  Allergen Reactions  . Gentamicin Other (See Comments)    Kidney function  . Morphine And Related Other (See Comments)    AMS - agitation and delusions **No legal documents to be signed if taking, per family**    Patient Measurements: Height: 5\' 7"  (170.2 cm) Weight: 193 lb (87.5 kg) IBW/kg (Calculated) : 61.6  Vital Signs: Temp: 98.2 F (36.8 C) (11/12 1146) Temp Source: Oral (11/12 1146) BP: 151/84 (11/12 1146) Pulse Rate: 39 (11/12 1146)  Labs: Recent Labs    09/18/19 1153 09/18/19 1349 09/18/19 1741 09/19/19 0501 09/19/19 1215 09/19/19 1755 09/20/19 0601  HGB 12.8  --   --  12.8  --   --   --   HCT 40.1  --   --  39.8  --   --   --   PLT 205  --   --  188  --   --   --   APTT  --   --   --   --  35  --   --   LABPROT 26.3*  --   --  25.6*  --  25.8* 20.8*  INR 2.5*  --   --  2.4*  --  2.4* 1.8*  CREATININE 1.06*  --  1.03* 0.89  --   --  0.78  TROPONINIHS 10 10  --   --   --   --   --     Estimated Creatinine Clearance: 62.7 mL/min (by C-G formula based on SCr of 0.78 mg/dL).   Medical History: Past Medical History:  Diagnosis Date  . Anginal pain ([place])   . Anxiety   . Diabetes mellitus without complication ([place])   . Hypertension   . Hypothyroidism   . Protein C deficiency ([place])   . Protein S deficiency ([place])     Assessment: Sierra [name] is an 62 YOF with a PMH of CAD, protein C & S deficiency, HTN, hypothyroidism, T2DM, and anxiety. She was admitted on 09/18/2019 after presenting to the ED with complete heart block. Patient had an elevated INR and received vitamin K, warfarin was held. Pharmacy has been consulted for warfarin dosing.  Goal of Therapy:  - INR 2-3 - Monitor platelets by anticoagulation protocol: Yes   Plan:  - INR 1.8 @ 0601. - Will check INR tomorrow with AM labs. - Will give 7.5mg  of warfarin (approx 50% greater than  average PTA dose). - Will monitor H&H and platelets per protocol.  Thank you for allowing pharmacy to be a part of this patient's care.  Raiford Simmonds, PharmD Candidate 09/20/2019,12:03 PM

## 2019-09-20 NOTE — Progress Notes (Signed)
Pacemaker rep. At bedside, discussing pacer with pt. At length. Dr. Saralyn Pilar at bedside to speak with  pt. And her her spouse re: procedure. Both verbalize understanding of conversations.

## 2019-09-20 NOTE — Progress Notes (Signed)
PROGRESS NOTE                                                                                                                                                                                                             Patient Demographics:    Sierra [name], is a 83 y.o. female, DOB - August 09, 1936, PJA:[id number]  Admit date - 09/18/2019   Admitting Physician Lavina Hamman, MD  Outpatient Primary MD for the patient is Donnamarie Rossetti, PA-C  LOS - 2  Outpatient Specialists: [facility]       Brief Narrative  83 y.o. 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 EKG done there showed complete heart block, she was then referred to cardiology and and referred for admission.      Subjective:   Heart rate frequently in the 30s with elevated blood pressure greater than 341 systolic.  Denies any dizziness or palpitation.   Assessment  & Plan :    Active Problems:   Complete heart block ([name]) Scheduled for pacemaker placement this afternoon.  INR reversed to 1.8. 2D echo with normal LV function and grade 1 diastolic dysfunction.  Further plan per cardiology.   Active symptoms Essential hypertension Blood pressure elevated.  Meds are held and will reassess after pacemaker placement.   Protein C and protein S deficiency  Warfarin held and reversed for pacemaker placement.  Plan to resume after procedure.  Type 2 diabetes mellitus with neuropathy. Holding home medication and being monitored on sliding scale coverage.   Hypothyroidism  continuing Synthroid.   Neuropathy. Continue gabapentin and Cymbalta.  Hypokalemia  replaced   Code Status : full code  Family Communication   none  Disposition Plan  : continue stepdown monitoring  Barriers For Discharge :  active symptoms  Consults  :  cardiology  Procedures  : echo  DVT Prophylaxis  : Supratherapeutic INR  Lab Results  Component Value Date   PLT 188 09/19/2019    Antibiotics  :   Anti-infectives (From admission, onward)   Start     Dose/Rate Route Frequency Ordered Stop   09/19/19 2345  ceFAZolin (ANCEF) IVPB 2g/100 mL premix     2 g 200 mL/hr over 30  Minutes Intravenous On call 09/19/19 2335 09/20/19 2345        Objective:   Vitals:   09/20/19 0400 09/20/19 0500 09/20/19 0600 09/20/19 0700  BP: (!) 165/83 (!) 191/74 (!) 168/65 (!) 160/67  Pulse: (!) 37 (!) 37 (!) 37 (!) 38  Resp: 15 14 16 13   Temp:    98.1 F (36.7 C)  TempSrc:      SpO2: 93% 98% 97% 96%  Weight:      Height:        Wt Readings from Last 3 Encounters:  09/18/19 84.5 kg  08/08/19 85.8 kg  01/11/19 88.9 kg     Intake/Output Summary (Last 24 hours) at 09/20/2019 0955 Last data filed at 09/20/2019 0600 Gross per 24 hour  Intake 120 ml  Output 900 ml  Net -780 ml   Physical exam Elderly female not in distress HEENT: Moist mucosa, supple neck Chest: Clear CVS: S1-S2 bradycardic, no murmurs GI: Soft, nondistended, nontender Musculoskeletal: Warm, no edema      Data Review:    CBC Recent Labs  Lab 09/18/19 1153 09/19/19 0501  WBC 8.4 10.4  HGB 12.8 12.8  HCT 40.1 39.8  PLT 205 188  MCV 89.9 89.4  MCH 28.7 28.8  MCHC 31.9 32.2  RDW 15.3 15.3    Chemistries  Recent Labs  Lab 09/18/19 1153 09/18/19 1741 09/19/19 0501 09/20/19 0601  NA 142 141 144 140  K 3.4* 3.3* 3.9 3.8  CL 110 108 114* 111  CO2 22 24 21* 23  GLUCOSE 109* 190* 161* 160*  BUN 34* 33* 33* 25*  CREATININE 1.06* 1.03* 0.89 0.78  CALCIUM 9.1 9.2 9.2 8.6*  MG 1.6* 2.2  --   --   AST  --   --  14*  --   ALT  --   --  12  --   ALKPHOS  --   --  38  --   BILITOT  --   --  0.8  --    ------------------------------------------------------------------------------------------------------------------ No  results for input(s): CHOL, HDL, LDLCALC, TRIG, CHOLHDL, LDLDIRECT in the last 72 hours.  Lab Results  Component Value Date   HGBA1C 6.7 (H) 08/09/2019   ------------------------------------------------------------------------------------------------------------------ Recent Labs    09/18/19 1741  TSH 1.148   ------------------------------------------------------------------------------------------------------------------ No results for input(s): VITAMINB12, FOLATE, FERRITIN, TIBC, IRON, RETICCTPCT in the last 72 hours.  Coagulation profile Recent Labs  Lab 09/18/19 1153 09/19/19 0501 09/19/19 1755 09/20/19 0601  INR 2.5* 2.4* 2.4* 1.8*    No results for input(s): DDIMER in the last 72 hours.  Cardiac Enzymes No results for input(s): CKMB, TROPONINI, MYOGLOBIN in the last 168 hours.  Invalid input(s): CK ------------------------------------------------------------------------------------------------------------------ No results found for: BNP  Inpatient Medications  Scheduled Meds: . acidophilus  1 capsule Oral Daily  . atorvastatin  40 mg Oral Daily  . Chlorhexidine Gluconate Cloth  6 each Topical Daily  . DULoxetine  60 mg Oral QHS  . gabapentin  100 mg Oral QHS  . hydrocortisone  1 application Topical BID  . insulin aspart  0-15 Units Subcutaneous TID WC  . insulin aspart  0-5 Units Subcutaneous QHS  . levothyroxine  88 mcg Oral QAC breakfast  . loperamide  2 mg Oral Daily  . mesalamine  1.2 g Oral Daily  . saccharomyces boulardii  250 mg Oral Daily   Continuous Infusions: . sodium chloride 50 mL/hr at 09/20/19 0559  .  ceFAZolin (ANCEF) IV     PRN Meds:.acetaminophen **  OR** acetaminophen, docusate sodium, ondansetron **OR** ondansetron (ZOFRAN) IV  Micro Results Recent Results (from the past 240 hour(s))  SARS CORONAVIRUS 2 (TAT 6-24 HRS) Nasopharyngeal Nasopharyngeal Swab     Status: None   Collection Time: 09/18/19 12:35 PM   Specimen: Nasopharyngeal  Swab  Result Value Ref Range Status   SARS Coronavirus 2 NEGATIVE NEGATIVE Final    Comment: (NOTE) SARS-CoV-2 target nucleic acids are NOT DETECTED. The SARS-CoV-2 RNA is generally detectable in upper and lower respiratory specimens during the acute phase of infection. Negative results do not preclude SARS-CoV-2 infection, do not rule out co-infections with other pathogens, and should not be used as the sole basis for treatment or other patient management decisions. Negative results must be combined with clinical observations, patient history, and epidemiological information. The expected result is Negative. Fact Sheet for Patients: SugarRoll.be Fact Sheet for Healthcare Providers: https://www.woods-mathews.com/ This test is not yet approved or cleared by the [place] FDA and  has been authorized for detection and/or diagnosis of SARS-CoV-2 by FDA under an Emergency Use Authorization (EUA). This EUA will remain  in effect (meaning this test can be used) for the duration of the COVID-19 declaration under Section 56 4(b)(1) of the Act, 21 U.S.C. section 360bbb-3(b)(1), unless the authorization is terminated or revoked sooner. Performed at [facility], [place] [address], [place], [place] [address]   MRSA PCR Screening     Status: None   Collection Time: 09/18/19 11:01 PM   Specimen: Nasal Mucosa; Nasopharyngeal  Result Value Ref Range Status   MRSA by PCR NEGATIVE NEGATIVE Final    Comment:        The GeneXpert MRSA Assay (FDA approved for NASAL specimens only), is one component of a comprehensive MRSA colonization surveillance program. It is not intended to diagnose MRSA infection nor to guide or monitor treatment for MRSA infections. Performed at [facility], [address], [place], [place] [address]     Radiology Reports Dg Chest [place] 1 View  Result Date: 09/18/2019 CLINICAL DATA:  Chest pain EXAM:  PORTABLE CHEST 1 VIEW COMPARISON:  08/08/2019 chest radiograph. FINDINGS: Pacer pad overlies the left chest. Stable cardiomediastinal silhouette with normal heart size. No pneumothorax. No pleural effusion. Lungs appear clear, with no acute consolidative airspace disease and no pulmonary edema. IMPRESSION: No active disease. Electronically Signed   By: Ilona Sorrel M.D.   On: 09/18/2019 12:50   Mm 3d Screen Breast Bilateral  Result Date: 08/27/2019 CLINICAL DATA:  Screening. EXAM: DIGITAL SCREENING BILATERAL MAMMOGRAM WITH TOMO AND CAD COMPARISON:  Previous exam(s). ACR Breast Density Category c: The breast tissue is heterogeneously dense, which may obscure small masses. FINDINGS: There are no findings suspicious for malignancy. Images were processed with CAD. IMPRESSION: No mammographic evidence of malignancy. A result letter of this screening mammogram will be mailed directly to the patient. RECOMMENDATION: Screening mammogram in one year. (Code:[id number]-B-01Y) BI-RADS CATEGORY  1: Negative. Electronically Signed   By: Lovey Newcomer M.D.   On: 08/27/2019 11:13    Time Spent in minutes 35   Danel Requena M.D on 09/20/2019 at 9:55 AM  Between 7am to 7pm - Pager - [phone]  After 7pm go to [email or web address] - password [email or web address]  [facility] -  Office  [phone]

## 2019-09-21 DIAGNOSIS — E876 Hypokalemia: Secondary | ICD-10-CM

## 2019-09-21 DIAGNOSIS — E119 Type 2 diabetes mellitus without complications: Secondary | ICD-10-CM

## 2019-09-21 DIAGNOSIS — R55 Syncope and collapse: Secondary | ICD-10-CM | POA: Diagnosis present

## 2019-09-21 LAB — BASIC METABOLIC PANEL
Anion gap: 8 (ref 5–15)
BUN: 17 mg/dL (ref 8–23)
CO2: 23 mmol/L (ref 22–32)
Calcium: 8.9 mg/dL (ref 8.9–10.3)
Chloride: 109 mmol/L (ref 98–111)
Creatinine, Ser: 0.82 mg/dL (ref 0.44–1.00)
GFR calc Af Amer: >60 mL/min (ref >60)
GFR calc non Af Amer: >60 mL/min (ref >60)
Glucose, Bld: 163 mg/dL — ABNORMAL HIGH (ref 70–99)
Potassium: 3.4 mmol/L — ABNORMAL LOW (ref 3.5–5.1)
Sodium: 140 mmol/L (ref 135–145)

## 2019-09-21 LAB — PROTIME-INR
INR: 1.4 — ABNORMAL HIGH (ref 0.8–1.2)
Prothrombin Time: 16.7 seconds — ABNORMAL HIGH (ref 11.4–15.2)

## 2019-09-21 LAB — CBC
HCT: 40.7 % (ref 36.0–46.0)
Hemoglobin: 13.7 g/dL (ref 12.0–15.0)
MCH: 28.4 pg (ref 26.0–34.0)
MCHC: 33.7 g/dL (ref 30.0–36.0)
MCV: 84.4 fL (ref 80.0–100.0)
Platelets: 186 10*3/uL (ref 150–400)
RBC: 4.82 MIL/uL (ref 3.87–5.11)
RDW: 15 % (ref 11.5–15.5)
WBC: 9.1 10*3/uL (ref 4.0–10.5)
nRBC: 0 % (ref 0.0–0.2)

## 2019-09-21 LAB — GLUCOSE, CAPILLARY
Glucose-Capillary: 191 mg/dL — ABNORMAL HIGH (ref 70–99)
Glucose-Capillary: 164 mg/dL — ABNORMAL HIGH (ref 70–99)
Glucose-Capillary: 157 mg/dL — ABNORMAL HIGH (ref 70–99)

## 2019-09-21 MED ORDER — ENOXAPARIN SODIUM 100 MG/ML ~~LOC~~ SOLN
1.0000 mg/kg | Freq: Two times a day (BID) | SUBCUTANEOUS | Status: DC
  Administered 2019-09-21: 90 mg via SUBCUTANEOUS
  Filled 2019-09-21: qty 1

## 2019-09-21 MED ORDER — WARFARIN SODIUM 7.5 MG PO TABS
7.5000 mg | ORAL_TABLET | Freq: Once | ORAL | Status: DC
  Filled 2019-09-21: qty 1

## 2019-09-21 NOTE — Discharge Instructions (Signed)
Pacemaker Implantation, Adult, Care After This sheet gives you information about how to care for yourself after your procedure. Your health care provider may also give you more specific instructions. If you have problems or questions, contact your health care provider. What can I expect after the procedure? After the procedure, it is common to have:  Mild pain.  Slight bruising.  Some swelling over the incision.  A slight bump over the skin where the device was placed. Sometimes, it is possible to feel the device under the skin. This is normal. Follow these instructions at home: Medicines  Take over-the-counter and prescription medicines only as told by your health care provider.  If you were prescribed an antibiotic medicine, take it as told by your health care provider. Do not stop taking the antibiotic even if you start to feel better. Wound care   Do not remove the bandage on your chest until directed to do so by your health care provider.  After your bandage is removed, you may see pieces of tape called skin adhesive strips over the area where the cut was made (incision site). Let them fall off on their own.  Check the incision site every day to make sure it is not infected, bleeding, or starting to pull apart.  Do not use lotions or ointments near the incision site unless directed to do so.  Keep the incision area clean and dry for 2-3 days after the procedure or as directed by your health care provider. It takes several weeks for the incision site to completely heal.  Do not take baths, swim, or use a hot tub for 7-10 days or as otherwise directed by your health care provider. Activity  Do not drive or use heavy machinery while taking prescription pain medicine.  Do not drive for 24 hours if you were given a medicine to help you relax (sedative).  Check with your health care provider before you start to drive or play sports.  Avoid sudden jerking, pulling, or chopping  movements that pull your upper arm far away from your body. Avoid these movements for at least 6 weeks or as long as told by your health care provider.  Do not lift your upper arm above your shoulders for at least 6 weeks or as long as told by your health care provider. This means no tennis, golf, or swimming.  You may go back to work when your health care provider says it is okay. Pacemaker care  You may be shown how to transfer data from your pacemaker through the phone to your health care provider.  Always let all health care providers know about your pacemaker before you have any medical procedures or tests.  Wear a medical ID bracelet or necklace stating that you have a pacemaker. Carry a pacemaker ID card with you at all times.  Your pacemaker battery will last for 5-15 years. Routine checks by your health care provider will let the health care provider know when the battery is starting to run down. The pacemaker will need to be replaced when the battery starts to run down.  Do not use amateur Chief of Staff. Other electrical devices are safe to use, including power tools, lawn mowers, and speakers. If you are unsure of whether something is safe to use, ask your health care provider.  When using your cell phone, hold it to the ear opposite the pacemaker. Do not leave your cell phone in a pocket over the pacemaker.  Avoid places or objects that have a strong electric or magnetic field, including: ? Airport Herbalist. When at the airport, let officials know that you have a pacemaker. ? Power plants. ? Large electrical generators. ? Radiofrequency transmission towers, such as cell phone and radio towers. General instructions  Weigh yourself every day. If you suddenly gain weight, fluid may be building up in your body.  Keep all follow-up visits as told by your health care provider. This is important. Contact a health care provider if:  You gain  weight suddenly.  Your legs or feet swell.  It feels like your heart is fluttering or skipping beats (heart palpitations).  You have chills or a fever.  You have more redness, swelling, or pain around your incisions.  You have more fluid or blood coming from your incisions.  Your incisions feel warm to the touch.  You have pus or a bad smell coming from your incisions. Get help right away if:  You have chest pain.  You have trouble breathing or are short of breath.  You become extremely tired.  You are light-headed or you faint. This information is not intended to replace advice given to you by your health care provider. Make sure you discuss any questions you have with your health care provider. Document Released: 05/14/2005 Document Revised: 10/07/2017 Document Reviewed: 08/06/2016 Elsevier Patient Education  2020 [employer].

## 2019-09-21 NOTE — Consult Note (Signed)
[place] for Warfarin Dosing Indication: atrial fibrillation  Allergies  Allergen Reactions  . Gentamicin Other (See Comments)    Kidney function  . Morphine And Related Other (See Comments)    AMS - agitation and delusions **No legal documents to be signed if taking, per family**    Patient Measurements: Height: 5\' 7"  (170.2 cm) Weight: 193 lb (87.5 kg) IBW/kg (Calculated) : 61.6  Vital Signs: Temp: 98.1 F (36.7 C) (11/13 0900) Temp Source: Axillary (11/13 0200) BP: 135/88 (11/13 1200) Pulse Rate: 63 (11/13 1200)  Labs: Recent Labs    09/18/19 1349  09/19/19 0501 09/19/19 1215 09/19/19 1755 09/20/19 0601 09/21/19 0324  HGB  --   --  12.8  --   --   --  13.7  HCT  --   --  39.8  --   --   --  40.7  PLT  --   --  188  --   --   --  186  APTT  --   --   --  35  --   --   --   LABPROT  --    < > 25.6*  --  25.8* 20.8* 16.7*  INR  --    < > 2.4*  --  2.4* 1.8* 1.4*  CREATININE  --    < > 0.89  --   --  0.78 0.82  TROPONINIHS 10  --   --   --   --   --   --    < > = values in this interval not displayed.    Estimated Creatinine Clearance: 61.2 mL/min (by C-G formula based on SCr of 0.82 mg/dL).   Medical History: Past Medical History:  Diagnosis Date  . Anginal pain ([place])   . Anxiety   . Diabetes mellitus without complication ([place])   . Hypertension   . Hypothyroidism   . Protein C deficiency ([place])   . Protein S deficiency ([place])     Assessment: Sierra [name] is an 72 YOF with a PMH of CAD, protein C & S deficiency, HTN, hypothyroidism, T2DM, and anxiety. She was admitted on 09/18/2019 after presenting to the ED with complete heart block. Patient had an elevated INR and received vitamin K, warfarin was held. Pharmacy has been consulted for warfarin dosing.  Goal of Therapy:  - INR 2-3 - Monitor platelets by anticoagulation protocol: Yes   Plan:  - INR 1.4 @ 0324. - Will check INR tomorrow with AM labs. - Continue  7.5mg  of warfarin (approx 50% greater than average PTA dose). - Initiate Lovenox bridge 90mg  q12h until therapeutic INR x 2. - Will monitor H&H and platelets per protocol.  Thank you for allowing pharmacy to be a part of this patient's care.  Raiford Simmonds, PharmD Candidate 09/21/2019,1:17 PM

## 2019-09-21 NOTE — Discharge Summary (Signed)
Physician Discharge Summary  Sierra [name] XNT:[id number] DOB: 1936/04/17 DOA: 09/18/2019  PCP: Sierra Rossetti, PA-C  Admit date: 09/18/2019 Discharge date: 09/21/2019  Admitted From: Assisted living Disposition: Return to assisted living  Recommendations for Outpatient Follow-up:  Follow-up with cardiology in 1 week Please check INR in next 2-3 days and adjust Coumadin dose accordingly.  Home Health: None Equipment/Devices: None  Discharge Condition: Fair CODE STATUS: Full code Diet recommendation: Heart Healthy     Discharge Diagnoses:   Principal problem Complete heart block ([name])  Active Problems:   Hypothyroidism   Protein C deficiency ([name])   Protein S deficiency ([place]) Near syncope   Diabetes mellitus, controlled ([place])  Brief narrative/HPI 83 y.o.femalewith Past medical history ofCAD, protein C&S deficiency on coumadin, HTN, hypothyroidism, type 2 diabetes mellitus, anxiety presented to her PCP with 1 week of severe dizziness. EKG done there showed complete heart block, she was then referred to cardiology and and referred for admission.  Active Problems:   Complete heart block ([name]) INR reversed and underwent pacemaker placement on 11/12.  Tolerated well.  All home blood pressure meds resumed. 2D echo with normal LV function and grade 1 diastolic dysfunction.  Stable for discharge per cardiology and will arrange outpatient follow-up in 1-2 weeks.   Active symptoms Essential hypertension Including atenolol.  Protein C and protein S deficiency  Warfarin held and reversed for pacemaker placement.    Resumed.  INR subtherapeutic at 1.4.  Continue home dose and check INR in the next 2-3 days.  Type 2 diabetes mellitus with neuropathy. Resume home meds   Hypothyroidism  continuing Synthroid.  Neuropathy. Continue gabapentin and Cymbalta.  Hypokalemia  replaced     Family Communication   none  Disposition Plan  : Return to  ALF    Consults  :  cardiology  Procedures  : echo,  Medtronic Micra AV leadless pacemaker implantation  Discharge Instructions   Allergies as of 09/21/2019      Reactions   Gentamicin Other (See Comments)   Kidney function   Morphine And Related Other (See Comments)   AMS - agitation and delusions **No legal documents to be signed if taking, per family**      Medication List    TAKE these medications   alendronate 70 MG tablet Commonly known as: FOSAMAX Take 70 mg by mouth once a week.   amitriptyline 10 MG tablet Commonly known as: ELAVIL Take 10 mg by mouth at bedtime as needed for sleep.   amLODipine 2.5 MG tablet Commonly known as: NORVASC Take 2.5 mg by mouth daily.   Apriso 0.375 g 24 hr capsule Generic drug: mesalamine Take 1.5 g by mouth daily.   atenolol 25 MG tablet Commonly known as: TENORMIN Take 25 mg by mouth daily.   atorvastatin 40 MG tablet Commonly known as: LIPITOR Take 40 mg by mouth daily.   calcium carbonate 1500 (600 Ca) MG Tabs tablet Commonly known as: OSCAL Take 600 mg of elemental calcium by mouth daily.   cholecalciferol 25 MCG (1000 UT) tablet Commonly known as: VITAMIN D3 Take 3,000 Units by mouth daily. (taken with calcium)   DULoxetine 60 MG capsule Commonly known as: CYMBALTA Take 60 mg by mouth at bedtime.   gabapentin 100 MG capsule Commonly known as: NEURONTIN Take 100 mg by mouth at bedtime.   glipiZIDE 2.5 MG 24 hr tablet Commonly known as: GLUCOTROL XL Take 2.5 mg by mouth daily.   levothyroxine 88 MCG tablet Commonly known as:  SYNTHROID Take 88 mcg by mouth daily before breakfast.   lisinopril 40 MG tablet Commonly known as: ZESTRIL Take 40 mg by mouth daily.   loperamide 2 MG tablet Commonly known as: IMODIUM A-[name] Take 2 mg by mouth daily.   [facility] Health Caps Take 1 capsule by mouth daily.   Proctosol HC 2.5 % rectal cream Generic drug: hydrocortisone Apply 1 application topically  2 (two) times daily.   saccharomyces boulardii 250 MG capsule Commonly known as: FLORASTOR Take 250 mg by mouth daily.   sitaGLIPtin-metformin 50-500 MG tablet Commonly known as: JANUMET Take 2 tablets by mouth every evening.   warfarin 5 MG tablet Commonly known as: COUMADIN Take 5 mg by mouth daily. Tuesday and Thursday   warfarin 6 MG tablet Commonly known as: COUMADIN Take 6 mg by mouth as directed.      Follow-up Information    Sierra [name], [employer], PA-C. Schedule an appointment as soon as possible for a visit in 1 week(s).   Specialty: Family Medicine Contact information: [place] [address] [phone]        Sierra Amel D, MD Follow up in 1 week(s).   Specialties: Cardiology, Internal Medicine Contact information: [address] [place] [address] [phone]          Allergies  Allergen Reactions  . Gentamicin Other (See Comments)    Kidney function  . Morphine And Related Other (See Comments)    AMS - agitation and delusions **No legal documents to be signed if taking, per family**       Procedures/Studies: Dg Chest Port 1 View  Result Date: 09/18/2019 CLINICAL DATA:  Chest pain EXAM: PORTABLE CHEST 1 VIEW COMPARISON:  08/08/2019 chest radiograph. FINDINGS: Pacer pad overlies the left chest. Stable cardiomediastinal silhouette with normal heart size. No pneumothorax. No pleural effusion. Lungs appear clear, with no acute consolidative airspace disease and no pulmonary edema. IMPRESSION: No active disease. Electronically Signed   By: Ilona Sorrel M.[name].   On: 09/18/2019 12:50   Mm 3d Screen Breast Bilateral  Result Date: 08/27/2019 CLINICAL DATA:  Screening. EXAM: DIGITAL SCREENING BILATERAL MAMMOGRAM WITH TOMO AND CAD COMPARISON:  Previous exam(s). ACR Breast Density Category c: The breast tissue is heterogeneously dense, which may obscure small masses. FINDINGS: There are no findings suspicious for  malignancy. Images were processed with CAD. IMPRESSION: No mammographic evidence of malignancy. A result letter of this screening mammogram will be mailed directly to the patient. RECOMMENDATION: Screening mammogram in one year. (Code:[id number]) BI-RADS CATEGORY  1: Negative. Electronically Signed   By: Lovey Newcomer M.[name].   On: 08/27/2019 11:13       Subjective: No overnight events.  Heart rate stable on the monitor.  Tolerated pacemaker insertion well  Discharge Exam: Vitals:   09/21/19 1100 09/21/19 1200  BP: (!) 138/92 135/88  Pulse: 68 63  Resp: 16 15  Temp:    SpO2: 95% 97%   Vitals:   09/21/19 0952 09/21/19 1000 09/21/19 1100 09/21/19 1200  BP: 126/64 (!) 141/80 (!) 138/92 135/88  Pulse: 68 69 68 63  Resp:  (!) 23 16 15   Temp:      TempSrc:      SpO2:  100% 95% 97%  Weight:      Height:        General: Not in distress HEENT: Moist mucosa, supple neck Chest: Clear bilaterally CVs: Normal S1-S2 GI: Soft, nondistended nontender Musculoskeletal: Warm, no edema    The  results of significant diagnostics from this hospitalization (including imaging, microbiology, ancillary and laboratory) are listed below for reference.     Microbiology: Recent Results (from the past 240 hour(s))  SARS CORONAVIRUS 2 (TAT 6-24 HRS) Nasopharyngeal Nasopharyngeal Swab     Status: None   Collection Time: 09/18/19 12:35 PM   Specimen: Nasopharyngeal Swab  Result Value Ref Range Status   SARS Coronavirus 2 NEGATIVE NEGATIVE Final    Comment: (NOTE) SARS-CoV-2 target nucleic acids are NOT DETECTED. The SARS-CoV-2 RNA is generally detectable in upper and lower respiratory specimens during the acute phase of infection. Negative results do not preclude SARS-CoV-2 infection, do not rule out co-infections with other pathogens, and should not be used as the sole basis for treatment or other patient management decisions. Negative results must be combined with clinical observations, patient  history, and epidemiological information. The expected result is Negative. Fact Sheet for Patients: SugarRoll.be Fact Sheet for Healthcare Providers: https://www.woods-mathews.com/ This test is not yet approved or cleared by the [place] FDA and  has been authorized for detection and/or diagnosis of SARS-CoV-2 by FDA under an Emergency Use Authorization (EUA). This EUA will remain  in effect (meaning this test can be used) for the duration of the COVID-19 declaration under Section 56 4(b)(1) of the Act, 21 U.S.C. section 360bbb-3(b)(1), unless the authorization is terminated or revoked sooner. Performed at [facility], [place] [address], [place], [place] [address]   MRSA PCR Screening     Status: None   Collection Time: 09/18/19 11:01 PM   Specimen: Nasal Mucosa; Nasopharyngeal  Result Value Ref Range Status   MRSA by PCR NEGATIVE NEGATIVE Final    Comment:        The GeneXpert MRSA Assay (FDA approved for NASAL specimens only), is one component of a comprehensive MRSA colonization surveillance program. It is not intended to diagnose MRSA infection nor to guide or monitor treatment for MRSA infections. Performed at [facility], [address] [place], [place] [address]      Labs: BNP (last 3 results) No results for input(s): BNP in the last 8760 hours. Basic Metabolic Panel: Recent Labs  Lab 09/18/19 1153 09/18/19 1741 09/19/19 0501 09/20/19 0601 09/21/19 0324  NA 142 141 144 140 140  K 3.4* 3.3* 3.9 3.8 3.4*  CL 110 108 114* 111 109  CO2 22 24 21* 23 23  GLUCOSE 109* 190* 161* 160* 163*  BUN 34* 33* 33* 25* 17  CREATININE 1.06* 1.03* 0.89 0.78 0.82  CALCIUM 9.1 9.2 9.2 8.6* 8.9  MG 1.6* 2.2  --   --   --    Liver Function Tests: Recent Labs  Lab 09/19/19 0501  AST 14*  ALT 12  ALKPHOS 38  BILITOT 0.8  PROT 6.8  ALBUMIN 3.7   No results for input(s): LIPASE, AMYLASE in the last 168  hours. No results for input(s): AMMONIA in the last 168 hours. CBC: Recent Labs  Lab 09/18/19 1153 09/19/19 0501 09/21/19 0324  WBC 8.4 10.4 9.1  HGB 12.8 12.8 13.7  HCT 40.1 39.8 40.7  MCV 89.9 89.4 84.4  PLT 205 188 186   Cardiac Enzymes: No results for input(s): CKTOTAL, CKMB, CKMBINDEX, TROPONINI in the last 168 hours. BNP: Invalid input(s): POCBNP CBG: Recent Labs  Lab 09/20/19 1149 09/20/19 1609 09/20/19 2128 09/21/19 0735 09/21/19 1150  GLUCAP 107* 111* 191* 164* 157*   [name]-Dimer No results for input(s): DDIMER in the last 72 hours. Hgb A1c No results  for input(s): HGBA1C in the last 72 hours. Lipid Profile No results for input(s): CHOL, HDL, LDLCALC, TRIG, CHOLHDL, LDLDIRECT in the last 72 hours. Thyroid function studies Recent Labs    09/18/19 1741  TSH 1.148   Anemia work up No results for input(s): VITAMINB12, FOLATE, FERRITIN, TIBC, IRON, RETICCTPCT in the last 72 hours. Urinalysis    Component Value Date/Time   COLORURINE AMBER (A) 09/18/2019 1805   APPEARANCEUR TURBID (A) 09/18/2019 1805   LABSPEC 1.021 09/18/2019 1805   PHURINE 7.0 09/18/2019 1805   GLUCOSEU NEGATIVE 09/18/2019 1805   HGBUR NEGATIVE 09/18/2019 1805   BILIRUBINUR NEGATIVE 09/18/2019 1805   KETONESUR NEGATIVE 09/18/2019 1805   PROTEINUR 100 (A) 09/18/2019 1805   NITRITE NEGATIVE 09/18/2019 1805   LEUKOCYTESUR SMALL (A) 09/18/2019 1805   Sepsis Labs Invalid input(s): PROCALCITONIN,  WBC,  LACTICIDVEN Microbiology Recent Results (from the past 240 hour(s))  SARS CORONAVIRUS 2 (TAT 6-24 HRS) Nasopharyngeal Nasopharyngeal Swab     Status: None   Collection Time: 09/18/19 12:35 PM   Specimen: Nasopharyngeal Swab  Result Value Ref Range Status   SARS Coronavirus 2 NEGATIVE NEGATIVE Final    Comment: (NOTE) SARS-CoV-2 target nucleic acids are NOT DETECTED. The SARS-CoV-2 RNA is generally detectable in upper and lower respiratory specimens during the acute phase of infection.  Negative results do not preclude SARS-CoV-2 infection, do not rule out co-infections with other pathogens, and should not be used as the sole basis for treatment or other patient management decisions. Negative results must be combined with clinical observations, patient history, and epidemiological information. The expected result is Negative. Fact Sheet for Patients: SugarRoll.be Fact Sheet for Healthcare Providers: https://www.woods-mathews.com/ This test is not yet approved or cleared by the [place] FDA and  has been authorized for detection and/or diagnosis of SARS-CoV-2 by FDA under an Emergency Use Authorization (EUA). This EUA will remain  in effect (meaning this test can be used) for the duration of the COVID-19 declaration under Section 56 4(b)(1) of the Act, 21 U.S.C. section 360bbb-3(b)(1), unless the authorization is terminated or revoked sooner. Performed at [facility], [place] [address] [place], [place] [address]   MRSA PCR Screening     Status: None   Collection Time: 09/18/19 11:01 PM   Specimen: Nasal Mucosa; Nasopharyngeal  Result Value Ref Range Status   MRSA by PCR NEGATIVE NEGATIVE Final    Comment:        The GeneXpert MRSA Assay (FDA approved for NASAL specimens only), is one component of a comprehensive MRSA colonization surveillance program. It is not intended to diagnose MRSA infection nor to guide or monitor treatment for MRSA infections. Performed at [facility], [address], [place], [place] [address]      Time coordinating discharge: 35 minutes  SIGNED:   Louellen Molder, MD  [facility] 09/21/2019, 12:57 PM Pager   If 7PM-7AM, please contact night-coverage [email or web address] Password [email or web address]

## 2019-09-21 NOTE — Progress Notes (Signed)
54 Discharged home with daughter. All valuables with patient including her dentures and walker. All questions answered and discharge instructions explained to patient. Patient is returning to independent living at [facility].

## 2019-10-01 DIAGNOSIS — Z950 Presence of cardiac pacemaker: Secondary | ICD-10-CM | POA: Insufficient documentation

## 2019-11-03 ENCOUNTER — Other Ambulatory Visit
Admission: RE | Admit: 2019-11-03 | Discharge: 2019-11-03 | Disposition: A | Discharge status: Home / Self Care | Payer: Medicare Other | Source: Ambulatory Visit | Attending: Family Medicine | Admitting: Family Medicine

## 2019-11-03 DIAGNOSIS — Z7901 Long term (current) use of anticoagulants: Secondary | ICD-10-CM | POA: Diagnosis not present

## 2019-11-03 DIAGNOSIS — Z5181 Encounter for therapeutic drug level monitoring: Secondary | ICD-10-CM | POA: Diagnosis present

## 2019-11-03 LAB — PROTIME-INR
INR: 2.3 — ABNORMAL HIGH (ref 0.8–1.2)
Prothrombin Time: 25 seconds — ABNORMAL HIGH (ref 11.4–15.2)

## 2020-02-07 ENCOUNTER — Ambulatory Visit (INDEPENDENT_AMBULATORY_CARE_PROVIDER_SITE_OTHER): Payer: Medicare Other | Admitting: Vascular Surgery

## 2020-02-07 ENCOUNTER — Other Ambulatory Visit: Payer: Self-pay

## 2020-02-07 ENCOUNTER — Encounter (INDEPENDENT_AMBULATORY_CARE_PROVIDER_SITE_OTHER): Payer: Self-pay | Admitting: Vascular Surgery

## 2020-02-07 VITALS — BP 119/72 | HR 80 | Resp 16 | Wt 193.8 lb

## 2020-02-07 DIAGNOSIS — E119 Type 2 diabetes mellitus without complications: Secondary | ICD-10-CM

## 2020-02-07 DIAGNOSIS — I872 Venous insufficiency (chronic) (peripheral): Secondary | ICD-10-CM | POA: Diagnosis not present

## 2020-02-07 DIAGNOSIS — I890 Lymphedema, not elsewhere classified: Secondary | ICD-10-CM | POA: Diagnosis not present

## 2020-02-07 DIAGNOSIS — I10 Essential (primary) hypertension: Secondary | ICD-10-CM

## 2020-02-07 DIAGNOSIS — E782 Mixed hyperlipidemia: Secondary | ICD-10-CM

## 2020-02-09 ENCOUNTER — Encounter (INDEPENDENT_AMBULATORY_CARE_PROVIDER_SITE_OTHER): Payer: Self-pay | Admitting: Vascular Surgery

## 2020-02-09 DIAGNOSIS — I890 Lymphedema, not elsewhere classified: Secondary | ICD-10-CM | POA: Insufficient documentation

## 2020-02-09 DIAGNOSIS — I872 Venous insufficiency (chronic) (peripheral): Secondary | ICD-10-CM | POA: Insufficient documentation

## 2020-02-09 NOTE — Progress Notes (Signed)
MRN : [id number]  Sierra [name] is a 84 y.o. (1936/07/12) female who presents with chief complaint of  Chief Complaint  Patient presents with  . New Patient (Initial Visit)    ref Venetia Maxon le edema  .  History of Present Illness:   Patient is seen for evaluation of leg pain and leg swelling. The patient first noticed the swelling remotely. The swelling is associated with pain and discoloration. The pain and swelling worsens with prolonged dependency and improves with elevation. The pain is unrelated to activity.  The patient notes that in the morning the legs are significantly improved but they steadily worsened throughout the course of the day. The patient also notes a steady worsening of the discoloration in the ankle and shin area.   The patient denies claudication symptoms.  The patient denies symptoms consistent with rest pain.  The patient denies and extensive history of DJD and LS spine disease.  The patient has no had any past angiography, interventions or vascular surgery.  Elevation makes the leg symptoms better, dependency makes them much worse. There is no history of ulcerations. The patient denies any recent changes in medications.  The patient has not been wearing graduated compression.  The patient has a history of DVT. There is no prior history of phlebitis.  No history of malignancies. No history of trauma or groin or pelvic surgery. There is no history of radiation treatment to the groin or pelvis  The patient denies amaurosis fugax or recent TIA symptoms. There are no recent neurological changes noted. The patient denies recent episodes of angina or shortness of breath  Current Meds  Medication Sig  . alendronate (FOSAMAX) 70 MG tablet Take 70 mg by mouth once a week.  Marland Kitchen amLODipine (NORVASC) 2.5 MG tablet Take 2.5 mg by mouth daily.  . APRISO 0.375 g 24 hr capsule Take 1.5 g by mouth daily.  Marland Kitchen atenolol (TENORMIN) 25 MG tablet Take 25 mg by mouth daily.  Marland Kitchen  atorvastatin (LIPITOR) 40 MG tablet Take 40 mg by mouth daily.  . calcium carbonate (OSCAL) 1500 (600 Ca) MG TABS tablet Take 600 mg of elemental calcium by mouth daily.   . cholecalciferol (VITAMIN D3) 25 MCG (1000 UT) tablet Take 3,000 Units by mouth daily. (taken with calcium)  . colestipol (COLESTID) 1 g tablet Take by mouth.  . DULoxetine (CYMBALTA) 60 MG capsule Take 60 mg by mouth at bedtime.   . furosemide (LASIX) 20 MG tablet Take by mouth.  Marland Kitchen glipiZIDE (GLUCOTROL XL) 2.5 MG 24 hr tablet Take 2.5 mg by mouth daily.  Marland Kitchen glucose blood (ACCU-CHEK SMARTVIEW) test strip 1 Each by Misc.(Non-Drug; Combo Route) route Once a Day. NIDDM-Dx:250.00, HgbA1c 6.3-6/25/13 & last ov 01/07/12.  Marland Kitchen glucose blood (ONETOUCH ULTRA) test strip Use once daily Diagnosis: E11.29  . hydrocortisone (PROCTOSOL HC) 2.5 % rectal cream Apply 1 application topically 2 (two) times daily.  Marland Kitchen levothyroxine (SYNTHROID, LEVOTHROID) 88 MCG tablet Take 88 mcg by mouth daily before breakfast.  . lisinopril (PRINIVIL,ZESTRIL) 40 MG tablet Take 40 mg by mouth daily.  Marland Kitchen MELATONIN PO Take by mouth at bedtime.  . pregabalin (LYRICA) 75 MG capsule Take by mouth.  . sitaGLIPtin-metformin (JANUMET) 50-500 MG per tablet Take 2 tablets by mouth every evening.   Marland Kitchen UNABLE TO FIND daily. Lacto.acidophiluis-Bif.animals 31 billion cell cap  . warfarin (COUMADIN) 5 MG tablet Take 5 mg by mouth as directed. Tuesday and Thursday    Past Medical History:  Diagnosis Date  .  Anginal pain ([place])   . Anxiety   . Diabetes mellitus without complication ([place])   . Hypertension   . Hypothyroidism   . Protein C deficiency ([place])   . Protein S deficiency [facility])     Past Surgical History:  Procedure Laterality Date  . BACK SURGERY    . BREAST EXCISIONAL BIOPSY Left 2003?   benign  . COLONOSCOPY N/A 09/12/2017   Procedure: COLONOSCOPY;  Surgeon: Lollie Sails, MD;  Location: [facility] ENDOSCOPY;  Service: Endoscopy;  Laterality: N/A;  . COLONOSCOPY  WITH PROPOFOL N/A 01/20/2016   Procedure: COLONOSCOPY WITH PROPOFOL;  Surgeon: Lollie Sails, MD;  Location: [facility] ENDOSCOPY;  Service: Endoscopy;  Laterality: N/A;  . FRACTURE SURGERY    . HIP ARTHROPLASTY Left 01/12/2019   Procedure: ARTHROPLASTY BIPOLAR HIP (HEMIARTHROPLASTY), LEFT;  Surgeon: Leim Fabry, MD;  Location: [facility] ORS;  Service: Orthopedics;  Laterality: Left;  . PACEMAKER LEADLESS INSERTION N/A 09/20/2019   Procedure: PACEMAKER LEADLESS INSERTION;  Surgeon: Isaias Cowman, MD;  Location: [place] CV LAB;  Service: Cardiovascular;  Laterality: N/A;  . PERIPHERAL VASCULAR CATHETERIZATION N/A 05/13/2015   Procedure: IVC Filter Removal;  Surgeon: Katha Cabal, MD;  Location: [place] CV LAB;  Service: Cardiovascular;  Laterality: N/A;  . TEMPORARY PACEMAKER Right 09/20/2019   Procedure: TEMPORARY PACEMAKER;  Surgeon: Isaias Cowman, MD;  Location: [place] CV LAB;  Service: Cardiovascular;  Laterality: Right;  . TONSILLECTOMY      Social History Social History   Tobacco Use  . Smoking status: Former Research scientist (life sciences)  . Smokeless tobacco: Never Used  Substance Use Topics  . Alcohol use: No  . Drug use: No    Family History Family History  Problem Relation Age of Onset  . Breast cancer Sister   No family history of bleeding/clotting disorders, porphyria or autoimmune disease   Allergies  Allergen Reactions  . Gentamicin Other (See Comments)    Kidney function  . Morphine And Related Other (See Comments)    AMS - agitation and delusions **No legal documents to be signed if taking, per family**     REVIEW OF SYSTEMS (Negative unless checked)  Constitutional: [] Weight loss  [] Fever  [] Chills Cardiac: [] Chest pain   [] Chest pressure   [] Palpitations   [] Shortness of breath when laying flat   [] Shortness of breath with exertion. Vascular:  [] Pain in legs with walking   [] Pain in legs at rest  [x] History of DVT   [] Phlebitis   [] Swelling in legs    [] Varicose veins   [] Non-healing ulcers Pulmonary:   [] Uses home oxygen   [] Productive cough   [] Hemoptysis   [] Wheeze  [] COPD   [] Asthma Neurologic:  [] Dizziness   [] Seizures   [] History of stroke   [] History of TIA  [] Aphasia   [] Vissual changes   [] Weakness or numbness in arm   [] Weakness or numbness in leg Musculoskeletal:   [] Joint swelling   [x] Joint pain   [] Low back pain Hematologic:  [] Easy bruising  [] Easy bleeding   [x] Hypercoagulable state   [] Anemic Gastrointestinal:  [] Diarrhea   [] Vomiting  [] Gastroesophageal reflux/heartburn   [] Difficulty swallowing. Genitourinary:  [] Chronic kidney disease   [] Difficult urination  [] Frequent urination   [] Blood in urine Skin:  [] Rashes   [] Ulcers  Psychological:  [] History of anxiety   []  History of major depression.  Physical Examination  Vitals:   02/07/20 1130  BP: 119/72  Pulse: 80  Resp: 16  Weight: 193 lb 12.8 oz (87.9 kg)   Body mass  index is 30.35 kg/m. Gen: WD/WN, NAD Head: [place]/AT, No temporalis wasting.  Ear/Nose/Throat: Hearing grossly intact, nares w/o erythema or drainage, poor dentition Eyes: PER, EOMI, sclera nonicteric.  Neck: Supple, no masses.  No bruit or JVD.  Pulmonary:  Good air movement, clear to auscultation bilaterally, no use of accessory muscles.  Cardiac: RRR, normal S1, S2, no Murmurs. Vascular:scattered varicosities present bilaterally.  Mild venous stasis changes to the legs bilaterally.  2+ soft pitting edema. Vessel Right Left  Radial Palpable Palpable  Gastrointestinal: soft, non-distended. No guarding/no peritoneal signs.  Musculoskeletal: M/S 5/5 throughout.  No deformity or atrophy.  Neurologic: CN 2-12 intact. Pain and light touch intact in extremities.  Symmetrical.  Speech is fluent. Motor exam as listed above. Psychiatric: Judgment intact, Mood & affect appropriate for pt's clinical situation. Dermatologic: No rashes or ulcers noted.  No changes consistent with cellulitis.   CBC Lab  Results  Component Value Date   WBC 9.1 09/21/2019   HGB 13.7 09/21/2019   HCT 40.7 09/21/2019   MCV 84.4 09/21/2019   PLT 186 09/21/2019    BMET    Component Value Date/Time   NA 140 09/21/2019 0324   NA 140 03/03/2015 0600   K 3.4 (L) 09/21/2019 0324   K 3.3 (L) 03/03/2015 0600   CL 109 09/21/2019 0324   CL 101 03/03/2015 0600   CO2 23 09/21/2019 0324   CO2 32 03/03/2015 0600   GLUCOSE 163 (H) 09/21/2019 0324   GLUCOSE 165 (H) 03/03/2015 0600   BUN 17 09/21/2019 0324   BUN 13 03/03/2015 0600   CREATININE 0.82 09/21/2019 0324   CREATININE 0.67 03/03/2015 0600   CALCIUM 8.9 09/21/2019 0324   CALCIUM 9.0 03/03/2015 0600   GFRNONAA >60 09/21/2019 0324   GFRNONAA >60 03/03/2015 0600   GFRAA >60 09/21/2019 0324   GFRAA >60 03/03/2015 0600   CrCl cannot be calculated (Patient's most recent lab result is older than the maximum 21 days allowed.).  COAG Lab Results  Component Value Date   INR 2.3 (H) 11/03/2019   INR 1.4 (H) 09/21/2019   INR 1.8 (H) 09/20/2019    Radiology No results found.    Assessment/Plan 1. Chronic venous insufficiency No surgery or intervention at this point in time.  I have reviewed my discussion with the patient regarding venous insufficiency and why it causes symptoms. I have discussed with the patient the chronic skin changes that accompany venous insufficiency and the long term sequela such as ulceration. Patient will contnue wearing graduated compression stockings on a daily basis, as this has provided excellent control of his edema. The patient will put the stockings on first thing in the morning and removing them in the evening. The patient is reminded not to sleep in the stockings.  In addition, behavioral modification including elevation during the day will be initiated. Exercise is strongly encouraged.  Given the patient's good control and lack of any problems regarding the venous insufficiency and lymphedema a lymph pump in not need  at this time.  The patient will follow up with me PRN should anything change.  The patient voices agreement with this plan.   2. Lymphedema No surgery or intervention at this point in time.  I have reviewed my discussion with the patient regarding venous insufficiency and why it causes symptoms. I have discussed with the patient the chronic skin changes that accompany venous insufficiency and the long term sequela such as ulceration. Patient will contnue wearing graduated compression stockings on  a daily basis, as this has provided excellent control of his edema. The patient will put the stockings on first thing in the morning and removing them in the evening. The patient is reminded not to sleep in the stockings.  In addition, behavioral modification including elevation during the day will be initiated. Exercise is strongly encouraged.  Given the patient's good control and lack of any problems regarding the venous insufficiency and lymphedema a lymph pump in not need at this time.  The patient will follow up with me PRN should anything change.  The patient voices agreement with this plan.   3. Mixed hyperlipidemia Continue statin as ordered and reviewed, no changes at this time   4. Controlled type 2 diabetes mellitus without complication, without long-term current use of insulin ([name]) Continue hypoglycemic medications as already ordered, these medications have been reviewed and there are no changes at this time.  Hgb A1C to be monitored as already arranged by primary service   5. Essential hypertension Continue antihypertensive medications as already ordered, these medications have been reviewed and there are no changes at this time.     Hortencia Pilar, MD  02/09/2020 11:56 AM

## 2020-06-15 ENCOUNTER — Emergency Department: Payer: Medicare Other

## 2020-06-15 ENCOUNTER — Other Ambulatory Visit: Payer: Self-pay

## 2020-06-15 ENCOUNTER — Emergency Department
Admission: EM | Admit: 2020-06-15 | Discharge: 2020-06-15 | Disposition: A | Discharge status: Home / Self Care | Payer: Medicare Other | Attending: Emergency Medicine | Admitting: Emergency Medicine

## 2020-06-15 DIAGNOSIS — E1122 Type 2 diabetes mellitus with diabetic chronic kidney disease: Secondary | ICD-10-CM | POA: Diagnosis not present

## 2020-06-15 DIAGNOSIS — Z79899 Other long term (current) drug therapy: Secondary | ICD-10-CM | POA: Insufficient documentation

## 2020-06-15 DIAGNOSIS — N179 Acute kidney failure, unspecified: Secondary | ICD-10-CM | POA: Insufficient documentation

## 2020-06-15 DIAGNOSIS — E039 Hypothyroidism, unspecified: Secondary | ICD-10-CM | POA: Insufficient documentation

## 2020-06-15 DIAGNOSIS — Z87891 Personal history of nicotine dependence: Secondary | ICD-10-CM | POA: Insufficient documentation

## 2020-06-15 DIAGNOSIS — I129 Hypertensive chronic kidney disease with stage 1 through stage 4 chronic kidney disease, or unspecified chronic kidney disease: Secondary | ICD-10-CM | POA: Diagnosis not present

## 2020-06-15 DIAGNOSIS — W010XXA Fall on same level from slipping, tripping and stumbling without subsequent striking against object, initial encounter: Secondary | ICD-10-CM | POA: Diagnosis not present

## 2020-06-15 DIAGNOSIS — N182 Chronic kidney disease, stage 2 (mild): Secondary | ICD-10-CM | POA: Diagnosis not present

## 2020-06-15 DIAGNOSIS — Y9389 Activity, other specified: Secondary | ICD-10-CM | POA: Diagnosis not present

## 2020-06-15 DIAGNOSIS — S0990XA Unspecified injury of head, initial encounter: Secondary | ICD-10-CM | POA: Diagnosis not present

## 2020-06-15 DIAGNOSIS — Y999 Unspecified external cause status: Secondary | ICD-10-CM | POA: Diagnosis not present

## 2020-06-15 DIAGNOSIS — Y92002 Bathroom of unspecified non-institutional (private) residence single-family (private) house as the place of occurrence of the external cause: Secondary | ICD-10-CM | POA: Insufficient documentation

## 2020-06-15 DIAGNOSIS — Z7901 Long term (current) use of anticoagulants: Secondary | ICD-10-CM | POA: Diagnosis not present

## 2020-06-15 DIAGNOSIS — E860 Dehydration: Secondary | ICD-10-CM

## 2020-06-15 LAB — BASIC METABOLIC PANEL
Anion gap: 7 (ref 5–15)
BUN: 30 mg/dL — ABNORMAL HIGH (ref 8–23)
CO2: 25 mmol/L (ref 22–32)
Calcium: 9 mg/dL (ref 8.9–10.3)
Chloride: 109 mmol/L (ref 98–111)
Creatinine, Ser: 1.41 mg/dL — ABNORMAL HIGH (ref 0.44–1.00)
GFR calc Af Amer: 40 mL/min — ABNORMAL LOW (ref >60)
GFR calc non Af Amer: 35 mL/min — ABNORMAL LOW (ref >60)
Glucose, Bld: 92 mg/dL (ref 70–99)
Potassium: 3.4 mmol/L — ABNORMAL LOW (ref 3.5–5.1)
Sodium: 141 mmol/L (ref 135–145)

## 2020-06-15 LAB — CBC
HCT: 39.2 % (ref 36.0–46.0)
Hemoglobin: 12.5 g/dL (ref 12.0–15.0)
MCH: 29.1 pg (ref 26.0–34.0)
MCHC: 31.9 g/dL (ref 30.0–36.0)
MCV: 91.4 fL (ref 80.0–100.0)
Platelets: 178 10*3/uL (ref 150–400)
RBC: 4.29 MIL/uL (ref 3.87–5.11)
RDW: 14.4 % (ref 11.5–15.5)
WBC: 8.1 10*3/uL (ref 4.0–10.5)
nRBC: 0 % (ref 0.0–0.2)

## 2020-06-15 LAB — CK: Total CK: 62 U/L (ref 38–234)

## 2020-06-15 LAB — PROTIME-INR
INR: 2.4 — ABNORMAL HIGH (ref 0.8–1.2)
Prothrombin Time: 25.6 seconds — ABNORMAL HIGH (ref 11.4–15.2)

## 2020-06-15 NOTE — ED Provider Notes (Signed)
[facility] Emergency Department Provider Note   ____________________________________________   First MD Initiated Contact with Patient 06/15/20 2010     (approximate)  I have reviewed the triage vital signs and the nursing notes.   HISTORY  Chief Complaint Fall    HPI Sierra [name] is a 84 y.o. female history diabetes and protein C&S deficiency on Coumadin  Patient reports that Friday she believes she tripped in the bathroom and fell striking her face.  She had some bruising on her forehead but then today noticed that it look like she had 2 black eyes.  Bruising had spread.  She does take Coumadin.  She is not had any trouble with her mouth or neck.  No pain anywhere except she is little sore across the top of her forehead where it slightly swollen.  No visual changes.  No recent illnesses.  She does take medicine for her diabetes as well as her Coumadin and fluid pills for chronic edema  No chest pain or trouble breathing.  She does not believe anything caused her to fall other than tripping in the bathroom  Injury occurred on Friday, but she came today as she noticed the bruising over her face seem more prominent and much darker   Past Medical History:  Diagnosis Date   Anginal pain ([place])    Anxiety    Diabetes mellitus without complication ([place])    Hypertension    Hypothyroidism    Protein C deficiency ([place])    Protein S deficiency ([place])     Patient Active Problem List   Diagnosis Date Noted   Chronic venous insufficiency 02/09/2020   Lymphedema 02/09/2020   Cardiac pacemaker 10/01/2019   Syncope 09/21/2019   Diabetes mellitus, controlled ([place]) 09/21/2019   Complete heart block ([place]) 09/18/2019   Sepsis ([place]) 08/08/2019   UTI (urinary tract infection) 08/08/2019   Hypothyroidism 08/08/2019   HTN (hypertension) 08/08/2019   Diabetes ([place]) 08/08/2019   Protein C deficiency ([place]) 08/08/2019   Protein S deficiency  ([place]) 08/08/2019   Left leg pain 08/06/2019   Hip fracture ([place]) 01/11/2019   Lumbar radiculopathy 09/04/2018   Abnormal EKG 08/14/2018   Pre-op evaluation 08/14/2018   Anticoagulant long-term use 01/24/2018   Poorly-controlled hypertension 01/24/2018   Osteoporosis with current pathological fracture 09/16/2017   Post-menopausal osteoporosis 09/16/2017   CKD (chronic kidney disease) stage 2, GFR 60-89 ml/min 07/17/2017   Closed compression fracture of L1 vertebra ([place]) 10/22/2016   Closed fracture of left scapula 10/22/2016   Closed fracture of sacrum ([place]) 10/22/2016   Closed fracture of sternum 10/22/2016   Multiple rib fractures 10/22/2016   Trimalleolar fracture of ankle, closed, left, with routine healing, subsequent encounter 03/11/2015   Deep vein thrombosis (DVT) of right lower extremity ([place]) 11/19/2014   Irritable bowel syndrome with diarrhea 11/19/2014   Closed fracture of phalanx of foot 05/15/2013   Knee injury 01/11/2012   Osteopenia 09/18/2011   Hypercalcemia 04/12/2011   Acute thromboembolism of deep veins of lower extremity ([place]) 02/20/2010   Diabetic nephropathy ([place]) 02/20/2010   Mixed hyperlipidemia 02/20/2010   Vitamin D deficiency 02/20/2010    Past Surgical History:  Procedure Laterality Date   BACK SURGERY     BREAST EXCISIONAL BIOPSY Left 2003?   benign   COLONOSCOPY N/A 09/12/2017   Procedure: COLONOSCOPY;  Surgeon: Lollie Sails, MD;  [facility] END[facility]COPY;  Service: Endoscopy;  Laterality: N/A;   COLONOSCOPY WITH PROPOFOL N/A 01/20/2016   Procedure: COLONOSCOPY WITH  PROPOFOL;  Surgeon: Lollie Sails, MD;  Location: [facility] ENDOSCOPY;  Service: Endoscopy;  Laterality: N/A;   FRACTURE SURGERY     HIP ARTHROPLASTY Left 01/12/2019   Procedure: ARTHROPLASTY BIPOLAR HIP (HEMIARTHROPLASTY), LEFT;  Surgeon: Leim Fabry, MD;  Location: [facility] ORS;  Service: Orthopedics;  Laterality: Left;   PACEMAKER LEADLESS INSERTION  N/A 09/20/2019   Procedure: PACEMAKER LEADLESS INSERTION;  Surgeon: Isaias Cowman, MD;  Location: [facility] CV LAB;  Service: Cardiovascular;  Laterality: N/A;   PERIPHERAL VASCULAR CATHETERIZATION N/A 05/13/2015   Procedure: IVC Filter Removal;  Surgeon: Katha Cabal, MD;  Location: [facility] CV LAB;  Service: Cardiovascular;  Laterality: N/A;   TEMPORARY PACEMAKER Right 09/20/2019   Procedure: TEMPORARY PACEMAKER;  Surgeon: Isaias Cowman, MD;  Location: [facility] CV LAB;  Service: Cardiovascular;  Laterality: Right;   TONSILLECTOMY      Prior to Admission medications   Medication Sig Start Date End Date Taking? Authorizing Provider  alendronate (FOSAMAX) 70 MG tablet Take 70 mg by mouth once a week. 11/20/18   [provider]  amitriptyline (ELAVIL) 10 MG tablet Take 10 mg by mouth at bedtime as needed for sleep. 12/08/18   [provider]  amLODipine (NORVASC) 2.5 MG tablet Take 2.5 mg by mouth daily.    [provider]  APRISO 0.375 g 24 hr capsule Take 1.5 g by mouth daily. 11/26/18   [provider]  atenolol (TENORMIN) 25 MG tablet Take 25 mg by mouth daily.    [provider]  atorvastatin (LIPITOR) 40 MG tablet Take 40 mg by mouth daily.    [provider]  calcium carbonate (OSCAL) 1500 (600 Ca) MG TABS tablet Take 600 mg of elemental calcium by mouth daily.     [provider]  cholecalciferol (VITAMIN D3) 25 MCG (1000 UT) tablet Take 3,000 Units by mouth daily. (taken with calcium)    [provider]  colestipol (COLESTID) 1 g tablet Take by mouth. 10/25/19 10/24/20  [provider]  DULoxetine (CYMBALTA) 60 MG capsule Take 60 mg by mouth at bedtime.     [provider]  furosemide (LASIX) 20 MG tablet Take by mouth. 12/28/19 12/27/20  [provider]  gabapentin (NEURONTIN) 100 MG capsule Take 100 mg by mouth at bedtime. 08/23/19   [provider]    glipiZIDE (GLUCOTROL XL) 2.5 MG 24 hr tablet Take 2.5 mg by mouth daily. 11/11/18   [provider]  glucose blood (ACCU-CHEK SMARTVIEW) test strip 1 Each by Misc.(Non-Drug; Combo Route) route Once a Day. NIDDM-Dx:250.00, HgbA1c 6.3-6/25/13 & last ov 01/07/12. 10/11/12   [provider]  glucose blood (ONETOUCH ULTRA) test strip Use once daily Diagnosis: E11.29 10/23/18   [provider]  hydrocortisone (PROCTOSOL HC) 2.5 % rectal cream Apply 1 application topically 2 (two) times daily. 06/19/19   [provider]  hydrocortisone 2.5 % cream Apply topically. 06/19/19   [provider]  levothyroxine (SYNTHROID, LEVOTHROID) 88 MCG tablet Take 88 mcg by mouth daily before breakfast.    [provider]  lisinopril (PRINIVIL,ZESTRIL) 40 MG tablet Take 40 mg by mouth daily. 12/05/18   [provider]  loperamide (IMODIUM A-D) 2 MG tablet Take 2 mg by mouth daily.    [provider]  MELATONIN PO Take by mouth at bedtime.    [provider]  pregabalin (LYRICA) 75 MG capsule Take by mouth. 12/19/19 12/18/20  [provider]  Probiotic Product ([place]) CAPS  Take 1 capsule by mouth daily.    [provider]  saccharomyces boulardii (FLORASTOR) 250 MG capsule Take 250 mg by mouth daily.    [provider]  sitaGLIPtin-metformin (JANUMET) 50-500 MG per tablet Take 2 tablets by mouth every evening.     [provider]  UNABLE TO FIND daily. Lacto.acidophiluis-Bif.animals 31 billion cell cap    [provider]  warfarin (COUMADIN) 5 MG tablet Take 5 mg by mouth as directed. Tuesday and Thursday    [provider]  warfarin (COUMADIN) 6 MG tablet Take 6 mg by mouth as directed.     [provider]    Allergies Gentamicin and Morphine and related  Family History  Problem Relation Age of Onset   Breast cancer Sister     Social History Social History    Tobacco Use   Smoking status: Former Smoker   Smokeless tobacco: Never Used  Substance Use Topics   Alcohol use: No   Drug use: No    Review of Systems Constitutional: No fever/chills Eyes: No visual changes. ENT: No sore throat. Cardiovascular: Denies chest pain. Respiratory: Denies shortness of breath. Gastrointestinal: No abdominal pain.   Genitourinary: Negative for dysuria. Musculoskeletal: No injuries to the back arms or legs Skin: Negative for rash.  Bruising over her face Neurological: Negative for headaches, areas of focal weakness or numbness.    ____________________________________________   PHYSICAL EXAM:  VITAL SIGNS: ED Triage Vitals  Enc Vitals Group     BP 06/15/20 1529 128/72     Pulse Rate 06/15/20 1529 66     Resp 06/15/20 1529 16     Temp 06/15/20 1529 98.7 F (37.1 C)     Temp Source 06/15/20 1529 Oral     SpO2 06/15/20 1529 95 %     Weight 06/15/20 1530 195 lb (88.5 kg)     Height 06/15/20 1530 5\' 7"  (1.702 m)     Head Circumference --      Peak Flow --      Pain Score 06/15/20 1530 0     Pain Loc --      Pain Edu? --      Excl. in [place]? --     Constitutional: Alert and oriented. Well appearing and in no acute distress.  She is very pleasant.  Well-dressed and very well mannered. Eyes: Conjunctivae are normal. Head: Atraumatic except for a small hematoma overlying the mid forehead, also she has ecchymoses over both sides of the face over maxillary regions bilateral. Nose: No congestion/rhinnorhea.  No fluid drainage from ears bilateral.  No septal hematoma of the nose. Mouth/Throat: Mucous membranes are moist. Neck: No stridor.  No cervical thoracic or lumbar tenderness.  Examined her back no bruising or injury denoted. Cardiovascular: Normal rate, regular rhythm. Grossly normal heart sounds.  Good peripheral circulation. Respiratory: Normal respiratory effort.  No retractions. Lungs CTAB. Gastrointestinal: Soft and nontender. No  distention. Musculoskeletal: No lower extremity tenderness nor edema. Neurologic:  Normal speech and language. No gross focal neurologic deficits are appreciated.  Skin:  Skin is warm, dry and intact. No rash noted. Psychiatric: Mood and affect are normal. Speech and behavior are normal.  ____________________________________________   LABS (all labs ordered are listed, but only abnormal results are displayed)  Labs Reviewed  BASIC METABOLIC PANEL - Abnormal; Notable for the following components:      Result Value   Potassium 3.4 (*)    BUN 30 (*)    Creatinine, Ser  1.41 (*)    GFR calc non Af Amer 35 (*)    GFR calc Af Amer 40 (*)    All other components within normal limits  PROTIME-INR - Abnormal; Notable for the following components:   Prothrombin Time 25.6 (*)    INR 2.4 (*)    All other components within normal limits  CBC  CK  ___________________________________  RADIOLOGY  CT Head Wo Contrast  Result Date: 06/15/2020 CLINICAL DATA:  Pt states she got up and fell in the bathroom Friday morning- pt states she hit her face and got a bruise in the middle of her forehead- yesterday the bruising spread to around her eyes and today she noticed new bruising around her mouth. EXAM: CT HEAD WITHOUT CONTRAST CT MAXILLOFACIAL WITHOUT CONTRAST CT CERVICAL SPINE WITHOUT CONTRAST TECHNIQUE: Multidetector CT imaging of the head, cervical spine, and maxillofacial structures were performed using the standard protocol without intravenous contrast. Multiplanar CT image reconstructions of the cervical spine and maxillofacial structures were also generated. COMPARISON:  08/08/2019 FINDINGS: CT HEAD FINDINGS Brain: There is central and cortical atrophy. Periventricular white matter changes are consistent with small vessel disease. There is no intra or extra-axial fluid collection or mass lesion. The basilar cisterns and ventricles have a normal appearance. There is no CT evidence for acute infarction  or hemorrhage. Vascular: No hyperdense vessel or unexpected calcification. Skull: Normal. Negative for fracture or focal lesion. Other: Frontal scalp edema/hematoma not associated with underlying fracture. CT MAXILLOFACIAL FINDINGS Osseous: No fracture or mandibular dislocation. No destructive process. Orbits: Negative. No traumatic or inflammatory finding. Sinuses: Clear. Soft tissues: Frontal scalp edema/hematoma without underlying fracture. CT CERVICAL SPINE FINDINGS Alignment: Normal. Skull base and vertebrae: No acute fracture. No primary bone lesion or focal pathologic process. Soft tissues and spinal canal: No prevertebral fluid or swelling. No visible canal hematoma. Disc levels:  Disc height loss at C5-6 and C6-7. Upper chest: Negative. Other: None IMPRESSION: 1. No evidence for acute intracranial abnormality. 2. Atrophy and small vessel disease. 3. Frontal scalp edema/hematoma without underlying fracture. 4. No evidence for acute maxillofacial fracture. 5. Mild degenerative changes in the mid cervical spine. Electronically Signed   By: Nolon Nations M.D.   On: 06/15/2020 16:35   CT Cervical Spine Wo Contrast  Result Date: 06/15/2020 CLINICAL DATA:  Pt states she got up and fell in the bathroom Friday morning- pt states she hit her face and got a bruise in the middle of her forehead- yesterday the bruising spread to around her eyes and today she noticed new bruising around her mouth. EXAM: CT HEAD WITHOUT CONTRAST CT MAXILLOFACIAL WITHOUT CONTRAST CT CERVICAL SPINE WITHOUT CONTRAST TECHNIQUE: Multidetector CT imaging of the head, cervical spine, and maxillofacial structures were performed using the standard protocol without intravenous contrast. Multiplanar CT image reconstructions of the cervical spine and maxillofacial structures were also generated. COMPARISON:  08/08/2019 FINDINGS: CT HEAD FINDINGS Brain: There is central and cortical atrophy. Periventricular white matter changes are consistent  with small vessel disease. There is no intra or extra-axial fluid collection or mass lesion. The basilar cisterns and ventricles have a normal appearance. There is no CT evidence for acute infarction or hemorrhage. Vascular: No hyperdense vessel or unexpected calcification. Skull: Normal. Negative for fracture or focal lesion. Other: Frontal scalp edema/hematoma not associated with underlying fracture. CT MAXILLOFACIAL FINDINGS Osseous: No fracture or mandibular dislocation. No destructive process. Orbits: Negative. No traumatic or inflammatory finding. Sinuses: Clear. Soft tissues: Frontal scalp edema/hematoma without underlying fracture. CT  CERVICAL SPINE FINDINGS Alignment: Normal. Skull base and vertebrae: No acute fracture. No primary bone lesion or focal pathologic process. Soft tissues and spinal canal: No prevertebral fluid or swelling. No visible canal hematoma. Disc levels:  Disc height loss at C5-6 and C6-7. Upper chest: Negative. Other: None IMPRESSION: 1. No evidence for acute intracranial abnormality. 2. Atrophy and small vessel disease. 3. Frontal scalp edema/hematoma without underlying fracture. 4. No evidence for acute maxillofacial fracture. 5. Mild degenerative changes in the mid cervical spine. Electronically Signed   By: Nolon Nations M.D.   On: 06/15/2020 16:35   CT Maxillofacial Wo Contrast  Result Date: 06/15/2020 CLINICAL DATA:  Pt states she got up and fell in the bathroom Friday morning- pt states she hit her face and got a bruise in the middle of her forehead- yesterday the bruising spread to around her eyes and today she noticed new bruising around her mouth. EXAM: CT HEAD WITHOUT CONTRAST CT MAXILLOFACIAL WITHOUT CONTRAST CT CERVICAL SPINE WITHOUT CONTRAST TECHNIQUE: Multidetector CT imaging of the head, cervical spine, and maxillofacial structures were performed using the standard protocol without intravenous contrast. Multiplanar CT image reconstructions of the cervical spine  and maxillofacial structures were also generated. COMPARISON:  08/08/2019 FINDINGS: CT HEAD FINDINGS Brain: There is central and cortical atrophy. Periventricular white matter changes are consistent with small vessel disease. There is no intra or extra-axial fluid collection or mass lesion. The basilar cisterns and ventricles have a normal appearance. There is no CT evidence for acute infarction or hemorrhage. Vascular: No hyperdense vessel or unexpected calcification. Skull: Normal. Negative for fracture or focal lesion. Other: Frontal scalp edema/hematoma not associated with underlying fracture. CT MAXILLOFACIAL FINDINGS Osseous: No fracture or mandibular dislocation. No destructive process. Orbits: Negative. No traumatic or inflammatory finding. Sinuses: Clear. Soft tissues: Frontal scalp edema/hematoma without underlying fracture. CT CERVICAL SPINE FINDINGS Alignment: Normal. Skull base and vertebrae: No acute fracture. No primary bone lesion or focal pathologic process. Soft tissues and spinal canal: No prevertebral fluid or swelling. No visible canal hematoma. Disc levels:  Disc height loss at C5-6 and C6-7. Upper chest: Negative. Other: None IMPRESSION: 1. No evidence for acute intracranial abnormality. 2. Atrophy and small vessel disease. 3. Frontal scalp edema/hematoma without underlying fracture. 4. No evidence for acute maxillofacial fracture. 5. Mild degenerative changes in the mid cervical spine. Electronically Signed   By: Nolon Nations M.D.   On: 06/15/2020 16:35    Imaging reviewed, negative for acute intracranial injuries.  No acute fractures. ____________________________________________   PROCEDURES  Procedure(s) performed: None  Procedures  Critical Care performed: No  ____________________________________________   INITIAL IMPRESSION / ASSESSMENT AND PLAN / ED COURSE  Pertinent labs & imaging results that were available during my care of the patient were reviewed by me and  considered in my medical decision making (see chart for details).   Patient presents for evaluation of a fall occurring Friday, discussing with her it seems likely mechanical in nature.  No associated systemic symptoms or recent illness.  She has been doing well since that time, but noticed quite a bit of bruising over her face and given she is on Coumadin imaging studies seem certainly indicated.  Clinical Course as of Jun 15 2132  Nancy Fetter Jun 15, 2020  2055 Discussed results with the patient including concerns that her kidneys are functioning as well as normal, offered to provide additional care including fluids and hydration, but patient reports that she feels well and would like to be able  to go home.  She does not wish to stay.  Rather she will work to increase her water intake slightly daily, and will follow up with her primary care doctor to have her labs rechecked.  I think this is reasonable.  She is fully awake and alert able to stand without difficulty, denies complaint at this time.  She came to be evaluated for the bruising that she saw on her face and this does not show evidence of acute fracture or injury.  Discussed also with her husband with the patient's permission, and he is coming to pick her up and they will follow up closely with Dr. This week.   [MQ]  2056 Return precautions and treatment recommendations and follow-up discussed with the patient who is agreeable with the plan.    [MQ]    Clinical Course User Index [MQ] Delman Kitten, MD   Lab testing reassuring.  ____________________________________________   FINAL CLINICAL IMPRESSION(S) / ED DIAGNOSES  Final diagnoses:  Minor head injury, initial encounter  AKI (acute kidney injury) ([place])  Dehydration, mild        Note:  This document was prepared using Dragon voice recognition software and may include unintentional dictation errors       Delman Kitten, MD 06/15/20 2136

## 2020-06-15 NOTE — ED Triage Notes (Signed)
Pt states she got up and fell in the bathroom Friday morning- pt states she hit her face and got a bruise in the middle of her forehead- yesterday the bruising spread to around her eyes and today she noticed new bruising around her mouth- pt denies pain to the face- pt does take coumadin

## 2020-06-15 NOTE — ED Notes (Signed)
Reviewed discharge instructions, follow-up care, and prescriptions with patient. Patient verbalized understanding of all information reviewed. Patient stable, with no distress noted at this time.    

## 2020-06-15 NOTE — ED Provider Notes (Signed)
MSE was initiated and I personally evaluated the patient and placed orders (if any) at  3:35 PM on June 15, 2020.  The patient appears stable so that the remainder of the MSE may be completed by another provider.   Victorino Dike, FNP 06/15/20 1535    Harvest Dark, MD 06/16/20 2303

## 2020-06-25 ENCOUNTER — Other Ambulatory Visit: Payer: Self-pay | Admitting: Neurology

## 2020-06-25 DIAGNOSIS — G20 Parkinson's disease: Secondary | ICD-10-CM

## 2020-06-26 ENCOUNTER — Other Ambulatory Visit (HOSPITAL_COMMUNITY): Payer: Self-pay | Admitting: Neurology

## 2020-06-26 DIAGNOSIS — G20 Parkinson's disease: Secondary | ICD-10-CM

## 2020-07-04 DIAGNOSIS — G20 Parkinson's disease: Secondary | ICD-10-CM | POA: Insufficient documentation

## 2020-07-10 ENCOUNTER — Ambulatory Visit (HOSPITAL_COMMUNITY)
Admission: RE | Admit: 2020-07-10 | Discharge: 2020-07-10 | Disposition: A | Discharge status: Home / Self Care | Payer: Medicare Other | Source: Ambulatory Visit | Attending: Student | Admitting: Student

## 2020-07-10 ENCOUNTER — Other Ambulatory Visit: Payer: Self-pay

## 2020-07-10 ENCOUNTER — Ambulatory Visit (HOSPITAL_COMMUNITY)
Admission: RE | Admit: 2020-07-10 | Discharge: 2020-07-10 | Disposition: A | Discharge status: Home / Self Care | Payer: Medicare Other | Source: Ambulatory Visit | Attending: Neurology | Admitting: Neurology

## 2020-07-10 DIAGNOSIS — G20 Parkinson's disease: Secondary | ICD-10-CM | POA: Insufficient documentation

## 2020-07-10 DIAGNOSIS — Z950 Presence of cardiac pacemaker: Secondary | ICD-10-CM

## 2020-07-10 NOTE — Progress Notes (Signed)
psot- MRI transmission sent to [facility] Rep. Original settings restored. Patient ambulatory at discharge,

## 2020-07-10 NOTE — Progress Notes (Signed)
Informed of MRI for today.   Device system confirmed to be MRI conditional, with implant date > 6 weeks ago and no evidence of abandoned or epicardial leads in review of most recent CXR (leadless) Interrogation from today reviewed, pt is currently VP at ~70 bpm Change device settings for MRI to VOO at 90 bpm     Program device back to pre-MRI settings after completion of exam.  Shirley Friar, PA-C  07/10/2020 1:07 PM

## 2020-07-10 NOTE — Progress Notes (Signed)
[facility] sent. Orders received for VOO rate of 90. Will re-program once scan is done

## 2020-07-21 DIAGNOSIS — R531 Weakness: Secondary | ICD-10-CM

## 2020-07-21 DIAGNOSIS — N184 Chronic kidney disease, stage 4 (severe): Secondary | ICD-10-CM

## 2020-07-21 DIAGNOSIS — D530 Protein deficiency anemia: Secondary | ICD-10-CM

## 2020-07-21 DIAGNOSIS — E1121 Type 2 diabetes mellitus with diabetic nephropathy: Secondary | ICD-10-CM

## 2020-07-21 DIAGNOSIS — F39 Unspecified mood [affective] disorder: Secondary | ICD-10-CM

## 2020-07-21 DIAGNOSIS — I10 Essential (primary) hypertension: Secondary | ICD-10-CM

## 2020-07-21 DIAGNOSIS — D519 Vitamin B12 deficiency anemia, unspecified: Secondary | ICD-10-CM

## 2020-08-18 ENCOUNTER — Other Ambulatory Visit: Payer: Self-pay | Admitting: Family Medicine

## 2020-08-18 DIAGNOSIS — Z1231 Encounter for screening mammogram for malignant neoplasm of breast: Secondary | ICD-10-CM

## 2020-09-18 ENCOUNTER — Other Ambulatory Visit (HOSPITAL_COMMUNITY): Payer: Self-pay | Admitting: Nurse Practitioner

## 2020-09-18 ENCOUNTER — Ambulatory Visit
Admission: RE | Admit: 2020-09-18 | Discharge: 2020-09-18 | Disposition: A | Discharge status: Home / Self Care | Payer: Medicare Other | Source: Ambulatory Visit | Attending: Family Medicine | Admitting: Family Medicine

## 2020-09-18 ENCOUNTER — Other Ambulatory Visit: Payer: Self-pay | Admitting: Nurse Practitioner

## 2020-09-18 ENCOUNTER — Other Ambulatory Visit: Payer: Self-pay

## 2020-09-18 DIAGNOSIS — Z1231 Encounter for screening mammogram for malignant neoplasm of breast: Secondary | ICD-10-CM | POA: Diagnosis not present

## 2020-09-18 DIAGNOSIS — G8929 Other chronic pain: Secondary | ICD-10-CM

## 2020-09-30 ENCOUNTER — Other Ambulatory Visit: Payer: Self-pay

## 2020-09-30 ENCOUNTER — Ambulatory Visit: Payer: Medicare Other | Attending: Neurology | Admitting: Physical Therapy

## 2020-09-30 ENCOUNTER — Ambulatory Visit: Payer: Medicare Other | Admitting: Occupational Therapy

## 2020-09-30 ENCOUNTER — Encounter: Payer: Self-pay | Admitting: Physical Therapy

## 2020-09-30 DIAGNOSIS — R262 Difficulty in walking, not elsewhere classified: Secondary | ICD-10-CM | POA: Diagnosis present

## 2020-09-30 DIAGNOSIS — M6281 Muscle weakness (generalized): Secondary | ICD-10-CM | POA: Insufficient documentation

## 2020-09-30 NOTE — Therapy (Signed)
[place] MAIN [facility] SERVICES [address] [place], [place], [address] Phone: [phone]   Fax:  [phone]  Physical Therapy Evaluation  Patient Details  Name: Sierra [name] MRN: [id number] Date of Birth: 1936/09/04 Referring Provider (PT): Jennings Books   Encounter Date: 09/30/2020   PT End of Session - 09/30/20 1013    Visit Number 1    Number of Visits 17    Date for PT Re-Evaluation 11/07/20    PT Start Time 1005    PT Stop Time 1100    PT Time Calculation (min) 55 min    Equipment Utilized During Treatment Gait belt    Activity Tolerance Patient tolerated treatment well    Behavior During Therapy WFL for tasks assessed/performed           Past Medical History:  Diagnosis Date  . Anginal pain ([place])   . Anxiety   . Diabetes mellitus without complication ([place])   . Hypertension   . Hypothyroidism   . Protein C deficiency ([place])   . Protein S deficiency [facility])     Past Surgical History:  Procedure Laterality Date  . BACK SURGERY    . BREAST EXCISIONAL BIOPSY Left 2003?   benign  . COLONOSCOPY N/A 09/12/2017   Procedure: COLONOSCOPY;  Surgeon: Lollie Sails, MD;  Location: [facility] ENDOSCOPY;  Service: Endoscopy;  Laterality: N/A;  . COLONOSCOPY WITH PROPOFOL N/A 01/20/2016   Procedure: COLONOSCOPY WITH PROPOFOL;  Surgeon: Lollie Sails, MD;  Location: [facility] ENDOSCOPY;  Service: Endoscopy;  Laterality: N/A;  . FRACTURE SURGERY    . HIP ARTHROPLASTY Left 01/12/2019   Procedure: ARTHROPLASTY BIPOLAR HIP (HEMIARTHROPLASTY), LEFT;  Surgeon: Leim Fabry, MD;  Location: [facility] ORS;  Service: Orthopedics;  Laterality: Left;  . PACEMAKER LEADLESS INSERTION N/A 09/20/2019   Procedure: PACEMAKER LEADLESS INSERTION;  Surgeon: Isaias Cowman, MD;  Location: [place] CV LAB;  Service: Cardiovascular;  Laterality: N/A;  . PERIPHERAL VASCULAR CATHETERIZATION N/A 05/13/2015   Procedure: IVC Filter Removal;  Surgeon: Katha Cabal, MD;  Location: [place] CV LAB;  Service: Cardiovascular;  Laterality: N/A;  . TEMPORARY PACEMAKER Right 09/20/2019   Procedure: TEMPORARY PACEMAKER;  Surgeon: Isaias Cowman, MD;  Location: [place] CV LAB;  Service: Cardiovascular;  Laterality: Right;  . TONSILLECTOMY      There were no vitals filed for this visit.    Subjective Assessment - 09/30/20 1410    Subjective patiinet has loss of balance and has had 10 falls in the last 6 months. She lives with her husband at [employer] and wants to have better balance    Pertinent History Patient lives at [facility] and has PMHx of :Tremor bilateral hands, imbalance , Status post Micra AV 09/20/2019 for complete heart block,  Lumbar degenerative disc disease ,  Protein C and S deficiency- on warfarin, Insomnia-    Currently in Pain? No/denies              [facility] PT Assessment - 09/30/20 1042      Assessment   Medical Diagnosis parkinsons    Referring Provider (PT) shah, Hemang    Onset Date/Surgical Date 06/18/20    Hand Dominance Right      Precautions   Precautions Fall      Restrictions   Weight Bearing Restrictions No      Balance Screen   Has the patient fallen in the past 6 months Yes    How many times? 10  Has the patient had a decrease in activity level because of a fear of falling?  Yes    Is the patient reluctant to leave their home because of a fear of falling?  No      Home Ecologist residence    Living Arrangements Spouse/significant other    Available Help at Discharge Family    Type of [place] Access Level entry    [place] One level    [place] - 2 wheels    Additional Comments has a loft with 14 steps      Prior Function   Level of Independence Independent with household mobility with device    Vocation Retired    Leisure read      Cognition   Overall Cognitive Status Within Functional Limits for  tasks assessed      Standardized Balance Assessment   Standardized Balance Assessment Chief Technology Officer Test   Sit to Stand Able to stand without using hands and stabilize independently    Standing Unsupported Able to stand safely 2 minutes    Sitting with Back Unsupported but Feet Supported on Floor or Stool Able to sit safely and securely 2 minutes    Stand to Sit Sits safely with minimal use of hands    Transfers Able to transfer safely, minor use of hands    Standing Unsupported with Eyes Closed Able to stand 10 seconds safely    Standing Unsupported with Feet Together Able to place feet together independently and stand for 1 minute with supervision    From Standing, Reach Forward with Outstretched Arm Can reach forward >5 cm safely (2")    From Standing Position, Pick up Object from [place] to pick up shoe, needs supervision    From Standing Position, Turn to Look Behind Over each Shoulder Looks behind one side only/other side shows less weight shift    Turn 360 Degrees Able to turn 360 degrees safely but slowly    Standing Unsupported, Alternately Place Feet on Step/Stool Able to stand independently and complete 8 steps >20 seconds    Standing Unsupported, One Foot in [place] help to step but can hold 15 seconds    Standing on One Leg Unable to try or needs assist to prevent fall    Total Score 41            PAIN:  No reports of pain  POSTURE:WNL   PROM/AROM: PROM BUE:WFL AROM BUEWFL PROM BLE:WFL AROM BLE:WFL  STRENGTH:  Graded on a 0-5 scale Muscle Group Left Right                          Hip Flex 4/5 4/5  Hip Abd 4/5 4/5  Hip Add 3/5 3/5  Hip Ext -3/5 -3/5      Knee Flex 4/5 4/5  Knee Ext 4/5 4/5  Ankle DF 4/5 4/5  Ankle PF 3/5 3/5   SENSATION:  BUE : WNL BLE : WNL  NEUROLOGICAL SCREEN: (2+ unless otherwise noted.) N=normal  Ab=abnormal   Level Dermatome R L  C3 Anterior Neck  N N  C4 Top of Shoulder N N  C5 Lateral  Upper Arm  N N  C6 Lateral Arm/ Thumb  N N  C7 Middle Finger  N N  C8 4th & 5th Finger N N  T1 Medial Arm  N N  L2 Medial thigh/groin N N  L3 Lower thigh/med.knee N N  L4 Medial leg/lat thigh N N  L5 Lat. leg & dorsal foot N N  S1 post/lat foot/thigh/leg N N  S2 Post./med. thigh & leg N N    SOMATOSENSORY:  Any N & T in extremities or weakness: reports :         Sensation           Intact      Diminished         Absent  Light touch LEs                              COORDINATION: Finger to Nose:WFL   FUNCTIONAL MOBILITY: bed mobility is [facility]  needs UE support for transfers sit to stand    BALANCE: Berg balance 41/56   GAIT:Patient ambulates with RW and has decreased gait speed and unsteady with turns and faster speeds.   OUTCOME MEASURES: TEST Outcome Interpretation  5 times sit<>stand 19.45sec >60 yo, >15 sec indicates increased risk for falls  10 meter walk test   .84              m/s <1.0 m/s indicates increased risk for falls; limited community ambulator  Timed up and Go    20.02             sec <14 sec indicates increased risk for falls  6 minute walk test    550            Feet 1000 feet is community Conservator, museum/gallery Assessment 41/56 <36/56 (100% risk for falls), 37-45 (80% risk for falls); 46-51 (>50% risk for falls); 52-55 (lower risk <25% of falls)             Objective measurements completed on examination: See above findings.               PT Education - 09/30/20 1012    Education Details plan of care    Person(s) Educated Patient    Methods Explanation    Comprehension Verbalized understanding            PT Short Term Goals - 09/30/20 1016      PT SHORT TERM GOAL #1   Title Patient will be independent in home exercise program to improve strength/mobility for better functional independence with ADLs.    Time 2    Period Weeks    Status New    Target Date 10/20/20             PT Long Term Goals - 09/30/20 1022       PT LONG TERM GOAL #1   Title Patient (< 20 years old) will complete five times sit to stand test in < 10 seconds indicating an increased LE strength and improved balance.    Baseline 09/30/20= 19.45 sec    Time 4    Period Weeks    Status New    Target Date 11/03/20      PT LONG TERM GOAL #2   Title Patient will increase Berg Balance score by > 6 points to demonstrate decreased fall risk during functional activities.    Time 4    Period Weeks    Status New    Target Date 11/03/20      PT LONG TERM GOAL #3   Title Patient will increase six minute walk test distance to >  1200 for progression to community ambulator and improve gait ability    Time 4    Period Weeks    Status New    Target Date 11/03/20      PT LONG TERM GOAL #4   Title Patient will increase 10 meter walk test to >1.24m/s as to improve gait speed for better community ambulation and to reduce fall risk.    Baseline 09/30/20=.84    Time 4    Period Weeks    Status New    Target Date 11/03/20      PT LONG TERM GOAL #5   Title Patient will reduce timed up and go to <11 seconds to reduce fall risk and demonstrate improved transfer/gait ability.    Baseline 20.02 sec    Time 4    Period Weeks    Status New    Target Date 11/03/20                  Plan - 09/30/20 1014    Clinical Impression Statement Patient presents with decreased gait speed, decreased balance, and decreased BLE strength. Patient's main complaint is BLE weakness and inability to participate in desired activities. Patient wants to improve  balance and ability to ambulate on surfaces safely. Patient will benefit from skilled PT in order to increase gait speed, increase BLE strength, and improve dynamic standing balance to decrease risk for falls and enable patient to participate in desired activities.   Personal Factors and Comorbidities Age    Stability/Clinical Decision Making Stable/Uncomplicated    Clinical Decision Making Moderate     Rehab Potential Good    PT Frequency 4x / week    PT Duration 4 weeks    PT Treatment/Interventions Functional mobility training;Therapeutic activities;Neuromuscular re-education;Balance training;Therapeutic exercise;Gait training;Patient/family education;Manual techniques    PT Next Visit Plan LSVT BIG    PT Home Exercise Plan LSVT BIG next session    Consulted and Agree with Plan of Care Patient           Patient will benefit from skilled therapeutic intervention in order to improve the following deficits and impairments:  Abnormal gait, Decreased activity tolerance, Decreased endurance, Decreased strength, Decreased mobility, Decreased balance, Decreased coordination, Difficulty walking  Visit Diagnosis: Muscle weakness (generalized)  Difficulty in walking, not elsewhere classified     Problem List Patient Active Problem List   Diagnosis Date Noted  . Chronic venous insufficiency 02/09/2020  . Lymphedema 02/09/2020  . Cardiac pacemaker 10/01/2019  . Syncope 09/21/2019  . Diabetes mellitus, controlled ([place]) 09/21/2019  . Complete heart block ([place]) 09/18/2019  . Sepsis ([place]) 08/08/2019  . UTI (urinary tract infection) 08/08/2019  . Hypothyroidism 08/08/2019  . HTN (hypertension) 08/08/2019  . Diabetes ([place]) 08/08/2019  . Protein C deficiency ([place]) 08/08/2019  . Protein S deficiency ([place]) 08/08/2019  . Left leg pain 08/06/2019  . Hip fracture ([place]) 01/11/2019  . Lumbar radiculopathy 09/04/2018  . Abnormal EKG 08/14/2018  . Pre-op evaluation 08/14/2018  . Anticoagulant long-term use 01/24/2018  . Poorly-controlled hypertension 01/24/2018  . Osteoporosis with current pathological fracture 09/16/2017  . Post-menopausal osteoporosis 09/16/2017  . CKD (chronic kidney disease) stage 2, GFR 60-89 ml/min 07/17/2017  . Closed compression fracture of L1 vertebra ([place]) 10/22/2016  . Closed fracture of left scapula 10/22/2016  . Closed fracture of sacrum ([place]) 10/22/2016  .  Closed fracture of sternum 10/22/2016  . Multiple rib fractures 10/22/2016  . Trimalleolar fracture of ankle, closed, left, with routine healing, subsequent encounter  03/11/2015  . Deep vein thrombosis (DVT) of right lower extremity ([place]) 11/19/2014  . Irritable bowel syndrome with diarrhea 11/19/2014  . Closed fracture of phalanx of foot 05/15/2013  . Knee injury 01/11/2012  . Osteopenia 09/18/2011  . Hypercalcemia 04/12/2011  . Acute thromboembolism of deep veins of lower extremity ([place]) 02/20/2010  . Diabetic nephropathy ([place]) 02/20/2010  . Mixed hyperlipidemia 02/20/2010  . Vitamin D deficiency 02/20/2010    Alanson Puls, PT DPT 09/30/2020, 2:17 PM  [place] MAIN [facility] SERVICES [place], [place], [address] Phone: [phone]   Fax:  [phone]  Name: Sierra [name] MRN: [id number] Date of Birth: 1936-04-24

## 2020-10-03 ENCOUNTER — Ambulatory Visit (HOSPITAL_COMMUNITY)
Admission: RE | Admit: 2020-10-03 | Discharge: 2020-10-03 | Disposition: A | Discharge status: Home / Self Care | Payer: Medicare Other | Source: Ambulatory Visit | Attending: Nurse Practitioner | Admitting: Nurse Practitioner

## 2020-10-03 ENCOUNTER — Other Ambulatory Visit: Payer: Self-pay

## 2020-10-03 DIAGNOSIS — M5440 Lumbago with sciatica, unspecified side: Secondary | ICD-10-CM | POA: Diagnosis not present

## 2020-10-03 DIAGNOSIS — G8929 Other chronic pain: Secondary | ICD-10-CM | POA: Insufficient documentation

## 2020-10-06 ENCOUNTER — Ambulatory Visit: Payer: Medicare Other

## 2020-10-06 ENCOUNTER — Other Ambulatory Visit: Payer: Self-pay

## 2020-10-06 DIAGNOSIS — M6281 Muscle weakness (generalized): Secondary | ICD-10-CM | POA: Diagnosis not present

## 2020-10-06 DIAGNOSIS — R262 Difficulty in walking, not elsewhere classified: Secondary | ICD-10-CM

## 2020-10-06 NOTE — Therapy (Signed)
[place] MAIN [facility] SERVICES [address] [place], [place], [address] Phone: [phone]   Fax:  [phone]  Physical Therapy Treatment  Patient Details  Name: Sierra [name] MRN: [id number] Date of Birth: December 27, 1935 Referring Provider (PT): Jennings Books   Encounter Date: 10/06/2020   PT End of Session - 10/06/20 1805    Visit Number 2    Number of Visits 17    Date for PT Re-Evaluation 11/07/20    PT Start Time 0830    PT Stop Time 0926    PT Time Calculation (min) 56 min    Equipment Utilized During Treatment Gait belt    Activity Tolerance Patient tolerated treatment well    Behavior During Therapy [facility] for tasks assessed/performed           Past Medical History:  Diagnosis Date  . Anginal pain ([place])   . Anxiety   . Diabetes mellitus without complication ([place])   . Hypertension   . Hypothyroidism   . Protein C deficiency ([place])   . Protein S deficiency [facility])     Past Surgical History:  Procedure Laterality Date  . BACK SURGERY    . BREAST EXCISIONAL BIOPSY Left 2003?   benign  . COLONOSCOPY N/A 09/12/2017   Procedure: COLONOSCOPY;  Surgeon: Lollie Sails, MD;  Location: [facility] ENDOSCOPY;  Service: Endoscopy;  Laterality: N/A;  . COLONOSCOPY WITH PROPOFOL N/A 01/20/2016   Procedure: COLONOSCOPY WITH PROPOFOL;  Surgeon: Lollie Sails, MD;  Location: [facility] ENDOSCOPY;  Service: Endoscopy;  Laterality: N/A;  . FRACTURE SURGERY    . HIP ARTHROPLASTY Left 01/12/2019   Procedure: ARTHROPLASTY BIPOLAR HIP (HEMIARTHROPLASTY), LEFT;  Surgeon: Leim Fabry, MD;  Location: [facility] ORS;  Service: Orthopedics;  Laterality: Left;  . PACEMAKER LEADLESS INSERTION N/A 09/20/2019   Procedure: PACEMAKER LEADLESS INSERTION;  Surgeon: Isaias Cowman, MD;  Location: [place] CV LAB;  Service: Cardiovascular;  Laterality: N/A;  . PERIPHERAL VASCULAR CATHETERIZATION N/A 05/13/2015   Procedure: IVC Filter Removal;  Surgeon: Katha Cabal, MD;  Location: [place] CV LAB;  Service: Cardiovascular;  Laterality: N/A;  . TEMPORARY PACEMAKER Right 09/20/2019   Procedure: TEMPORARY PACEMAKER;  Surgeon: Isaias Cowman, MD;  Location: [place] CV LAB;  Service: Cardiovascular;  Laterality: Right;  . TONSILLECTOMY      There were no vitals filed for this visit.   Subjective Assessment - 10/06/20 1804    Subjective Patient presents with list of conflicts of appointments, is able to reschedule timing to resolve conflicts. No falls or LOB since last session.    Pertinent History Patient lives at [facility] and has PMHx of :Tremor bilateral hands, imbalance , Status post Micra AV 09/20/2019 for complete heart block,  Lumbar degenerative disc disease ,  Protein C and S deficiency- on warfarin, Insomnia-    Limitations Lifting;Standing;Walking;House hold activities;Other (comment)    How long can you sit comfortably? back pain is a limitation    How long can you stand comfortably? requires holding onto RW    How long can you walk comfortably? limited by stability and pain    Patient Stated Goals to get better balance and walk without a walker again    Currently in Pain? Yes    Pain Score 3     Pain Location Back    Pain Orientation Lower    Pain Descriptors / Indicators Aching    Pain Type Chronic pain    Pain Onset More than  a month ago    Pain Frequency Constant           Patient is going to miss 3 days due to the time.       Treatment:   Patient seen for LSVT Adapted Daily Session Maximal Daily Exercises for facilitation/coordination of movement Sustained movements are designed to rescale the amplitude of movement output for generalization to daily functional activities .Performed as follows for 1 set of 10 repetitions each multidirectional sustained movements  1) Floor to ceiling , cues to hold for 10 seconds,needs modeling for correct positions , VC to reach out further and to hand  placement 2) Side to side multidirectional Repetitive movements performed in sitting and are designed to provide retraining effort needed for sustained muscle activation in tasks , cues to lean fwd and hold position x 10 counts: requires cueing for leg extension and to return to starting position  3)  Step forward and reach lateral,one hand on chair;  cues for good knee flex, cues to move UE and LE in opposite directions , cues to rotate  arms up to pronate his forearms 4) Sideways step and reach: one hand on chair: cues to turn  head sideways, and turn head back to neutral, and to rotate arms and pronate forearms; cue for slap and stomp to return back to center  5)Step and reach backwards ,one hand on chair: cues for UE back, getting toe up and bending back knee; then raising hand and stomping to return to upright position 6)Rock and reach forward/backward; one hand on chair , cues to reach far fwd with UE's, patient not comfortable with feet apart , not rocking very far to get a good weight shift, not able to raise  heel or raise  toes much 6) 7) Rock and reach sideways, one hand on chair; cues for twist and look behind , only rotates side ways, challenged with  twist around or turn to look behind   functional component task with supervision 5 reps and simulated activities for: 1. Sit to stand x 10   needs cues to begin with UE up for correct start position 2. reaching high into a cabinet 3. Walking with/without walker 4.taking bigger steps  5.  no falls while ambulating   Daily HEP: big steps with upright posture down 2 aisles of hobby lobby  Ambulate 200 ft with RW and cues for upright posture and big steps.   Patient performed with instruction, verbal cues, tactile cues of therapist: goal: increase tissue extensibility, promote proper posture, improve mobility   Pt educated throughout session about proper posture and technique with exercises. Improved exercise technique, movement at target  joints, use of target muscles after min to mod verbal, visual, tactile cues.                         PT Education - 10/06/20 1805    Education Details LSVT exercise, packet, daily exercise    Person(s) Educated Patient    Methods Explanation;Demonstration;Tactile cues;Verbal cues;Handout    Comprehension Verbalized understanding;Returned demonstration;Verbal cues required;Tactile cues required            PT Short Term Goals - 09/30/20 1016      PT SHORT TERM GOAL #1   Title Patient will be independent in home exercise program to improve strength/mobility for better functional independence with ADLs.    Time 2    Period Weeks    Status New  Target Date 10/20/20             PT Long Term Goals - 09/30/20 1022      PT LONG TERM GOAL #1   Title Patient (< 58 years old) will complete five times sit to stand test in < 10 seconds indicating an increased LE strength and improved balance.    Baseline 09/30/20= 19.45 sec    Time 4    Period Weeks    Status New    Target Date 11/03/20      PT LONG TERM GOAL #2   Title Patient will increase Berg Balance score by > 6 points to demonstrate decreased fall risk during functional activities.    Time 4    Period Weeks    Status New    Target Date 11/03/20      PT LONG TERM GOAL #3   Title Patient will increase six minute walk test distance to >1200 for progression to community ambulator and improve gait ability    Time 4    Period Weeks    Status New    Target Date 11/03/20      PT LONG TERM GOAL #4   Title Patient will increase 10 meter walk test to >1.97m/s as to improve gait speed for better community ambulation and to reduce fall risk.    Baseline 09/30/20=.84    Time 4    Period Weeks    Status New    Target Date 11/03/20      PT LONG TERM GOAL #5   Title Patient will reduce timed up and go to <11 seconds to reduce fall risk and demonstrate improved transfer/gait ability.    Baseline 20.02 sec     Time 4    Period Weeks    Status New    Target Date 11/03/20                 Plan - 10/06/20 1813    Clinical Impression Statement Patient educated on daily modified LSVT exercises and given packet as well as weekly homework form. Re-educated on schedule for performance for optimal outcomes. Patient demonstrated good understanding of exercises with PT cueing and correction as needed. Frequent rest breaks required due to fatigue but no pain increases reported. Patient will benefit from skilled physical therapy for LSVT program to increase strength, balance, and decrease risk for falls.    Personal Factors and Comorbidities Age;Past/Current Experience    Stability/Clinical Decision Making Stable/Uncomplicated    Rehab Potential Good    PT Frequency 4x / week    PT Duration 4 weeks    PT Treatment/Interventions Functional mobility training;Therapeutic activities;Neuromuscular re-education;Balance training;Therapeutic exercise;Gait training;Patient/family education;Manual techniques    PT Next Visit Plan LSVT BIG    PT Home Exercise Plan LSVT BIG next session    Consulted and Agree with Plan of Care Patient           Patient will benefit from skilled therapeutic intervention in order to improve the following deficits and impairments:  Abnormal gait, Decreased activity tolerance, Decreased endurance, Decreased strength, Decreased mobility, Decreased balance, Decreased coordination, Difficulty walking  Visit Diagnosis: Muscle weakness (generalized)  Difficulty in walking, not elsewhere classified     Problem List Patient Active Problem List   Diagnosis Date Noted  . Chronic venous insufficiency 02/09/2020  . Lymphedema 02/09/2020  . Cardiac pacemaker 10/01/2019  . Syncope 09/21/2019  . Diabetes mellitus, controlled ([place]) 09/21/2019  . Complete heart block ([place]) 09/18/2019  . Sepsis (  [place]) 08/08/2019  . UTI (urinary tract infection) 08/08/2019  . Hypothyroidism 08/08/2019   . HTN (hypertension) 08/08/2019  . Diabetes ([place]) 08/08/2019  . Protein C deficiency ([place]) 08/08/2019  . Protein S deficiency ([place]) 08/08/2019  . Left leg pain 08/06/2019  . Hip fracture ([place]) 01/11/2019  . Lumbar radiculopathy 09/04/2018  . Abnormal EKG 08/14/2018  . Pre-op evaluation 08/14/2018  . Anticoagulant long-term use 01/24/2018  . Poorly-controlled hypertension 01/24/2018  . Osteoporosis with current pathological fracture 09/16/2017  . Post-menopausal osteoporosis 09/16/2017  . CKD (chronic kidney disease) stage 2, GFR 60-89 ml/min 07/17/2017  . Closed compression fracture of L1 vertebra ([place]) 10/22/2016  . Closed fracture of left scapula 10/22/2016  . Closed fracture of sacrum ([place]) 10/22/2016  . Closed fracture of sternum 10/22/2016  . Multiple rib fractures 10/22/2016  . Trimalleolar fracture of ankle, closed, left, with routine healing, subsequent encounter 03/11/2015  . Deep vein thrombosis (DVT) of right lower extremity ([place]) 11/19/2014  . Irritable bowel syndrome with diarrhea 11/19/2014  . Closed fracture of phalanx of foot 05/15/2013  . Knee injury 01/11/2012  . Osteopenia 09/18/2011  . Hypercalcemia 04/12/2011  . Acute thromboembolism of deep veins of lower extremity ([place]) 02/20/2010  . Diabetic nephropathy ([place]) 02/20/2010  . Mixed hyperlipidemia 02/20/2010  . Vitamin D deficiency 02/20/2010   Janna Arch, PT, DPT   10/06/2020, 6:15 PM  [place] MAIN [facility] SERVICES [address] [place], [place], [address] Phone: [phone]   Fax:  [phone]  Name: Lainie Daubert MRN: [id number] Date of Birth: 11-Jun-1936

## 2020-10-07 ENCOUNTER — Other Ambulatory Visit: Payer: Self-pay

## 2020-10-07 ENCOUNTER — Ambulatory Visit: Payer: Medicare Other

## 2020-10-07 DIAGNOSIS — R262 Difficulty in walking, not elsewhere classified: Secondary | ICD-10-CM

## 2020-10-07 DIAGNOSIS — M6281 Muscle weakness (generalized): Secondary | ICD-10-CM | POA: Diagnosis not present

## 2020-10-07 NOTE — Therapy (Signed)
[place] MAIN [facility] SERVICES [address] [place], [place], [address] Phone: [phone]   Fax:  [phone]  Physical Therapy Treatment  Patient Details  Name: Sierra [name] MRN: [id number] Date of Birth: 1936-08-11 Referring Provider (PT): Jennings Books   Encounter Date: 10/07/2020   PT End of Session - 10/07/20 1217    Visit Number 3    Number of Visits 17    Date for PT Re-Evaluation 11/07/20    PT Start Time 1101    PT Stop Time 1200    PT Time Calculation (min) 59 min    Equipment Utilized During Treatment Gait belt    Activity Tolerance Patient tolerated treatment well    Behavior During Therapy WFL for tasks assessed/performed           Past Medical History:  Diagnosis Date   Anginal pain ([place])    Anxiety    Diabetes mellitus without complication ([place])    Hypertension    Hypothyroidism    Protein C deficiency ([place])    Protein S deficiency ([place])     Past Surgical History:  Procedure Laterality Date   BACK SURGERY     BREAST EXCISIONAL BIOPSY Left 2003?   benign   COLONOSCOPY N/A 09/12/2017   Procedure: COLONOSCOPY;  Surgeon: Lollie Sails, MD;  Location: [facility] ENDOSCOPY;  Service: Endoscopy;  Laterality: N/A;   COLONOSCOPY WITH PROPOFOL N/A 01/20/2016   Procedure: COLONOSCOPY WITH PROPOFOL;  Surgeon: Lollie Sails, MD;  Location: [facility] ENDOSCOPY;  Service: Endoscopy;  Laterality: N/A;   FRACTURE SURGERY     HIP ARTHROPLASTY Left 01/12/2019   Procedure: ARTHROPLASTY BIPOLAR HIP (HEMIARTHROPLASTY), LEFT;  Surgeon: Leim Fabry, MD;  Location: [facility] ORS;  Service: Orthopedics;  Laterality: Left;   PACEMAKER LEADLESS INSERTION N/A 09/20/2019   Procedure: PACEMAKER LEADLESS INSERTION;  Surgeon: Isaias Cowman, MD;  Location: [place] CV LAB;  Service: Cardiovascular;  Laterality: N/A;   PERIPHERAL VASCULAR CATHETERIZATION N/A 05/13/2015   Procedure: IVC Filter Removal;  Surgeon: Katha Cabal, MD;  Location: [place] CV LAB;  Service: Cardiovascular;  Laterality: N/A;   TEMPORARY PACEMAKER Right 09/20/2019   Procedure: TEMPORARY PACEMAKER;  Surgeon: Isaias Cowman, MD;  Location: [place] CV LAB;  Service: Cardiovascular;  Laterality: Right;   TONSILLECTOMY      There were no vitals filed for this visit.   Subjective Assessment - 10/07/20 1215    Subjective Patient reports she had a hard time with her daily activities at home yesterday forgetting what to do. Did remember her homework at [employer].    Pertinent History Patient lives at [facility] and has PMHx of :Tremor bilateral hands, imbalance , Status post Micra AV 09/20/2019 for complete heart block,  Lumbar degenerative disc disease ,  Protein C and S deficiency- on warfarin, Insomnia-    Limitations Lifting;Standing;Walking;House hold activities;Other (comment)    How long can you sit comfortably? back pain is a limitation    How long can you stand comfortably? requires holding onto RW    How long can you walk comfortably? limited by stability and pain    Patient Stated Goals to get better balance and walk without a walker again    Currently in Pain? Yes    Pain Score 2     Pain Location Back    Pain Orientation Lower    Pain Descriptors / Indicators Aching    Pain Type Chronic pain    Pain Onset  More than a month ago    Pain Frequency Constant                Treatment:   Patient seen for LSVT Adapted Daily Session Maximal Daily Exercises for facilitation/coordination of movement Sustained movements are designed to rescale the amplitude of movement output for generalization to daily functional activities .Performed as follows for 1 set of 10 repetitions each multidirectional sustained movements  1) Floor to ceiling , cues to hold for 10 seconds,needs modeling for correct positions , VC to reach out further and to hand placement 2) Side to side multidirectional  Repetitive movements performed in sitting and are designed to provide retraining effort needed for sustained muscle activation in tasks , cues to lean fwd and hold position x 10 counts: requires cueing for leg extension and to return to starting position  3)  Step forward and reach lateral,one hand on chair;  cues for good knee flex, cues to move UE and LE in opposite directions , cues to rotate  arms up to pronate his forearms 4) Sideways step and reach: one hand on chair: cues to turn  head sideways, and turn head back to neutral, and to rotate arms and pronate forearms; cue for slap and stomp to return back to center  5)Step and reach backwards ,one hand on chair: cues for UE back, getting toe up and bending back knee; then raising hand and stomping to return to upright position 6)Rock and reach forward/backward; one hand on chair , cues to reach far fwd with UE's, patient not comfortable with feet apart , not rocking very far to get a good weight shift, not able to raise  heel or raise  toes much 6) 7) Rock and reach sideways, one hand on chair; cues for twist and look behind , only rotates side ways, challenged with  twist around or turn to look behind   functional component task with supervision 5 reps and simulated activities for: 1. Sit to stand x 10   needs cues to begin with UE up for correct start position 2. reaching high into a cabinet: reach for saebo ball transfer balls each UE, cues for posterior pelvic tilt  3. Walking with/without walker: ambulate 40 ft x 2 trials with cues for upright posture and foot clearance, L foot challenged with clearance. Seated rest break.  4.taking bigger steps : verbal cues for foot clearance  5.  no falls while ambulating    Daily HEP: left foot clearance ambulating to bathroom 3x/ today    Ambulate 200 ft with RW and cues for upright posture and big steps.    Patient performed with instruction, verbal cues, tactile cues of therapist: goal: increase  tissue extensibility, promote proper posture, improve mobility    Pt educated throughout session about proper posture and technique with exercises. Improved exercise technique, movement at target joints, use of target muscles after min to mod verbal, visual, tactile cues.                          PT Education - 10/07/20 1216    Education Details exercise technique, LSVT    Person(s) Educated Patient    Methods Explanation;Demonstration;Tactile cues;Verbal cues    Comprehension Verbalized understanding;Returned demonstration;Verbal cues required;Tactile cues required            PT Short Term Goals - 09/30/20 1016      PT SHORT TERM GOAL #1   Title Patient  will be independent in home exercise program to improve strength/mobility for better functional independence with ADLs.    Time 2    Period Weeks    Status New    Target Date 10/20/20             PT Long Term Goals - 09/30/20 1022      PT LONG TERM GOAL #1   Title Patient (< 63 years old) will complete five times sit to stand test in < 10 seconds indicating an increased LE strength and improved balance.    Baseline 09/30/20= 19.45 sec    Time 4    Period Weeks    Status New    Target Date 11/03/20      PT LONG TERM GOAL #2   Title Patient will increase Berg Balance score by > 6 points to demonstrate decreased fall risk during functional activities.    Time 4    Period Weeks    Status New    Target Date 11/03/20      PT LONG TERM GOAL #3   Title Patient will increase six minute walk test distance to >1200 for progression to community ambulator and improve gait ability    Time 4    Period Weeks    Status New    Target Date 11/03/20      PT LONG TERM GOAL #4   Title Patient will increase 10 meter walk test to >1.70m/s as to improve gait speed for better community ambulation and to reduce fall risk.    Baseline 09/30/20=.84    Time 4    Period Weeks    Status New    Target Date 11/03/20       PT LONG TERM GOAL #5   Title Patient will reduce timed up and go to <11 seconds to reduce fall risk and demonstrate improved transfer/gait ability.    Baseline 20.02 sec    Time 4    Period Weeks    Status New    Target Date 11/03/20                 Plan - 10/07/20 1218    Clinical Impression Statement Patient requires decreased cueing for daily exercises this session with decreased need for cues for foot clearance. Patient is able to ambulate without walker with maximal cueing and encouragement for short durations. She is more challenged with foot clearance of LLE more than RLE.Patient needs cueing for BIG swing and BIG amplitude.  Patient needs cueing for correct BIG arm swing. Patient improves with practice. Patient needs cuing to consistently swing her arms and also swing reciprocally. Patient needs cuing to swing UE's reciprocally with weight shift exercise and with backward stepping and correct UE positioning. Patient will benefit from skilled physical therapy for LSVT program to increase strength, balance, and decrease risk for falls.    Personal Factors and Comorbidities Age;Past/Current Experience    Stability/Clinical Decision Making Stable/Uncomplicated    Rehab Potential Good    PT Frequency 4x / week    PT Duration 4 weeks    PT Treatment/Interventions Functional mobility training;Therapeutic activities;Neuromuscular re-education;Balance training;Therapeutic exercise;Gait training;Patient/family education;Manual techniques    PT Next Visit Plan LSVT BIG    PT Home Exercise Plan LSVT BIG next session    Consulted and Agree with Plan of Care Patient           Patient will benefit from skilled therapeutic intervention in order to improve the following deficits and impairments:  Abnormal gait, Decreased activity tolerance, Decreased endurance, Decreased strength, Decreased mobility, Decreased balance, Decreased coordination, Difficulty walking  Visit Diagnosis: Muscle  weakness (generalized)  Difficulty in walking, not elsewhere classified     Problem List Patient Active Problem List   Diagnosis Date Noted   Chronic venous insufficiency 02/09/2020   Lymphedema 02/09/2020   Cardiac pacemaker 10/01/2019   Syncope 09/21/2019   Diabetes mellitus, controlled ([place]) 09/21/2019   Complete heart block ([place]) 09/18/2019   Sepsis ([place]) 08/08/2019   UTI (urinary tract infection) 08/08/2019   Hypothyroidism 08/08/2019   HTN (hypertension) 08/08/2019   Diabetes ([place]) 08/08/2019   Protein C deficiency ([place]) 08/08/2019   Protein S deficiency ([place]) 08/08/2019   Left leg pain 08/06/2019   Hip fracture ([place]) 01/11/2019   Lumbar radiculopathy 09/04/2018   Abnormal EKG 08/14/2018   Pre-op evaluation 08/14/2018   Anticoagulant long-term use 01/24/2018   Poorly-controlled hypertension 01/24/2018   Osteoporosis with current pathological fracture 09/16/2017   Post-menopausal osteoporosis 09/16/2017   CKD (chronic kidney disease) stage 2, GFR 60-89 ml/min 07/17/2017   Closed compression fracture of L1 vertebra ([place]) 10/22/2016   Closed fracture of left scapula 10/22/2016   Closed fracture of sacrum ([place]) 10/22/2016   Closed fracture of sternum 10/22/2016   Multiple rib fractures 10/22/2016   Trimalleolar fracture of ankle, closed, left, with routine healing, subsequent encounter 03/11/2015   Deep vein thrombosis (DVT) of right lower extremity ([place]) 11/19/2014   Irritable bowel syndrome with diarrhea 11/19/2014   Closed fracture of phalanx of foot 05/15/2013   Knee injury 01/11/2012   Osteopenia 09/18/2011   Hypercalcemia 04/12/2011   Acute thromboembolism of deep veins of lower extremity ([place]) 02/20/2010   Diabetic nephropathy ([place]) 02/20/2010   Mixed hyperlipidemia 02/20/2010   Vitamin D deficiency 02/20/2010   Janna Arch, PT, DPT   10/07/2020, 12:19 PM  [place] [facility] MAIN [facility]  SERVICES [address] [place], [place], [address] Phone: [phone]   Fax:  [phone]  Name: Sierra [name] MRN: [id number] Date of Birth: 10-Dec-1935

## 2020-10-08 ENCOUNTER — Ambulatory Visit: Payer: Medicare Other | Attending: Neurology | Admitting: Physical Therapy

## 2020-10-08 ENCOUNTER — Other Ambulatory Visit (HOSPITAL_COMMUNITY): Payer: Self-pay | Admitting: Family Medicine

## 2020-10-08 ENCOUNTER — Other Ambulatory Visit: Payer: Self-pay

## 2020-10-08 ENCOUNTER — Other Ambulatory Visit: Payer: Self-pay | Admitting: Family Medicine

## 2020-10-08 DIAGNOSIS — R262 Difficulty in walking, not elsewhere classified: Secondary | ICD-10-CM | POA: Diagnosis present

## 2020-10-08 DIAGNOSIS — R1900 Intra-abdominal and pelvic swelling, mass and lump, unspecified site: Secondary | ICD-10-CM

## 2020-10-08 DIAGNOSIS — M6281 Muscle weakness (generalized): Secondary | ICD-10-CM

## 2020-10-08 NOTE — Therapy (Signed)
[place] MAIN [facility] SERVICES [address] [place], [place], [address] Phone: [phone]   Fax:  [phone]  Physical Therapy Treatment  Patient Details  Name: Sierra [name] MRN: [id number] Date of Birth: 1936-09-02 Referring Provider (PT): Jennings Books   Encounter Date: 10/08/2020   PT End of Session - 10/08/20 1759    Visit Number 4    Number of Visits 17    Date for PT Re-Evaluation 11/07/20    PT Start Time 0938    PT Stop Time 1755    PT Time Calculation (min) 40 min    Equipment Utilized During Treatment Gait belt    Activity Tolerance Patient tolerated treatment well    Behavior During Therapy WFL for tasks assessed/performed           Past Medical History:  Diagnosis Date  . Anginal pain ([place])   . Anxiety   . Diabetes mellitus without complication ([place])   . Hypertension   . Hypothyroidism   . Protein C deficiency ([place])   . Protein S deficiency [facility])     Past Surgical History:  Procedure Laterality Date  . BACK SURGERY    . BREAST EXCISIONAL BIOPSY Left 2003?   benign  . COLONOSCOPY N/A 09/12/2017   Procedure: COLONOSCOPY;  Surgeon: Lollie Sails, MD;  Location: [facility] ENDOSCOPY;  Service: Endoscopy;  Laterality: N/A;  . COLONOSCOPY WITH PROPOFOL N/A 01/20/2016   Procedure: COLONOSCOPY WITH PROPOFOL;  Surgeon: Lollie Sails, MD;  Location: [facility] ENDOSCOPY;  Service: Endoscopy;  Laterality: N/A;  . FRACTURE SURGERY    . HIP ARTHROPLASTY Left 01/12/2019   Procedure: ARTHROPLASTY BIPOLAR HIP (HEMIARTHROPLASTY), LEFT;  Surgeon: Leim Fabry, MD;  Location: [facility] ORS;  Service: Orthopedics;  Laterality: Left;  . PACEMAKER LEADLESS INSERTION N/A 09/20/2019   Procedure: PACEMAKER LEADLESS INSERTION;  Surgeon: Isaias Cowman, MD;  Location: [place] CV LAB;  Service: Cardiovascular;  Laterality: N/A;  . PERIPHERAL VASCULAR CATHETERIZATION N/A 05/13/2015   Procedure: IVC Filter Removal;  Surgeon: Katha Cabal,  MD;  Location: [place] CV LAB;  Service: Cardiovascular;  Laterality: N/A;  . TEMPORARY PACEMAKER Right 09/20/2019   Procedure: TEMPORARY PACEMAKER;  Surgeon: Isaias Cowman, MD;  Location: [place] CV LAB;  Service: Cardiovascular;  Laterality: Right;  . TONSILLECTOMY      There were no vitals filed for this visit.   Subjective Assessment - 10/08/20 1757    Subjective Patient reports that she had a long day that consisted of going to the doctor's office for bloodwork. She states her R hip and lower back is achy today.    Pertinent History Patient lives at [facility] and has PMHx of :Tremor bilateral hands, imbalance , Status post Micra AV 09/20/2019 for complete heart block,  Lumbar degenerative disc disease ,  Protein C and S deficiency- on warfarin, Insomnia-    Limitations Lifting;Standing;Walking;House hold activities;Other (comment)    How long can you sit comfortably? back pain is a limitation    How long can you stand comfortably? requires holding onto RW    How long can you walk comfortably? limited by stability and pain    Patient Stated Goals to get better balance and walk without a walker again    Currently in Pain? Yes    Pain Score 2     Pain Location Hip    Pain Orientation Right    Pain Descriptors / Indicators Aching    Pain Onset More than a  month ago               Treatment:   Patient seen for LSVT Adapted Daily Session Maximal Daily Exercises for facilitation/coordination of movement Sustained movements are designed to rescale the amplitude of movement output for generalization to daily functional activities .Performed as follows for 1 set of 10 repetitions each multidirectional sustained movements  1) Floor to ceiling , cues to hold for 10 seconds,needs modeling for correct positions , VC to reach out further and to hand placement 2) Side to side multidirectional Repetitive movements performed in sitting and are designed to  provide retraining effort needed for sustained muscle activation in tasks , cues to lean fwd and hold position x 10 counts: requires cueing for leg extension and to return to starting position  3)  Step forward and reach lateral,one hand on chair;  cues for good knee flex, cues to move UE and LE in opposite directions , cues to rotate  arms up to pronate his forearms 4) Sideways step and reach: one hand on chair: cues to turn  head sideways, and turn head back to neutral, and to rotate arms and pronate forearms; cue for slap and stomp to return back to center  5)Step and reach backwards ,one hand on chair: cues for UE back, getting toe up and bending back knee; then raising hand and stomping to return to upright position 6)Rock and reach forward/backward; one hand on chair , cues to reach far fwd with UE's, patient not comfortable with feet apart , not rocking very far to get a good weight shift, not able to raise  heel or raise  toes much  7) Rock and reach sideways, one hand on chair; cues for twist and look behind , only rotates side ways, challenged with  twist around or turn to look behind   functional component task with supervision 5 reps and simulated activities for: 1. Sit to stand x 10   needs cues to begin with UE up for correct start position 2. reaching high into a cabinet: reach for saebo ball transfer balls each UE, cues for posterior pelvic tilt  3. Walking with/without walker: ambulate 40 ft x 2 trials with cues for upright posture and foot clearance, L foot challenged with clearance. Seated rest break.  4.taking bigger steps : verbal cues for foot clearance  5.  no falls while ambulating    Daily HEP: left foot clearance ambulating to bathroom 3x/ today    Ambulate 200 ft with RW and cues for upright posture and big steps.    Patient performed with instruction, verbal cues, tactile cues of therapist: goal: increase tissue extensibility, promote proper posture, improve mobility     Pt educated throughout session about proper posture and technique with exercises. Improved exercise technique, movement at target joints, use of target muscles after min to mod verbal, visual, tactile cues.                      PT Short Term Goals - 09/30/20 1016      PT SHORT TERM GOAL #1   Title Patient will be independent in home exercise program to improve strength/mobility for better functional independence with ADLs.    Time 2    Period Weeks    Status New    Target Date 10/20/20             PT Long Term Goals - 09/30/20 1022      PT  LONG TERM GOAL #1   Title Patient (< 30 years old) will complete five times sit to stand test in < 10 seconds indicating an increased LE strength and improved balance.    Baseline 09/30/20= 19.45 sec    Time 4    Period Weeks    Status New    Target Date 11/03/20      PT LONG TERM GOAL #2   Title Patient will increase Berg Balance score by > 6 points to demonstrate decreased fall risk during functional activities.    Time 4    Period Weeks    Status New    Target Date 11/03/20      PT LONG TERM GOAL #3   Title Patient will increase six minute walk test distance to >1200 for progression to community ambulator and improve gait ability    Time 4    Period Weeks    Status New    Target Date 11/03/20      PT LONG TERM GOAL #4   Title Patient will increase 10 meter walk test to >1.3m/s as to improve gait speed for better community ambulation and to reduce fall risk.    Baseline 09/30/20=.84    Time 4    Period Weeks    Status New    Target Date 11/03/20      PT LONG TERM GOAL #5   Title Patient will reduce timed up and go to <11 seconds to reduce fall risk and demonstrate improved transfer/gait ability.    Baseline 20.02 sec    Time 4    Period Weeks    Status New    Target Date 11/03/20                 Plan - 10/08/20 1800    Clinical Impression Statement Patient completed LSVT with constant  supervision for proper positioning of hands and hips when completing each exercises. Therapist provided constant verbal and visual cues for proper demonstration of each exercise. Patient requires verbal cues to increase BIG hand positioning while holding each exercise. Patient will benefit from skilled physical therapy for LSVT program to increase strength, balance, and decrease risk for falls.    Personal Factors and Comorbidities Age;Past/Current Experience    Stability/Clinical Decision Making Stable/Uncomplicated    Rehab Potential Good    PT Frequency 4x / week    PT Duration 4 weeks    PT Treatment/Interventions Functional mobility training;Therapeutic activities;Neuromuscular re-education;Balance training;Therapeutic exercise;Gait training;Patient/family education;Manual techniques    PT Next Visit Plan LSVT BIG    PT Home Exercise Plan LSVT BIG next session    Consulted and Agree with Plan of Care Patient           Patient will benefit from skilled therapeutic intervention in order to improve the following deficits and impairments:  Abnormal gait, Decreased activity tolerance, Decreased endurance, Decreased strength, Decreased mobility, Decreased balance, Decreased coordination, Difficulty walking  Visit Diagnosis: Muscle weakness (generalized)  Difficulty in walking, not elsewhere classified     Problem List Patient Active Problem List   Diagnosis Date Noted  . Chronic venous insufficiency 02/09/2020  . Lymphedema 02/09/2020  . Cardiac pacemaker 10/01/2019  . Syncope 09/21/2019  . Diabetes mellitus, controlled ([place]) 09/21/2019  . Complete heart block ([place]) 09/18/2019  . Sepsis ([place]) 08/08/2019  . UTI (urinary tract infection) 08/08/2019  . Hypothyroidism 08/08/2019  . HTN (hypertension) 08/08/2019  . Diabetes ([place]) 08/08/2019  . Protein C deficiency ([place]) 08/08/2019  . Protein S  deficiency ([place]) 08/08/2019  . Left leg pain 08/06/2019  . Hip fracture ([place]) 01/11/2019   . Lumbar radiculopathy 09/04/2018  . Abnormal EKG 08/14/2018  . Pre-op evaluation 08/14/2018  . Anticoagulant long-term use 01/24/2018  . Poorly-controlled hypertension 01/24/2018  . Osteoporosis with current pathological fracture 09/16/2017  . Post-menopausal osteoporosis 09/16/2017  . CKD (chronic kidney disease) stage 2, GFR 60-89 ml/min 07/17/2017  . Closed compression fracture of L1 vertebra ([place]) 10/22/2016  . Closed fracture of left scapula 10/22/2016  . Closed fracture of sacrum ([place]) 10/22/2016  . Closed fracture of sternum 10/22/2016  . Multiple rib fractures 10/22/2016  . Trimalleolar fracture of ankle, closed, left, with routine healing, subsequent encounter 03/11/2015  . Deep vein thrombosis (DVT) of right lower extremity ([place]) 11/19/2014  . Irritable bowel syndrome with diarrhea 11/19/2014  . Closed fracture of phalanx of foot 05/15/2013  . Knee injury 01/11/2012  . Osteopenia 09/18/2011  . Hypercalcemia 04/12/2011  . Acute thromboembolism of deep veins of lower extremity ([place]) 02/20/2010  . Diabetic nephropathy ([place]) 02/20/2010  . Mixed hyperlipidemia 02/20/2010  . Vitamin D deficiency 02/20/2010   Karl Luke PT, DPT Netta Corrigan 10/08/2020, 6:06 PM  [place] MAIN [facility] SERVICES [address] [place], [place], [address] Phone: [phone]   Fax:  [phone]  Name: Alajah Witman MRN: [id number] Date of Birth: 07-02-1936

## 2020-10-09 ENCOUNTER — Ambulatory Visit: Payer: Medicare Other

## 2020-10-13 ENCOUNTER — Ambulatory Visit: Payer: Medicare Other | Admitting: Physical Therapy

## 2020-10-13 ENCOUNTER — Other Ambulatory Visit: Payer: Self-pay

## 2020-10-13 DIAGNOSIS — M6281 Muscle weakness (generalized): Secondary | ICD-10-CM | POA: Diagnosis not present

## 2020-10-13 DIAGNOSIS — R262 Difficulty in walking, not elsewhere classified: Secondary | ICD-10-CM

## 2020-10-13 NOTE — Therapy (Signed)
[place] MAIN [facility] SERVICES [address] [place], [place], [address] Phone: [phone]   Fax:  [phone]  Physical Therapy Treatment  Patient Details  Name: Sierra [name] MRN: [id number] Date of Birth: 01-22-1936 Referring Provider (PT): Jennings Books   Encounter Date: 10/13/2020   PT End of Session - 10/13/20 1010    Visit Number 5    Number of Visits 17    Date for PT Re-Evaluation 11/07/20    PT Start Time 1005    PT Stop Time 1100    PT Time Calculation (min) 55 min    Equipment Utilized During Treatment Gait belt    Activity Tolerance Patient tolerated treatment well    Behavior During Therapy WFL for tasks assessed/performed           Past Medical History:  Diagnosis Date  . Anginal pain ([place])   . Anxiety   . Diabetes mellitus without complication ([place])   . Hypertension   . Hypothyroidism   . Protein C deficiency ([place])   . Protein S deficiency [facility])     Past Surgical History:  Procedure Laterality Date  . BACK SURGERY    . BREAST EXCISIONAL BIOPSY Left 2003?   benign  . COLONOSCOPY N/A 09/12/2017   Procedure: COLONOSCOPY;  Surgeon: Lollie Sails, MD;  Location: [facility] ENDOSCOPY;  Service: Endoscopy;  Laterality: N/A;  . COLONOSCOPY WITH PROPOFOL N/A 01/20/2016   Procedure: COLONOSCOPY WITH PROPOFOL;  Surgeon: Lollie Sails, MD;  Location: [facility] ENDOSCOPY;  Service: Endoscopy;  Laterality: N/A;  . FRACTURE SURGERY    . HIP ARTHROPLASTY Left 01/12/2019   Procedure: ARTHROPLASTY BIPOLAR HIP (HEMIARTHROPLASTY), LEFT;  Surgeon: Leim Fabry, MD;  Location: [facility] ORS;  Service: Orthopedics;  Laterality: Left;  . PACEMAKER LEADLESS INSERTION N/A 09/20/2019   Procedure: PACEMAKER LEADLESS INSERTION;  Surgeon: Isaias Cowman, MD;  Location: [place] CV LAB;  Service: Cardiovascular;  Laterality: N/A;  . PERIPHERAL VASCULAR CATHETERIZATION N/A 05/13/2015   Procedure: IVC Filter Removal;  Surgeon: Katha Cabal,  MD;  Location: [place] CV LAB;  Service: Cardiovascular;  Laterality: N/A;  . TEMPORARY PACEMAKER Right 09/20/2019   Procedure: TEMPORARY PACEMAKER;  Surgeon: Isaias Cowman, MD;  Location: [place] CV LAB;  Service: Cardiovascular;  Laterality: Right;  . TONSILLECTOMY      There were no vitals filed for this visit.   Subjective Assessment - 10/13/20 1031    Subjective Patient reports she had a fall on Thursday morning and has some bruises on her L hip and L shoulder. She has had a busy weekend with a Christmas parade. Her husband is going in for surgery tomorrow for a THA and she is nervous about that.    Pertinent History Patient lives at [facility] and has PMHx of :Tremor bilateral hands, imbalance , Status post Micra AV 09/20/2019 for complete heart block,  Lumbar degenerative disc disease ,  Protein C and S deficiency- on warfarin, Insomnia-    Limitations Lifting;Standing;Walking;House hold activities;Other (comment)    How long can you sit comfortably? back pain is a limitation    How long can you stand comfortably? requires holding onto RW    How long can you walk comfortably? limited by stability and pain    Patient Stated Goals to get better balance and walk without a walker again    Currently in Pain? No/denies    Pain Onset More than a month ago  Treatment:   Patient seen for LSVT Adapted Daily Session Maximal Daily Exercises for facilitation/coordination of movement Sustained movements are designed to rescale the amplitude of movement output for generalization to daily functional activities .Performed as follows for 1 set of 10 repetitions each multidirectional sustained movements  1) Floor to ceiling , cues to hold for 10 seconds,needs modeling for correct positions , VC to reach out further and to hand placement 2) Side to side multidirectional Repetitive movements performed in sitting and are designed to provide retraining  effort needed for sustained muscle activation in tasks , cues to lean fwd and hold position x 10 counts: requires cueing for leg extension and to return to starting position  3)  Step forward and reach lateral,one hand on chair;  cues for good knee flex, cues to move UE and LE in opposite directions , cues to rotate  arms up to pronate his forearms 4) Sideways step and reach: one hand on chair: cues to turn  head sideways, and turn head back to neutral, and to rotate arms and pronate forearms; cue for slap and stomp to return back to center  5)Step and reach backwards ,one hand on chair: cues for UE back, getting toe up and bending back knee; then raising hand and stomping to return to upright position 6)Rock and reach forward/backward; one hand on chair , cues to reach far fwd with UE's, patient not comfortable with feet apart , not rocking very far to get a good weight shift, not able to raise  heel or raise  toes much 6) 7) Rock and reach sideways, one hand on chair; cues for twist and look behind , only rotates side ways, challenged with  twist around or turn to look behind   functional component task with supervision 5 reps and simulated activities for: 1. Sit to stand x 10   needs cues to begin with UE up for correct start position 2. reaching high into a cabinet: reach for saebo ball transfer balls each UE, cues for posterior pelvic tilt  3. Walking with/without walker: ambulate 40 ft x 2 trials with cues for upright posture and foot clearance, L foot challenged with clearance. Seated rest break.  4.taking bigger steps : verbal cues for foot clearance with step overs on cones 5.  no falls while ambulating    Daily HEP: left foot clearance ambulating to bathroom 3x/ today    Ambulate 200 ft with RW and cues for upright posture and big steps.     Patient continues to require constant verbal and visual cues to complete LSVT BIG exercises for proper positioning of BLE and BUE. Therapist  consistently provided verbal cues to increase LLE hip flexion and knee flexion during swing phase with poor follow through during step overs on cones. Patient demonstrates progression with sit to stands without using BUE requiring longer time to complete task. Patient demonstrated increased muscle fatigue with activity as evidenced by repeated seated therapeutic rest breaks. Patient will benefit from skilled physical therapy for LSVT program to increase strength, balance, and decrease risk for falls.                PT Short Term Goals - 09/30/20 1016      PT SHORT TERM GOAL #1   Title Patient will be independent in home exercise program to improve strength/mobility for better functional independence with ADLs.    Time 2    Period Weeks    Status New    Target  Date 10/20/20             PT Long Term Goals - 09/30/20 1022      PT LONG TERM GOAL #1   Title Patient (< 1 years old) will complete five times sit to stand test in < 10 seconds indicating an increased LE strength and improved balance.    Baseline 09/30/20= 19.45 sec    Time 4    Period Weeks    Status New    Target Date 11/03/20      PT LONG TERM GOAL #2   Title Patient will increase Berg Balance score by > 6 points to demonstrate decreased fall risk during functional activities.    Time 4    Period Weeks    Status New    Target Date 11/03/20      PT LONG TERM GOAL #3   Title Patient will increase six minute walk test distance to >1200 for progression to community ambulator and improve gait ability    Time 4    Period Weeks    Status New    Target Date 11/03/20      PT LONG TERM GOAL #4   Title Patient will increase 10 meter walk test to >1.39m/s as to improve gait speed for better community ambulation and to reduce fall risk.    Baseline 09/30/20=.84    Time 4    Period Weeks    Status New    Target Date 11/03/20      PT LONG TERM GOAL #5   Title Patient will reduce timed up and go to <11 seconds to  reduce fall risk and demonstrate improved transfer/gait ability.    Baseline 20.02 sec    Time 4    Period Weeks    Status New    Target Date 11/03/20                  Patient will benefit from skilled therapeutic intervention in order to improve the following deficits and impairments:     Visit Diagnosis: Muscle weakness (generalized)  Difficulty in walking, not elsewhere classified     Problem List Patient Active Problem List   Diagnosis Date Noted  . Chronic venous insufficiency 02/09/2020  . Lymphedema 02/09/2020  . Cardiac pacemaker 10/01/2019  . Syncope 09/21/2019  . Diabetes mellitus, controlled ([place]) 09/21/2019  . Complete heart block ([place]) 09/18/2019  . Sepsis ([place]) 08/08/2019  . UTI (urinary tract infection) 08/08/2019  . Hypothyroidism 08/08/2019  . HTN (hypertension) 08/08/2019  . Diabetes ([place]) 08/08/2019  . Protein C deficiency ([place]) 08/08/2019  . Protein S deficiency () 08/08/2019  . Left leg pain 08/06/2019  . Hip fracture ([place]) 01/11/2019  . Lumbar radiculopathy 09/04/2018  . Abnormal EKG 08/14/2018  . Pre-op evaluation 08/14/2018  . Anticoagulant long-term use 01/24/2018  . Poorly-controlled hypertension 01/24/2018  . Osteoporosis with current pathological fracture 09/16/2017  . Post-menopausal osteoporosis 09/16/2017  . CKD (chronic kidney disease) stage 2, GFR 60-89 ml/min 07/17/2017  . Closed compression fracture of L1 vertebra ([place]) 10/22/2016  . Closed fracture of left scapula 10/22/2016  . Closed fracture of sacrum ([place]) 10/22/2016  . Closed fracture of sternum 10/22/2016  . Multiple rib fractures 10/22/2016  . Trimalleolar fracture of ankle, closed, left, with routine healing, subsequent encounter 03/11/2015  . Deep vein thrombosis (DVT) of right lower extremity ([place]) 11/19/2014  . Irritable bowel syndrome with diarrhea 11/19/2014  . Closed fracture of phalanx of foot 05/15/2013  . Knee injury  01/11/2012  . Osteopenia  09/18/2011  . Hypercalcemia 04/12/2011  . Acute thromboembolism of deep veins of lower extremity ([place]) 02/20/2010  . Diabetic nephropathy ([place]) 02/20/2010  . Mixed hyperlipidemia 02/20/2010  . Vitamin D deficiency 02/20/2010   Karl Luke PT, DPT Netta Corrigan 10/13/2020, 10:43 AM  [place] MAIN [facility] SERVICES [address] [place], [place], [address] Phone: [phone]   Fax:  [phone]  Name: Jaylise Peek MRN: [id number] Date of Birth: [birth year]

## 2020-10-14 ENCOUNTER — Ambulatory Visit: Payer: Medicare Other | Admitting: Physical Therapy

## 2020-10-14 ENCOUNTER — Other Ambulatory Visit: Payer: Self-pay

## 2020-10-14 DIAGNOSIS — M6281 Muscle weakness (generalized): Secondary | ICD-10-CM

## 2020-10-14 DIAGNOSIS — R262 Difficulty in walking, not elsewhere classified: Secondary | ICD-10-CM

## 2020-10-14 NOTE — Therapy (Signed)
[place] MAIN [facility] SERVICES [address] [place], [place], [address] Phone: [phone]   Fax:  [phone]  Physical Therapy Treatment  Patient Details  Name: Sierra [name] MRN: [id number] Date of Birth: March 02, 1936 Referring Provider (PT): Jennings Books   Encounter Date: 10/14/2020   PT End of Session - 10/14/20 1029    Visit Number 6    Number of Visits 17    Date for PT Re-Evaluation 11/07/20    PT Start Time 1000    PT Stop Time 1100    PT Time Calculation (min) 60 min    Equipment Utilized During Treatment Gait belt    Activity Tolerance Patient tolerated treatment well    Behavior During Therapy WFL for tasks assessed/performed           Past Medical History:  Diagnosis Date  . Anginal pain ([place])   . Anxiety   . Diabetes mellitus without complication ([place])   . Hypertension   . Hypothyroidism   . Protein C deficiency ([place])   . Protein S deficiency [facility])     Past Surgical History:  Procedure Laterality Date  . BACK SURGERY    . BREAST EXCISIONAL BIOPSY Left 2003?   benign  . COLONOSCOPY N/A 09/12/2017   Procedure: COLONOSCOPY;  Surgeon: Lollie Sails, MD;  Location: [facility] ENDOSCOPY;  Service: Endoscopy;  Laterality: N/A;  . COLONOSCOPY WITH PROPOFOL N/A 01/20/2016   Procedure: COLONOSCOPY WITH PROPOFOL;  Surgeon: Lollie Sails, MD;  Location: [facility] ENDOSCOPY;  Service: Endoscopy;  Laterality: N/A;  . FRACTURE SURGERY    . HIP ARTHROPLASTY Left 01/12/2019   Procedure: ARTHROPLASTY BIPOLAR HIP (HEMIARTHROPLASTY), LEFT;  Surgeon: Leim Fabry, MD;  Location: [facility] ORS;  Service: Orthopedics;  Laterality: Left;  . PACEMAKER LEADLESS INSERTION N/A 09/20/2019   Procedure: PACEMAKER LEADLESS INSERTION;  Surgeon: Isaias Cowman, MD;  Location: [place] CV LAB;  Service: Cardiovascular;  Laterality: N/A;  . PERIPHERAL VASCULAR CATHETERIZATION N/A 05/13/2015   Procedure: IVC Filter Removal;  Surgeon: Katha Cabal,  MD;  Location: [place] CV LAB;  Service: Cardiovascular;  Laterality: N/A;  . TEMPORARY PACEMAKER Right 09/20/2019   Procedure: TEMPORARY PACEMAKER;  Surgeon: Isaias Cowman, MD;  Location: [place] CV LAB;  Service: Cardiovascular;  Laterality: Right;  . TONSILLECTOMY      There were no vitals filed for this visit.   Subjective Assessment - 10/14/20 1027    Subjective Patient states she is very stiff today. She is unaware of whether it's from the fall she had last week or arthritis.    Pertinent History Patient lives at [facility] and has PMHx of :Tremor bilateral hands, imbalance , Status post Micra AV 09/20/2019 for complete heart block,  Lumbar degenerative disc disease ,  Protein C and S deficiency- on warfarin, Insomnia-    Limitations Lifting;Standing;Walking;House hold activities;Other (comment)    How long can you sit comfortably? back pain is a limitation    How long can you stand comfortably? requires holding onto RW    How long can you walk comfortably? limited by stability and pain    Patient Stated Goals to get better balance and walk without a walker again    Currently in Pain? No/denies    Pain Descriptors / Indicators Aching    Pain Onset More than a month ago           Treatment:   Patient seen for LSVT Adapted Daily Session Maximal Daily Exercises for  facilitation/coordination of movement Sustained movements are designed to rescale the amplitude of movement output for generalization to daily functional activities .Performed as follows for 1 set of 10 repetitions each multidirectional sustained movements  1) Floor to ceiling , cues to hold for 10 seconds,needs modeling for correct positions , VC to reach out further and to hand placement 2) Side to side multidirectional Repetitive movements performed in sitting and are designed to provide retraining effort needed for sustained muscle activation in tasks , cues to lean fwd and hold  position x 10 counts: requires cueing for leg extension and to return to starting position  3)  Step forward and reach lateral,one hand on chair;  cues for good knee flex, cues to move UE and LE in opposite directions , cues to rotate  arms up to pronate his forearms 4) Sideways step and reach: one hand on chair: cues to turn  head sideways, and turn head back to neutral, and to rotate arms and pronate forearms; cue for slap and stomp to return back to center  5)Step and reach backwards ,one hand on chair: cues for UE back, getting toe up and bending back knee; then raising hand and stomping to return to upright position 6)Rock and reach forward/backward; one hand on chair , cues to reach far fwd with UE's, patient not comfortable with feet apart , not rocking very far to get a good weight shift, not able to raise  heel or raise  toes much 6) 7) Rock and reach sideways, one hand on chair; cues for twist and look behind , only rotates side ways, challenged with  twist around or turn to look behind   functional component task with supervision 5 reps and simulated activities for: 1. Sit to stand x 10   needs cues to begin with UE up for correct start position 2. reaching high into a cabinet: reach for saebo ball transfer balls each UE, cues for posterior pelvic tilt  3. Walking with/without walker: ambulate 40 ft x 2 trials with cues for upright posture and foot clearance, L foot challenged with clearance. Seated rest break.  4.taking bigger steps : verbal cues for foot clearance with step overs on cones 5.  no falls while ambulating    Daily HEP: left foot clearance ambulating to bathroom 3x/ today    Ambulate 200 ft with RW and cues for upright posture and big steps.           PT Short Term Goals - 09/30/20 1016      PT SHORT TERM GOAL #1   Title Patient will be independent in home exercise program to improve strength/mobility for better functional independence with ADLs.    Time 2     Period Weeks    Status New    Target Date 10/20/20             PT Long Term Goals - 09/30/20 1022      PT LONG TERM GOAL #1   Title Patient (< 38 years old) will complete five times sit to stand test in < 10 seconds indicating an increased LE strength and improved balance.    Baseline 09/30/20= 19.45 sec    Time 4    Period Weeks    Status New    Target Date 11/03/20      PT LONG TERM GOAL #2   Title Patient will increase Berg Balance score by > 6 points to demonstrate decreased fall risk during functional activities.  Time 4    Period Weeks    Status New    Target Date 11/03/20      PT LONG TERM GOAL #3   Title Patient will increase six minute walk test distance to >1200 for progression to community ambulator and improve gait ability    Time 4    Period Weeks    Status New    Target Date 11/03/20      PT LONG TERM GOAL #4   Title Patient will increase 10 meter walk test to >1.65m/s as to improve gait speed for better community ambulation and to reduce fall risk.    Baseline 09/30/20=.84    Time 4    Period Weeks    Status New    Target Date 11/03/20      PT LONG TERM GOAL #5   Title Patient will reduce timed up and go to <11 seconds to reduce fall risk and demonstrate improved transfer/gait ability.    Baseline 20.02 sec    Time 4    Period Weeks    Status New    Target Date 11/03/20                 Plan - 10/14/20 1249    Clinical Impression Statement Patient completed LSVT BIG training requiring less verbal and visual cues from last therapy session. She continues to have difficulty with proper positioning of BUE when completing standing exercises. Therapist provided constant verbal cues to increased arm swing with ambulation with fair demonstration. Patient has tendency to lose balance without AD towards the L due to LLE sided weakness. Patient would benefit from skilled physical therapy services to improve BIG movements and decrease risk of falls.     Personal Factors and Comorbidities Age;Past/Current Experience    Stability/Clinical Decision Making Stable/Uncomplicated    Rehab Potential Good    PT Frequency 4x / week    PT Duration 4 weeks    PT Treatment/Interventions Functional mobility training;Therapeutic activities;Neuromuscular re-education;Balance training;Therapeutic exercise;Gait training;Patient/family education;Manual techniques    PT Next Visit Plan LSVT BIG    PT Home Exercise Plan LSVT BIG next session    Consulted and Agree with Plan of Care Patient           Patient will benefit from skilled therapeutic intervention in order to improve the following deficits and impairments:  Abnormal gait, Decreased activity tolerance, Decreased endurance, Decreased strength, Decreased mobility, Decreased balance, Decreased coordination, Difficulty walking  Visit Diagnosis: Muscle weakness (generalized)  Difficulty in walking, not elsewhere classified     Problem List Patient Active Problem List   Diagnosis Date Noted  . Chronic venous insufficiency 02/09/2020  . Lymphedema 02/09/2020  . Cardiac pacemaker 10/01/2019  . Syncope 09/21/2019  . Diabetes mellitus, controlled ([place]) 09/21/2019  . Complete heart block ([place]) 09/18/2019  . Sepsis ([place]) 08/08/2019  . UTI (urinary tract infection) 08/08/2019  . Hypothyroidism 08/08/2019  . HTN (hypertension) 08/08/2019  . Diabetes ([place]) 08/08/2019  . Protein C deficiency ([place]) 08/08/2019  . Protein S deficiency ([place]) 08/08/2019  . Left leg pain 08/06/2019  . Hip fracture ([place]) 01/11/2019  . Lumbar radiculopathy 09/04/2018  . Abnormal EKG 08/14/2018  . Pre-op evaluation 08/14/2018  . Anticoagulant long-term use 01/24/2018  . Poorly-controlled hypertension 01/24/2018  . Osteoporosis with current pathological fracture 09/16/2017  . Post-menopausal osteoporosis 09/16/2017  . CKD (chronic kidney disease) stage 2, GFR 60-89 ml/min 07/17/2017  . Closed compression fracture of L1  vertebra ([place]) 10/22/2016  . Closed  fracture of left scapula 10/22/2016  . Closed fracture of sacrum ([place]) 10/22/2016  . Closed fracture of sternum 10/22/2016  . Multiple rib fractures 10/22/2016  . Trimalleolar fracture of ankle, closed, left, with routine healing, subsequent encounter 03/11/2015  . Deep vein thrombosis (DVT) of right lower extremity ([place]) 11/19/2014  . Irritable bowel syndrome with diarrhea 11/19/2014  . Closed fracture of phalanx of foot 05/15/2013  . Knee injury 01/11/2012  . Osteopenia 09/18/2011  . Hypercalcemia 04/12/2011  . Acute thromboembolism of deep veins of lower extremity ([place]) 02/20/2010  . Diabetic nephropathy ([place]) 02/20/2010  . Mixed hyperlipidemia 02/20/2010  . Vitamin D deficiency 02/20/2010   Karl Luke PT, DPT Netta Corrigan 10/14/2020, 12:53 PM  [place] MAIN [facility] SERVICES [address] [place], [place], [address] Phone: [phone]   Fax:  [phone]  Name: Sierra [name] MRN: [id number] Date of Birth: 04-Sep-1936

## 2020-10-15 ENCOUNTER — Other Ambulatory Visit: Payer: Self-pay

## 2020-10-15 ENCOUNTER — Ambulatory Visit: Payer: Medicare Other | Admitting: Physical Therapy

## 2020-10-15 DIAGNOSIS — R262 Difficulty in walking, not elsewhere classified: Secondary | ICD-10-CM

## 2020-10-15 DIAGNOSIS — M6281 Muscle weakness (generalized): Secondary | ICD-10-CM

## 2020-10-15 NOTE — Therapy (Signed)
[place] MAIN [facility] SERVICES [address] [place], [place], [address] Phone: [phone]   Fax:  [phone]  Physical Therapy Treatment  Patient Details  Name: Sierra [name] MRN: [id number] Date of Birth: 28-Apr-1936 Referring Provider (PT): shah, [place]   Encounter Date: 10/15/2020   PT End of Session - 10/15/20 1550    Visit Number 7    Number of Visits 17    Date for PT Re-Evaluation 11/07/20    PT Start Time 0325    PT Stop Time 0415    PT Time Calculation (min) 50 min    Equipment Utilized During Treatment Gait belt    Activity Tolerance Patient tolerated treatment well    Behavior During Therapy [facility] for tasks assessed/performed           Past Medical History:  Diagnosis Date  . Anginal pain ([place])   . Anxiety   . Diabetes mellitus without complication ([place])   . Hypertension   . Hypothyroidism   . Protein C deficiency ([place])   . Protein S deficiency [facility])     Past Surgical History:  Procedure Laterality Date  . BACK SURGERY    . BREAST EXCISIONAL BIOPSY Left 2003?   benign  . COLONOSCOPY N/A 09/12/2017   Procedure: COLONOSCOPY;  Surgeon: Lollie Sails, MD;  Location: [facility] ENDOSCOPY;  Service: Endoscopy;  Laterality: N/A;  . COLONOSCOPY WITH PROPOFOL N/A 01/20/2016   Procedure: COLONOSCOPY WITH PROPOFOL;  Surgeon: Lollie Sails, MD;  Location: [facility] ENDOSCOPY;  Service: Endoscopy;  Laterality: N/A;  . FRACTURE SURGERY    . HIP ARTHROPLASTY Left 01/12/2019   Procedure: ARTHROPLASTY BIPOLAR HIP (HEMIARTHROPLASTY), LEFT;  Surgeon: Leim Fabry, MD;  Location: [facility] ORS;  Service: Orthopedics;  Laterality: Left;  . PACEMAKER LEADLESS INSERTION N/A 09/20/2019   Procedure: PACEMAKER LEADLESS INSERTION;  Surgeon: Isaias Cowman, MD;  Location: [facility] CV LAB;  Service: Cardiovascular;  Laterality: N/A;  . PERIPHERAL VASCULAR CATHETERIZATION N/A 05/13/2015   Procedure: IVC Filter Removal;  Surgeon: Katha Cabal,  MD;  Location: [facility] CV LAB;  Service: Cardiovascular;  Laterality: N/A;  . TEMPORARY PACEMAKER Right 09/20/2019   Procedure: TEMPORARY PACEMAKER;  Surgeon: Isaias Cowman, MD;  Location: [facility] CV LAB;  Service: Cardiovascular;  Laterality: Right;  . TONSILLECTOMY      There were no vitals filed for this visit.   Subjective Assessment - 10/15/20 1549    Subjective Patient states she has had a long day with endocronology and checking on her husband at the hospital because he had a THA yesterday. She states after she went to the grocery store yesterday then L hip was sore and sharp shooting pain in focal area.    Pertinent History Patient lives at [facility] and has PMHx of :Tremor bilateral hands, imbalance , Status post Micra AV 09/20/2019 for complete heart block,  Lumbar degenerative disc disease ,  Protein C and S deficiency- on warfarin, Insomnia-    Limitations Lifting;Standing;Walking;House hold activities;Other (comment)    How long can you sit comfortably? back pain is a limitation    How long can you stand comfortably? requires holding onto RW    How long can you walk comfortably? limited by stability and pain    Patient Stated Goals to get better balance and walk without a walker again    Currently in Pain? No/denies    Pain Onset More than a month ago  reatment:   Patient seen for LSVT Adapted Daily Session Maximal Daily Exercises for facilitation/coordination of movement Sustained movements are designed to rescale the amplitude of movement output for generalization to daily functional activities .Performed as follows for 1 set of 10 repetitions each multidirectional sustained movements  1) Floor to ceiling , cues to hold for 10 seconds,needs modeling for correct positions , VC to reach out further and to hand placement 2) Side to side multidirectional Repetitive movements performed in sitting and are designed to provide  retraining effort needed for sustained muscle activation in tasks , cues to lean fwd and hold position x 10 counts: requires cueing for leg extension and to return to starting position  3)  Step forward and reach lateral,one hand on chair;  cues for good knee flex, cues to move UE and LE in opposite directions , cues to rotate  arms up to pronate his forearms 4) Sideways step and reach: one hand on chair: cues to turn  head sideways, and turn head back to neutral, and to rotate arms and pronate forearms; cue for slap and stomp to return back to center  5)Step and reach backwards ,one hand on chair: cues for UE back, getting toe up and bending back knee; then raising hand and stomping to return to upright position 6)Rock and reach forward/backward; one hand on chair , cues to reach far fwd with UE's, patient not comfortable with feet apart , not rocking very far to get a good weight shift, not able to raise  heel or raise  toes much 6) 7) Rock and reach sideways, one hand on chair; cues for twist and look behind , only rotates side ways, challenged with  twist around or turn to look behind   functional component task with supervision 5 reps and simulated activities for: 1. Sit to stand x 10   needs cues to begin with UE up for correct start position 2. reaching high into a cabinet: reach for saebo ball transfer balls each UE, cues for posterior pelvic tilt  3. Walking with/without walker: ambulate 40 ft x 2 trials with cues for upright posture and foot clearance, L foot challenged with clearance. Seated rest break.  4.taking bigger steps : verbal cues for foot clearance with step overs on cones 5.  no falls while ambulating    Daily HEP: left foot clearance ambulating to bathroom 3x/ today    Ambulate 200 ft with RW and cues for upright posture and big steps.                                 PT Short Term Goals - 09/30/20 1016      PT SHORT TERM GOAL #1   Title Patient  will be independent in home exercise program to improve strength/mobility for better functional independence with ADLs.    Time 2    Period Weeks    Status New    Target Date 10/20/20             PT Long Term Goals - 09/30/20 1022      PT LONG TERM GOAL #1   Title Patient (< 27 years old) will complete five times sit to stand test in < 10 seconds indicating an increased LE strength and improved balance.    Baseline 09/30/20= 19.45 sec    Time 4    Period Weeks    Status New  Target Date 11/03/20      PT LONG TERM GOAL #2   Title Patient will increase Berg Balance score by > 6 points to demonstrate decreased fall risk during functional activities.    Time 4    Period Weeks    Status New    Target Date 11/03/20      PT LONG TERM GOAL #3   Title Patient will increase six minute walk test distance to >1200 for progression to community ambulator and improve gait ability    Time 4    Period Weeks    Status New    Target Date 11/03/20      PT LONG TERM GOAL #4   Title Patient will increase 10 meter walk test to >1.91m/s as to improve gait speed for better community ambulation and to reduce fall risk.    Baseline 09/30/20=.84    Time 4    Period Weeks    Status New    Target Date 11/03/20      PT LONG TERM GOAL #5   Title Patient will reduce timed up and go to <11 seconds to reduce fall risk and demonstrate improved transfer/gait ability.    Baseline 20.02 sec    Time 4    Period Weeks    Status New    Target Date 11/03/20                  Patient will benefit from skilled therapeutic intervention in order to improve the following deficits and impairments:     Visit Diagnosis: Muscle weakness (generalized)  Difficulty in walking, not elsewhere classified     Problem List Patient Active Problem List   Diagnosis Date Noted  . Chronic venous insufficiency 02/09/2020  . Lymphedema 02/09/2020  . Cardiac pacemaker 10/01/2019  . Syncope 09/21/2019  .  Diabetes mellitus, controlled ([place]) 09/21/2019  . Complete heart block ([place]) 09/18/2019  . Sepsis ([place]) 08/08/2019  . UTI (urinary tract infection) 08/08/2019  . Hypothyroidism 08/08/2019  . HTN (hypertension) 08/08/2019  . Diabetes ([place]) 08/08/2019  . Protein C deficiency ([place]) 08/08/2019  . Protein S deficiency ([place]) 08/08/2019  . Left leg pain 08/06/2019  . Hip fracture ([place]) 01/11/2019  . Lumbar radiculopathy 09/04/2018  . Abnormal EKG 08/14/2018  . Pre-op evaluation 08/14/2018  . Anticoagulant long-term use 01/24/2018  . Poorly-controlled hypertension 01/24/2018  . Osteoporosis with current pathological fracture 09/16/2017  . Post-menopausal osteoporosis 09/16/2017  . CKD (chronic kidney disease) stage 2, GFR 60-89 ml/min 07/17/2017  . Closed compression fracture of L1 vertebra ([place]) 10/22/2016  . Closed fracture of left scapula 10/22/2016  . Closed fracture of sacrum ([place]) 10/22/2016  . Closed fracture of sternum 10/22/2016  . Multiple rib fractures 10/22/2016  . Trimalleolar fracture of ankle, closed, left, with routine healing, subsequent encounter 03/11/2015  . Deep vein thrombosis (DVT) of right lower extremity ([place]) 11/19/2014  . Irritable bowel syndrome with diarrhea 11/19/2014  . Closed fracture of phalanx of foot 05/15/2013  . Knee injury 01/11/2012  . Osteopenia 09/18/2011  . Hypercalcemia 04/12/2011  . Acute thromboembolism of deep veins of lower extremity ([place]) 02/20/2010  . Diabetic nephropathy ([place]) 02/20/2010  . Mixed hyperlipidemia 02/20/2010  . Vitamin D deficiency 02/20/2010   Karl Luke PT, DPT Netta Corrigan 10/15/2020, 3:51 PM  [place] MAIN [facility] SERVICES [address] [place], [place], [address] Phone: [phone]   Fax:  [phone]  Name: Sierra [name] MRN: [id number] Date of Birth: 03/24/1936

## 2020-10-16 ENCOUNTER — Other Ambulatory Visit: Payer: Self-pay

## 2020-10-16 ENCOUNTER — Ambulatory Visit: Payer: Medicare Other | Admitting: Physical Therapy

## 2020-10-16 DIAGNOSIS — R262 Difficulty in walking, not elsewhere classified: Secondary | ICD-10-CM

## 2020-10-16 DIAGNOSIS — M6281 Muscle weakness (generalized): Secondary | ICD-10-CM | POA: Diagnosis not present

## 2020-10-16 NOTE — Therapy (Signed)
[place] MAIN [facility] SERVICES [address] [place], [place], [address] Phone: [phone]   Fax:  [phone]  Physical Therapy Treatment  Patient Details  Name: Sierra [name] MRN: [id number] Date of Birth: Dec 10, 1935 Referring Provider (PT): shah, [place]   Encounter Date: 10/16/2020   PT End of Session - 10/16/20 1011    Visit Number 8    Number of Visits 17    Date for PT Re-Evaluation 11/07/20    PT Start Time 1000    PT Stop Time 1100    PT Time Calculation (min) 60 min    Equipment Utilized During Treatment Gait belt    Activity Tolerance Patient tolerated treatment well    Behavior During Therapy WFL for tasks assessed/performed           Past Medical History:  Diagnosis Date  . Anginal pain ([place])   . Anxiety   . Diabetes mellitus without complication ([place])   . Hypertension   . Hypothyroidism   . Protein C deficiency ([place])   . Protein S deficiency [facility])     Past Surgical History:  Procedure Laterality Date  . BACK SURGERY    . BREAST EXCISIONAL BIOPSY Left 2003?   benign  . COLONOSCOPY N/A 09/12/2017   Procedure: COLONOSCOPY;  Surgeon: Lollie Sails, MD;  Location: [facility] ENDOSCOPY;  Service: Endoscopy;  Laterality: N/A;  . COLONOSCOPY WITH PROPOFOL N/A 01/20/2016   Procedure: COLONOSCOPY WITH PROPOFOL;  Surgeon: Lollie Sails, MD;  Location: [facility] ENDOSCOPY;  Service: Endoscopy;  Laterality: N/A;  . FRACTURE SURGERY    . HIP ARTHROPLASTY Left 01/12/2019   Procedure: ARTHROPLASTY BIPOLAR HIP (HEMIARTHROPLASTY), LEFT;  Surgeon: Leim Fabry, MD;  Location: [facility] ORS;  Service: Orthopedics;  Laterality: Left;  . PACEMAKER LEADLESS INSERTION N/A 09/20/2019   Procedure: PACEMAKER LEADLESS INSERTION;  Surgeon: Isaias Cowman, MD;  Location: [place] CV LAB;  Service: Cardiovascular;  Laterality: N/A;  . PERIPHERAL VASCULAR CATHETERIZATION N/A 05/13/2015   Procedure: IVC Filter Removal;  Surgeon: Katha Cabal,  MD;  Location: [place] CV LAB;  Service: Cardiovascular;  Laterality: N/A;  . TEMPORARY PACEMAKER Right 09/20/2019   Procedure: TEMPORARY PACEMAKER;  Surgeon: Isaias Cowman, MD;  Location: [place] CV LAB;  Service: Cardiovascular;  Laterality: Right;  . TONSILLECTOMY      There were no vitals filed for this visit.   Subjective Assessment - 10/16/20 1010    Subjective Patient states she feels good after yesterday's therapy session. She took it easy and states her L hip is still sore from the fall she had last week.    Pertinent History Patient lives at [facility] and has PMHx of :Tremor bilateral hands, imbalance , Status post Micra AV 09/20/2019 for complete heart block,  Lumbar degenerative disc disease ,  Protein C and S deficiency- on warfarin, Insomnia-    Limitations Lifting;Standing;Walking;House hold activities;Other (comment)    How long can you sit comfortably? back pain is a limitation    How long can you stand comfortably? requires holding onto RW    How long can you walk comfortably? limited by stability and pain    Patient Stated Goals to get better balance and walk without a walker again    Currently in Pain? Yes    Pain Score 5     Pain Location Hip    Pain Orientation Left    Pain Onset More than a month ago  reatment:   Patient seen for LSVT Adapted Daily Session Maximal Daily Exercises for facilitation/coordination of movement Sustained movements are designed to rescale the amplitude of movement output for generalization to daily functional activities .Performed as follows for 1 set of 10 repetitions each multidirectional sustained movements  1) Floor to ceiling , cues to hold for 10 seconds,needs modeling for correct positions , VC to reach out further and to hand placement 2) Side to side multidirectional Repetitive movements performed in sitting and are designed to provide retraining effort needed for sustained muscle  activation in tasks , cues to lean fwd and hold position x 10 counts: requires cueing for leg extension and to return to starting position  3)  Step forward and reach lateral,one hand on chair;  cues for good knee flex, cues to move UE and LE in opposite directions , cues to rotate  arms up to pronate his forearms 4) Sideways step and reach: one hand on chair: cues to turn  head sideways, and turn head back to neutral, and to rotate arms and pronate forearms; cue for slap and stomp to return back to center  5)Step and reach backwards ,one hand on chair: cues for UE back, getting toe up and bending back knee; then raising hand and stomping to return to upright position 6)Rock and reach forward/backward; one hand on chair , cues to reach far fwd with UE's, patient not comfortable with feet apart , not rocking very far to get a good weight shift, not able to raise  heel or raise  toes much 7) Rock and reach sideways, one hand on chair; cues for twist and look behind , only rotates side ways, challenged with  twist around or turn to look behind   functional component task with supervision 5 reps and simulated activities for: 1. Sit to stand x 10   needs cues to begin with UE up for correct start position 2. reaching high into a cabinet: reach for saebo ball transfer balls each UE, cues for posterior pelvic tilt  3. Walking with/without walker: ambulate 40 ft x 2 trials with cues for upright posture and foot clearance, L foot challenged with clearance with emphasis on turns.  4.taking bigger steps : verbal cues for foot clearance with step overs on cones 5.  no falls while ambulating    Daily HEP: BIG stance when washing dishes to improve balance.    Ambulate 200 ft with RW and cues for upright posture and big steps around obstacles.          PT Short Term Goals - 09/30/20 1016      PT SHORT TERM GOAL #1   Title Patient will be independent in home exercise program to improve strength/mobility  for better functional independence with ADLs.    Time 2    Period Weeks    Status New    Target Date 10/20/20             PT Long Term Goals - 09/30/20 1022      PT LONG TERM GOAL #1   Title Patient (< 47 years old) will complete five times sit to stand test in < 10 seconds indicating an increased LE strength and improved balance.    Baseline 09/30/20= 19.45 sec    Time 4    Period Weeks    Status New    Target Date 11/03/20      PT LONG TERM GOAL #2   Title Patient will increase Merrilee Jansky  Balance score by > 6 points to demonstrate decreased fall risk during functional activities.    Time 4    Period Weeks    Status New    Target Date 11/03/20      PT LONG TERM GOAL #3   Title Patient will increase six minute walk test distance to >1200 for progression to community ambulator and improve gait ability    Time 4    Period Weeks    Status New    Target Date 11/03/20      PT LONG TERM GOAL #4   Title Patient will increase 10 meter walk test to >1.42m/s as to improve gait speed for better community ambulation and to reduce fall risk.    Baseline 09/30/20=.84    Time 4    Period Weeks    Status New    Target Date 11/03/20      PT LONG TERM GOAL #5   Title Patient will reduce timed up and go to <11 seconds to reduce fall risk and demonstrate improved transfer/gait ability.    Baseline 20.02 sec    Time 4    Period Weeks    Status New    Target Date 11/03/20                 Plan - 10/16/20 1126    Clinical Impression Statement Patient completes LSVT BIG program requiring minimal verbal cues for direction and sequencing of task. Therapist provided visual cues for standing exercises for proper placement of knees and UE. Patient continues to demonstrates small hands with exercises. Patient ambulates with emphasis on navigating through obstacles and reaching across therapy gym requiring CGA-minA with turns due to decreased safety awareness. Patient demonstrated muscle  fatigue and increased pain along left hip with ambulation and required a seated therapeutic rest break to recover. Therapy will continue to progress patient and improve mobility and decrease risk for falls to improve patient's quality of life.    Personal Factors and Comorbidities Age;Past/Current Experience    Stability/Clinical Decision Making Stable/Uncomplicated    Rehab Potential Good    PT Frequency 4x / week    PT Duration 4 weeks    PT Treatment/Interventions Functional mobility training;Therapeutic activities;Neuromuscular re-education;Balance training;Therapeutic exercise;Gait training;Patient/family education;Manual techniques    PT Next Visit Plan LSVT BIG    PT Home Exercise Plan LSVT BIG next session    Consulted and Agree with Plan of Care Patient           Patient will benefit from skilled therapeutic intervention in order to improve the following deficits and impairments:  Abnormal gait,Decreased activity tolerance,Decreased endurance,Decreased strength,Decreased mobility,Decreased balance,Decreased coordination,Difficulty walking  Visit Diagnosis: Muscle weakness (generalized)  Difficulty in walking, not elsewhere classified     Problem List Patient Active Problem List   Diagnosis Date Noted  . Chronic venous insufficiency 02/09/2020  . Lymphedema 02/09/2020  . Cardiac pacemaker 10/01/2019  . Syncope 09/21/2019  . Diabetes mellitus, controlled ([place]) 09/21/2019  . Complete heart block ([place]) 09/18/2019  . Sepsis ([place]) 08/08/2019  . UTI (urinary tract infection) 08/08/2019  . Hypothyroidism 08/08/2019  . HTN (hypertension) 08/08/2019  . Diabetes ([place]) 08/08/2019  . Protein C deficiency ([place]) 08/08/2019  . Protein S deficiency ([place]) 08/08/2019  . Left leg pain 08/06/2019  . Hip fracture ([place]) 01/11/2019  . Lumbar radiculopathy 09/04/2018  . Abnormal EKG 08/14/2018  . Pre-op evaluation 08/14/2018  . Anticoagulant long-term use 01/24/2018  .  Poorly-controlled hypertension 01/24/2018  . Osteoporosis with  current pathological fracture 09/16/2017  . Post-menopausal osteoporosis 09/16/2017  . CKD (chronic kidney disease) stage 2, GFR 60-89 ml/min 07/17/2017  . Closed compression fracture of L1 vertebra ([place]) 10/22/2016  . Closed fracture of left scapula 10/22/2016  . Closed fracture of sacrum ([place]) 10/22/2016  . Closed fracture of sternum 10/22/2016  . Multiple rib fractures 10/22/2016  . Trimalleolar fracture of ankle, closed, left, with routine healing, subsequent encounter 03/11/2015  . Deep vein thrombosis (DVT) of right lower extremity ([place]) 11/19/2014  . Irritable bowel syndrome with diarrhea 11/19/2014  . Closed fracture of phalanx of foot 05/15/2013  . Knee injury 01/11/2012  . Osteopenia 09/18/2011  . Hypercalcemia 04/12/2011  . Acute thromboembolism of deep veins of lower extremity ([place]) 02/20/2010  . Diabetic nephropathy ([place]) 02/20/2010  . Mixed hyperlipidemia 02/20/2010  . Vitamin D deficiency 02/20/2010   Karl Luke PT, DPT Netta Corrigan 10/16/2020, 12:00 PM  [place] MAIN [facility] SERVICES [address] [place], [place], [address] Phone: [phone]   Fax:  [phone]  Name: Zoriana Oats MRN: [id number] Date of Birth: June 24, 1936

## 2020-10-20 ENCOUNTER — Other Ambulatory Visit: Payer: Self-pay

## 2020-10-20 ENCOUNTER — Ambulatory Visit: Payer: Medicare Other | Admitting: Physical Therapy

## 2020-10-20 DIAGNOSIS — R262 Difficulty in walking, not elsewhere classified: Secondary | ICD-10-CM

## 2020-10-20 DIAGNOSIS — M6281 Muscle weakness (generalized): Secondary | ICD-10-CM

## 2020-10-20 NOTE — Therapy (Signed)
[place] MAIN [facility] SERVICES [address] [place], [place], [address] Phone: [phone]   Fax:  [phone]  Physical Therapy Treatment  Patient Details  Name: Sierra [name] MRN: [id number] Date of Birth: 1936/10/13 Referring Provider (PT): shah, [place]   Encounter Date: 10/20/2020   PT End of Session - 10/20/20 1032    Visit Number 9    Number of Visits 17    Date for PT Re-Evaluation 11/07/20    PT Start Time 1005    PT Stop Time 1100    PT Time Calculation (min) 55 min    Equipment Utilized During Treatment Gait belt    Activity Tolerance Patient tolerated treatment well    Behavior During Therapy WFL for tasks assessed/performed           Past Medical History:  Diagnosis Date   Anginal pain ([place])    Anxiety    Diabetes mellitus without complication ([place])    Hypertension    Hypothyroidism    Protein C deficiency ([place])    Protein S deficiency ([place])     Past Surgical History:  Procedure Laterality Date   BACK SURGERY     BREAST EXCISIONAL BIOPSY Left 2003?   benign   COLONOSCOPY N/A 09/12/2017   Procedure: COLONOSCOPY;  Surgeon: Lollie Sails, MD;  Location: [facility] ENDOSCOPY;  Service: Endoscopy;  Laterality: N/A;   COLONOSCOPY WITH PROPOFOL N/A 01/20/2016   Procedure: COLONOSCOPY WITH PROPOFOL;  Surgeon: Lollie Sails, MD;  Location: [facility] ENDOSCOPY;  Service: Endoscopy;  Laterality: N/A;   FRACTURE SURGERY     HIP ARTHROPLASTY Left 01/12/2019   Procedure: ARTHROPLASTY BIPOLAR HIP (HEMIARTHROPLASTY), LEFT;  Surgeon: Leim Fabry, MD;  Location: [facility] ORS;  Service: Orthopedics;  Laterality: Left;   PACEMAKER LEADLESS INSERTION N/A 09/20/2019   Procedure: PACEMAKER LEADLESS INSERTION;  Surgeon: Isaias Cowman, MD;  Location: [place] CV LAB;  Service: Cardiovascular;  Laterality: N/A;   PERIPHERAL VASCULAR CATHETERIZATION N/A 05/13/2015   Procedure: IVC Filter Removal;  Surgeon: Katha Cabal, MD;  Location: [place] CV LAB;  Service: Cardiovascular;  Laterality: N/A;   TEMPORARY PACEMAKER Right 09/20/2019   Procedure: TEMPORARY PACEMAKER;  Surgeon: Isaias Cowman, MD;  Location: [place] CV LAB;  Service: Cardiovascular;  Laterality: Right;   TONSILLECTOMY      There were no vitals filed for this visit.   Subjective Assessment - 10/20/20 1036    Subjective Patient states she had a good weekend. She states her bruising on her L hip has improved. She denies of any falls. She was noncompliant with her HEP over the weekend.    Pertinent History Patient lives at [facility] and has PMHx of :Tremor bilateral hands, imbalance , Status post Micra AV 09/20/2019 for complete heart block,  Lumbar degenerative disc disease ,  Protein C and S deficiency- on warfarin, Insomnia-    Limitations Lifting;Standing;Walking;House hold activities;Other (comment)    How long can you sit comfortably? back pain is a limitation    How long can you stand comfortably? requires holding onto RW    How long can you walk comfortably? limited by stability and pain    Patient Stated Goals to get better balance and walk without a walker again    Currently in Pain? No/denies    Pain Onset More than a month ago             Treatment:   Patient seen for LSVT Adapted Daily Session Maximal  Daily Exercises for facilitation/coordination of movement Sustained movements are designed to rescale the amplitude of movement output for generalization to daily functional activities .Performed as follows for 1 set of 10 repetitions each multidirectional sustained movements  1) Floor to ceiling , cues to hold for 10 seconds,needs modeling for correct positions , VC to reach out further and to hand placement 2) Side to side multidirectional Repetitive movements performed in sitting and are designed to provide retraining effort needed for sustained muscle activation in tasks , cues to  lean fwd and hold position x 10 counts: requires cueing for leg extension and to return to starting position  3)  Step forward and reach lateral,one hand on chair;  cues for good knee flex, cues to move UE and LE in opposite directions , cues to rotate  arms up to pronate his forearms 4) Sideways step and reach: one hand on chair: cues to turn  head sideways, and turn head back to neutral, and to rotate arms and pronate forearms; cue for slap and stomp to return back to center  5)Step and reach backwards ,one hand on chair: cues for UE back, getting toe up and bending back knee; then raising hand and stomping to return to upright position 6)Rock and reach forward/backward; one hand on chair , cues to reach far fwd with UE's, patient not comfortable with feet apart , not rocking very far to get a good weight shift, not able to raise  heel or raise  toes much 7) Rock and reach sideways, one hand on chair; cues for twist and look behind , only rotates side ways, challenged with  twist around or turn to look behind    functional component task with supervision 5 reps and simulated activities for: 1. Sit to stand x 10   needs cues to begin with UE up for correct start position 2. reaching high into a cabinet: reach for saebo ball transfer balls each UE, cues for posterior pelvic tilt  3. Walking with/without walker: ambulate 40 ft x 2 trials with cues for upright posture and foot clearance, L foot challenged with clearance with emphasis on turns.  4.taking bigger steps : verbal cues for foot clearance with step overs on cones 5.  no falls while ambulating    Daily HEP: BIG turns when she goes to [employer] for grocery shopping.    Ambulate 200 ft with RW and cues for upright posture and big steps around obstacles.         PT Short Term Goals - 09/30/20 1016      PT SHORT TERM GOAL #1   Title Patient will be independent in home exercise program to improve strength/mobility for better functional  independence with ADLs.    Time 2    Period Weeks    Status New    Target Date 10/20/20             PT Long Term Goals - 09/30/20 1022      PT LONG TERM GOAL #1   Title Patient (< 30 years old) will complete five times sit to stand test in < 10 seconds indicating an increased LE strength and improved balance.    Baseline 09/30/20= 19.45 sec    Time 4    Period Weeks    Status New    Target Date 11/03/20      PT LONG TERM GOAL #2   Title Patient will increase Berg Balance score by > 6 points to demonstrate decreased  fall risk during functional activities.    Time 4    Period Weeks    Status New    Target Date 11/03/20      PT LONG TERM GOAL #3   Title Patient will increase six minute walk test distance to >1200 for progression to community ambulator and improve gait ability    Time 4    Period Weeks    Status New    Target Date 11/03/20      PT LONG TERM GOAL #4   Title Patient will increase 10 meter walk test to >1.64m/s as to improve gait speed for better community ambulation and to reduce fall risk.    Baseline 09/30/20=.84    Time 4    Period Weeks    Status New    Target Date 11/03/20      PT LONG TERM GOAL #5   Title Patient will reduce timed up and go to <11 seconds to reduce fall risk and demonstrate improved transfer/gait ability.    Baseline 20.02 sec    Time 4    Period Weeks    Status New    Target Date 11/03/20                  Patient will benefit from skilled therapeutic intervention in order to improve the following deficits and impairments:     Visit Diagnosis: Muscle weakness (generalized)  Difficulty in walking, not elsewhere classified     Problem List Patient Active Problem List   Diagnosis Date Noted   Chronic venous insufficiency 02/09/2020   Lymphedema 02/09/2020   Cardiac pacemaker 10/01/2019   Syncope 09/21/2019   Diabetes mellitus, controlled ([place]) 09/21/2019   Complete heart block ([place]) 09/18/2019    Sepsis ([place]) 08/08/2019   UTI (urinary tract infection) 08/08/2019   Hypothyroidism 08/08/2019   HTN (hypertension) 08/08/2019   Diabetes ([place]) 08/08/2019   Protein C deficiency ([place]) 08/08/2019   Protein S deficiency ([place]) 08/08/2019   Left leg pain 08/06/2019   Hip fracture ([place]) 01/11/2019   Lumbar radiculopathy 09/04/2018   Abnormal EKG 08/14/2018   Pre-op evaluation 08/14/2018   Anticoagulant long-term use 01/24/2018   Poorly-controlled hypertension 01/24/2018   Osteoporosis with current pathological fracture 09/16/2017   Post-menopausal osteoporosis 09/16/2017   CKD (chronic kidney disease) stage 2, GFR 60-89 ml/min 07/17/2017   Closed compression fracture of L1 vertebra ([place]) 10/22/2016   Closed fracture of left scapula 10/22/2016   Closed fracture of sacrum ([place]) 10/22/2016   Closed fracture of sternum 10/22/2016   Multiple rib fractures 10/22/2016   Trimalleolar fracture of ankle, closed, left, with routine healing, subsequent encounter 03/11/2015   Deep vein thrombosis (DVT) of right lower extremity ([place]) 11/19/2014   Irritable bowel syndrome with diarrhea 11/19/2014   Closed fracture of phalanx of foot 05/15/2013   Knee injury 01/11/2012   Osteopenia 09/18/2011   Hypercalcemia 04/12/2011   Acute thromboembolism of deep veins of lower extremity ([place]) 02/20/2010   Diabetic nephropathy ([place]) 02/20/2010   Mixed hyperlipidemia 02/20/2010   Vitamin D deficiency 02/20/2010    Netta Corrigan 10/20/2020, 10:46 AM  [place] MAIN [facility] SERVICES [place], [place], [address] Phone: [phone]   Fax:  [phone]  Name: Arieana Somoza MRN: [id number] Date of Birth: [birth year]

## 2020-10-21 ENCOUNTER — Ambulatory Visit: Payer: Medicare Other | Admitting: Physical Therapy

## 2020-10-21 ENCOUNTER — Other Ambulatory Visit: Payer: Self-pay

## 2020-10-21 DIAGNOSIS — R262 Difficulty in walking, not elsewhere classified: Secondary | ICD-10-CM

## 2020-10-21 DIAGNOSIS — M6281 Muscle weakness (generalized): Secondary | ICD-10-CM | POA: Diagnosis not present

## 2020-10-21 NOTE — Therapy (Signed)
[place] MAIN [facility] SERVICES [address] [place], [place], [address] Phone: [phone]   Fax:  [phone]  Physical Therapy Treatment/Progress Note  Dates of reporting period  09/30/2020   to   10/21/2020  Patient Details  Name: Sierra [name] MRN: [id number] Date of Birth: [birth year] Referring Provider (PT): shah, [place]   Encounter Date: 10/21/2020   PT End of Session - 10/21/20 1030    Visit Number 10    Number of Visits 17    Date for PT Re-Evaluation 11/07/20    PT Start Time 1000    PT Stop Time 1100    PT Time Calculation (min) 60 min    Equipment Utilized During Treatment Gait belt    Activity Tolerance Patient tolerated treatment well    Behavior During Therapy WFL for tasks assessed/performed           Past Medical History:  Diagnosis Date   Anginal pain ([place])    Anxiety    Diabetes mellitus without complication ([place])    Hypertension    Hypothyroidism    Protein C deficiency ([place])    Protein S deficiency ([place])     Past Surgical History:  Procedure Laterality Date   BACK SURGERY     BREAST EXCISIONAL BIOPSY Left 2003?   benign   COLONOSCOPY N/A 09/12/2017   Procedure: COLONOSCOPY;  Surgeon: Lollie Sails, MD;  Location: [facility] ENDOSCOPY;  Service: Endoscopy;  Laterality: N/A;   COLONOSCOPY WITH PROPOFOL N/A 01/20/2016   Procedure: COLONOSCOPY WITH PROPOFOL;  Surgeon: Lollie Sails, MD;  Location: [facility] ENDOSCOPY;  Service: Endoscopy;  Laterality: N/A;   FRACTURE SURGERY     HIP ARTHROPLASTY Left 01/12/2019   Procedure: ARTHROPLASTY BIPOLAR HIP (HEMIARTHROPLASTY), LEFT;  Surgeon: Leim Fabry, MD;  Location: [facility] ORS;  Service: Orthopedics;  Laterality: Left;   PACEMAKER LEADLESS INSERTION N/A 09/20/2019   Procedure: PACEMAKER LEADLESS INSERTION;  Surgeon: Isaias Cowman, MD;  Location: [place] CV LAB;  Service: Cardiovascular;  Laterality: N/A;   PERIPHERAL VASCULAR CATHETERIZATION  N/A 05/13/2015   Procedure: IVC Filter Removal;  Surgeon: Katha Cabal, MD;  Location: [place] CV LAB;  Service: Cardiovascular;  Laterality: N/A;   TEMPORARY PACEMAKER Right 09/20/2019   Procedure: TEMPORARY PACEMAKER;  Surgeon: Isaias Cowman, MD;  Location: [place] CV LAB;  Service: Cardiovascular;  Laterality: Right;   TONSILLECTOMY      There were no vitals filed for this visit.   Subjective Assessment - 10/21/20 1657    Subjective Patient had a ground level fall yesterday. She states she was unloading her car from her trip to [employer] and got her feet jumbled up and had a slow fall. She was able to get back up without any assistance. She denies of any pain from the fall and she did not notice bruising on her left hip.    Pertinent History Patient lives at [facility] and has PMHx of :Tremor bilateral hands, imbalance , Status post Micra AV 09/20/2019 for complete heart block,  Lumbar degenerative disc disease ,  Protein C and S deficiency- on warfarin, Insomnia-    Limitations Lifting;Standing;Walking;House hold activities;Other (comment)    How long can you sit comfortably? back pain is a limitation    How long can you stand comfortably? requires holding onto RW    How long can you walk comfortably? limited by stability and pain    Patient Stated Goals to get better balance and walk without a walker  again    Currently in Pain? No/denies    Pain Onset More than a month ago              [facility] PT Assessment - 10/21/20 0001      Assessment   Medical Diagnosis parkinsons    Referring Provider (PT) shah, [place]    Onset Date/Surgical Date 06/18/20    Hand Dominance Right      Precautions   Precautions Fall      Restrictions   Weight Bearing Restrictions No      Balance Screen   Has the patient fallen in the past 6 months Yes    How many times? 10    Has the patient had a decrease in activity level because of a fear of falling?  Yes     Is the patient reluctant to leave their home because of a fear of falling?  No      Home Ecologist residence    Living Arrangements Spouse/significant other    Available Help at Discharge Family    Type of [place] Access Level entry    [place] One level    [place] - 2 wheels    Additional Comments has a loft with 14 steps      Prior Function   Level of Independence Independent with household mobility with device    Vocation Retired      Associate Professor   Overall Cognitive Status Within Functional Limits for tasks assessed      6 Minute Walk- Baseline   6 Minute Walk- Baseline yes    BP (mmHg) 140/73    HR (bpm) 67    02 Sat (%RA) 95 %      6 Minute walk- Post Test   6 Minute Walk Post Test yes    BP (mmHg) 159/79    HR (bpm) 70    02 Sat (%RA) 97 %    Perceived Rate of Exertion ([place]) 13- Somewhat hard      6 minute walk test results    Aerobic Endurance Distance Walked 705      Berg Balance Test   Sit to Stand Able to stand without using hands and stabilize independently    Standing Unsupported Able to stand safely 2 minutes    Sitting with Back Unsupported but Feet Supported on Floor or Stool Able to sit safely and securely 2 minutes    Stand to Sit Sits safely with minimal use of hands    Transfers Able to transfer safely, minor use of hands    Standing Unsupported with Eyes Closed Able to stand 10 seconds safely    Standing Unsupported with Feet Together Able to place feet together independently and stand for 1 minute with supervision    From Standing, Reach Forward with Outstretched Arm Can reach forward >12 cm safely (5")    From Standing Position, Pick up Object from Floor Able to pick up shoe safely and easily    From Standing Position, Turn to Look Behind Over each Shoulder Looks behind one side only/other side shows less weight shift    Turn 360 Degrees Able to turn 360 degrees safely one side only in 4  seconds or less    Standing Unsupported, Alternately Place Feet on Step/Stool Able to stand independently and complete 8 steps >20 seconds    Standing Unsupported, One Foot in Front Able to take small step independently and  hold 30 seconds    Standing on One Leg Tries to lift leg/unable to hold 3 seconds but remains standing independently    Total Score 46            Treatment:   Patient seen for LSVT Adapted Daily Session Maximal Daily Exercises for facilitation/coordination of movement Sustained movements are designed to rescale the amplitude of movement output for generalization to daily functional activities .Performed as follows for 1 set of 10 repetitions each multidirectional sustained movements  1) Floor to ceiling , cues to hold for 10 seconds,needs modeling for correct positions , VC to reach out further and to hand placement 2) Side to side multidirectional Repetitive movements performed in sitting and are designed to provide retraining effort needed for sustained muscle activation in tasks , cues to lean fwd and hold position x 10 counts: requires cueing for leg extension and to return to starting position  3)  Step forward and reach lateral,one hand on chair;  cues for good knee flex, cues to move UE and LE in opposite directions , cues to rotate  arms up to pronate his forearms 4) Sideways step and reach: one hand on chair: cues to turn  head sideways, and turn head back to neutral, and to rotate arms and pronate forearms; cue for slap and stomp to return back to center  5)Step and reach backwards ,one hand on chair: cues for UE back, getting toe up and bending back knee; then raising hand and stomping to return to upright position 6)Rock and reach forward/backward; one hand on chair , cues to reach far fwd with UE's, patient not comfortable with feet apart , not rocking very far to get a good weight shift, not able to raise  heel or raise  toes much 7) Rock and reach sideways, one  hand on chair; cues for twist and look behind , only rotates side ways, challenged with  twist around or turn to look behind   Updated patient's goals and outcome measures in today's therapy session. Patient demonstrates significant improvement in five times sit to stand with a decrease of 19 seconds to 16 seconds signifying improvement in BLE general strength. Patient increased Berg Balance score from 41/56 to 46/56, placing patient at a decreased risk for falls. Patient continues to show deficits in endurance and overall mobility. Patient has not met her rehab potential at this time. Patient will continue to benefit from skilled physical therapy to improve generalized strength, ROM, and capacity for functional activity.             PT Short Term Goals - 10/21/20 1701      PT SHORT TERM GOAL #1   Title Patient will be independent in home exercise program to improve strength/mobility for better functional independence with ADLs.    Baseline 12/14: patient is not consistent with her HEP    Time 2    Period Weeks    Status On-going    Target Date 11/04/20             PT Long Term Goals - 10/21/20 1034      PT LONG TERM GOAL #1   Title Patient (< 38 years old) will complete five times sit to stand test in < 10 seconds indicating an increased LE strength and improved balance.    Baseline 09/30/20= 19.45 sec. 12/14: 16 sec    Time 4    Period Weeks    Status On-going    Target Date  11/04/20      PT LONG TERM GOAL #2   Title Patient will increase Berg Balance score by > 6 points to demonstrate decreased fall risk during functional activities.    Baseline 09/30/20: 41/56, 10/21/20: 46/56    Time 4    Period Weeks    Status Partially Met    Target Date 11/04/20      PT LONG TERM GOAL #3   Title Patient will increase six minute walk test distance to >1200 for progression to community ambulator and improve gait ability    Baseline 12/14: 705 ft    Time 4    Period Weeks     Status On-going    Target Date 11/04/20      PT LONG TERM GOAL #4   Title Patient will increase 10 meter walk test to >1.28ms as to improve gait speed for better community ambulation and to reduce fall risk.    Baseline 09/30/20=.84; 12/14: 0.88    Time 4    Period Weeks    Status New    Target Date 11/04/20      PT LONG TERM GOAL #5   Title Patient will reduce timed up and go to <11 seconds to reduce fall risk and demonstrate improved transfer/gait ability.    Baseline 11/23: 20.02 sec, 12/14: 12.7 sec    Time 4    Period Weeks    Status On-going    Target Date 11/04/20                 Plan - 10/21/20 1722    Clinical Impression Statement Updated patient's goals and outcome measures in today's therapy session. Patient demonstrates significant improvement in five times sit to stand with a decrease of 19 seconds to 16 seconds signifying improvement in BLE general strength. Patient increased Berg Balance score from 41/56 to 46/56, placing patient at a decreased risk for falls. Patient continues to show deficits in endurance and overall mobility. Patient has not met her rehab potential at this time. Patient will continue to benefit from skilled physical therapy to improve generalized strength, ROM, and capacity for functional activity.    Personal Factors and Comorbidities Age;Past/Current Experience    Stability/Clinical Decision Making Stable/Uncomplicated    Rehab Potential Good    PT Frequency 4x / week    PT Duration 4 weeks    PT Treatment/Interventions Functional mobility training;Therapeutic activities;Neuromuscular re-education;Balance training;Therapeutic exercise;Gait training;Patient/family education;Manual techniques    PT Next Visit Plan LSVT BIG    PT Home Exercise Plan LSVT BIG next session    Consulted and Agree with Plan of Care Patient           Patient will benefit from skilled therapeutic intervention in order to improve the following deficits and  impairments:  Abnormal gait,Decreased activity tolerance,Decreased endurance,Decreased strength,Decreased mobility,Decreased balance,Decreased coordination,Difficulty walking  Visit Diagnosis: Muscle weakness (generalized)  Difficulty in walking, not elsewhere classified     Problem List Patient Active Problem List   Diagnosis Date Noted   Chronic venous insufficiency 02/09/2020   Lymphedema 02/09/2020   Cardiac pacemaker 10/01/2019   Syncope 09/21/2019   Diabetes mellitus, controlled ([place] 09/21/2019   Complete heart block ([place] 09/18/2019   Sepsis ([place] 08/08/2019   UTI (urinary tract infection) 08/08/2019   Hypothyroidism 08/08/2019   HTN (hypertension) 08/08/2019   Diabetes ([place] 08/08/2019   Protein C deficiency ([place] 08/08/2019   Protein S deficiency ([place] 08/08/2019   Left leg pain 08/06/2019   Hip fracture ([place]  01/11/2019   Lumbar radiculopathy 09/04/2018   Abnormal EKG 08/14/2018   Pre-op evaluation 08/14/2018   Anticoagulant long-term use 01/24/2018   Poorly-controlled hypertension 01/24/2018   Osteoporosis with current pathological fracture 09/16/2017   Post-menopausal osteoporosis 09/16/2017   CKD (chronic kidney disease) stage 2, GFR 60-89 ml/min 07/17/2017   Closed compression fracture of L1 vertebra ([place]) 10/22/2016   Closed fracture of left scapula 10/22/2016   Closed fracture of sacrum ([place]) 10/22/2016   Closed fracture of sternum 10/22/2016   Multiple rib fractures 10/22/2016   Trimalleolar fracture of ankle, closed, left, with routine healing, subsequent encounter 03/11/2015   Deep vein thrombosis (DVT) of right lower extremity ([place]) 11/19/2014   Irritable bowel syndrome with diarrhea 11/19/2014   Closed fracture of phalanx of foot 05/15/2013   Knee injury 01/11/2012   Osteopenia 09/18/2011   Hypercalcemia 04/12/2011   Acute thromboembolism of deep veins of lower extremity ([place]) 02/20/2010   Diabetic nephropathy  ([place]) 02/20/2010   Mixed hyperlipidemia 02/20/2010   Vitamin D deficiency 02/20/2010   Karl Luke PT, DPT Netta Corrigan 10/21/2020, 5:23 PM  [place] MAIN [facility] SERVICES [address] [place], [place], [address] Phone: [phone]   Fax:  [phone]  Name: Sierra [name] MRN: [id number] Date of Birth: 1936-05-30

## 2020-10-22 ENCOUNTER — Ambulatory Visit: Payer: Medicare Other | Admitting: Physical Therapy

## 2020-10-22 ENCOUNTER — Other Ambulatory Visit: Payer: Self-pay

## 2020-10-22 DIAGNOSIS — M6281 Muscle weakness (generalized): Secondary | ICD-10-CM

## 2020-10-22 DIAGNOSIS — R262 Difficulty in walking, not elsewhere classified: Secondary | ICD-10-CM

## 2020-10-22 NOTE — Therapy (Signed)
[place] MAIN [facility] SERVICES [address] [place], [place], [address] Phone: [phone]   Fax:  [phone]  Physical Therapy Treatment  Patient Details  Name: Sierra [name] MRN: [id number] Date of Birth: [birth year] Referring Provider (PT): shah, [place]   Encounter Date: 10/22/2020   PT End of Session - 10/22/20 1040    Visit Number 11    Number of Visits 17    Date for PT Re-Evaluation 11/07/20    PT Start Time 1011    PT Stop Time 1100    PT Time Calculation (min) 49 min    Equipment Utilized During Treatment Gait belt    Activity Tolerance Patient tolerated treatment well    Behavior During Therapy WFL for tasks assessed/performed           Past Medical History:  Diagnosis Date   Anginal pain ([place])    Anxiety    Diabetes mellitus without complication ([place])    Hypertension    Hypothyroidism    Protein C deficiency ([place])    Protein S deficiency ([place])     Past Surgical History:  Procedure Laterality Date   BACK SURGERY     BREAST EXCISIONAL BIOPSY Left 2003?   benign   COLONOSCOPY N/A 09/12/2017   Procedure: COLONOSCOPY;  Surgeon: Lollie Sails, MD;  Location: [facility] ENDOSCOPY;  Service: Endoscopy;  Laterality: N/A;   COLONOSCOPY WITH PROPOFOL N/A 01/20/2016   Procedure: COLONOSCOPY WITH PROPOFOL;  Surgeon: Lollie Sails, MD;  Location: [facility] ENDOSCOPY;  Service: Endoscopy;  Laterality: N/A;   FRACTURE SURGERY     HIP ARTHROPLASTY Left 01/12/2019   Procedure: ARTHROPLASTY BIPOLAR HIP (HEMIARTHROPLASTY), LEFT;  Surgeon: Leim Fabry, MD;  Location: [facility] ORS;  Service: Orthopedics;  Laterality: Left;   PACEMAKER LEADLESS INSERTION N/A 09/20/2019   Procedure: PACEMAKER LEADLESS INSERTION;  Surgeon: Isaias Cowman, MD;  Location: [place] CV LAB;  Service: Cardiovascular;  Laterality: N/A;   PERIPHERAL VASCULAR CATHETERIZATION N/A 05/13/2015   Procedure: IVC Filter Removal;  Surgeon: Katha Cabal, MD;  Location: [place] CV LAB;  Service: Cardiovascular;  Laterality: N/A;   TEMPORARY PACEMAKER Right 09/20/2019   Procedure: TEMPORARY PACEMAKER;  Surgeon: Isaias Cowman, MD;  Location: [place] CV LAB;  Service: Cardiovascular;  Laterality: Right;   TONSILLECTOMY      There were no vitals filed for this visit.   Subjective Assessment - 10/22/20 1039    Subjective Patient states she went to her grandson's basketball game last night and did not have trouble with mobility and sitting for prolong period of time. She states her hip is sore from her fall that happened a couple days ago but nothing out of the normal.    Pertinent History Patient lives at [facility] and has PMHx of :Tremor bilateral hands, imbalance , Status post Micra AV 09/20/2019 for complete heart block,  Lumbar degenerative disc disease ,  Protein C and S deficiency- on warfarin, Insomnia-    Limitations Lifting;Standing;Walking;House hold activities;Other (comment)    How long can you sit comfortably? back pain is a limitation    How long can you stand comfortably? requires holding onto RW    How long can you walk comfortably? limited by stability and pain    Patient Stated Goals to get better balance and walk without a walker again    Currently in Pain? No/denies    Pain Onset More than a month ago  Treatment:   Patient seen for LSVT Adapted Daily Session Maximal Daily Exercises for facilitation/coordination of movement Sustained movements are designed to rescale the amplitude of movement output for generalization to daily functional activities .Performed as follows for 1 set of 10 repetitions each multidirectional sustained movements  1) Floor to ceiling , cues to hold for 10 seconds,needs modeling for correct positions , VC to reach out further and to hand placement 2) Side to side multidirectional Repetitive movements performed in sitting and are designed to  provide retraining effort needed for sustained muscle activation in tasks , cues to lean fwd and hold position x 10 counts: requires cueing for leg extension and to return to starting position  3)  Step forward and reach lateral,one hand on chair;  cues for good knee flex, cues to move UE and LE in opposite directions , cues to rotate  arms up to pronate his forearms 4) Sideways step and reach: one hand on chair: cues to turn  head sideways, and turn head back to neutral, and to rotate arms and pronate forearms; cue for slap and stomp to return back to center  5)Step and reach backwards ,one hand on chair: cues for UE back, getting toe up and bending back knee; then raising hand and stomping to return to upright position 6)Rock and reach forward/backward; one hand on chair , cues to reach far fwd with UE's, patient not comfortable with feet apart , not rocking very far to get a good weight shift, not able to raise  heel or raise  toes much 7) Rock and reach sideways, one hand on chair; cues for twist and look behind , only rotates side ways, challenged with  twist around or turn to look behind    functional component task with supervision 5 reps and simulated activities for: 1. Sit to stand x 10 with overhead press with medball Gr 2000  2. reaching high into a cabinet: reach for saebo ball transfer balls each UE, cues for posterior pelvic tilt  3. Walking with/without walker: ambulate 40 ft x 2 trials with cues for upright posture and foot clearance, L foot challenged with clearance with emphasis on turns.  4.taking bigger steps : verbal cues for foot clearance with step overs on cones 5.  no falls while ambulating              PT Short Term Goals - 10/21/20 1701      PT SHORT TERM GOAL #1   Title Patient will be independent in home exercise program to improve strength/mobility for better functional independence with ADLs.    Baseline 12/14: patient is not consistent with her HEP    Time  2    Period Weeks    Status On-going    Target Date 11/04/20             PT Long Term Goals - 10/21/20 1034      PT LONG TERM GOAL #1   Title Patient (< 4 years old) will complete five times sit to stand test in < 10 seconds indicating an increased LE strength and improved balance.    Baseline 09/30/20= 19.45 sec. 12/14: 16 sec    Time 4    Period Weeks    Status On-going    Target Date 11/04/20      PT LONG TERM GOAL #2   Title Patient will increase Berg Balance score by > 6 points to demonstrate decreased fall risk during functional activities.  Baseline 09/30/20: 41/56, 10/21/20: 46/56    Time 4    Period Weeks    Status Partially Met    Target Date 11/04/20      PT LONG TERM GOAL #3   Title Patient will increase six minute walk test distance to >1200 for progression to community ambulator and improve gait ability    Baseline 12/14: 705 ft    Time 4    Period Weeks    Status On-going    Target Date 11/04/20      PT LONG TERM GOAL #4   Title Patient will increase 10 meter walk test to >1.37ms as to improve gait speed for better community ambulation and to reduce fall risk.    Baseline 09/30/20=.84; 12/14: 0.88    Time 4    Period Weeks    Status New    Target Date 11/04/20      PT LONG TERM GOAL #5   Title Patient will reduce timed up and go to <11 seconds to reduce fall risk and demonstrate improved transfer/gait ability.    Baseline 11/23: 20.02 sec, 12/14: 12.7 sec    Time 4    Period Weeks    Status On-going    Target Date 11/04/20                 Plan - 10/22/20 1231    Clinical Impression Statement Patient completed LSVT BIG program with minimal verbal cues. Patient continues to get confused with positioning of self to set up with standing exercises. Therapist provided minimal verbal cues for BIG hands and to start and stop with BIG positions. Patient demonstrates shuffling and festinating gait pattern with turning to the left. Patient would  benefit from continued LSVT BIG program to improve patient's mobility and decrease risk for falls to improve patient's quality of life.    Personal Factors and Comorbidities Age;Past/Current Experience    Stability/Clinical Decision Making Stable/Uncomplicated    Rehab Potential Good    PT Frequency 4x / week    PT Duration 4 weeks    PT Treatment/Interventions Functional mobility training;Therapeutic activities;Neuromuscular re-education;Balance training;Therapeutic exercise;Gait training;Patient/family education;Manual techniques    PT Next Visit Plan LSVT BIG    PT Home Exercise Plan LSVT BIG next session    Consulted and Agree with Plan of Care Patient           Patient will benefit from skilled therapeutic intervention in order to improve the following deficits and impairments:  Abnormal gait,Decreased activity tolerance,Decreased endurance,Decreased strength,Decreased mobility,Decreased balance,Decreased coordination,Difficulty walking  Visit Diagnosis: Muscle weakness (generalized)  Difficulty in walking, not elsewhere classified     Problem List Patient Active Problem List   Diagnosis Date Noted   Chronic venous insufficiency 02/09/2020   Lymphedema 02/09/2020   Cardiac pacemaker 10/01/2019   Syncope 09/21/2019   Diabetes mellitus, controlled ([place] 09/21/2019   Complete heart block ([place] 09/18/2019   Sepsis ([place] 08/08/2019   UTI (urinary tract infection) 08/08/2019   Hypothyroidism 08/08/2019   HTN (hypertension) 08/08/2019   Diabetes ([place] 08/08/2019   Protein C deficiency ([place] 08/08/2019   Protein S deficiency ([place] 08/08/2019   Left leg pain 08/06/2019   Hip fracture ([place] 01/11/2019   Lumbar radiculopathy 09/04/2018   Abnormal EKG 08/14/2018   Pre-op evaluation 08/14/2018   Anticoagulant long-term use 01/24/2018   Poorly-controlled hypertension 01/24/2018   Osteoporosis with current pathological fracture 09/16/2017   Post-menopausal  osteoporosis 09/16/2017   CKD (chronic kidney disease) stage 2, GFR 60-89 ml/min  07/17/2017   Closed compression fracture of L1 vertebra ([name]) 10/22/2016   Closed fracture of left scapula 10/22/2016   Closed fracture of sacrum ([place]) 10/22/2016   Closed fracture of sternum 10/22/2016   Multiple rib fractures 10/22/2016   Trimalleolar fracture of ankle, closed, left, with routine healing, subsequent encounter 03/11/2015   Deep vein thrombosis (DVT) of right lower extremity ([place]) 11/19/2014   Irritable bowel syndrome with diarrhea 11/19/2014   Closed fracture of phalanx of foot 05/15/2013   Knee injury 01/11/2012   Osteopenia 09/18/2011   Hypercalcemia 04/12/2011   Acute thromboembolism of deep veins of lower extremity ([place]) 02/20/2010   Diabetic nephropathy ([place]) 02/20/2010   Mixed hyperlipidemia 02/20/2010   Vitamin D deficiency 02/20/2010   Karl Luke PT, DPT Netta Corrigan 10/22/2020, 12:38 PM  [place] MAIN [facility] SERVICES [address] [place], [place], [address] Phone: [phone]   Fax:  [phone]  Name: Sierra [name] MRN: [id number] Date of Birth: 1936/09/23

## 2020-10-23 ENCOUNTER — Other Ambulatory Visit: Payer: Self-pay

## 2020-10-23 ENCOUNTER — Ambulatory Visit
Admission: RE | Admit: 2020-10-23 | Discharge: 2020-10-23 | Disposition: A | Discharge status: Home / Self Care | Payer: Medicare Other | Source: Ambulatory Visit | Attending: Family Medicine | Admitting: Family Medicine

## 2020-10-23 ENCOUNTER — Ambulatory Visit: Payer: Medicare Other

## 2020-10-23 DIAGNOSIS — R1900 Intra-abdominal and pelvic swelling, mass and lump, unspecified site: Secondary | ICD-10-CM | POA: Diagnosis present

## 2020-10-23 LAB — POCT I-STAT CREATININE: Creatinine, Ser: 1.6 mg/dL — ABNORMAL HIGH (ref 0.44–1.00)

## 2020-10-23 MED ORDER — IOHEXOL 300 MG/ML  SOLN
75.0000 mL | Freq: Once | INTRAMUSCULAR | Status: AC | PRN
  Administered 2020-10-23: 75 mL via INTRAVENOUS

## 2020-10-27 ENCOUNTER — Other Ambulatory Visit: Payer: Self-pay

## 2020-10-27 ENCOUNTER — Ambulatory Visit: Payer: Medicare Other | Admitting: Physical Therapy

## 2020-10-27 DIAGNOSIS — M6281 Muscle weakness (generalized): Secondary | ICD-10-CM

## 2020-10-27 DIAGNOSIS — R262 Difficulty in walking, not elsewhere classified: Secondary | ICD-10-CM

## 2020-10-27 NOTE — Therapy (Signed)
[place] MAIN [facility] SERVICES [address] [place], [place], [address] Phone: [phone]   Fax:  [phone]  Physical Therapy Treatment  Patient Details  Name: Sierra [name] MRN: [id number] Date of Birth: 1936/03/17 Referring Provider (PT): [place] Books   Encounter Date: 10/27/2020   PT End of Session - 10/27/20 1029    Visit Number 12    Number of Visits 17    Date for PT Re-Evaluation 11/07/20    PT Start Time 1000    PT Stop Time 1100    PT Time Calculation (min) 60 min    Equipment Utilized During Treatment Gait belt    Activity Tolerance Patient tolerated treatment well    Behavior During Therapy WFL for tasks assessed/performed           Past Medical History:  Diagnosis Date  . Anginal pain ([place])   . Anxiety   . Diabetes mellitus without complication ([place])   . Hypertension   . Hypothyroidism   . Protein C deficiency ([place])   . Protein S deficiency [facility])     Past Surgical History:  Procedure Laterality Date  . BACK SURGERY    . BREAST EXCISIONAL BIOPSY Left 2003?   benign  . COLONOSCOPY N/A 09/12/2017   Procedure: COLONOSCOPY;  Surgeon: Lollie Sails, MD;  Location: [facility] ENDOSCOPY;  Service: Endoscopy;  Laterality: N/A;  . COLONOSCOPY WITH PROPOFOL N/A 01/20/2016   Procedure: COLONOSCOPY WITH PROPOFOL;  Surgeon: Lollie Sails, MD;  Location: [facility] ENDOSCOPY;  Service: Endoscopy;  Laterality: N/A;  . FRACTURE SURGERY    . HIP ARTHROPLASTY Left 01/12/2019   Procedure: ARTHROPLASTY BIPOLAR HIP (HEMIARTHROPLASTY), LEFT;  Surgeon: Leim Fabry, MD;  Location: [facility] ORS;  Service: Orthopedics;  Laterality: Left;  . PACEMAKER LEADLESS INSERTION N/A 09/20/2019   Procedure: PACEMAKER LEADLESS INSERTION;  Surgeon: Isaias Cowman, MD;  Location: [place] CV LAB;  Service: Cardiovascular;  Laterality: N/A;  . PERIPHERAL VASCULAR CATHETERIZATION N/A 05/13/2015   Procedure: IVC Filter Removal;  Surgeon: Katha Cabal, MD;  Location: [place] CV LAB;  Service: Cardiovascular;  Laterality: N/A;  . TEMPORARY PACEMAKER Right 09/20/2019   Procedure: TEMPORARY PACEMAKER;  Surgeon: Isaias Cowman, MD;  Location: [place] CV LAB;  Service: Cardiovascular;  Laterality: Right;  . TONSILLECTOMY      There were no vitals filed for this visit.   Subjective Assessment - 10/27/20 1030    Subjective Patient reports of having a frustrating weekend. She states that she was recently diagnosed with colon cancer. She is also frustrated that her husband is still at the hospital and waiting for him to get discharged.    Pertinent History Patient lives at [facility] and has PMHx of :Tremor bilateral hands, imbalance , Status post Micra AV 09/20/2019 for complete heart block,  Lumbar degenerative disc disease ,  Protein C and S deficiency- on warfarin, Insomnia-    Limitations Lifting;Standing;Walking;House hold activities;Other (comment)    How long can you sit comfortably? back pain is a limitation    How long can you stand comfortably? requires holding onto RW    How long can you walk comfortably? limited by stability and pain    Patient Stated Goals to get better balance and walk without a walker again    Currently in Pain? No/denies    Pain Onset More than a month ago           Treatment:   Patient seen for LSVT Adapted  Daily Session Maximal Daily Exercises for facilitation/coordination of movement Sustained movements are designed to rescale the amplitude of movement output for generalization to daily functional activities .Performed as follows for 1 set of 10 repetitions each multidirectional sustained movements  1) Floor to ceiling , cues to hold for 10 seconds,needs modeling for correct positions , VC to reach out further and to hand placement 2) Side to side multidirectional Repetitive movements performed in sitting and are designed to provide retraining effort needed for  sustained muscle activation in tasks , cues to lean fwd and hold position x 10 counts: requires cueing for leg extension and to return to starting position  3)  Step forward and reach lateral,one hand on chair;  cues for good knee flex, cues to move UE and LE in opposite directions , cues to rotate  arms up to pronate his forearms 4) Sideways step and reach: one hand on chair: cues to turn  head sideways, and turn head back to neutral, and to rotate arms and pronate forearms; cue for slap and stomp to return back to center  5)Step and reach backwards ,one hand on chair: cues for UE back, getting toe up and bending back knee; then raising hand and stomping to return to upright position 6)Rock and reach forward/backward; one hand on chair , cues to reach far fwd with UE's, patient not comfortable with feet apart , not rocking very far to get a good weight shift, not able to raise  heel or raise  toes much 7) Rock and reach sideways, one hand on chair; cues for twist and look behind , only rotates side ways, challenged with  twist around or turn to look behind    functional component task with supervision 5 reps and simulated activities for: 1. Sit to stand x 10 with overhead press with medball Gr 2000  2. reaching high into a cabinet: reach for saebo ball transfer balls each UE, cues for posterior pelvic tilt  3. Walking with/without walker: ambulate 40 ft x 2 trials with cues for upright posture and foot clearance, L foot challenged with clearance with emphasis on turns.  4.taking bigger steps : verbal cues for foot clearance with step overs on cones 5.  no falls while ambulating    BIG ambulation in hallway: emphasis on left hip flexion and knee flexion during swing phase to avoid shuffling gait pattern.                          PT Short Term Goals - 10/21/20 1701      PT SHORT TERM GOAL #1   Title Patient will be independent in home exercise program to improve  strength/mobility for better functional independence with ADLs.    Baseline 12/14: patient is not consistent with her HEP    Time 2    Period Weeks    Status On-going    Target Date 11/04/20             PT Long Term Goals - 10/21/20 1034      PT LONG TERM GOAL #1   Title Patient (< 71 years old) will complete five times sit to stand test in < 10 seconds indicating an increased LE strength and improved balance.    Baseline 09/30/20= 19.45 sec. 12/14: 16 sec    Time 4    Period Weeks    Status On-going    Target Date 11/04/20      PT  LONG TERM GOAL #2   Title Patient will increase Berg Balance score by > 6 points to demonstrate decreased fall risk during functional activities.    Baseline 09/30/20: 41/56, 10/21/20: 46/56    Time 4    Period Weeks    Status Partially Met    Target Date 11/04/20      PT LONG TERM GOAL #3   Title Patient will increase six minute walk test distance to >1200 for progression to community ambulator and improve gait ability    Baseline 12/14: 705 ft    Time 4    Period Weeks    Status On-going    Target Date 11/04/20      PT LONG TERM GOAL #4   Title Patient will increase 10 meter walk test to >1.73ms as to improve gait speed for better community ambulation and to reduce fall risk.    Baseline 09/30/20=.84; 12/14: 0.88    Time 4    Period Weeks    Status New    Target Date 11/04/20      PT LONG TERM GOAL #5   Title Patient will reduce timed up and go to <11 seconds to reduce fall risk and demonstrate improved transfer/gait ability.    Baseline 11/23: 20.02 sec, 12/14: 12.7 sec    Time 4    Period Weeks    Status On-going    Target Date 11/04/20                 Plan - 10/27/20 1246    Clinical Impression Statement Patient completed LSVT BIG program requiring minimal verbal and tactile cues for proper positioning to complete each task. Patient ambulated BIG for 100-120 ft with no AD at a time requiring frequent seated rest breaks  due to festinating. Patient demonstrates decreased left hip flexion causing shuffling gait pattern with short strides due to decreased weight bearing on left hip. Patient would benefit from skilled PT services to improve strength, balance skills, and functional activity tolerance to improve patient's quality of life.    Personal Factors and Comorbidities Age;Past/Current Experience    Stability/Clinical Decision Making Stable/Uncomplicated    Rehab Potential Good    PT Frequency 4x / week    PT Duration 4 weeks    PT Treatment/Interventions Functional mobility training;Therapeutic activities;Neuromuscular re-education;Balance training;Therapeutic exercise;Gait training;Patient/family education;Manual techniques    PT Next Visit Plan LSVT BIG    PT Home Exercise Plan LSVT BIG next session    Consulted and Agree with Plan of Care Patient           Patient will benefit from skilled therapeutic intervention in order to improve the following deficits and impairments:  Abnormal gait,Decreased activity tolerance,Decreased endurance,Decreased strength,Decreased mobility,Decreased balance,Decreased coordination,Difficulty walking  Visit Diagnosis: Muscle weakness (generalized)  Difficulty in walking, not elsewhere classified     Problem List Patient Active Problem List   Diagnosis Date Noted  . Chronic venous insufficiency 02/09/2020  . Lymphedema 02/09/2020  . Cardiac pacemaker 10/01/2019  . Syncope 09/21/2019  . Diabetes mellitus, controlled (HB[place] 09/21/2019  . Complete heart block ([place] 09/18/2019  . Sepsis ([place] 08/08/2019  . UTI (urinary tract infection) 08/08/2019  . Hypothyroidism 08/08/2019  . HTN (hypertension) 08/08/2019  . Diabetes ([place] 08/08/2019  . Protein C deficiency ([place] 08/08/2019  . Protein S deficiency ([place] 08/08/2019  . Left leg pain 08/06/2019  . Hip fracture ([place] 01/11/2019  . Lumbar radiculopathy 09/04/2018  . Abnormal EKG 08/14/2018  . Pre-op evaluation  08/14/2018  .  Anticoagulant long-term use 01/24/2018  . Poorly-controlled hypertension 01/24/2018  . Osteoporosis with current pathological fracture 09/16/2017  . Post-menopausal osteoporosis 09/16/2017  . CKD (chronic kidney disease) stage 2, GFR 60-89 ml/min 07/17/2017  . Closed compression fracture of L1 vertebra ([place]) 10/22/2016  . Closed fracture of left scapula 10/22/2016  . Closed fracture of sacrum ([place]) 10/22/2016  . Closed fracture of sternum 10/22/2016  . Multiple rib fractures 10/22/2016  . Trimalleolar fracture of ankle, closed, left, with routine healing, subsequent encounter 03/11/2015  . Deep vein thrombosis (DVT) of right lower extremity ([place]) 11/19/2014  . Irritable bowel syndrome with diarrhea 11/19/2014  . Closed fracture of phalanx of foot 05/15/2013  . Knee injury 01/11/2012  . Osteopenia 09/18/2011  . Hypercalcemia 04/12/2011  . Acute thromboembolism of deep veins of lower extremity ([place]) 02/20/2010  . Diabetic nephropathy ([place]) 02/20/2010  . Mixed hyperlipidemia 02/20/2010  . Vitamin D deficiency 02/20/2010   Karl Luke PT, DPT Netta Corrigan 10/27/2020, 12:52 PM  [place] MAIN [facility] SERVICES [address] [place], [place], [address] Phone: [phone]   Fax:  [phone]  Name: Sierra [name] MRN: [id number] Date of Birth: Dec 28, 1935

## 2020-10-28 ENCOUNTER — Ambulatory Visit: Payer: Medicare Other | Admitting: Physical Therapy

## 2020-10-28 ENCOUNTER — Other Ambulatory Visit: Payer: Self-pay

## 2020-10-28 DIAGNOSIS — M6281 Muscle weakness (generalized): Secondary | ICD-10-CM

## 2020-10-28 DIAGNOSIS — R262 Difficulty in walking, not elsewhere classified: Secondary | ICD-10-CM

## 2020-10-28 NOTE — Therapy (Signed)
[place] MAIN [facility] SERVICES [address] [place], [place], [address] Phone: [phone]   Fax:  [phone]  Physical Therapy Treatment  Patient Details  Name: Sierra [name] MRN: [id number] Date of Birth: 06-Mar-1936 Referring Provider (PT): Jennings Books   Encounter Date: 10/28/2020   PT End of Session - 10/28/20 1032    Visit Number 13    Number of Visits 17    Date for PT Re-Evaluation 11/07/20    PT Start Time 1007    PT Stop Time 1100    PT Time Calculation (min) 53 min    Equipment Utilized During Treatment Gait belt    Activity Tolerance Patient tolerated treatment well    Behavior During Therapy WFL for tasks assessed/performed           Past Medical History:  Diagnosis Date  . Anginal pain ([place])   . Anxiety   . Diabetes mellitus without complication ([place])   . Hypertension   . Hypothyroidism   . Protein C deficiency ([place])   . Protein S deficiency [facility])     Past Surgical History:  Procedure Laterality Date  . BACK SURGERY    . BREAST EXCISIONAL BIOPSY Left 2003?   benign  . COLONOSCOPY N/A 09/12/2017   Procedure: COLONOSCOPY;  Surgeon: Lollie Sails, MD;  Location: [facility] ENDOSCOPY;  Service: Endoscopy;  Laterality: N/A;  . COLONOSCOPY WITH PROPOFOL N/A 01/20/2016   Procedure: COLONOSCOPY WITH PROPOFOL;  Surgeon: Lollie Sails, MD;  Location: [facility] ENDOSCOPY;  Service: Endoscopy;  Laterality: N/A;  . FRACTURE SURGERY    . HIP ARTHROPLASTY Left 01/12/2019   Procedure: ARTHROPLASTY BIPOLAR HIP (HEMIARTHROPLASTY), LEFT;  Surgeon: Leim Fabry, MD;  Location: [facility] ORS;  Service: Orthopedics;  Laterality: Left;  . PACEMAKER LEADLESS INSERTION N/A 09/20/2019   Procedure: PACEMAKER LEADLESS INSERTION;  Surgeon: Isaias Cowman, MD;  Location: [place] CV LAB;  Service: Cardiovascular;  Laterality: N/A;  . PERIPHERAL VASCULAR CATHETERIZATION N/A 05/13/2015   Procedure: IVC Filter Removal;  Surgeon: Katha Cabal, MD;  Location: [place] CV LAB;  Service: Cardiovascular;  Laterality: N/A;  . TEMPORARY PACEMAKER Right 09/20/2019   Procedure: TEMPORARY PACEMAKER;  Surgeon: Isaias Cowman, MD;  Location: [place] CV LAB;  Service: Cardiovascular;  Laterality: Right;  . TONSILLECTOMY      There were no vitals filed for this visit.   Subjective Assessment - 10/28/20 1033    Subjective Patient reports of having a ground level fall near a curb earlier this morning. She was taking out her recycling bin and missed the curb and had a fall. She was able to stand up independently with support of a car.    Pertinent History Patient lives at [facility] and has PMHx of :Tremor bilateral hands, imbalance , Status post Micra AV 09/20/2019 for complete heart block,  Lumbar degenerative disc disease ,  Protein C and S deficiency- on warfarin, Insomnia-    Limitations Lifting;Standing;Walking;House hold activities;Other (comment)    How long can you sit comfortably? back pain is a limitation    How long can you stand comfortably? requires holding onto RW    How long can you walk comfortably? limited by stability and pain    Patient Stated Goals to get better balance and walk without a walker again    Currently in Pain? Yes    Pain Score 5     Pain Location Hip    Pain Orientation Left    Pain  Descriptors / Indicators Aching    Pain Onset More than a month ago             Treatment:   Patient seen for LSVT Adapted Daily Session Maximal Daily Exercises for facilitation/coordination of movement Sustained movements are designed to rescale the amplitude of movement output for generalization to daily functional activities .Performed as follows for 1 set of 10 repetitions each multidirectional sustained movements  1) Floor to ceiling , cues to hold for 10 seconds,needs modeling for correct positions , VC to reach out further and to hand placement 2) Side to side  multidirectional Repetitive movements performed in sitting and are designed to provide retraining effort needed for sustained muscle activation in tasks , cues to lean fwd and hold position x 10 counts: requires cueing for leg extension and to return to starting position  3)  Step forward and reach lateral,one hand on chair;  cues for good knee flex, cues to move UE and LE in opposite directions , cues to rotate  arms up to pronate his forearms 4) Sideways step and reach: one hand on chair: cues to turn  head sideways, and turn head back to neutral, and to rotate arms and pronate forearms; cue for slap and stomp to return back to center  5)Step and reach backwards ,one hand on chair: cues for UE back, getting toe up and bending back knee; then raising hand and stomping to return to upright position 6)Rock and reach forward/backward; one hand on chair , cues to reach far fwd with UE's, patient not comfortable with feet apart , not rocking very far to get a good weight shift, not able to raise  heel or raise  toes much 7) Rock and reach sideways, one hand on chair; cues for twist and look behind , only rotates side ways, challenged with  twist around or turn to look behind    functional component task with supervision 5 reps and simulated activities for: 1. Sit to stand x 10 with overh ead press with medball Gr 2000  2. reaching high into a cabinet: reach for saebo ball transfer balls each UE, cues for posterior pelvic tilt  3. Walking with/without walker: ambulate 40 ft x 2 trials with cues for upright posture and foot clearance, L foot challenged with clearance with emphasis on turns.  4.taking bigger steps : verbal cues for foot clearance with step overs on cones 5.  no falls while ambulating      Ambulation: BIG steps with ambulation and turns to the left.                          PT Short Term Goals - 10/21/20 1701      PT SHORT TERM GOAL #1   Title Patient will be  independent in home exercise program to improve strength/mobility for better functional independence with ADLs.    Baseline 12/14: patient is not consistent with her HEP    Time 2    Period Weeks    Status On-going    Target Date 11/04/20             PT Long Term Goals - 10/21/20 1034      PT LONG TERM GOAL #1   Title Patient (< 74 years old) will complete five times sit to stand test in < 10 seconds indicating an increased LE strength and improved balance.    Baseline 09/30/20= 19.45 sec. 12/14: 16 sec  Time 4    Period Weeks    Status On-going    Target Date 11/04/20      PT LONG TERM GOAL #2   Title Patient will increase Berg Balance score by > 6 points to demonstrate decreased fall risk during functional activities.    Baseline 09/30/20: 41/56, 10/21/20: 46/56    Time 4    Period Weeks    Status Partially Met    Target Date 11/04/20      PT LONG TERM GOAL #3   Title Patient will increase six minute walk test distance to >1200 for progression to community ambulator and improve gait ability    Baseline 12/14: 705 ft    Time 4    Period Weeks    Status On-going    Target Date 11/04/20      PT LONG TERM GOAL #4   Title Patient will increase 10 meter walk test to >1.77ms as to improve gait speed for better community ambulation and to reduce fall risk.    Baseline 09/30/20=.84; 12/14: 0.88    Time 4    Period Weeks    Status New    Target Date 11/04/20      PT LONG TERM GOAL #5   Title Patient will reduce timed up and go to <11 seconds to reduce fall risk and demonstrate improved transfer/gait ability.    Baseline 11/23: 20.02 sec, 12/14: 12.7 sec    Time 4    Period Weeks    Status On-going    Target Date 11/04/20                 Plan - 10/28/20 1244    Clinical Impression Statement Patient completed LSVT BIG program with minimal verbal cues with good demonstration. Focused on improving stride and step length on LLE with fair demonstration. Patient  continues to demonstrate festinating gait pattern with turns towards the left. Therapist provided visual and verbal cues to increase hip flexion and knee flexion during turns to avoid falls. Therapy will continue to improve strength, mobility, and balance skills to improve patient's quality of life.    Personal Factors and Comorbidities Age;Past/Current Experience    Stability/Clinical Decision Making Stable/Uncomplicated    Rehab Potential Good    PT Frequency 4x / week    PT Duration 4 weeks    PT Treatment/Interventions Functional mobility training;Therapeutic activities;Neuromuscular re-education;Balance training;Therapeutic exercise;Gait training;Patient/family education;Manual techniques    PT Next Visit Plan LSVT BIG    PT Home Exercise Plan LSVT BIG next session    Consulted and Agree with Plan of Care Patient           Patient will benefit from skilled therapeutic intervention in order to improve the following deficits and impairments:  Abnormal gait,Decreased activity tolerance,Decreased endurance,Decreased strength,Decreased mobility,Decreased balance,Decreased coordination,Difficulty walking  Visit Diagnosis: Muscle weakness (generalized)  Difficulty in walking, not elsewhere classified     Problem List Patient Active Problem List   Diagnosis Date Noted  . Chronic venous insufficiency 02/09/2020  . Lymphedema 02/09/2020  . Cardiac pacemaker 10/01/2019  . Syncope 09/21/2019  . Diabetes mellitus, controlled ([place] 09/21/2019  . Complete heart block ([place] 09/18/2019  . Sepsis ([place] 08/08/2019  . UTI (urinary tract infection) 08/08/2019  . Hypothyroidism 08/08/2019  . HTN (hypertension) 08/08/2019  . Diabetes ([place] 08/08/2019  . Protein C deficiency ([place] 08/08/2019  . Protein S deficiency ([place] 08/08/2019  . Left leg pain 08/06/2019  . Hip fracture ([place] 01/11/2019  . Lumbar radiculopathy  09/04/2018  . Abnormal EKG 08/14/2018  . Pre-op evaluation 08/14/2018  .  Anticoagulant long-term use 01/24/2018  . Poorly-controlled hypertension 01/24/2018  . Osteoporosis with current pathological fracture 09/16/2017  . Post-menopausal osteoporosis 09/16/2017  . CKD (chronic kidney disease) stage 2, GFR 60-89 ml/min 07/17/2017  . Closed compression fracture of L1 vertebra ([place]) 10/22/2016  . Closed fracture of left scapula 10/22/2016  . Closed fracture of sacrum ([place]) 10/22/2016  . Closed fracture of sternum 10/22/2016  . Multiple rib fractures 10/22/2016  . Trimalleolar fracture of ankle, closed, left, with routine healing, subsequent encounter 03/11/2015  . Deep vein thrombosis (DVT) of right lower extremity ([place]) 11/19/2014  . Irritable bowel syndrome with diarrhea 11/19/2014  . Closed fracture of phalanx of foot 05/15/2013  . Knee injury 01/11/2012  . Osteopenia 09/18/2011  . Hypercalcemia 04/12/2011  . Acute thromboembolism of deep veins of lower extremity ([place]) 02/20/2010  . Diabetic nephropathy ([place]) 02/20/2010  . Mixed hyperlipidemia 02/20/2010  . Vitamin D deficiency 02/20/2010    Sierra [name] 10/28/2020, 12:56 PM  [place] MAIN [facility] SERVICES [address] [place], [place], [address] Phone: [phone]   Fax:  [phone]  Name: Sierra [name] MRN: [id number] Date of Birth: 05-19-1936

## 2020-10-29 ENCOUNTER — Ambulatory Visit: Admit: 2020-10-29 | Discharge: 2020-10-30 | Payer: MEDICARE

## 2020-10-29 ENCOUNTER — Other Ambulatory Visit: Payer: Self-pay

## 2020-10-29 ENCOUNTER — Ambulatory Visit: Payer: Medicare Other | Admitting: Physical Therapy

## 2020-10-29 DIAGNOSIS — M6281 Muscle weakness (generalized): Secondary | ICD-10-CM | POA: Diagnosis not present

## 2020-10-29 DIAGNOSIS — R262 Difficulty in walking, not elsewhere classified: Secondary | ICD-10-CM

## 2020-10-29 NOTE — Therapy (Signed)
[place] MAIN [facility] SERVICES [address] [place], [place], [address] Phone: [phone]   Fax:  [phone]  Physical Therapy Treatment  Patient Details  Name: Sierra [name] MRN: [id number] Date of Birth: 15-Jul-1936 Referring Provider (PT): shah, [place]   Encounter Date: 10/29/2020   PT End of Session - 10/29/20 0959    Visit Number 14    Number of Visits 17    Date for PT Re-Evaluation 11/07/20    PT Start Time 1000    PT Stop Time 1100    PT Time Calculation (min) 60 min    Equipment Utilized During Treatment Gait belt    Activity Tolerance Patient tolerated treatment well    Behavior During Therapy WFL for tasks assessed/performed           Past Medical History:  Diagnosis Date   Anginal pain ([place])    Anxiety    Diabetes mellitus without complication ([place])    Hypertension    Hypothyroidism    Protein C deficiency ([place])    Protein S deficiency ([place])     Past Surgical History:  Procedure Laterality Date   BACK SURGERY     BREAST EXCISIONAL BIOPSY Left 2003?   benign   COLONOSCOPY N/A 09/12/2017   Procedure: COLONOSCOPY;  Surgeon: Lollie Sails, MD;  Location[facility] ENDOSCOPY;  Service: Endoscopy;  Laterality: N/A;   COLONOSCOPY WITH PROPOFOL N/A 01/20/2016   Procedure: COLONOSCOPY WITH PROPOFOL;  Surgeon: Lollie Sails, MD;  Location: [facility] ENDOSCOPY;  Service: Endoscopy;  Laterality: N/A;   FRACTURE SURGERY     HIP ARTHROPLASTY Left 01/12/2019   Procedure: ARTHROPLASTY BIPOLAR HIP (HEMIARTHROPLASTY), LEFT;  Surgeon: Leim Fabry, MD;  Location: [facility] ORS;  Service: Orthopedics;  Laterality: Left;   PACEMAKER LEADLESS INSERTION N/A 09/20/2019   Procedure: PACEMAKER LEADLESS INSERTION;  Surgeon: Isaias Cowman, MD;  Location: [place] CV LAB;  Service: Cardiovascular;  Laterality: N/A;   PERIPHERAL VASCULAR CATHETERIZATION N/A 05/13/2015   Procedure: IVC Filter Removal;  Surgeon: Katha Cabal, MD;  Location: [place] CV LAB;  Service: Cardiovascular;  Laterality: N/A;   TEMPORARY PACEMAKER Right 09/20/2019   Procedure: TEMPORARY PACEMAKER;  Surgeon: Isaias Cowman, MD;  Location: [place] CV LAB;  Service: Cardiovascular;  Laterality: Right;   TONSILLECTOMY      There were no vitals filed for this visit.      Treatment:   Patient seen for LSVT Adapted Daily Session Maximal Daily Exercises for facilitation/coordination of movement Sustained movements are designed to rescale the amplitude of movement output for generalization to daily functional activities .Performed as follows for 1 set of 10 repetitions each multidirectional sustained movements  1) Floor to ceiling 2) Side to side multidirectional Repetitive movements performed in sitting and are designed to provide retraining effort needed for sustained muscle activation in tasks , cues to lean fwd and hold position x 10 counts: requires cueing for leg extension and to return to starting position  3)  Step forward and reach lateral,one hand on chair;  cues for good knee flex, cues to move UE and LE in opposite directions , cues to rotate  arms up to pronate his forearms 4) Sideways step and reach: one hand on chair: cues to turn  head sideways, and turn head back to neutral, and to rotate arms and pronate forearms; cue for slap and stomp to return back to center  5)Step and reach backwards ,one hand on chair: cues for UE back, getting  toe up and bending back knee; then raising hand and stomping to return to upright position 6)Rock and reach forward/backward; one hand on chair , cues to reach far fwd with UE's, patient not comfortable with feet apart , not rocking very far to get a good weight shift, not able to raise  heel or raise  toes much 7) Rock and reach sideways, one hand on chair; cues for twist and look behind , only rotates side ways, challenged with  twist around or turn to look behind     functional component task with supervision 5 reps and simulated activities for: 1. Sit to stand x 10 with overh ead press with medball Gr 2000  2. reaching high into a cabinet: reach for saebo ball transfer balls each UE, cues for posterior pelvic tilt  3. Walking with/without walker: ambulate 40 ft x 2 trials with cues for upright posture and foot clearance, L foot challenged with clearance with emphasis on turns.  4.taking bigger steps : verbal cues for foot clearance with step overs on cones 5.  no falls while ambulating   Sit to stands with BUE support x 15 reps; limited due to pain in knees.  NuStep x L1 x 6 minutes for BIG reciprocal alternating continuous movement  Donning/doffing darts on a Christmas stocking above head x 4 times                           PT Short Term Goals - 10/21/20 1701      PT SHORT TERM GOAL #1   Title Patient will be independent in home exercise program to improve strength/mobility for better functional independence with ADLs.    Baseline 12/14: patient is not consistent with her HEP    Time 2    Period Weeks    Status On-going    Target Date 11/04/20             PT Long Term Goals - 10/21/20 1034      PT LONG TERM GOAL #1   Title Patient (< 61 years old) will complete five times sit to stand test in < 10 seconds indicating an increased LE strength and improved balance.    Baseline 09/30/20= 19.45 sec. 12/14: 16 sec    Time 4    Period Weeks    Status On-going    Target Date 11/04/20      PT LONG TERM GOAL #2   Title Patient will increase Berg Balance score by > 6 points to demonstrate decreased fall risk during functional activities.    Baseline 09/30/20: 41/56, 10/21/20: 46/56    Time 4    Period Weeks    Status Partially Met    Target Date 11/04/20      PT LONG TERM GOAL #3   Title Patient will increase six minute walk test distance to >1200 for progression to community ambulator and improve gait ability     Baseline 12/14: 705 ft    Time 4    Period Weeks    Status On-going    Target Date 11/04/20      PT LONG TERM GOAL #4   Title Patient will increase 10 meter walk test to >1.38ms as to improve gait speed for better community ambulation and to reduce fall risk.    Baseline 09/30/20=.84; 12/14: 0.88    Time 4    Period Weeks    Status New    Target Date 11/04/20  PT LONG TERM GOAL #5   Title Patient will reduce timed up and go to <11 seconds to reduce fall risk and demonstrate improved transfer/gait ability.    Baseline 11/23: 20.02 sec, 12/14: 12.7 sec    Time 4    Period Weeks    Status On-going    Target Date 11/04/20                 Plan - 10/29/20 1109    Clinical Impression Statement Patient reports of increased pain in left hip up to 5-6/10 with ambulation due to recent fall. Patient demonstrates decreased hip flexion and knee flexion causing shuffling gait pattern due to muscle fatigue and pain. Patient demonstrates more independence with completing LSVT BIG program as evidenced by requiring less verbal cues. Patient will continue to benefit from skilled physical therapy to improve generalized strength, ROM, and capacity for functional activity.    Personal Factors and Comorbidities Age;Past/Current Experience    Stability/Clinical Decision Making Stable/Uncomplicated    Rehab Potential Good    PT Frequency 4x / week    PT Duration 4 weeks    PT Treatment/Interventions Functional mobility training;Therapeutic activities;Neuromuscular re-education;Balance training;Therapeutic exercise;Gait training;Patient/family education;Manual techniques    PT Next Visit Plan LSVT BIG    PT Home Exercise Plan LSVT BIG next session    Consulted and Agree with Plan of Care Patient           Patient will benefit from skilled therapeutic intervention in order to improve the following deficits and impairments:  Abnormal gait,Decreased activity tolerance,Decreased  endurance,Decreased strength,Decreased mobility,Decreased balance,Decreased coordination,Difficulty walking  Visit Diagnosis: Difficulty in walking, not elsewhere classified  Muscle weakness (generalized)     Problem List Patient Active Problem List   Diagnosis Date Noted   Chronic venous insufficiency 02/09/2020   Lymphedema 02/09/2020   Cardiac pacemaker 10/01/2019   Syncope 09/21/2019   Diabetes mellitus, controlled ([place]) 09/21/2019   Complete heart block ([place]) 09/18/2019   Sepsis ([place]) 08/08/2019   UTI (urinary tract infection) 08/08/2019   Hypothyroidism 08/08/2019   HTN (hypertension) 08/08/2019   Diabetes ([place]) 08/08/2019   Protein C deficiency ([place]) 08/08/2019   Protein S deficiency ([place]) 08/08/2019   Left leg pain 08/06/2019   Hip fracture ([place]) 01/11/2019   Lumbar radiculopathy 09/04/2018   Abnormal EKG 08/14/2018   Pre-op evaluation 08/14/2018   Anticoagulant long-term use 01/24/2018   Poorly-controlled hypertension 01/24/2018   Osteoporosis with current pathological fracture 09/16/2017   Post-menopausal osteoporosis 09/16/2017   CKD (chronic kidney disease) stage 2, GFR 60-89 ml/min 07/17/2017   Closed compression fracture of L1 vertebra ([place]) 10/22/2016   Closed fracture of left scapula 10/22/2016   Closed fracture of sacrum ([place]) 10/22/2016   Closed fracture of sternum 10/22/2016   Multiple rib fractures 10/22/2016   Trimalleolar fracture of ankle, closed, left, with routine healing, subsequent encounter 03/11/2015   Deep vein thrombosis (DVT) of right lower extremity ([place]) 11/19/2014   Irritable bowel syndrome with diarrhea 11/19/2014   Closed fracture of phalanx of foot 05/15/2013   Knee injury 01/11/2012   Osteopenia 09/18/2011   Hypercalcemia 04/12/2011   Acute thromboembolism of deep veins of lower extremity ([place]) 02/20/2010   Diabetic nephropathy ([place]) 02/20/2010   Mixed hyperlipidemia 02/20/2010   Vitamin D  deficiency 02/20/2010   Karl Luke PT, DPT Netta Corrigan 10/29/2020, 12:23 PM  [place] MAIN [facility] SERVICES [address] [place], [place], [address] Phone: [phone]   Fax:  [phone]  Name: Merlean Pizzini MRN: [id number] Date of Birth: May 29, 1936

## 2020-10-30 ENCOUNTER — Ambulatory Visit: Payer: Medicare Other | Admitting: Physical Therapy

## 2020-10-30 ENCOUNTER — Other Ambulatory Visit: Payer: Self-pay

## 2020-10-30 DIAGNOSIS — M6281 Muscle weakness (generalized): Secondary | ICD-10-CM

## 2020-10-30 DIAGNOSIS — R262 Difficulty in walking, not elsewhere classified: Secondary | ICD-10-CM

## 2020-10-30 NOTE — Therapy (Signed)
[place] MAIN [facility] SERVICES [address] [place], [place], [address] Phone: [phone]   Fax:  [phone]  Physical Therapy Treatment  Patient Details  Name: Sierra [name] MRN: [id number] Date of Birth: [birth year] Referring Provider (PT): Jennings Books   Encounter Date: 10/30/2020   PT End of Session - 10/30/20 1010    Visit Number 15    Number of Visits 17    Date for PT Re-Evaluation 11/07/20    PT Start Time 1005    PT Stop Time 1100    PT Time Calculation (min) 55 min    Equipment Utilized During Treatment Gait belt    Activity Tolerance Patient tolerated treatment well    Behavior During Therapy WFL for tasks assessed/performed           Past Medical History:  Diagnosis Date  . Anginal pain ([place])   . Anxiety   . Diabetes mellitus without complication ([place])   . Hypertension   . Hypothyroidism   . Protein C deficiency ([place])   . Protein S deficiency [facility])     Past Surgical History:  Procedure Laterality Date  . BACK SURGERY    . BREAST EXCISIONAL BIOPSY Left 2003?   benign  . COLONOSCOPY N/A 09/12/2017   Procedure: COLONOSCOPY;  Surgeon: Lollie Sails, MD;  Location: [facility] ENDOSCOPY;  Service: Endoscopy;  Laterality: N/A;  . COLONOSCOPY WITH PROPOFOL N/A 01/20/2016   Procedure: COLONOSCOPY WITH PROPOFOL;  Surgeon: Lollie Sails, MD;  Location[facility] ENDOSCOPY;  Service: Endoscopy;  Laterality: N/A;  . FRACTURE SURGERY    . HIP ARTHROPLASTY Left 01/12/2019   Procedure: ARTHROPLASTY BIPOLAR HIP (HEMIARTHROPLASTY), LEFT;  Surgeon: Leim Fabry, MD;  Location: [facility] ORS;  Service: Orthopedics;  Laterality: Left;  . PACEMAKER LEADLESS INSERTION N/A 09/20/2019   Procedure: PACEMAKER LEADLESS INSERTION;  Surgeon: Isaias Cowman, MD;  Location: [place] CV LAB;  Service: Cardiovascular;  Laterality: N/A;  . PERIPHERAL VASCULAR CATHETERIZATION N/A 05/13/2015   Procedure: IVC Filter Removal;  Surgeon: Katha Cabal, MD;  Location: [place] CV LAB;  Service: Cardiovascular;  Laterality: N/A;  . TEMPORARY PACEMAKER Right 09/20/2019   Procedure: TEMPORARY PACEMAKER;  Surgeon: Isaias Cowman, MD;  Location: [place] CV LAB;  Service: Cardiovascular;  Laterality: Right;  . TONSILLECTOMY      There were no vitals filed for this visit.   Subjective Assessment - 10/30/20 1029    Subjective Patient denies of any new symptoms or falls since last therapy session. She reports that she did her HEP yesterday and had difficulty with exercises 5-7 on LSVT program.    Pertinent History Patient lives [facility] and has PMHx of :Tremor bilateral hands, imbalance , Status post Micra AV 09/20/2019 for complete heart block,  Lumbar degenerative disc disease ,  Protein C and S deficiency- on warfarin, Insomnia-    Limitations Lifting;Standing;Walking;House hold activities;Other (comment)    How long can you sit comfortably? back pain is a limitation    How long can you stand comfortably? requires holding onto RW    How long can you walk comfortably? limited by stability and pain    Patient Stated Goals to get better balance and walk without a walker again    Currently in Pain? No/denies    Pain Onset More than a month ago           Treatment:   Patient seen for LSVT Adapted Daily Session Maximal Daily Exercises for facilitation/coordination  of movement Sustained movements are designed to rescale the amplitude of movement output for generalization to daily functional activities .Performed as follows for 1 set of 10 repetitions each multidirectional sustained movements  1) Floor to ceiling 2) Side to side multidirectional Repetitive movements performed in sitting and are designed to provide retraining effort needed for sustained muscle activation in tasks , cues to lean fwd and hold position x 10 counts: requires cueing for leg extension and to return to starting position  3)   Step forward and reach lateral,one hand on chair;  cues for good knee flex, cues to move UE and LE in opposite directions , cues to rotate  arms up to pronate his forearms 4) Sideways step and reach: one hand on chair: cues to turn  head sideways, and turn head back to neutral, and to rotate arms and pronate forearms; cue for slap and stomp to return back to center  5)Step and reach backwards ,one hand on chair: cues for UE back, getting toe up and bending back knee; then raising hand and stomping to return to upright position 6)Rock and reach forward/backward; one hand on chair , cues to reach far fwd with UE's, patient not comfortable with feet apart , not rocking very far to get a good weight shift, not able to raise  heel or raise  toes much 7) Rock and reach sideways, one hand on chair; cues for twist and look behind , only rotates side ways, challenged with  twist around or turn to look behind    functional component task with supervision 5 reps and simulated activities for: 1. Sit to stand x 10 with overh ead press with medball Gr 2000  2. reaching high into a cabinet: reach for saebo ball transfer balls each UE, cues for posterior pelvic tilt  3. Walking with/without walker: ambulate 40 ft x 2 trials with cues for upright posture and foot clearance, L foot challenged with clearance with emphasis on turns.  4.taking bigger steps : verbal cues for foot clearance with step overs on cones 5.  no falls while ambulating   Sit to stands with BUE support x 15 reps; limited due to pain in knees.  NuStep x L1 x 6 minutes for BIG reciprocal alternating continuous movement  Donning/doffing darts on a Christmas stocking above head x 4 times     PT Short Term Goals - 10/21/20 1701      PT SHORT TERM GOAL #1   Title Patient will be independent in home exercise program to improve strength/mobility for better functional independence with ADLs.    Baseline 12/14: patient is not consistent with her HEP     Time 2    Period Weeks    Status On-going    Target Date 11/04/20             PT Long Term Goals - 10/21/20 1034      PT LONG TERM GOAL #1   Title Patient (< 58 years old) will complete five times sit to stand test in < 10 seconds indicating an increased LE strength and improved balance.    Baseline 09/30/20= 19.45 sec. 12/14: 16 sec    Time 4    Period Weeks    Status On-going    Target Date 11/04/20      PT LONG TERM GOAL #2   Title Patient will increase Berg Balance score by > 6 points to demonstrate decreased fall risk during functional activities.    Baseline  09/30/20: 41/56, 10/21/20: 46/56    Time 4    Period Weeks    Status Partially Met    Target Date 11/04/20      PT LONG TERM GOAL #3   Title Patient will increase six minute walk test distance to >1200 for progression to community ambulator and improve gait ability    Baseline 12/14: 705 ft    Time 4    Period Weeks    Status On-going    Target Date 11/04/20      PT LONG TERM GOAL #4   Title Patient will increase 10 meter walk test to >1.72ms as to improve gait speed for better community ambulation and to reduce fall risk.    Baseline 09/30/20=.84; 12/14: 0.88    Time 4    Period Weeks    Status New    Target Date 11/04/20      PT LONG TERM GOAL #5   Title Patient will reduce timed up and go to <11 seconds to reduce fall risk and demonstrate improved transfer/gait ability.    Baseline 11/23: 20.02 sec, 12/14: 12.7 sec    Time 4    Period Weeks    Status On-going    Target Date 11/04/20                 Plan - 10/30/20 1311    Clinical Impression Statement Therapist provided visual, tactile, and verbal cues for LSVT BIG program today with good demonstration followed by pateint. Patient was challenged by completing exercises 5-7 independently for the second set to be more independent with exercises. Therapist provided only verbal cues for proper positioning of patient's BLE and UE when  completing exercise with fair demonstration. Patient states she feels like "once she starts she's good." Therapy will continue to focus on gaining more independence with LSVT program as she prepares for discharge next week.    Personal Factors and Comorbidities Age;Past/Current Experience    Stability/Clinical Decision Making Stable/Uncomplicated    Rehab Potential Good    PT Frequency 4x / week    PT Duration 4 weeks    PT Treatment/Interventions Functional mobility training;Therapeutic activities;Neuromuscular re-education;Balance training;Therapeutic exercise;Gait training;Patient/family education;Manual techniques    PT Next Visit Plan LSVT BIG    PT Home Exercise Plan LSVT BIG next session    Consulted and Agree with Plan of Care Patient           Patient will benefit from skilled therapeutic intervention in order to improve the following deficits and impairments:  Abnormal gait,Decreased activity tolerance,Decreased endurance,Decreased strength,Decreased mobility,Decreased balance,Decreased coordination,Difficulty walking  Visit Diagnosis: Difficulty in walking, not elsewhere classified  Muscle weakness (generalized)     Problem List Patient Active Problem List   Diagnosis Date Noted  . Chronic venous insufficiency 02/09/2020  . Lymphedema 02/09/2020  . Cardiac pacemaker 10/01/2019  . Syncope 09/21/2019  . Diabetes mellitus, controlled ([place] 09/21/2019  . Complete heart block ([place] 09/18/2019  . Sepsis ([place] 08/08/2019  . UTI (urinary tract infection) 08/08/2019  . Hypothyroidism 08/08/2019  . HTN (hypertension) 08/08/2019  . Diabetes ([place] 08/08/2019  . Protein C deficiency ([place] 08/08/2019  . Protein S deficiency ([place] 08/08/2019  . Left leg pain 08/06/2019  . Hip fracture ([place] 01/11/2019  . Lumbar radiculopathy 09/04/2018  . Abnormal EKG 08/14/2018  . Pre-op evaluation 08/14/2018  . Anticoagulant long-term use 01/24/2018  . Poorly-controlled hypertension  01/24/2018  . Osteoporosis with current pathological fracture 09/16/2017  . Post-menopausal osteoporosis 09/16/2017  . CKD (  chronic kidney disease) stage 2, GFR 60-89 ml/min 07/17/2017  . Closed compression fracture of L1 vertebra ([place]) 10/22/2016  . Closed fracture of left scapula 10/22/2016  . Closed fracture of sacrum ([place]) 10/22/2016  . Closed fracture of sternum 10/22/2016  . Multiple rib fractures 10/22/2016  . Trimalleolar fracture of ankle, closed, left, with routine healing, subsequent encounter 03/11/2015  . Deep vein thrombosis (DVT) of right lower extremity ([place]) 11/19/2014  . Irritable bowel syndrome with diarrhea 11/19/2014  . Closed fracture of phalanx of foot 05/15/2013  . Knee injury 01/11/2012  . Osteopenia 09/18/2011  . Hypercalcemia 04/12/2011  . Acute thromboembolism of deep veins of lower extremity ([place]) 02/20/2010  . Diabetic nephropathy ([place]) 02/20/2010  . Mixed hyperlipidemia 02/20/2010  . Vitamin D deficiency 02/20/2010   Sierra [name] PT, DPT Netta Corrigan 10/30/2020, 1:15 PM  [place] MAIN [facility] SERVICES [address] [place], [place], [address] Phone: [phone]   Fax:  [phone]  Name: Younique Casad MRN: [id number] Date of Birth: [birth year]

## 2020-11-03 ENCOUNTER — Ambulatory Visit: Payer: Medicare Other | Admitting: Physical Therapy

## 2020-11-03 ENCOUNTER — Other Ambulatory Visit: Payer: Self-pay

## 2020-11-03 DIAGNOSIS — M6281 Muscle weakness (generalized): Secondary | ICD-10-CM

## 2020-11-03 DIAGNOSIS — R262 Difficulty in walking, not elsewhere classified: Secondary | ICD-10-CM

## 2020-11-03 DIAGNOSIS — N9489 Other specified conditions associated with female genital organs and menstrual cycle: Principal | ICD-10-CM

## 2020-11-03 NOTE — Therapy (Signed)
[place] MAIN [facility] SERVICES [address] [place], [place], [address] Phone: [phone]   Fax:  [phone]  Physical Therapy Discharge Summary  Patient Details  Name: Sierra [name] MRN: [id number] Date of Birth: [birth year] Referring Provider (PT): Jennings Books   Encounter Date: 11/03/2020   PT End of Session - 11/03/20 1127    Visit Number 16    Number of Visits 17    Date for PT Re-Evaluation 11/07/20    PT Start Time 9292    PT Stop Time 1105    PT Time Calculation (min) 50 min    Equipment Utilized During Treatment Gait belt    Activity Tolerance Patient tolerated treatment well    Behavior During Therapy [facility] for tasks assessed/performed           Past Medical History:  Diagnosis Date  . Anginal pain ([place])   . Anxiety   . Diabetes mellitus without complication ([place])   . Hypertension   . Hypothyroidism   . Protein C deficiency ([place])   . Protein S deficiency [facility])     Past Surgical History:  Procedure Laterality Date  . BACK SURGERY    . BREAST EXCISIONAL BIOPSY Left 2003?   benign  . COLONOSCOPY N/A 09/12/2017   Procedure: COLONOSCOPY;  Surgeon: Lollie Sails, MD;  Location: [facility] ENDOSCOPY;  Service: Endoscopy;  Laterality: N/A;  . COLONOSCOPY WITH PROPOFOL N/A 01/20/2016   Procedure: COLONOSCOPY WITH PROPOFOL;  Surgeon: Lollie Sails, MD;  Location: [facility] ENDOSCOPY;  Service: Endoscopy;  Laterality: N/A;  . FRACTURE SURGERY    . HIP ARTHROPLASTY Left 01/12/2019   Procedure: ARTHROPLASTY BIPOLAR HIP (HEMIARTHROPLASTY), LEFT;  Surgeon: Leim Fabry, MD;  Location: [facility] ORS;  Service: Orthopedics;  Laterality: Left;  . PACEMAKER LEADLESS INSERTION N/A 09/20/2019   Procedure: PACEMAKER LEADLESS INSERTION;  Surgeon: Isaias Cowman, MD;  Location: [place] CV LAB;  Service: Cardiovascular;  Laterality: N/A;  . PERIPHERAL VASCULAR CATHETERIZATION N/A 05/13/2015   Procedure: IVC Filter Removal;  Surgeon: Katha Cabal, MD;  Location: [place] CV LAB;  Service: Cardiovascular;  Laterality: N/A;  . TEMPORARY PACEMAKER Right 09/20/2019   Procedure: TEMPORARY PACEMAKER;  Surgeon: Isaias Cowman, MD;  Location: [place] CV LAB;  Service: Cardiovascular;  Laterality: Right;  . TONSILLECTOMY      There were no vitals filed for this visit.   Subjective Assessment - 11/03/20 1125    Subjective Patient reports she "overdid exercises" over the weekend and she has increased pain in her left chest. She states she was unable to move yesterday and it felt like it was a heart attack. She states it has resolved now but continues to have slight discomfort. She states she will be going to her PCP for follow up for chest pain and left hip pain.    Pertinent History Patient lives [facility] and has PMHx of :Tremor bilateral hands, imbalance , Status post Micra AV 09/20/2019 for complete heart block,  Lumbar degenerative disc disease ,  Protein C and S deficiency- on warfarin, Insomnia-    Limitations Lifting;Standing;Walking;House hold activities;Other (comment)    How long can you sit comfortably? back pain is a limitation    How long can you stand comfortably? requires holding onto RW    How long can you walk comfortably? limited by stability and pain    Patient Stated Goals to get better balance and walk without a walker again    Currently in  Pain? Yes    Pain Score 3     Pain Location Hip    Pain Orientation Left    Pain Descriptors / Indicators Aching    Pain Onset More than a month ago              [facility] PT Assessment - 11/03/20 0001      Assessment   Medical Diagnosis parkinsons    Referring Provider (PT) shah, Hemang    Onset Date/Surgical Date 06/18/20    Hand Dominance Right      Precautions   Precautions Fall      Restrictions   Weight Bearing Restrictions No      Balance Screen   Has the patient fallen in the past 6 months Yes    How many times?  10    Has the patient had a decrease in activity level because of a fear of falling?  Yes    Is the patient reluctant to leave their home because of a fear of falling?  No      Home Ecologist residence    Living Arrangements Spouse/significant other    Available Help at Discharge Family    Type of [place] Access Level entry    [place] One level    [place] - 2 wheels    Additional Comments has a loft with 14 steps      Prior Function   Level of Independence Independent with household mobility with device    Vocation Retired      Associate Professor   Overall Cognitive Status Within Functional Limits for tasks assessed      6 Minute Walk- Baseline   6 Minute Walk- Baseline yes    BP (mmHg) 146/86    HR (bpm) 94    02 Sat (%RA) 99 %      6 Minute walk- Post Test   6 Minute Walk Post Test yes    BP (mmHg) 162/95    HR (bpm) 117    02 Sat (%RA) 96 %    Perceived Rate of Exertion (Borg) 13- Somewhat hard      6 minute walk test results    Aerobic Endurance Distance Walked 900      Standardized Balance Assessment   Standardized Balance Assessment Berg Balance Test;Timed Up and Go Test;10 meter walk test;Five Times Sit to Stand    Five times sit to stand comments  11.62s    10 Meter Walk 1.12 m/s      Berg Balance Test   Sit to Stand Able to stand without using hands and stabilize independently    Standing Unsupported Able to stand safely 2 minutes    Sitting with Back Unsupported but Feet Supported on Floor or Stool Able to sit safely and securely 2 minutes    Stand to Sit Sits safely with minimal use of hands    Transfers Able to transfer safely, minor use of hands    Standing Unsupported with Eyes Closed Able to stand 10 seconds safely    Standing Unsupported with Feet Together Able to place feet together independently and stand 1 minute safely    From Standing, Reach Forward with Outstretched Arm Can reach forward >12 cm  safely (5")    From Standing Position, Pick up Object from Floor Able to pick up shoe safely and easily    From Standing Position, Turn to Look Behind Over each Shoulder Looks  behind from both sides and weight shifts well    Turn 360 Degrees Able to turn 360 degrees safely in 4 seconds or less    Standing Unsupported, Alternately Place Feet on Step/Stool Able to stand independently and complete 8 steps >20 seconds    Standing Unsupported, One Foot in Front Able to take small step independently and hold 30 seconds    Standing on One Leg Unable to try or needs assist to prevent fall    Total Score 48      Timed Up and Go Test   Normal TUG (seconds) 11            Patient has reached her maximum rehab potential at this time with physical therapy as evidenced by achieving or partially meeting her short term and long term goals. Patient has significantly improved her balance skills with her Merrilee Jansky Balance score from 41/56 to 48/56, putting patient at a decreased fall risk. Patient has increased her score of 10 meter walk test with 1.12 m/s to improve better community ambulation and to reduce fall risk. Patient demonstrates increased BLE strength as evidenced by decreasing time to complete five sit to stands with the score decreasing from 19.45 seconds to 11.62 seconds.  Educated patient/caregiver in importance of adherence with LSVT program. She has met most goals and has successfully completed the LSVT program. Patient is highly motivated to maintain progress and apply her outcomes from therapy to her every day routine. Patient was encouraged to rejoin therapy if patient's quality of life or general health condition worsens.            PT Education - 11/03/20 1127    Education Details LSVT exercise technique    Person(s) Educated Patient    Methods Explanation;Demonstration;Handout    Comprehension Verbalized understanding;Returned demonstration            PT Short Term Goals - 11/03/20  1128      PT SHORT TERM GOAL #1   Title Patient will be independent in home exercise program to improve strength/mobility for better functional independence with ADLs.    Baseline 12/14: patient is not consistent with her HEP    Time 2    Period Weeks    Status Achieved    Target Date 11/04/20             PT Long Term Goals - 11/03/20 1021      PT LONG TERM GOAL #1   Title Patient (< 33 years old) will complete five times sit to stand test in < 10 seconds indicating an increased LE strength and improved balance.    Baseline 09/30/20= 19.45 sec. 12/14: 16 sec, 12/27: 11.62s    Time 4    Period Weeks    Status Partially Met      PT LONG TERM GOAL #2   Title Patient will increase Berg Balance score by > 6 points to demonstrate decreased fall risk during functional activities.    Baseline 09/30/20: 41/56, 10/21/20: 46/56, 12/27: 48/56    Time 4    Period Weeks    Status Achieved      PT LONG TERM GOAL #3   Title Patient will increase six minute walk test distance to >1200 for progression to community ambulator and improve gait ability    Baseline 12/14: 705 ft, 12/27: 900 ft    Time 4    Period Weeks    Status Partially Met      PT LONG TERM GOAL #  4   Title Patient will increase 10 meter walk test to >1.75ms as to improve gait speed for better community ambulation and to reduce fall risk.    Baseline 09/30/20=.84; 12/14: 0.88, 12/27: 1.12 m/s    Time 4    Period Weeks    Status Achieved      PT LONG TERM GOAL #5   Title Patient will reduce timed up and go to <11 seconds to reduce fall risk and demonstrate improved transfer/gait ability.    Baseline 11/23: 20.02 sec, 12/14: 12.7 sec, 12/27: 11 sec    Time 4    Period Weeks    Status Partially Met                 Plan - 11/03/20 1145    Clinical Impression Statement Patient has reached her maximum rehab potential at this time with physical therapy as evidenced by achieving or partially meeting her short term  and long term goals. Patient has significantly improved her balance skills with her BMerrilee JanskyBalance score from 41/56 to 48/56, putting patient at a decreased fall risk. Patient has increased her score of 10 meter walk test with 1.12 m/s to improve better community ambulation and to reduce fall risk. Patient demonstrates increased BLE strength as evidenced by decreasing time to complete five sit to stands with the score decreasing from 19.45 seconds to 11.62 seconds.  Educated patient/caregiver in importance of adherence with LSVT program. She has met most goals and has successfully completed the LSVT program. Patient is highly motivated to maintain progress and apply her outcomes from therapy to her every day routine. Patient was encouraged to rejoin therapy if patient's quality of life or general health condition worsens.    Personal Factors and Comorbidities Age;Past/Current Experience    Stability/Clinical Decision Making Stable/Uncomplicated    Rehab Potential Good    PT Frequency 4x / week    PT Duration 4 weeks    PT Treatment/Interventions Functional mobility training;Therapeutic activities;Neuromuscular re-education;Balance training;Therapeutic exercise;Gait training;Patient/family education;Manual techniques    PT Next Visit Plan LSVT BIG    PT Home Exercise Plan LSVT BIG next session    Consulted and Agree with Plan of Care Patient           Patient will benefit from skilled therapeutic intervention in order to improve the following deficits and impairments:  Abnormal gait,Decreased activity tolerance,Decreased endurance,Decreased strength,Decreased mobility,Decreased balance,Decreased coordination,Difficulty walking  Visit Diagnosis: Difficulty in walking, not elsewhere classified  Muscle weakness (generalized)     Problem List Patient Active Problem List   Diagnosis Date Noted  . Chronic venous insufficiency 02/09/2020  . Lymphedema 02/09/2020  . Cardiac pacemaker 10/01/2019   . Syncope 09/21/2019  . Diabetes mellitus, controlled ([place] 09/21/2019  . Complete heart block ([place] 09/18/2019  . Sepsis ([place] 08/08/2019  . UTI (urinary tract infection) 08/08/2019  . Hypothyroidism 08/08/2019  . HTN (hypertension) 08/08/2019  . Diabetes ([place] 08/08/2019  . Protein C deficiency ([place] 08/08/2019  . Protein S deficiency ([place] 08/08/2019  . Left leg pain 08/06/2019  . Hip fracture ([place] 01/11/2019  . Lumbar radiculopathy 09/04/2018  . Abnormal EKG 08/14/2018  . Pre-op evaluation 08/14/2018  . Anticoagulant long-term use 01/24/2018  . Poorly-controlled hypertension 01/24/2018  . Osteoporosis with current pathological fracture 09/16/2017  . Post-menopausal osteoporosis 09/16/2017  . CKD (chronic kidney disease) stage 2, GFR 60-89 ml/min 07/17/2017  . Closed compression fracture of L1 vertebra ([place] 10/22/2016  . Closed fracture of left scapula 10/22/2016  .  Closed fracture of sacrum ([place]) 10/22/2016  . Closed fracture of sternum 10/22/2016  . Multiple rib fractures 10/22/2016  . Trimalleolar fracture of ankle, closed, left, with routine healing, subsequent encounter 03/11/2015  . Deep vein thrombosis (DVT) of right lower extremity ([place]) 11/19/2014  . Irritable bowel syndrome with diarrhea 11/19/2014  . Closed fracture of phalanx of foot 05/15/2013  . Knee injury 01/11/2012  . Osteopenia 09/18/2011  . Hypercalcemia 04/12/2011  . Acute thromboembolism of deep veins of lower extremity ([place]) 02/20/2010  . Diabetic nephropathy ([place]) 02/20/2010  . Mixed hyperlipidemia 02/20/2010  . Vitamin D deficiency 02/20/2010   Karl Luke PT, DPT Netta Corrigan 11/03/2020, 11:45 AM  [place] MAIN [facility] SERVICES [address] [place], [place], [address] Phone: [phone]   Fax:  [phone]  Name: Sierra [name] MRN: [id number] Date of Birth: [birth year]

## 2020-11-17 DIAGNOSIS — E039 Hypothyroidism, unspecified: Principal | ICD-10-CM

## 2020-11-17 DIAGNOSIS — F32A Depression, unspecified: Principal | ICD-10-CM

## 2020-11-17 DIAGNOSIS — N838 Other noninflammatory disorders of ovary, fallopian tube and broad ligament: Principal | ICD-10-CM

## 2020-11-17 DIAGNOSIS — Z20822 Contact with and (suspected) exposure to covid-19: Principal | ICD-10-CM

## 2020-11-17 DIAGNOSIS — Z950 Presence of cardiac pacemaker: Principal | ICD-10-CM

## 2020-11-17 DIAGNOSIS — J9811 Atelectasis: Principal | ICD-10-CM

## 2020-11-17 DIAGNOSIS — R410 Disorientation, unspecified: Principal | ICD-10-CM

## 2020-11-17 DIAGNOSIS — D6859 Other primary thrombophilia: Principal | ICD-10-CM

## 2020-11-17 DIAGNOSIS — Z7984 Long term (current) use of oral hypoglycemic drugs: Principal | ICD-10-CM

## 2020-11-17 DIAGNOSIS — Z7983 Long term (current) use of bisphosphonates: Principal | ICD-10-CM

## 2020-11-17 DIAGNOSIS — I129 Hypertensive chronic kidney disease with stage 1 through stage 4 chronic kidney disease, or unspecified chronic kidney disease: Principal | ICD-10-CM

## 2020-11-17 DIAGNOSIS — Z86718 Personal history of other venous thrombosis and embolism: Principal | ICD-10-CM

## 2020-11-17 DIAGNOSIS — C561 Malignant neoplasm of right ovary: Principal | ICD-10-CM

## 2020-11-17 DIAGNOSIS — I442 Atrioventricular block, complete: Principal | ICD-10-CM

## 2020-11-17 DIAGNOSIS — E1121 Type 2 diabetes mellitus with diabetic nephropathy: Principal | ICD-10-CM

## 2020-11-17 DIAGNOSIS — E785 Hyperlipidemia, unspecified: Principal | ICD-10-CM

## 2020-11-17 DIAGNOSIS — G20 Parkinson's disease: Principal | ICD-10-CM

## 2020-11-17 DIAGNOSIS — Z7901 Long term (current) use of anticoagulants: Principal | ICD-10-CM

## 2020-11-17 DIAGNOSIS — N183 Chronic kidney disease, stage 3 unspecified: Principal | ICD-10-CM

## 2020-11-17 DIAGNOSIS — R197 Diarrhea, unspecified: Principal | ICD-10-CM

## 2020-11-17 DIAGNOSIS — Z87891 Personal history of nicotine dependence: Principal | ICD-10-CM

## 2020-11-17 DIAGNOSIS — E1122 Type 2 diabetes mellitus with diabetic chronic kidney disease: Principal | ICD-10-CM

## 2020-11-19 ENCOUNTER — Ambulatory Visit: Admit: 2020-11-19 | Discharge: 2020-11-20 | Payer: MEDICARE

## 2020-11-19 DIAGNOSIS — D6859 Other primary thrombophilia: Principal | ICD-10-CM

## 2020-11-19 DIAGNOSIS — C785 Secondary malignant neoplasm of large intestine and rectum: Principal | ICD-10-CM

## 2020-11-19 DIAGNOSIS — Z950 Presence of cardiac pacemaker: Principal | ICD-10-CM

## 2020-11-19 DIAGNOSIS — E0829 Diabetes mellitus due to underlying condition with other diabetic kidney complication: Principal | ICD-10-CM

## 2020-11-19 DIAGNOSIS — C7961 Secondary malignant neoplasm of right ovary: Principal | ICD-10-CM

## 2020-11-19 DIAGNOSIS — C7889 Secondary malignant neoplasm of other digestive organs: Principal | ICD-10-CM

## 2020-11-19 DIAGNOSIS — N9489 Other specified conditions associated with female genital organs and menstrual cycle: Principal | ICD-10-CM

## 2020-11-20 ENCOUNTER — Ambulatory Visit: Admit: 2020-11-20 | Discharge: 2020-11-21 | Payer: MEDICARE

## 2020-11-20 DIAGNOSIS — R1900 Intra-abdominal and pelvic swelling, mass and lump, unspecified site: Principal | ICD-10-CM

## 2020-11-27 DIAGNOSIS — I82401 Acute embolism and thrombosis of unspecified deep veins of right lower extremity: Principal | ICD-10-CM

## 2020-12-01 ENCOUNTER — Ambulatory Visit: Admit: 2020-12-01 | Discharge: 2020-12-02 | Payer: MEDICARE

## 2020-12-01 DIAGNOSIS — Z86718 Personal history of other venous thrombosis and embolism: Principal | ICD-10-CM

## 2020-12-01 DIAGNOSIS — Z20822 Contact with and (suspected) exposure to covid-19: Principal | ICD-10-CM

## 2020-12-01 DIAGNOSIS — D6859 Other primary thrombophilia: Principal | ICD-10-CM

## 2020-12-01 DIAGNOSIS — Z950 Presence of cardiac pacemaker: Principal | ICD-10-CM

## 2020-12-01 DIAGNOSIS — E785 Hyperlipidemia, unspecified: Principal | ICD-10-CM

## 2020-12-01 DIAGNOSIS — E1122 Type 2 diabetes mellitus with diabetic chronic kidney disease: Principal | ICD-10-CM

## 2020-12-01 DIAGNOSIS — N183 Chronic kidney disease, stage 3 unspecified: Principal | ICD-10-CM

## 2020-12-01 DIAGNOSIS — F32A Depression, unspecified: Principal | ICD-10-CM

## 2020-12-01 DIAGNOSIS — Z7901 Long term (current) use of anticoagulants: Principal | ICD-10-CM

## 2020-12-01 DIAGNOSIS — I442 Atrioventricular block, complete: Principal | ICD-10-CM

## 2020-12-01 DIAGNOSIS — I10 Essential (primary) hypertension: Principal | ICD-10-CM

## 2020-12-01 DIAGNOSIS — Z7983 Long term (current) use of bisphosphonates: Principal | ICD-10-CM

## 2020-12-01 DIAGNOSIS — Z7984 Long term (current) use of oral hypoglycemic drugs: Principal | ICD-10-CM

## 2020-12-01 DIAGNOSIS — R410 Disorientation, unspecified: Principal | ICD-10-CM

## 2020-12-01 DIAGNOSIS — I129 Hypertensive chronic kidney disease with stage 1 through stage 4 chronic kidney disease, or unspecified chronic kidney disease: Principal | ICD-10-CM

## 2020-12-01 DIAGNOSIS — C561 Malignant neoplasm of right ovary: Principal | ICD-10-CM

## 2020-12-01 DIAGNOSIS — G20 Parkinson's disease: Principal | ICD-10-CM

## 2020-12-01 DIAGNOSIS — C7889 Secondary malignant neoplasm of other digestive organs: Principal | ICD-10-CM

## 2020-12-01 DIAGNOSIS — R197 Diarrhea, unspecified: Principal | ICD-10-CM

## 2020-12-01 DIAGNOSIS — Z87891 Personal history of nicotine dependence: Principal | ICD-10-CM

## 2020-12-01 DIAGNOSIS — J9811 Atelectasis: Principal | ICD-10-CM

## 2020-12-01 DIAGNOSIS — E1121 Type 2 diabetes mellitus with diabetic nephropathy: Principal | ICD-10-CM

## 2020-12-01 DIAGNOSIS — E039 Hypothyroidism, unspecified: Principal | ICD-10-CM

## 2020-12-02 DIAGNOSIS — Z86718 Personal history of other venous thrombosis and embolism: Principal | ICD-10-CM

## 2020-12-08 ENCOUNTER — Ambulatory Visit: Admit: 2020-12-08 | Discharge: 2020-12-08 | Payer: MEDICARE

## 2020-12-08 ENCOUNTER — Institutional Professional Consult (permissible substitution): Admit: 2020-12-08 | Discharge: 2020-12-08 | Payer: MEDICARE

## 2020-12-08 DIAGNOSIS — F32A Depression, unspecified: Principal | ICD-10-CM

## 2020-12-08 DIAGNOSIS — D6859 Other primary thrombophilia: Principal | ICD-10-CM

## 2020-12-08 DIAGNOSIS — Z87891 Personal history of nicotine dependence: Principal | ICD-10-CM

## 2020-12-08 DIAGNOSIS — I10 Essential (primary) hypertension: Principal | ICD-10-CM

## 2020-12-08 DIAGNOSIS — I442 Atrioventricular block, complete: Principal | ICD-10-CM

## 2020-12-08 DIAGNOSIS — N183 Chronic kidney disease, stage 3 unspecified: Principal | ICD-10-CM

## 2020-12-08 DIAGNOSIS — R1900 Intra-abdominal and pelvic swelling, mass and lump, unspecified site: Principal | ICD-10-CM

## 2020-12-08 DIAGNOSIS — R9431 Abnormal electrocardiogram [ECG] [EKG]: Principal | ICD-10-CM

## 2020-12-08 DIAGNOSIS — E039 Hypothyroidism, unspecified: Principal | ICD-10-CM

## 2020-12-08 DIAGNOSIS — E119 Type 2 diabetes mellitus without complications: Principal | ICD-10-CM

## 2020-12-08 DIAGNOSIS — Z950 Presence of cardiac pacemaker: Principal | ICD-10-CM

## 2020-12-08 DIAGNOSIS — Z01818 Encounter for other preprocedural examination: Principal | ICD-10-CM

## 2020-12-10 ENCOUNTER — Ambulatory Visit
Admit: 2020-12-10 | Discharge: 2020-12-11 | Payer: MEDICARE | Attending: Gynecologic Oncology | Primary: Gynecologic Oncology

## 2020-12-10 DIAGNOSIS — R1900 Intra-abdominal and pelvic swelling, mass and lump, unspecified site: Principal | ICD-10-CM

## 2020-12-10 DIAGNOSIS — Z950 Presence of cardiac pacemaker: Principal | ICD-10-CM

## 2020-12-11 DIAGNOSIS — N838 Other noninflammatory disorders of ovary, fallopian tube and broad ligament: Principal | ICD-10-CM

## 2020-12-12 DIAGNOSIS — R197 Diarrhea, unspecified: Principal | ICD-10-CM

## 2020-12-12 DIAGNOSIS — Z86718 Personal history of other venous thrombosis and embolism: Principal | ICD-10-CM

## 2020-12-12 DIAGNOSIS — E785 Hyperlipidemia, unspecified: Principal | ICD-10-CM

## 2020-12-12 DIAGNOSIS — Z20822 Contact with and (suspected) exposure to covid-19: Principal | ICD-10-CM

## 2020-12-12 DIAGNOSIS — N183 Chronic kidney disease, stage 3 unspecified: Principal | ICD-10-CM

## 2020-12-12 DIAGNOSIS — E1122 Type 2 diabetes mellitus with diabetic chronic kidney disease: Principal | ICD-10-CM

## 2020-12-12 DIAGNOSIS — Z950 Presence of cardiac pacemaker: Principal | ICD-10-CM

## 2020-12-12 DIAGNOSIS — Z7901 Long term (current) use of anticoagulants: Principal | ICD-10-CM

## 2020-12-12 DIAGNOSIS — Z7984 Long term (current) use of oral hypoglycemic drugs: Principal | ICD-10-CM

## 2020-12-12 DIAGNOSIS — C561 Malignant neoplasm of right ovary: Principal | ICD-10-CM

## 2020-12-12 DIAGNOSIS — Z87891 Personal history of nicotine dependence: Principal | ICD-10-CM

## 2020-12-12 DIAGNOSIS — E1121 Type 2 diabetes mellitus with diabetic nephropathy: Principal | ICD-10-CM

## 2020-12-12 DIAGNOSIS — R410 Disorientation, unspecified: Principal | ICD-10-CM

## 2020-12-12 DIAGNOSIS — J9811 Atelectasis: Principal | ICD-10-CM

## 2020-12-12 DIAGNOSIS — F32A Depression, unspecified: Principal | ICD-10-CM

## 2020-12-12 DIAGNOSIS — E039 Hypothyroidism, unspecified: Principal | ICD-10-CM

## 2020-12-12 DIAGNOSIS — I129 Hypertensive chronic kidney disease with stage 1 through stage 4 chronic kidney disease, or unspecified chronic kidney disease: Principal | ICD-10-CM

## 2020-12-12 DIAGNOSIS — D6859 Other primary thrombophilia: Principal | ICD-10-CM

## 2020-12-12 DIAGNOSIS — Z7983 Long term (current) use of bisphosphonates: Principal | ICD-10-CM

## 2020-12-12 DIAGNOSIS — I442 Atrioventricular block, complete: Principal | ICD-10-CM

## 2020-12-12 DIAGNOSIS — G20 Parkinson's disease: Principal | ICD-10-CM

## 2020-12-12 DIAGNOSIS — R1900 Intra-abdominal and pelvic swelling, mass and lump, unspecified site: Principal | ICD-10-CM

## 2020-12-15 ENCOUNTER — Encounter
Admit: 2020-12-15 | Discharge: 2020-12-21 | Disposition: A | Discharge status: Skilled Nursing Facility | Payer: MEDICARE | Attending: Anesthesiology

## 2020-12-15 ENCOUNTER — Ambulatory Visit
Admit: 2020-12-15 | Discharge: 2020-12-21 | Disposition: A | Discharge status: Skilled Nursing Facility | Payer: MEDICARE

## 2020-12-15 DIAGNOSIS — Z86718 Personal history of other venous thrombosis and embolism: Principal | ICD-10-CM

## 2020-12-15 DIAGNOSIS — I442 Atrioventricular block, complete: Principal | ICD-10-CM

## 2020-12-15 DIAGNOSIS — E1122 Type 2 diabetes mellitus with diabetic chronic kidney disease: Principal | ICD-10-CM

## 2020-12-15 DIAGNOSIS — R197 Diarrhea, unspecified: Principal | ICD-10-CM

## 2020-12-15 DIAGNOSIS — C561 Malignant neoplasm of right ovary: Principal | ICD-10-CM

## 2020-12-15 DIAGNOSIS — E785 Hyperlipidemia, unspecified: Principal | ICD-10-CM

## 2020-12-15 DIAGNOSIS — J9811 Atelectasis: Principal | ICD-10-CM

## 2020-12-15 DIAGNOSIS — E039 Hypothyroidism, unspecified: Principal | ICD-10-CM

## 2020-12-15 DIAGNOSIS — R410 Disorientation, unspecified: Principal | ICD-10-CM

## 2020-12-15 DIAGNOSIS — Z7984 Long term (current) use of oral hypoglycemic drugs: Principal | ICD-10-CM

## 2020-12-15 DIAGNOSIS — I129 Hypertensive chronic kidney disease with stage 1 through stage 4 chronic kidney disease, or unspecified chronic kidney disease: Principal | ICD-10-CM

## 2020-12-15 DIAGNOSIS — Z87891 Personal history of nicotine dependence: Principal | ICD-10-CM

## 2020-12-15 DIAGNOSIS — Z7901 Long term (current) use of anticoagulants: Principal | ICD-10-CM

## 2020-12-15 DIAGNOSIS — N183 Chronic kidney disease, stage 3 unspecified: Principal | ICD-10-CM

## 2020-12-15 DIAGNOSIS — Z20822 Contact with and (suspected) exposure to covid-19: Principal | ICD-10-CM

## 2020-12-15 DIAGNOSIS — Z7983 Long term (current) use of bisphosphonates: Principal | ICD-10-CM

## 2020-12-15 DIAGNOSIS — Z950 Presence of cardiac pacemaker: Principal | ICD-10-CM

## 2020-12-15 DIAGNOSIS — G20 Parkinson's disease: Principal | ICD-10-CM

## 2020-12-15 DIAGNOSIS — F32A Depression, unspecified: Principal | ICD-10-CM

## 2020-12-15 DIAGNOSIS — E1121 Type 2 diabetes mellitus with diabetic nephropathy: Principal | ICD-10-CM

## 2020-12-15 DIAGNOSIS — D6859 Other primary thrombophilia: Principal | ICD-10-CM

## 2020-12-16 DIAGNOSIS — Z87891 Personal history of nicotine dependence: Principal | ICD-10-CM

## 2020-12-16 DIAGNOSIS — G20 Parkinson's disease: Principal | ICD-10-CM

## 2020-12-16 DIAGNOSIS — E1121 Type 2 diabetes mellitus with diabetic nephropathy: Principal | ICD-10-CM

## 2020-12-16 DIAGNOSIS — Z950 Presence of cardiac pacemaker: Principal | ICD-10-CM

## 2020-12-16 DIAGNOSIS — J9811 Atelectasis: Principal | ICD-10-CM

## 2020-12-16 DIAGNOSIS — I442 Atrioventricular block, complete: Principal | ICD-10-CM

## 2020-12-16 DIAGNOSIS — R410 Disorientation, unspecified: Principal | ICD-10-CM

## 2020-12-16 DIAGNOSIS — F32A Depression, unspecified: Principal | ICD-10-CM

## 2020-12-16 DIAGNOSIS — D6859 Other primary thrombophilia: Principal | ICD-10-CM

## 2020-12-16 DIAGNOSIS — Z7983 Long term (current) use of bisphosphonates: Principal | ICD-10-CM

## 2020-12-16 DIAGNOSIS — R197 Diarrhea, unspecified: Principal | ICD-10-CM

## 2020-12-16 DIAGNOSIS — E039 Hypothyroidism, unspecified: Principal | ICD-10-CM

## 2020-12-16 DIAGNOSIS — N183 Chronic kidney disease, stage 3 unspecified: Principal | ICD-10-CM

## 2020-12-16 DIAGNOSIS — E785 Hyperlipidemia, unspecified: Principal | ICD-10-CM

## 2020-12-16 DIAGNOSIS — I129 Hypertensive chronic kidney disease with stage 1 through stage 4 chronic kidney disease, or unspecified chronic kidney disease: Principal | ICD-10-CM

## 2020-12-16 DIAGNOSIS — Z20822 Contact with and (suspected) exposure to covid-19: Principal | ICD-10-CM

## 2020-12-16 DIAGNOSIS — Z86718 Personal history of other venous thrombosis and embolism: Principal | ICD-10-CM

## 2020-12-16 DIAGNOSIS — C561 Malignant neoplasm of right ovary: Principal | ICD-10-CM

## 2020-12-16 DIAGNOSIS — Z7901 Long term (current) use of anticoagulants: Principal | ICD-10-CM

## 2020-12-16 DIAGNOSIS — Z7984 Long term (current) use of oral hypoglycemic drugs: Principal | ICD-10-CM

## 2020-12-16 DIAGNOSIS — E1122 Type 2 diabetes mellitus with diabetic chronic kidney disease: Principal | ICD-10-CM

## 2020-12-17 DIAGNOSIS — E1122 Type 2 diabetes mellitus with diabetic chronic kidney disease: Principal | ICD-10-CM

## 2020-12-17 DIAGNOSIS — R410 Disorientation, unspecified: Principal | ICD-10-CM

## 2020-12-17 DIAGNOSIS — J9811 Atelectasis: Principal | ICD-10-CM

## 2020-12-17 DIAGNOSIS — Z20822 Contact with and (suspected) exposure to covid-19: Principal | ICD-10-CM

## 2020-12-17 DIAGNOSIS — N183 Chronic kidney disease, stage 3 unspecified: Principal | ICD-10-CM

## 2020-12-17 DIAGNOSIS — I442 Atrioventricular block, complete: Principal | ICD-10-CM

## 2020-12-17 DIAGNOSIS — Z7901 Long term (current) use of anticoagulants: Principal | ICD-10-CM

## 2020-12-17 DIAGNOSIS — Z950 Presence of cardiac pacemaker: Principal | ICD-10-CM

## 2020-12-17 DIAGNOSIS — Z7983 Long term (current) use of bisphosphonates: Principal | ICD-10-CM

## 2020-12-17 DIAGNOSIS — E1121 Type 2 diabetes mellitus with diabetic nephropathy: Principal | ICD-10-CM

## 2020-12-17 DIAGNOSIS — F32A Depression, unspecified: Principal | ICD-10-CM

## 2020-12-17 DIAGNOSIS — Z7984 Long term (current) use of oral hypoglycemic drugs: Principal | ICD-10-CM

## 2020-12-17 DIAGNOSIS — E785 Hyperlipidemia, unspecified: Principal | ICD-10-CM

## 2020-12-17 DIAGNOSIS — Z87891 Personal history of nicotine dependence: Principal | ICD-10-CM

## 2020-12-17 DIAGNOSIS — I129 Hypertensive chronic kidney disease with stage 1 through stage 4 chronic kidney disease, or unspecified chronic kidney disease: Principal | ICD-10-CM

## 2020-12-17 DIAGNOSIS — E039 Hypothyroidism, unspecified: Principal | ICD-10-CM

## 2020-12-17 DIAGNOSIS — D6859 Other primary thrombophilia: Principal | ICD-10-CM

## 2020-12-17 DIAGNOSIS — Z86718 Personal history of other venous thrombosis and embolism: Principal | ICD-10-CM

## 2020-12-17 DIAGNOSIS — R197 Diarrhea, unspecified: Principal | ICD-10-CM

## 2020-12-17 DIAGNOSIS — G20 Parkinson's disease: Principal | ICD-10-CM

## 2020-12-17 DIAGNOSIS — C561 Malignant neoplasm of right ovary: Principal | ICD-10-CM

## 2020-12-17 MED ORDER — APIXABAN 2.5 MG TABLET
ORAL_TABLET | Freq: Two times a day (BID) | ORAL | 0 refills | 30 days
Start: 2020-12-17 — End: 2020-12-17

## 2020-12-19 DIAGNOSIS — R410 Disorientation, unspecified: Secondary | ICD-10-CM

## 2020-12-19 MED ORDER — THIAMINE MONONITRATE (VITAMIN B1) 100 MG TABLET
ORAL_TABLET | Freq: Every day | ORAL | 0 refills | 30.00000 days | Status: CP
Start: 2020-12-19 — End: ?

## 2020-12-19 MED ORDER — OXYCODONE 10 MG TABLET
ORAL_TABLET | ORAL | 0 refills | 2.00000 days | Status: CP | PRN
Start: 2020-12-19 — End: 2020-12-24

## 2020-12-19 MED ORDER — OXYCODONE 5 MG TABLET
ORAL_TABLET | ORAL | 0 refills | 2.00000 days | Status: CP | PRN
Start: 2020-12-19 — End: 2020-12-19

## 2020-12-19 MED ORDER — DOCUSATE SODIUM 100 MG CAPSULE
ORAL_CAPSULE | Freq: Two times a day (BID) | ORAL | 3 refills | 15.00000 days | Status: CP | PRN
Start: 2020-12-19 — End: 2021-01-18

## 2020-12-19 MED ORDER — APIXABAN 2.5 MG TABLET
ORAL_TABLET | Freq: Two times a day (BID) | ORAL | 3 refills | 30 days | Status: CP
Start: 2020-12-19 — End: ?

## 2020-12-19 MED ORDER — DULOXETINE 30 MG CAPSULE,DELAYED RELEASE
ORAL_CAPSULE | Freq: Every day | ORAL | 0 refills | 30.00000 days | Status: CP
Start: 2020-12-19 — End: 2021-01-18

## 2020-12-19 MED ORDER — ALENDRONATE 70 MG TABLET
ORAL_TABLET | ORAL | 3 refills | 84 days | Status: CP
Start: 2020-12-19 — End: 2021-12-19

## 2020-12-19 MED ORDER — SENNOSIDES 8.6 MG TABLET
ORAL_TABLET | Freq: Every evening | ORAL | 3 refills | 15.00000 days | Status: CP | PRN
Start: 2020-12-19 — End: 2021-01-18

## 2020-12-19 MED ORDER — RIVAROXABAN 10 MG TABLET
ORAL_TABLET | Freq: Every day | ORAL | 0 refills | 30.00000 days
Start: 2020-12-19 — End: 2020-12-19

## 2020-12-19 MED ORDER — ACETAMINOPHEN 325 MG TABLET
ORAL_TABLET | Freq: Four times a day (QID) | ORAL | 2 refills | 4.00000 days | Status: CP
Start: 2020-12-19 — End: ?

## 2020-12-20 MED ORDER — ATENOLOL 50 MG TABLET
ORAL_TABLET | Freq: Every day | ORAL | 3 refills | 30 days | Status: CP
Start: 2020-12-20 — End: 2021-01-19

## 2020-12-23 DIAGNOSIS — I10 Essential (primary) hypertension: Secondary | ICD-10-CM

## 2020-12-23 DIAGNOSIS — D6851 Activated protein C resistance: Secondary | ICD-10-CM

## 2020-12-23 DIAGNOSIS — F05 Delirium due to known physiological condition: Secondary | ICD-10-CM

## 2020-12-23 DIAGNOSIS — C569 Malignant neoplasm of unspecified ovary: Secondary | ICD-10-CM

## 2020-12-23 DIAGNOSIS — E1159 Type 2 diabetes mellitus with other circulatory complications: Secondary | ICD-10-CM

## 2020-12-23 DIAGNOSIS — F39 Unspecified mood [affective] disorder: Secondary | ICD-10-CM

## 2020-12-26 ENCOUNTER — Other Ambulatory Visit: Payer: Self-pay

## 2020-12-26 ENCOUNTER — Emergency Department
Admission: EM | Admit: 2020-12-26 | Discharge: 2020-12-26 | Disposition: A | Discharge status: Home / Self Care | Payer: Medicare Other | Attending: Emergency Medicine | Admitting: Emergency Medicine

## 2020-12-26 ENCOUNTER — Encounter: Payer: Self-pay | Admitting: Emergency Medicine

## 2020-12-26 ENCOUNTER — Emergency Department: Payer: Medicare Other

## 2020-12-26 DIAGNOSIS — W19XXXA Unspecified fall, initial encounter: Secondary | ICD-10-CM

## 2020-12-26 DIAGNOSIS — E1122 Type 2 diabetes mellitus with diabetic chronic kidney disease: Secondary | ICD-10-CM | POA: Diagnosis not present

## 2020-12-26 DIAGNOSIS — Z7901 Long term (current) use of anticoagulants: Secondary | ICD-10-CM | POA: Diagnosis not present

## 2020-12-26 DIAGNOSIS — M542 Cervicalgia: Secondary | ICD-10-CM | POA: Insufficient documentation

## 2020-12-26 DIAGNOSIS — E860 Dehydration: Secondary | ICD-10-CM | POA: Insufficient documentation

## 2020-12-26 DIAGNOSIS — Z950 Presence of cardiac pacemaker: Secondary | ICD-10-CM | POA: Insufficient documentation

## 2020-12-26 DIAGNOSIS — S0003XA Contusion of scalp, initial encounter: Secondary | ICD-10-CM | POA: Diagnosis not present

## 2020-12-26 DIAGNOSIS — Z79899 Other long term (current) drug therapy: Secondary | ICD-10-CM | POA: Insufficient documentation

## 2020-12-26 DIAGNOSIS — Z87891 Personal history of nicotine dependence: Secondary | ICD-10-CM | POA: Diagnosis not present

## 2020-12-26 DIAGNOSIS — Z96642 Presence of left artificial hip joint: Secondary | ICD-10-CM | POA: Diagnosis not present

## 2020-12-26 DIAGNOSIS — N182 Chronic kidney disease, stage 2 (mild): Secondary | ICD-10-CM | POA: Insufficient documentation

## 2020-12-26 DIAGNOSIS — I129 Hypertensive chronic kidney disease with stage 1 through stage 4 chronic kidney disease, or unspecified chronic kidney disease: Secondary | ICD-10-CM | POA: Diagnosis not present

## 2020-12-26 DIAGNOSIS — Z7984 Long term (current) use of oral hypoglycemic drugs: Secondary | ICD-10-CM | POA: Insufficient documentation

## 2020-12-26 DIAGNOSIS — W1839XA Other fall on same level, initial encounter: Secondary | ICD-10-CM | POA: Insufficient documentation

## 2020-12-26 DIAGNOSIS — E039 Hypothyroidism, unspecified: Secondary | ICD-10-CM | POA: Insufficient documentation

## 2020-12-26 DIAGNOSIS — E1140 Type 2 diabetes mellitus with diabetic neuropathy, unspecified: Secondary | ICD-10-CM | POA: Insufficient documentation

## 2020-12-26 DIAGNOSIS — S0990XA Unspecified injury of head, initial encounter: Secondary | ICD-10-CM | POA: Diagnosis present

## 2020-12-26 LAB — PROTIME-INR
INR: 1.2 (ref 0.8–1.2)
Prothrombin Time: 14.8 seconds (ref 11.4–15.2)

## 2020-12-26 LAB — BASIC METABOLIC PANEL
Anion gap: 11 (ref 5–15)
BUN: 53 mg/dL — ABNORMAL HIGH (ref 8–23)
CO2: 28 mmol/L (ref 22–32)
Calcium: 10.3 mg/dL (ref 8.9–10.3)
Chloride: 99 mmol/L (ref 98–111)
Creatinine, Ser: 2.6 mg/dL — ABNORMAL HIGH (ref 0.44–1.00)
GFR, Estimated: 18 mL/min — ABNORMAL LOW (ref >60)
Glucose, Bld: 124 mg/dL — ABNORMAL HIGH (ref 70–99)
Potassium: 2.7 mmol/L — CL (ref 3.5–5.1)
Sodium: 138 mmol/L (ref 135–145)

## 2020-12-26 LAB — CBC WITH DIFFERENTIAL/PLATELET
Abs Immature Granulocytes: 0.15 10*3/uL — ABNORMAL HIGH (ref 0.00–0.07)
Basophils Absolute: 0.1 10*3/uL (ref 0.0–0.1)
Basophils Relative: 1 %
Eosinophils Absolute: 0.4 10*3/uL (ref 0.0–0.5)
Eosinophils Relative: 3 %
HCT: 35.5 % — ABNORMAL LOW (ref 36.0–46.0)
Hemoglobin: 11.4 g/dL — ABNORMAL LOW (ref 12.0–15.0)
Immature Granulocytes: 1 %
Lymphocytes Relative: 7 %
Lymphs Abs: 0.9 10*3/uL (ref 0.7–4.0)
MCH: 28.3 pg (ref 26.0–34.0)
MCHC: 32.1 g/dL (ref 30.0–36.0)
MCV: 88.1 fL (ref 80.0–100.0)
Monocytes Absolute: 1 10*3/uL (ref 0.1–1.0)
Monocytes Relative: 8 %
Neutro Abs: 9.8 10*3/uL — ABNORMAL HIGH (ref 1.7–7.7)
Neutrophils Relative %: 80 %
Platelets: 243 10*3/uL (ref 150–400)
RBC: 4.03 MIL/uL (ref 3.87–5.11)
RDW: 15.9 % — ABNORMAL HIGH (ref 11.5–15.5)
WBC: 12.3 10*3/uL — ABNORMAL HIGH (ref 4.0–10.5)
nRBC: 0 % (ref 0.0–0.2)

## 2020-12-26 MED ORDER — SODIUM CHLORIDE 0.9 % IV BOLUS: 500.0000 mL | Freq: Once | INTRAVENOUS | Status: DC

## 2020-12-26 MED ORDER — POTASSIUM CHLORIDE 20 MEQ PO PACK
40.0000 meq | PACK | Freq: Once | ORAL | Status: AC
  Administered 2020-12-26: 40 meq via ORAL
  Filled 2020-12-26: qty 2

## 2020-12-26 NOTE — ED Notes (Signed)
EMS called for transport back to [facility]

## 2020-12-26 NOTE — Discharge Instructions (Addendum)
Your CT scan today was unremarkable.  Labs show signs of dehydration, and you should increase your water intake to stay better hydrated.  Your INR level today is 1.2, showing that your current Coumadin dosing is not adequately protecting you.  Please follow-up with your doctor for recheck of your labs and reassessment of your Coumadin regimen.

## 2020-12-26 NOTE — ED Provider Notes (Addendum)
[facility] Emergency Department Provider Note  ____________________________________________  Time seen: Approximately 12:32 PM  I have reviewed the triage vital signs and the nursing notes.   HISTORY  Chief Complaint Fall    HPI Jemeka Wagler is a 85 y.o. female with a history of hypertension and diabetes who was in her usual state of health, fell while getting breakfast, hitting the back of her head.  No loss of consciousness.  Does endorse some neck pain.  She is on Coumadin.  No paresthesias or motor weakness, no vision changes.  No vomiting.  Has pain in the back of the head which is constant nonradiating no aggravating alleviating factors mild to moderate intensity.      Past Medical History:  Diagnosis Date  . Anginal pain ([place])   . Anxiety   . Diabetes mellitus without complication ([place])   . Hypertension   . Hypothyroidism   . Protein C deficiency ([place])   . Protein S deficiency [facility])      Patient Active Problem List   Diagnosis Date Noted  . Chronic venous insufficiency 02/09/2020  . Lymphedema 02/09/2020  . Cardiac pacemaker 10/01/2019  . Syncope 09/21/2019  . Diabetes mellitus, controlled ([place]) 09/21/2019  . Complete heart block ([place]) 09/18/2019  . Sepsis ([place]) 08/08/2019  . UTI (urinary tract infection) 08/08/2019  . Hypothyroidism 08/08/2019  . HTN (hypertension) 08/08/2019  . Diabetes ([place]) 08/08/2019  . Protein C deficiency ([place]) 08/08/2019  . Protein S deficiency ([place]) 08/08/2019  . Left leg pain 08/06/2019  . Hip fracture ([place]) 01/11/2019  . Lumbar radiculopathy 09/04/2018  . Abnormal EKG 08/14/2018  . Pre-op evaluation 08/14/2018  . Anticoagulant long-term use 01/24/2018  . Poorly-controlled hypertension 01/24/2018  . Osteoporosis with current pathological fracture 09/16/2017  . Post-menopausal osteoporosis 09/16/2017  . CKD (chronic kidney disease) stage 2, GFR 60-89 ml/min 07/17/2017  . Closed compression fracture of  L1 vertebra ([place]) 10/22/2016  . Closed fracture of left scapula 10/22/2016  . Closed fracture of sacrum ([place]) 10/22/2016  . Closed fracture of sternum 10/22/2016  . Multiple rib fractures 10/22/2016  . Trimalleolar fracture of ankle, closed, left, with routine healing, subsequent encounter 03/11/2015  . Deep vein thrombosis (DVT) of right lower extremity ([place]) 11/19/2014  . Irritable bowel syndrome with diarrhea 11/19/2014  . Closed fracture of phalanx of foot 05/15/2013  . Knee injury 01/11/2012  . Osteopenia 09/18/2011  . Hypercalcemia 04/12/2011  . Acute thromboembolism of deep veins of lower extremity ([place]) 02/20/2010  . Diabetic nephropathy ([place]) 02/20/2010  . Mixed hyperlipidemia 02/20/2010  . Vitamin D deficiency 02/20/2010     Past Surgical History:  Procedure Laterality Date  . ABDOMINAL SURGERY    . BACK SURGERY    . BREAST EXCISIONAL BIOPSY Left 2003?   benign  . COLONOSCOPY N/A 09/12/2017   Procedure: COLONOSCOPY;  Surgeon: Lollie Sails, MD;  Location: [facility] ENDOSCOPY;  Service: Endoscopy;  Laterality: N/A;  . COLONOSCOPY WITH PROPOFOL N/A 01/20/2016   Procedure: COLONOSCOPY WITH PROPOFOL;  Surgeon: Lollie Sails, MD;  Location: [facility] ENDOSCOPY;  Service: Endoscopy;  Laterality: N/A;  . FRACTURE SURGERY    . HIP ARTHROPLASTY Left 01/12/2019   Procedure: ARTHROPLASTY BIPOLAR HIP (HEMIARTHROPLASTY), LEFT;  Surgeon: Leim Fabry, MD;  Location: [facility] ORS;  Service: Orthopedics;  Laterality: Left;  . PACEMAKER LEADLESS INSERTION N/A 09/20/2019   Procedure: PACEMAKER LEADLESS INSERTION;  Surgeon: Isaias Cowman, MD;  Location: [place] CV LAB;  Service: Cardiovascular;  Laterality: N/A;  . PERIPHERAL  VASCULAR CATHETERIZATION N/A 05/13/2015   Procedure: IVC Filter Removal;  Surgeon: Katha Cabal, MD;  Location: [place] CV LAB;  Service: Cardiovascular;  Laterality: N/A;  . TEMPORARY PACEMAKER Right 09/20/2019   Procedure: TEMPORARY PACEMAKER;   Surgeon: Isaias Cowman, MD;  Location: [place] CV LAB;  Service: Cardiovascular;  Laterality: Right;  . TONSILLECTOMY       Prior to Admission medications   Medication Sig Start Date End Date Taking? Authorizing Provider  alendronate (FOSAMAX) 70 MG tablet Take 70 mg by mouth every Sunday.   Yes [provider]  amitriptyline (ELAVIL) 10 MG tablet Take 10 mg by mouth at bedtime as needed for sleep. 12/08/18  Yes [provider]  APRISO 0.375 g 24 hr capsule Take 1.5 g by mouth daily at 12 noon.   Yes [provider]  atenolol (TENORMIN) 25 MG tablet Take 25 mg by mouth daily.   Yes [provider]  atorvastatin (LIPITOR) 40 MG tablet Take 40 mg by mouth daily.   Yes [provider]  calcium carbonate (OSCAL) 1500 (600 Ca) MG TABS tablet Take 600 mg of elemental calcium by mouth daily.    Yes [provider]  cholecalciferol (VITAMIN D3) 25 MCG (1000 UT) tablet Take 3,000 Units by mouth daily. (taken with calcium)   Yes [provider]  DULoxetine (CYMBALTA) 60 MG capsule Take 60 mg by mouth at bedtime.    Yes [provider]  furosemide (LASIX) 20 MG tablet Take 20 mg by mouth daily.   Yes [provider]  glipiZIDE (GLUCOTROL XL) 2.5 MG 24 hr tablet Take 2.5 mg by mouth daily. 11/11/18  Yes [provider]  levothyroxine (SYNTHROID, LEVOTHROID) 88 MCG tablet Take 88 mcg by mouth daily before breakfast.   Yes [provider]  pregabalin (LYRICA) 150 MG capsule Take 150 mg by mouth 2 (two) times daily.   Yes [provider]  Probiotic Product ([place]) CAPS Take 1 capsule by mouth daily.   Yes [provider]  saccharomyces boulardii (FLORASTOR) 250 MG capsule Take 250 mg by mouth daily.   Yes [provider]  sitaGLIPtin-metformin (JANUMET) 50-500 MG per tablet Take 2 tablets by mouth every evening.    Yes [provider]  vitamin B-12  (CYANOCOBALAMIN) 1000 MCG tablet Take 1,000 mcg by mouth daily.   Yes [provider]  warfarin (COUMADIN) 5 MG tablet Take 5 mg by mouth every evening.   Yes [provider]  amLODipine (NORVASC) 2.5 MG tablet Take 2.5 mg by mouth daily.    [provider]  lisinopril (PRINIVIL,ZESTRIL) 40 MG tablet Take 40 mg by mouth daily. 12/05/18   [provider]     Allergies Gentamicin and Morphine and related   Family History  Problem Relation Age of Onset  . Breast cancer Sister     Social History Social History   Tobacco Use  . Smoking status: Former Research scientist (life sciences)  . Smokeless tobacco: Never Used  Substance Use Topics  . Alcohol use: No  . Drug use: No    Review of Systems  Constitutional:   No fever or chills.  ENT:   No sore throat. No rhinorrhea. Cardiovascular:   No chest pain or syncope. Respiratory:   No dyspnea or cough. Gastrointestinal:   Negative for abdominal pain, vomiting and diarrhea.  Musculoskeletal:   Negative for focal pain or swelling All other systems reviewed and are negative except as documented above in ROS  and HPI.  ____________________________________________   PHYSICAL EXAM:  VITAL SIGNS: ED Triage Vitals  Enc Vitals Group     BP 12/26/20 1003 131/74     Pulse Rate 12/26/20 1000 61     Resp 12/26/20 1000 19     Temp 12/26/20 1000 98.2 F (36.8 C)     Temp Source 12/26/20 1000 Oral     SpO2 12/26/20 1000 97 %     Weight 12/26/20 1001 195 lb 1.7 oz (88.5 kg)     Height 12/26/20 1001 5\' 7"  (1.702 m)     Head Circumference --      Peak Flow --      Pain Score 12/26/20 1000 1     Pain Loc --      Pain Edu? --      Excl. in [place]? --     Vital signs reviewed, nursing assessments reviewed.   Constitutional:   Alert and oriented. Non-toxic appearance. Eyes:   Conjunctivae are normal. EOMI. PERRL. ENT      Head:   Normocephalic occipital scalp hematoma      Nose:   Wearing a mask.      Mouth/Throat:   Wearing a  mask.      Neck:   No meningismus. Full ROM.  Mild tenderness of superior cervical spine Hematological/Lymphatic/Immunilogical:   No cervical lymphadenopathy. Cardiovascular:   RRR. Symmetric bilateral radial and DP pulses.  No murmurs. Cap refill less than 2 seconds. Respiratory:   Normal respiratory effort without tachypnea/retractions. Breath sounds are clear and equal bilaterally. No wheezes/rales/rhonchi. Gastrointestinal:   Soft and nontender. Non distended. There is no CVA tenderness.  No rebound, rigidity, or guarding.  Musculoskeletal:   Normal range of motion in all extremities. No joint effusions.  No lower extremity tenderness.  No edema. Neurologic:   Normal speech and language.  Motor grossly intact. No acute focal neurologic deficits are appreciated.  Skin:    Skin is warm, dry and intact. No rash noted.  No petechiae, purpura, or bullae.  ____________________________________________    LABS (pertinent positives/negatives) (all labs ordered are listed, but only abnormal results are displayed) Labs Reviewed  BASIC METABOLIC PANEL - Abnormal; Notable for the following components:      Result Value   Potassium 2.7 (*)    Glucose, Bld 124 (*)    BUN 53 (*)    Creatinine, Ser 2.60 (*)    GFR, Estimated 18 (*)    All other components within normal limits  CBC WITH DIFFERENTIAL/PLATELET - Abnormal; Notable for the following components:   WBC 12.3 (*)    Hemoglobin 11.4 (*)    HCT 35.5 (*)    RDW 15.9 (*)    Neutro Abs 9.8 (*)    Abs Immature Granulocytes 0.15 (*)    All other components within normal limits  PROTIME-INR   ____________________________________________   EKG  Interpreted by me Sinus rhythm rate of 62, right axis, left bundle branch block.  No acute ischemic changes.  ____________________________________________    RADIOLOGY  CT Head Wo Contrast  Result Date: 12/26/2020 CLINICAL DATA:  Fall.  Struck back of head. EXAM: CT HEAD WITHOUT CONTRAST  TECHNIQUE: Contiguous axial images were obtained from the base of the skull through the vertex without intravenous contrast. COMPARISON:  06/15/2020. FINDINGS: Brain: There is no evidence for acute hemorrhage, hydrocephalus, mass lesion, or abnormal extra-axial fluid collection. No definite CT evidence for acute infarction. Diffuse loss of parenchymal volume is consistent with atrophy. Patchy  low attenuation in the deep hemispheric and periventricular white matter is nonspecific, but likely reflects chronic microvascular ischemic demyelination. Vascular: No hyperdense vessel or unexpected calcification. Skull: No evidence for fracture. No worrisome lytic or sclerotic lesion. Sinuses/Orbits: The visualized paranasal sinuses and mastoid air cells are clear. Visualized portions of the globes and intraorbital fat are unremarkable. Other: Left parietal scalp contusion evident. IMPRESSION: 1. No acute intracranial abnormality. 2. Atrophy with chronic small vessel white matter ischemic disease. Electronically Signed   By: Misty Stanley M.D.   On: 12/26/2020 10:32    ____________________________________________   PROCEDURES Procedures  ____________________________________________  DIFFERENTIAL DIAGNOSIS   Intracranial hemorrhage, C-spine fracture, supratherapeutic INR, dehydration, electrolyte abnormality  CLINICAL IMPRESSION / ASSESSMENT AND PLAN / ED COURSE  Medications ordered in the ED: Medications  potassium chloride (KLOR-CON) packet 40 mEq (40 mEq Oral Given 12/26/20 1116)    Pertinent labs & imaging results that were available during my care of the patient were reviewed by me and considered in my medical decision making (see chart for details).  Karem Farha was evaluated in Emergency Department on 12/26/2020 for the symptoms described in the history of present illness. She was evaluated in the context of the global COVID-19 pandemic, which necessitated consideration that the patient might be  at risk for infection with the SARS-CoV-2 virus that causes COVID-19. Institutional protocols and algorithms that pertain to the evaluation of patients at risk for COVID-19 are in a state of rapid change based on information released by regulatory bodies including the CDC and federal and state organizations. These policies and algorithms were followed during the patient's care in the ED.   Patient presents after mechanical fall.  Head trauma on Coumadin.  Vital signs are normal, mental status is normal, neuro exam is intact.  Labs show elevated creatinine above her baseline CKD, indicative of dehydration.  Patient agrees she does not drink enough water.  INR is 1.2, will need to follow-up with her doctor regarding Coumadin dosing.  CT head and neck unremarkable, no acute traumatic injuries.  Stable for discharge home.  Patient given replacement potassium for her K of 2.7.  She is tolerating p.o.      ____________________________________________   FINAL CLINICAL IMPRESSION(S) / ED DIAGNOSES    Final diagnoses:  Fall, initial encounter  Scalp hematoma, initial encounter  Dehydration     ED Discharge Orders    None      Portions of this note were generated with dragon dictation software. Dictation errors may occur despite best attempts at proofreading.   Carrie Mew, MD 12/26/20 [place]    Carrie Mew, MD 12/26/20 [phone]

## 2020-12-26 NOTE — ED Notes (Signed)
Patient transported to CT 

## 2020-12-26 NOTE — ED Triage Notes (Signed)
Pt to ED via [facility] from [facility] for a fall. Pt hit the back of her head. Pt takes coumadin. Per staff, pts knee buckled. Pt recent;y had abd surgery. Pt is A & O x 4. Per EMS pt has large hematoma on left back of her head.

## 2020-12-31 DIAGNOSIS — C561 Malignant neoplasm of right ovary: Secondary | ICD-10-CM | POA: Insufficient documentation

## 2021-01-06 ENCOUNTER — Ambulatory Visit: Admit: 2021-01-06 | Discharge: 2021-01-07 | Payer: MEDICARE

## 2021-01-06 DIAGNOSIS — C561 Malignant neoplasm of right ovary: Principal | ICD-10-CM

## 2021-01-07 DIAGNOSIS — C561 Malignant neoplasm of right ovary: Principal | ICD-10-CM

## 2021-01-12 ENCOUNTER — Ambulatory Visit
Admit: 2021-01-12 | Discharge: 2021-01-16 | Disposition: A | Discharge status: Skilled Nursing Facility | Payer: MEDICARE | Admitting: Gynecologic Oncology

## 2021-01-14 DIAGNOSIS — C561 Malignant neoplasm of right ovary: Principal | ICD-10-CM

## 2021-01-14 DIAGNOSIS — Z5111 Encounter for antineoplastic chemotherapy: Principal | ICD-10-CM

## 2021-01-15 DIAGNOSIS — Z5111 Encounter for antineoplastic chemotherapy: Secondary | ICD-10-CM | POA: Insufficient documentation

## 2021-01-16 MED ORDER — ATENOLOL 50 MG TABLET
ORAL_TABLET | Freq: Every day | ORAL | 3 refills | 30 days | Status: CP
Start: 2021-01-16 — End: 2021-02-15

## 2021-01-16 MED ORDER — APIXABAN 2.5 MG TABLET
ORAL_TABLET | Freq: Two times a day (BID) | ORAL | 3 refills | 30.00000 days | Status: CP
Start: 2021-01-16 — End: ?

## 2021-01-16 MED ORDER — ONDANSETRON 4 MG DISINTEGRATING TABLET
ORAL_TABLET | Freq: Three times a day (TID) | ORAL | 0 refills | 3.00000 days | Status: CP
Start: 2021-01-16 — End: 2021-02-06

## 2021-01-16 MED ORDER — ATORVASTATIN 40 MG TABLET
ORAL_TABLET | Freq: Every day | ORAL | 0 refills | 30.00000 days | Status: CP
Start: 2021-01-16 — End: 2021-02-15

## 2021-01-16 MED ORDER — THIAMINE HCL (VITAMIN B1) 100 MG TABLET
ORAL_TABLET | Freq: Every day | ORAL | 0 refills | 110 days | Status: CP
Start: 2021-01-16 — End: ?

## 2021-01-16 MED ORDER — ACETAMINOPHEN 325 MG TABLET
ORAL_TABLET | Freq: Four times a day (QID) | ORAL | 2 refills | 4.00000 days | Status: CP
Start: 2021-01-16 — End: ?

## 2021-01-19 ENCOUNTER — Ambulatory Visit: Admit: 2021-01-19 | Discharge: 2021-01-19 | Payer: MEDICARE

## 2021-01-19 DIAGNOSIS — Z5111 Encounter for antineoplastic chemotherapy: Principal | ICD-10-CM

## 2021-01-19 DIAGNOSIS — C561 Malignant neoplasm of right ovary: Principal | ICD-10-CM

## 2021-01-20 DIAGNOSIS — E1159 Type 2 diabetes mellitus with other circulatory complications: Secondary | ICD-10-CM

## 2021-01-20 DIAGNOSIS — F39 Unspecified mood [affective] disorder: Secondary | ICD-10-CM

## 2021-01-20 DIAGNOSIS — I10 Essential (primary) hypertension: Secondary | ICD-10-CM

## 2021-01-20 DIAGNOSIS — F05 Delirium due to known physiological condition: Secondary | ICD-10-CM

## 2021-01-20 DIAGNOSIS — R531 Weakness: Secondary | ICD-10-CM

## 2021-01-20 DIAGNOSIS — C569 Malignant neoplasm of unspecified ovary: Secondary | ICD-10-CM

## 2021-01-21 DIAGNOSIS — Z5111 Encounter for antineoplastic chemotherapy: Principal | ICD-10-CM

## 2021-01-22 DIAGNOSIS — C569 Malignant neoplasm of unspecified ovary: Principal | ICD-10-CM

## 2021-01-28 DIAGNOSIS — C561 Malignant neoplasm of right ovary: Principal | ICD-10-CM

## 2021-01-28 DIAGNOSIS — Z5111 Encounter for antineoplastic chemotherapy: Principal | ICD-10-CM

## 2021-02-02 DIAGNOSIS — C561 Malignant neoplasm of right ovary: Principal | ICD-10-CM

## 2021-02-09 ENCOUNTER — Ambulatory Visit: Admit: 2021-02-09 | Discharge: 2021-02-10 | Payer: MEDICARE

## 2021-02-09 ENCOUNTER — Other Ambulatory Visit: Admit: 2021-02-09 | Discharge: 2021-02-10 | Payer: MEDICARE

## 2021-02-09 DIAGNOSIS — C561 Malignant neoplasm of right ovary: Principal | ICD-10-CM

## 2021-02-10 ENCOUNTER — Ambulatory Visit: Admit: 2021-02-10 | Discharge: 2021-02-11 | Payer: MEDICARE

## 2021-02-10 ENCOUNTER — Other Ambulatory Visit: Admit: 2021-02-10 | Discharge: 2021-02-11 | Payer: MEDICARE

## 2021-02-10 DIAGNOSIS — C561 Malignant neoplasm of right ovary: Principal | ICD-10-CM

## 2021-02-10 DIAGNOSIS — C569 Malignant neoplasm of unspecified ovary: Principal | ICD-10-CM

## 2021-02-11 ENCOUNTER — Ambulatory Visit: Admit: 2021-02-11 | Discharge: 2021-02-12 | Payer: MEDICARE

## 2021-02-11 ENCOUNTER — Other Ambulatory Visit: Admit: 2021-02-11 | Discharge: 2021-02-12 | Payer: MEDICARE

## 2021-02-12 ENCOUNTER — Other Ambulatory Visit: Admit: 2021-02-12 | Discharge: 2021-02-13 | Payer: MEDICARE

## 2021-02-12 ENCOUNTER — Ambulatory Visit: Admit: 2021-02-12 | Discharge: 2021-02-13 | Payer: MEDICARE

## 2021-02-13 ENCOUNTER — Ambulatory Visit: Admit: 2021-02-13 | Discharge: 2021-02-14 | Payer: MEDICARE

## 2021-02-13 DIAGNOSIS — C561 Malignant neoplasm of right ovary: Principal | ICD-10-CM

## 2021-02-13 DIAGNOSIS — Z5111 Encounter for antineoplastic chemotherapy: Principal | ICD-10-CM

## 2021-02-16 DIAGNOSIS — R531 Weakness: Secondary | ICD-10-CM

## 2021-02-16 DIAGNOSIS — N1832 Chronic kidney disease, stage 3b: Secondary | ICD-10-CM

## 2021-02-16 DIAGNOSIS — C569 Malignant neoplasm of unspecified ovary: Secondary | ICD-10-CM

## 2021-02-16 DIAGNOSIS — F39 Unspecified mood [affective] disorder: Secondary | ICD-10-CM

## 2021-02-16 DIAGNOSIS — E1159 Type 2 diabetes mellitus with other circulatory complications: Secondary | ICD-10-CM

## 2021-02-16 DIAGNOSIS — I10 Essential (primary) hypertension: Secondary | ICD-10-CM

## 2021-02-17 DIAGNOSIS — N1832 Chronic kidney disease, stage 3b: Secondary | ICD-10-CM

## 2021-03-02 ENCOUNTER — Ambulatory Visit: Admit: 2021-03-02 | Discharge: 2021-03-03 | Payer: MEDICARE

## 2021-03-02 ENCOUNTER — Other Ambulatory Visit: Admit: 2021-03-02 | Discharge: 2021-03-03 | Payer: MEDICARE

## 2021-03-02 ENCOUNTER — Ambulatory Visit: Admit: 2021-03-02 | Discharge: 2021-03-03 | Payer: MEDICARE | Attending: Women's Health | Primary: Women's Health

## 2021-03-02 DIAGNOSIS — C561 Malignant neoplasm of right ovary: Principal | ICD-10-CM

## 2021-03-02 DIAGNOSIS — Z5111 Encounter for antineoplastic chemotherapy: Principal | ICD-10-CM

## 2021-03-03 ENCOUNTER — Ambulatory Visit: Admit: 2021-03-03 | Discharge: 2021-03-04 | Payer: MEDICARE

## 2021-03-03 ENCOUNTER — Other Ambulatory Visit: Admit: 2021-03-03 | Discharge: 2021-03-04 | Payer: MEDICARE

## 2021-03-03 DIAGNOSIS — C561 Malignant neoplasm of right ovary: Principal | ICD-10-CM

## 2021-03-03 DIAGNOSIS — Z5111 Encounter for antineoplastic chemotherapy: Principal | ICD-10-CM

## 2021-03-04 ENCOUNTER — Ambulatory Visit: Admit: 2021-03-04 | Discharge: 2021-03-05 | Payer: MEDICARE

## 2021-03-04 ENCOUNTER — Other Ambulatory Visit: Admit: 2021-03-04 | Discharge: 2021-03-05 | Payer: MEDICARE

## 2021-03-04 DIAGNOSIS — C561 Malignant neoplasm of right ovary: Principal | ICD-10-CM

## 2021-03-05 ENCOUNTER — Ambulatory Visit: Admit: 2021-03-05 | Discharge: 2021-03-06 | Payer: MEDICARE

## 2021-03-06 ENCOUNTER — Ambulatory Visit: Admit: 2021-03-06 | Discharge: 2021-03-07 | Payer: MEDICARE

## 2021-03-06 ENCOUNTER — Other Ambulatory Visit: Admit: 2021-03-06 | Discharge: 2021-03-07 | Payer: MEDICARE

## 2021-03-06 DIAGNOSIS — C561 Malignant neoplasm of right ovary: Principal | ICD-10-CM

## 2021-03-17 DIAGNOSIS — Z5111 Encounter for antineoplastic chemotherapy: Principal | ICD-10-CM

## 2021-03-17 DIAGNOSIS — C561 Malignant neoplasm of right ovary: Principal | ICD-10-CM

## 2021-03-20 DIAGNOSIS — F39 Unspecified mood [affective] disorder: Secondary | ICD-10-CM | POA: Diagnosis not present

## 2021-03-20 DIAGNOSIS — N183 Chronic kidney disease, stage 3 unspecified: Secondary | ICD-10-CM | POA: Diagnosis not present

## 2021-03-20 DIAGNOSIS — G620 Drug-induced polyneuropathy: Secondary | ICD-10-CM

## 2021-03-20 DIAGNOSIS — I10 Essential (primary) hypertension: Secondary | ICD-10-CM | POA: Diagnosis not present

## 2021-03-20 DIAGNOSIS — C569 Malignant neoplasm of unspecified ovary: Secondary | ICD-10-CM | POA: Diagnosis not present

## 2021-03-20 DIAGNOSIS — I442 Atrioventricular block, complete: Secondary | ICD-10-CM

## 2021-03-20 DIAGNOSIS — E1159 Type 2 diabetes mellitus with other circulatory complications: Secondary | ICD-10-CM

## 2021-03-20 DIAGNOSIS — M6281 Muscle weakness (generalized): Secondary | ICD-10-CM

## 2021-03-20 DIAGNOSIS — Z5111 Encounter for antineoplastic chemotherapy: Principal | ICD-10-CM

## 2021-03-20 DIAGNOSIS — C561 Malignant neoplasm of right ovary: Principal | ICD-10-CM

## 2021-03-20 DIAGNOSIS — C562 Malignant neoplasm of left ovary: Principal | ICD-10-CM

## 2021-03-27 ENCOUNTER — Ambulatory Visit: Admit: 2021-03-27 | Discharge: 2021-03-28 | Payer: MEDICARE

## 2021-03-27 DIAGNOSIS — C561 Malignant neoplasm of right ovary: Principal | ICD-10-CM

## 2021-03-30 DIAGNOSIS — C561 Malignant neoplasm of right ovary: Principal | ICD-10-CM

## 2021-04-02 ENCOUNTER — Ambulatory Visit: Admit: 2021-04-02 | Discharge: 2021-04-02 | Payer: MEDICARE | Attending: Internal Medicine | Primary: Internal Medicine

## 2021-04-02 ENCOUNTER — Ambulatory Visit: Admit: 2021-04-02 | Discharge: 2021-04-02 | Payer: MEDICARE

## 2021-04-03 DIAGNOSIS — C561 Malignant neoplasm of right ovary: Principal | ICD-10-CM

## 2021-04-07 ENCOUNTER — Ambulatory Visit: Admit: 2021-04-07 | Discharge: 2021-04-08 | Payer: MEDICARE

## 2021-04-07 DIAGNOSIS — C561 Malignant neoplasm of right ovary: Principal | ICD-10-CM

## 2021-04-09 ENCOUNTER — Ambulatory Visit: Admit: 2021-04-09 | Discharge: 2021-04-10 | Payer: MEDICARE | Attending: Internal Medicine | Primary: Internal Medicine

## 2021-04-09 DIAGNOSIS — D6859 Other primary thrombophilia: Principal | ICD-10-CM

## 2021-04-09 DIAGNOSIS — I82401 Acute embolism and thrombosis of unspecified deep veins of right lower extremity: Principal | ICD-10-CM

## 2021-04-09 DIAGNOSIS — C561 Malignant neoplasm of right ovary: Principal | ICD-10-CM

## 2021-04-22 DIAGNOSIS — N184 Chronic kidney disease, stage 4 (severe): Secondary | ICD-10-CM

## 2021-04-22 DIAGNOSIS — M25552 Pain in left hip: Secondary | ICD-10-CM

## 2021-04-22 DIAGNOSIS — Z96642 Presence of left artificial hip joint: Secondary | ICD-10-CM

## 2021-04-27 DIAGNOSIS — I10 Essential (primary) hypertension: Secondary | ICD-10-CM

## 2021-04-27 DIAGNOSIS — D6851 Activated protein C resistance: Secondary | ICD-10-CM

## 2021-04-27 DIAGNOSIS — E1121 Type 2 diabetes mellitus with diabetic nephropathy: Secondary | ICD-10-CM

## 2021-04-27 DIAGNOSIS — F39 Unspecified mood [affective] disorder: Secondary | ICD-10-CM

## 2021-04-27 DIAGNOSIS — C569 Malignant neoplasm of unspecified ovary: Secondary | ICD-10-CM

## 2021-04-27 DIAGNOSIS — N1832 Chronic kidney disease, stage 3b: Secondary | ICD-10-CM

## 2021-04-27 DIAGNOSIS — I442 Atrioventricular block, complete: Secondary | ICD-10-CM

## 2021-06-26 DIAGNOSIS — D6851 Activated protein C resistance: Secondary | ICD-10-CM

## 2021-06-26 DIAGNOSIS — I442 Atrioventricular block, complete: Secondary | ICD-10-CM

## 2021-06-26 DIAGNOSIS — M810 Age-related osteoporosis without current pathological fracture: Secondary | ICD-10-CM

## 2021-06-26 DIAGNOSIS — F39 Unspecified mood [affective] disorder: Secondary | ICD-10-CM

## 2021-06-26 DIAGNOSIS — C569 Malignant neoplasm of unspecified ovary: Secondary | ICD-10-CM

## 2021-06-26 DIAGNOSIS — K523 Indeterminate colitis: Secondary | ICD-10-CM

## 2021-06-26 DIAGNOSIS — E039 Hypothyroidism, unspecified: Secondary | ICD-10-CM

## 2021-06-26 DIAGNOSIS — N183 Chronic kidney disease, stage 3 unspecified: Secondary | ICD-10-CM

## 2021-06-26 DIAGNOSIS — I10 Essential (primary) hypertension: Secondary | ICD-10-CM

## 2021-06-26 DIAGNOSIS — I4891 Unspecified atrial fibrillation: Secondary | ICD-10-CM

## 2021-06-26 DIAGNOSIS — E1159 Type 2 diabetes mellitus with other circulatory complications: Secondary | ICD-10-CM

## 2021-07-01 ENCOUNTER — Ambulatory Visit: Admit: 2021-07-01 | Discharge: 2021-07-02 | Payer: MEDICARE

## 2021-07-02 DIAGNOSIS — M7522 Bicipital tendinitis, left shoulder: Secondary | ICD-10-CM

## 2021-07-07 ENCOUNTER — Ambulatory Visit: Admit: 2021-07-07 | Discharge: 2021-07-08 | Payer: MEDICARE

## 2021-07-07 DIAGNOSIS — C562 Malignant neoplasm of left ovary: Principal | ICD-10-CM

## 2021-07-07 DIAGNOSIS — C561 Malignant neoplasm of right ovary: Principal | ICD-10-CM

## 2021-07-07 DIAGNOSIS — Z5111 Encounter for antineoplastic chemotherapy: Principal | ICD-10-CM

## 2021-07-20 DIAGNOSIS — C561 Malignant neoplasm of right ovary: Principal | ICD-10-CM

## 2021-08-05 ENCOUNTER — Ambulatory Visit: Admit: 2021-08-05 | Discharge: 2021-08-06 | Payer: MEDICARE

## 2021-08-09 DIAGNOSIS — C561 Malignant neoplasm of right ovary: Principal | ICD-10-CM

## 2021-08-24 ENCOUNTER — Ambulatory Visit: Admit: 2021-08-24 | Discharge: 2021-08-25 | Payer: MEDICARE

## 2021-08-24 DIAGNOSIS — C561 Malignant neoplasm of right ovary: Principal | ICD-10-CM

## 2021-09-01 ENCOUNTER — Other Ambulatory Visit: Payer: Self-pay | Admitting: Internal Medicine

## 2021-09-01 DIAGNOSIS — Z1231 Encounter for screening mammogram for malignant neoplasm of breast: Secondary | ICD-10-CM

## 2021-09-03 ENCOUNTER — Ambulatory Visit: Admit: 2021-09-03 | Discharge: 2021-09-04 | Payer: MEDICARE | Attending: Internal Medicine | Primary: Internal Medicine

## 2021-09-07 DIAGNOSIS — I10 Essential (primary) hypertension: Secondary | ICD-10-CM | POA: Diagnosis not present

## 2021-09-07 DIAGNOSIS — N1832 Chronic kidney disease, stage 3b: Secondary | ICD-10-CM

## 2021-09-07 DIAGNOSIS — G620 Drug-induced polyneuropathy: Secondary | ICD-10-CM | POA: Diagnosis not present

## 2021-09-07 DIAGNOSIS — E1159 Type 2 diabetes mellitus with other circulatory complications: Secondary | ICD-10-CM | POA: Diagnosis not present

## 2021-09-07 DIAGNOSIS — C569 Malignant neoplasm of unspecified ovary: Secondary | ICD-10-CM | POA: Diagnosis not present

## 2021-09-07 DIAGNOSIS — D6851 Activated protein C resistance: Secondary | ICD-10-CM

## 2021-09-07 DIAGNOSIS — I442 Atrioventricular block, complete: Secondary | ICD-10-CM

## 2021-09-17 ENCOUNTER — Ambulatory Visit: Admit: 2021-09-17 | Discharge: 2021-09-17 | Payer: MEDICARE

## 2021-09-17 DIAGNOSIS — C561 Malignant neoplasm of right ovary: Principal | ICD-10-CM

## 2021-09-23 ENCOUNTER — Ambulatory Visit
Admission: RE | Admit: 2021-09-23 | Discharge: 2021-09-23 | Disposition: A | Discharge status: Home / Self Care | Payer: Medicare Other | Source: Ambulatory Visit | Attending: Internal Medicine | Admitting: Internal Medicine

## 2021-09-23 ENCOUNTER — Other Ambulatory Visit: Payer: Self-pay

## 2021-09-23 DIAGNOSIS — Z1231 Encounter for screening mammogram for malignant neoplasm of breast: Secondary | ICD-10-CM | POA: Diagnosis not present

## 2021-10-15 ENCOUNTER — Ambulatory Visit: Admit: 2021-10-15 | Discharge: 2021-10-16 | Payer: MEDICARE

## 2021-10-15 DIAGNOSIS — C569 Malignant neoplasm of unspecified ovary: Principal | ICD-10-CM

## 2021-10-15 DIAGNOSIS — C561 Malignant neoplasm of right ovary: Principal | ICD-10-CM

## 2021-10-19 DIAGNOSIS — C561 Malignant neoplasm of right ovary: Principal | ICD-10-CM

## 2021-10-20 DIAGNOSIS — C561 Malignant neoplasm of right ovary: Principal | ICD-10-CM

## 2021-10-23 DIAGNOSIS — M180 Bilateral primary osteoarthritis of first carpometacarpal joints: Secondary | ICD-10-CM

## 2021-10-23 DIAGNOSIS — G620 Drug-induced polyneuropathy: Secondary | ICD-10-CM

## 2021-10-23 DIAGNOSIS — E039 Hypothyroidism, unspecified: Secondary | ICD-10-CM

## 2021-10-23 DIAGNOSIS — I442 Atrioventricular block, complete: Secondary | ICD-10-CM

## 2021-10-23 DIAGNOSIS — C569 Malignant neoplasm of unspecified ovary: Secondary | ICD-10-CM

## 2021-10-23 DIAGNOSIS — D530 Protein deficiency anemia: Secondary | ICD-10-CM

## 2021-10-23 DIAGNOSIS — I872 Venous insufficiency (chronic) (peripheral): Secondary | ICD-10-CM

## 2021-10-23 DIAGNOSIS — F39 Unspecified mood [affective] disorder: Secondary | ICD-10-CM | POA: Diagnosis not present

## 2021-10-23 DIAGNOSIS — E1159 Type 2 diabetes mellitus with other circulatory complications: Secondary | ICD-10-CM | POA: Diagnosis not present

## 2021-10-23 DIAGNOSIS — I10 Essential (primary) hypertension: Secondary | ICD-10-CM | POA: Diagnosis not present

## 2021-10-23 DIAGNOSIS — N183 Chronic kidney disease, stage 3 unspecified: Secondary | ICD-10-CM | POA: Diagnosis not present

## 2021-11-15 DIAGNOSIS — C569 Malignant neoplasm of unspecified ovary: Principal | ICD-10-CM

## 2021-11-15 DIAGNOSIS — C561 Malignant neoplasm of right ovary: Principal | ICD-10-CM

## 2021-11-16 ENCOUNTER — Ambulatory Visit: Admit: 2021-11-16 | Discharge: 2021-11-17 | Payer: MEDICARE

## 2021-11-16 DIAGNOSIS — C561 Malignant neoplasm of right ovary: Principal | ICD-10-CM

## 2021-11-17 DIAGNOSIS — C569 Malignant neoplasm of unspecified ovary: Principal | ICD-10-CM

## 2021-11-17 DIAGNOSIS — C561 Malignant neoplasm of right ovary: Principal | ICD-10-CM

## 2021-11-17 MED ORDER — ETOPOSIDE 50 MG CAPSULE
ORAL_CAPSULE | Freq: Every day | ORAL | 5 refills | 14 days | Status: CP
Start: 2021-11-17 — End: 2022-11-17
  Filled 2021-12-03: qty 14, 28d supply, fill #0

## 2021-11-18 DIAGNOSIS — C561 Malignant neoplasm of right ovary: Principal | ICD-10-CM

## 2021-11-24 DIAGNOSIS — C561 Malignant neoplasm of right ovary: Principal | ICD-10-CM

## 2021-11-27 ENCOUNTER — Ambulatory Visit: Admit: 2021-11-27 | Discharge: 2021-11-28 | Payer: MEDICARE

## 2021-11-30 NOTE — Unmapped (Signed)
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  I am speaking to the patient's caregiver, [name].    Was a Nurse, learning disability used for this call? No    Verified patient's date of birth / HIPAA.    Specialty medication(s) to be sent: Hematology/Oncology: Etoposide      Non-specialty medications/supplies to be sent: n/a      Medications not needed at this time: n/a         Vepesid (etoposide)    Medication & Administration     Dosage: 50 mg by mouth once daily (at the same time each day) 14 days on 14 days off . medication comes in blister packs    Administration: This is an oral medication and is taken on an empty stomach (1 hour before or 2 hours after eating).  Swallow capsule whole and do not break, open, or chew the capsule.    Adherence/Missed dose instructions: If you miss a dose, do not take two doses to make up for a missed dose.  If you vomit after taking your dose, do not take another dose. Take the next dose at its normally scheduled time.      Goals of Therapy     Prevent disease progression    Side Effects & Monitoring Parameters   ??? Nausea  ??? Vomiting  ??? Diarrhea  ??? Fatigue  ??? Hair Loss/Thinning  ??? Infection precautions (neutropenia)  ??? Bleeding precautions (thrombocytopenia)  ??? Mouth sores  ??? Decreased appetite and changes in taste    The following side effects should be reported to the provider:  ??? Heartbeat that doesn't feel normal (heart feels like it's racing, skipping a beat or fluttering)  ??? Signs of infection (fever, chills, cough)  ??? Signs of allergic reaction (rash, hives, shortness of breath)  ??? Excessive bruising, gums bleeding or nose bleeds  ??? Mouth irritation or mouth sores      Contraindications, Warnings, & Precautions     ??? Mouth sores- discussed use of baking soda/salt rinses   ??? Exposure of an unborn child to this medication could cause birth defects, so you should not become pregnant or father a child while on this medication.  Effective birth control is necessary during treatment and for at least 6 months for women and 4 months for men after treatment.  ??? Radiation recall (a skin reaction that looks like a sunburn in areas where radiation was previously given (only a risk in patients who have had radiation)    Drug/Food Interactions     ??? Medication list reviewed in Epic. The patient was instructed to inform the care team before taking any new medications or supplements including over the counter medications, vitamins, and herbal supplements. No drug interactions identified.     Storage, Handling Precautions, & Disposal     ??? This medication should be stored in the refrigerator. Keep out of reach of others including children and pets. Keep the medicine in the original container with a child-proof top (no pillboxes).   ??? Do not throw away or flush unused mediation down the toilet or sink. This drug is considered hazardous and should be handled as little as possible.  If someone else helps with medication administration, they should wear gloves      Current Medications (including OTC/herbals), Comorbidities and Allergies  Current Outpatient Medications   Medication Sig Dispense Refill   ??? alendronate (FOSAMAX) 70 MG tablet Take 1 tablet (70 mg total) by mouth once a week. (Patient taking differently: Take 70 mg by mouth once a week. Every Friday) 12 tablet 3   ??? apixaban (ELIQUIS) 2.5 mg Tab Take 1 tablet (2.5 mg total) by mouth Two (2) times a day. 60 tablet 3   ??? atenoloL (TENORMIN) 50 MG tablet Take 1 tablet (50 mg total) by mouth daily. 30 tablet 3   ??? atorvastatin (LIPITOR) 40 MG tablet Take 1 tablet (40 mg total) by mouth daily. 30 tablet 0   ??? calcium-vitamin D 500 mg(1,250mg ) -200 unit per tablet Take 1 tablet by mouth 2 (two) times a day with meals.     ??? cholecalciferol, vitamin D3-50 mcg, 2,000 unit,, 50 mcg (2,000 unit) cap Take 50 mcg by mouth daily.      ??? DULoxetine (CYMBALTA) 30 MG capsule Take 1 capsule (30 mg total) by mouth daily. 30 capsule 0   ??? ergocalciferol-1,250 mcg, 50,000 unit, (DRISDOL) 1,250 mcg (50,000 unit) capsule Take 50,000 Units by mouth.     ??? etoposide (VEPESID) 50 mg capsule Take 1 capsule (50 mg total) by mouth daily for 2 weeks then OFF for 2 weeks. 14 capsule 5   ??? furosemide (LASIX) 20 MG tablet Take 20 mg by mouth daily as needed.     ??? glipiZIDE (GLUCOTROL XL) 2.5 MG 24 hr tablet Take 2.5 mg by mouth daily.     ??? hydrocortisone 2.5 % cream APPLY TOPICALLY 2 (TWO) TIMES DAILY APPLY AFTER BOWEL MOVEMENT     ??? levothyroxine (SYNTHROID) 88 MCG tablet Take 88 mcg by mouth daily at 0600.     ??? lisinopril (PRINIVIL,ZESTRIL) 40 MG tablet Take 40 mg by mouth daily.     ??? melatonin 3 mg Tab Take 3 mg by mouth nightly.      ??? mesalamine (APRISO) 0.375 gram 24 hr capsule Take 1.5 mg by mouth daily. 4 capsules     ??? mupirocin (BACTROBAN) 2 % ointment Apply 1 application topically in the morning.     ??? ondansetron (ZOFRAN) 4 MG tablet Take 1 tablet (4 mg total) by mouth every eight (8) hours as needed. (Patient not taking: Reported on 11/20/2021) 30 tablet 2   ??? ONETOUCH ULTRA BLUE TEST STRIP Strp USE ONCE DAILY. USE AS INSTRUCTED.  12   ??? ONETOUCH ULTRAMINI kit Use as directed.  0   ??? pregabalin (LYRICA) 150 MG capsule 150 mg Two (2) times a day.      ??? sitaGLIPtin-metFORMIN (JANUMET) 50-500 mg per tablet TAKE 2 TABLETS BY MOUTH AT BEDTIME       No current facility-administered medications for this visit.       Allergies   Allergen Reactions   ??? Morphine Hallucinations, Other (See Comments) and Anaphylaxis     AMS - agitation and delusions **No legal documents to be signed if taking, per family**  AMS - agitation and delusions **No legal documents to be signed if taking, per family**   ??? Gentamicin        Patient Active Problem List   Diagnosis   ??? Closed fracture of sternum   ??? Closed fracture of left scapula   ??? Multiple rib fractures   ??? Closed fracture of sacrum (CMS-[name])   ??? Closed compression fracture of L1 lumbar vertebra   ??? Hypertension   ??? Post-menopausal osteoporosis   ??? Osteoporosis with  current pathological fracture   ??? Acute thromboembolism of deep veins of lower extremity (CMS-[name])   ??? Cardiac pacemaker   ??? Chronic venous insufficiency   ??? Anticoagulant long-term use   ??? CKD (chronic kidney disease) stage 3, GFR 30-59 ml/min (CMS-[name])   ??? Deep vein thrombosis (DVT) of right lower extremity (CMS-[name])   ??? Diabetic nephropathy (CMS-[name])   ??? Hip fracture (CMS-[name])   ??? Lumbar radiculopathy   ??? Lymphedema   ??? Primary parkinsonism (CMS-[name])   ??? Protein S deficiency (CMS-[name])   ??? Delirium   ??? Yolk sac tumor of the ovary   ??? Encounter for chemotherapy management   ??? Hypomagnesemia       Reviewed and up to date in Epic.    Appropriateness of Therapy     Acute infections noted within Epic:  No active infections  Patient reported infection: None    Is medication and dose appropriate based on diagnosis and infection status? Yes    Prescription has been clinically reviewed: Yes      Baseline Quality of Life Assessment      How many days over the past month did your condition  keep you from your normal activities? For example, brushing your teeth or getting up in the morning. 0    Financial Information     Medication Assistance provided: None Required    Anticipated copay of $218.73 reviewed with patient. Verified delivery address. Daughter approved copay     Delivery Information     Scheduled delivery date: 12/03/21    Expected start date: TBD    Medication will be delivered via Same Day Courier to the temporary address in [place].  This shipment will not require a signature.      Explained the services we provide at [facility] and that each month we would call to set up refills.  Stressed importance of returning phone calls so that we could ensure they receive their medications in time each month.  Informed patient that we should be setting up refills 7-10 days prior to when they will run out of medication.  A pharmacist will reach out to perform a clinical assessment periodically.  Informed patient that a welcome packet, containing information about our pharmacy and other support services, a Notice of Privacy Practices, and a drug information handout will be sent.      The patient or caregiver noted above participated in the development of this care plan and knows that they can request review of or adjustments to the care plan at any time.      Patient or caregiver verbalized understanding of the above information as well as how to contact the pharmacy at [phone] option 4 with any questions/concerns.  The pharmacy is open Monday through Friday 8:30am-4:30pm.  A pharmacist is available 24/7 via pager to answer any clinical questions they may have.    Patient Specific Needs     - Does the patient have any physical, cognitive, or cultural barriers? No    - Does the patient have adequate living arrangements? (i.e. the ability to store and take their medication appropriately) Yes    - Did you identify any home environmental safety or security hazards? No    - Patient prefers to have medications discussed with  Caregiver     - Is the patient or caregiver able to read and understand education materials at a high school level or above? Yes    - Patient's primary language  is  English     - Is the patient high risk? Yes, patient is taking oral chemotherapy. Appropriateness of therapy as been assessed    SOCIAL DETERMINANTS OF HEALTH     At the [facility], we have learned that life circumstances - like trouble affording food, housing, utilities, or transportation can affect the health of many of our patients.   That is why we wanted to ask: are you currently experiencing any life circumstances that are negatively impacting your health and/or quality of life? No    Social Determinants of Health     Food Insecurity: Not on file   Tobacco Use: Medium Risk   ??? Smoking Tobacco Use: Former   ??? Smokeless Tobacco Use: Never   ??? Passive Exposure: Not on file   Transportation Needs: Not on file   Alcohol Use: Not on file   Housing/Utilities: Not on file   Substance Use: Not on file   Financial Resource Strain: Not on file   Physical Activity: Not on file   Health Literacy: Not on file   Stress: Not on file   Intimate Partner Violence: Not on file   Depression: Not on file   Social Connections: Not on file       Would you be willing to receive help with any of the needs that you have identified today? Not applicable       Herberto Ledwell Vangie Bicker  [facility] Specialty Pharmacist

## 2021-12-08 NOTE — Unmapped (Signed)
Specialty Medication Follow-up    Patricia Perez is a 86 y.o. female with recurrent ovarian yolk sac cancer who I am seeing for follow up on their treatment with oral etoposide.     Chemotherapy: Etoposide 50 mg PO daily days 1-14/28  Cycle date: Cycle 1 Day 1 (12/04/21)    A/P:   1. Oral Chemotherapy: Patient scheduled to start oral etoposide with weekly labs obtained on Thursdays. Will assess toxicity in 1 week.   ?? Start etoposide 50 mg PO daily  ?? Obtain CBC w/diff weekly    I spent approximately 5 minutes in direct patient care.    Next follow up: In 1 week with lab results     Referring physician: Dr. Mauricia Area, PharmD, BCOP, CPP  Gynecologic Oncology Clinic Pharmacist  Pager: [phone]    S/O: Ms. Mickler caretaker was contacted to coordinate labs and initiation of oral chemotherapy.     Medications reviewed and updated in EPIC? no    Missed doses: N/A (not yet started)    Labs (no new labs)    Gynecologic Oncology    Gynecologic Oncology Metrics:      Dose and Schedule: Etoposide 50 mg PO daily days 1-14/28     Chemotherapy Dose: Dose Documented     Chemotherapy Schedule: Schedule documented

## 2021-12-11 NOTE — Unmapped (Signed)
Specialty Medication Follow-up    Patricia Perez is a 86 y.o. female with recurrent ovarian yolk sac cancer who I am seeing for follow up on their treatment with oral etoposide.     Chemotherapy: Etoposide 50 mg PO daily days 1-14/28  Cycle date: Cycle 1 Day 1 (12/04/21)    A/P:   1. Oral Chemotherapy: No new labs to review. Grade 1 fatigue which has worsened. No grade 3 toxicities therefore will continue at current dose intensity. Plan to repeat labs later this week for toxicity assessment. Confirmed with Patricia Perez the plan is for labs on Thursday.   ?? Continue etoposide 50 mg PO daily  ?? Obtain CBC w/diff weekly    I spent approximately 5 minutes in direct patient care.    Next follow up: In 1 week with lab results     Referring physician: Dr. Mauricia Area, PharmD, BCOP, CPP  Gynecologic Oncology Clinic Pharmacist  Pager: [phone]    S/O: Patricia Perez daughter was contacted via mychart regarding oral chemotherapy toxicity assessment. She endorses that overall she is doing well. She endorses mild fatigue that is relieved by rest. She denies any mouth sores or nausea.     Medications reviewed and updated in EPIC? no    Missed doses: -----    Labs (no new labs)    Gynecologic Oncology    Gynecologic Oncology Metrics:      Dose and Schedule: Etoposide 50 mg PO daily days 1-14/28     Chemotherapy Dose: Dose Documented     Chemotherapy Schedule: Schedule documented     NCI CTCAE: Fatigue - Grade 1

## 2021-12-25 DIAGNOSIS — C561 Malignant neoplasm of right ovary: Principal | ICD-10-CM

## 2021-12-25 NOTE — Unmapped (Signed)
Called pt to schedule appt with Dr. Reyne Dumas on 2/27

## 2021-12-28 DIAGNOSIS — I10 Essential (primary) hypertension: Secondary | ICD-10-CM | POA: Diagnosis not present

## 2021-12-28 DIAGNOSIS — E039 Hypothyroidism, unspecified: Secondary | ICD-10-CM

## 2021-12-28 DIAGNOSIS — F39 Unspecified mood [affective] disorder: Secondary | ICD-10-CM

## 2021-12-28 DIAGNOSIS — E1159 Type 2 diabetes mellitus with other circulatory complications: Secondary | ICD-10-CM | POA: Diagnosis not present

## 2021-12-28 DIAGNOSIS — E46 Unspecified protein-calorie malnutrition: Secondary | ICD-10-CM

## 2021-12-28 DIAGNOSIS — N1832 Chronic kidney disease, stage 3b: Secondary | ICD-10-CM | POA: Diagnosis not present

## 2021-12-28 DIAGNOSIS — C569 Malignant neoplasm of unspecified ovary: Secondary | ICD-10-CM | POA: Diagnosis not present

## 2021-12-29 NOTE — Unmapped (Signed)
Specialty Medication Follow-up    Patricia Perez is a 86 y.o. female with recurrent ovarian yolk sac cancer who I am seeing for follow up on their treatment with oral etoposide.     Chemotherapy: Etoposide 50 mg PO daily days 1-14/28  Cycle date: Cycle 1 Day 1 (12/04/21)    A/P:   1. Oral Chemotherapy: Some labs available for review. Grade 1 thrombocytopenia which has worsened. Grade 2 serum creatinine elevation which has improved. Given the difficulty obtaining labs and new onset diarrhea will wait to start cycle 2 until after a clinic evaluation and labs. Plan to start cycle 2 on 01/04/22. Would recommend starting at the same dose given thrombocytopenia present at the conclusion of the etoposide course.   ?? Continue etoposide 50 mg PO daily  ?? Obtain CBC w/diff weekly    I spent approximately 5 minutes in direct patient care.    Next follow up: In 1 week for clinic visit (01/04/22)    Referring physician: Dr. Mauricia Area, PharmD, BCOP, CPP  Gynecologic Oncology Clinic Pharmacist  Pager: [phone]    S/O: Patricia Perez daughter was contacted via mychart regarding oral chemotherapy toxicity assessment. She endorses that her mother's hair is starting to fall out a bit. She endorses that she is experiencing diarrhea and is reaching out to discuss with her GI physician given that they recently reduced her mesalamine prescription.     Medications reviewed and updated in EPIC? no    Missed doses: -----    Labs (12/17/21)  Plt 85 x10*9/L   ANC 2.4 x10*9/L   SCr 1.62 mg/dL

## 2022-01-05 ENCOUNTER — Other Ambulatory Visit
Admission: RE | Admit: 2022-01-05 | Discharge: 2022-01-05 | Disposition: A | Discharge status: Home / Self Care | Payer: Medicare Other | Source: Ambulatory Visit | Attending: Gastroenterology | Admitting: Gastroenterology

## 2022-01-05 DIAGNOSIS — R197 Diarrhea, unspecified: Secondary | ICD-10-CM | POA: Diagnosis present

## 2022-01-05 LAB — GASTROINTESTINAL PANEL BY PCR, STOOL (REPLACES STOOL CULTURE)

## 2022-01-05 LAB — C DIFFICILE QUICK SCREEN W PCR REFLEX
C Diff antigen: NEGATIVE
C Diff interpretation: NOT DETECTED
C Diff toxin: NEGATIVE

## 2022-01-08 LAB — CALPROTECTIN, FECAL: Calprotectin, Fecal: 32 ug/g (ref 0–120)

## 2022-01-09 NOTE — Unmapped (Signed)
Referring Physician: Referred Self  No address on file.     PCP: Verdell Carmine, PA      ESTABLISHED PATIENT NOTE    Assessment and Plan:  Patricia Perez is doing well and tolerating treatment.    Plan: labs reviewed and she can start C2 etoposide 14 days month. AFP also ordered. Will plan Imaging after C3 or 4.     I reviewed Patricia Perez old records and labs from [facility] since her last visit with me, see summary below.         History of Present Illness:   Patricia Perez is a 86 y.o. woman.  Oncology History Overview Note   12/15/20:  Exploratory laparotomy, bilateral salpingo-oophorectomy, R pelvic and para-aortic lymphadenectomy, omentectomy, peritoneal biopsies, oversew of partial thickness sigmoid colon injury.   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 Recommend: BEP treatment x 3.  LDH is not a marker for her but AFP is (440).    01/12/21-03/06/21 C 1-3 BEP.  AFP 5/22=22    04/02/21 CTches/ab/ pelvis: no recurrence but in pelvis: Interval sequela of bilateral salpingo-oophorectomy and resection of a previously identified right adnexal mass. There is indeterminate hypoattenuating 3.3 cm right pelvic sidewall lesion which in the postoperative setting may represent a seroma/lymphocele, however residual disease is not entirely excluded. Recommend short interval follow up.  Plan f/up CT ini 3 mos.    07/01/21 Ct --Similar-appearing indeterminate right pelvic sidewall hypoattenuating mass measuring up to 2.4 cm on remeasurement. No new sites of disease the abdomen or pelvis. Left lower quadrant omental nodularity has improved when compared to prior exam.     Yolk sac tumor of the ovary   12/15/2020 Initial Diagnosis    Yolk sac tumor of the ovary     12/15/2020 Surgery    Exploratory laparotomy, bilateral salpingo-oophorectomy, R pelvic and para-aortic lymphadenectomy, omentectomy, peritoneal biopsies     12/15/2020 -  Cancer Staged    Staging form: Ovary, AJCC 7th Edition  - Clinical stage from 12/15/2020: FIGO Stage IC3, calculated as Stage IC (T1c, N0, M0) - Signed by Marlow Baars, MD on 12/31/2020       12/31/2020 Tumor Board    Stage: Stage IC3 yolk sac tumor     Plan: Patient-centered discussion. Standard of care is BEP chemotherapy 3 to 4 cycles.      01/12/2021 - 01/16/2021 Chemotherapy    IP/OP OVARIAN EP (YOLK SAC TUMOR NO PRIOR XRT)  BLEOmycin 20 units/m2 IV on day 1, etoposide 75 mg/m2 IV on days 1-5, CISplatin 20 mg/m2 IV on days 1-5, every 21 days     02/09/2021 -  Chemotherapy    OP Ovarian Yolk sac EP (ETOPOSIDE/CISPLATIN)  BLEOmycin 30 units IV on days 2, 9, 16, etoposide 100 mg/m2 IV on days 1-5, CISplatin 20 mg/m2 days 1-5, every 21 days     07/01/2021 Interval Scan(s)    Ct ab/pelvis: stable findings. --Similar-appearing indeterminate right pelvic sidewall hypoattenuating mass measuring up to 2.4 cm on remeasurement. No new sites of disease the abdomen or pelvis.   Left lower quadrant omental nodularity has improved when compared to prior exam.      Interval Scan(s)    07/07/21 AFP increasing to 97.  Pet ordered to clarify earlier CT and showed possible recurrence: Hypermetabolic soft tissue density lesion in the right pelvis which may represent recurrent disease. Consider correlation with MRI.     -Focal intense uptake is seen in the rectosigmoid junction, concerning but not definitive  for a malignant or benign lesion. Recommend clinical correlation. Consider colonoscopy or MRI.     07/30/2021 Interval Scan(s)    PET: Hypermetabolic soft tissue density lesion in the right pelvis which may represent recurrent disease. Consider correlation with MRI.(Was 2 cm on CT)     -Focal intense uptake is seen in the rectosigmoid junction, concerning but not definitive for a malignant or benign lesion. Recommend clinical correlation. Consider colonoscopy or MRI.     -Posterior body wall stranding around the sacrum which may represent early pressure injury development. Recommend clinical.     09/17/2021 Recurrence    CT-guided biopsy of pelvic mass with recurrent yolk sac tumor     10/14/2021 Tumor Board    Stage: recurrent yolk sac tumor    Plan: Given oligometastatic disease, if functional status would allow, consider surgical resection. However, given that she is wheelchair bound and imaging with concern for sacral pressure ulcer, patient may not be a surgical candidate.   - If not offering resection, consider salvage chemotherapy. NCCN guidelines suggest that paclitaxel/ifosfamide/cisplatin is the preferred curative regimen for recurrent germ cell tumors. However, could also consider regimens such as carboplatin/paclitaxel, or single agent regimens such as taxotere or paclitaxel, depending on performance status  - Colonoscopy to evaluate hypermetabolic areas in rectosigmoid and transverse colon on PET     11/09/2021 -  Chemotherapy    Started oral etoposide 50 mg qd     11/27/2021 Interval Scan(s)    Bas[place]ie CT:    1.Enlarging, now 2.2 cm soft tissue density in the right pelvis which corresponds to an area that was FDG avid on recent PET/CT scan and could represent disease recurrence.  2.Area of FDG avidity in the rectosigmoid junction on recent PET/CT is not identified on this examination. Correlation with colonoscopy could be considered if clinically indicated.  3.Previously described hypoattenuating 2.6 cm right pelvic sidewall lesion with central hypoattenuation, is slightly increased in size and may represent a cystic lesion versus lymph node.       Patricia Perez is here for follow up. She is undergoing treatment for recurrent yolk sac tumor.  After consultation with Dr. Marla Roe, given age and PS, started on oral etoposide 50 mg.  Most recently this was held due to diarrhea; uncertain if this was due to GI etiology or etoposide.  Modifications made to GI meds and diarrhea resolved. She has no gyn symptoms      Medical/Surgical History:  Medical problems and medications are reviewed and updated in EPIC.    Past Medical History: Diagnosis Date   ??? Colitis    ??? Depression (emotion)    ??? Diabetes mellitus (CMS-[name])    ??? Disease of thyroid gland    ??? Hypertension    ??? Hypothyroidism (acquired)      Past Surgical History:   Procedure Laterality Date   ??? ANKLE SURGERY     ??? hysterectomy      ??? IR INSERT PORT AGE GREATER THAN 5 YRS  01/19/2021    IR INSERT PORT AGE GREATER THAN 5 YRS 01/19/2021 Ammie Dalton, MD [facility]   ??? IR INSERT PORT AGE LESS THAN 5 YRS  01/19/2021    IR INSERT PORT AGE LESS THAN 5 YRS 01/19/2021 Ammie Dalton, MD [facility]   ??? LIPOMA RESECTION     ??? PR OOPH W/RADIC DISSECT FOR DEBULKING Right 12/15/2020    Procedure: RESECTION (INITIAL) OVARIAN, TUBAL/PRIM PERITONEAL MALIG W/BIL S&O/OMENTECT; W/RAD DISSECTION FOR DEBULKING;  Surgeon:  Haze Boyden, MD;  Location: MAIN OR  [facility];  Service: Gynecology Oncology   ??? TONSILLECTOMY           Social History:   Social History     Social History Narrative   ??? Not on file     See above    Physical Exam:  BP 138/92  - Pulse 65  - Temp 35.8 ??C (96.5 ??F) (Temporal)  - Resp 16  - Wt 86.7 kg (191 lb 1.6 oz)  - SpO2 100%  - BMI 29.93 kg/m??   General: Patient is comfortable appearing.         Verlin Dike, MD

## 2022-01-10 DIAGNOSIS — C561 Malignant neoplasm of right ovary: Principal | ICD-10-CM

## 2022-01-11 ENCOUNTER — Other Ambulatory Visit: Admit: 2022-01-11 | Discharge: 2022-01-11 | Payer: MEDICARE

## 2022-01-11 ENCOUNTER — Ambulatory Visit: Admit: 2022-01-11 | Discharge: 2022-01-11 | Payer: MEDICARE

## 2022-01-11 DIAGNOSIS — C561 Malignant neoplasm of right ovary: Principal | ICD-10-CM

## 2022-01-11 LAB — COMPREHENSIVE METABOLIC PANEL
ALBUMIN: 3.5 g/dL (ref 3.4–5.0)
ALKALINE PHOSPHATASE: 46 U/L (ref 46–116)
ALT (SGPT): 9 U/L — ABNORMAL LOW (ref 10–49)
ANION GAP: 9 mmol/L (ref 5–14)
AST (SGOT): 15 U/L (ref <=34)
BILIRUBIN TOTAL: 0.6 mg/dL (ref 0.3–1.2)
BLOOD UREA NITROGEN: 34 mg/dL — ABNORMAL HIGH (ref 9–23)
BUN / CREAT RATIO: 21
CALCIUM: 9.7 mg/dL (ref 8.7–10.4)
CHLORIDE: 107 mmol/L (ref 98–107)
CO2: 28 mmol/L (ref 20.0–31.0)
CREATININE: 1.62 mg/dL — ABNORMAL HIGH
EGFR CKD-EPI (2021) FEMALE: 31 mL/min/{1.73_m2} — ABNORMAL LOW (ref >=60)
GLUCOSE RANDOM: 100 mg/dL (ref 70–179)
POTASSIUM: 4 mmol/L (ref 3.5–5.1)
PROTEIN TOTAL: 6.4 g/dL (ref 5.7–8.2)
SODIUM: 144 mmol/L (ref 135–145)

## 2022-01-11 LAB — CBC W/ AUTO DIFF
BASOPHILS ABSOLUTE COUNT: 0 10*9/L (ref 0.0–0.1)
BASOPHILS RELATIVE PERCENT: 0.5 %
EOSINOPHILS ABSOLUTE COUNT: 0 10*9/L (ref 0.0–0.5)
EOSINOPHILS RELATIVE PERCENT: 0.4 %
HEMATOCRIT: 36 % (ref 34.0–44.0)
HEMOGLOBIN: 12.3 g/dL (ref 11.3–14.9)
LYMPHOCYTES ABSOLUTE COUNT: 1.2 10*9/L (ref 1.1–3.6)
LYMPHOCYTES RELATIVE PERCENT: 14.8 %
MEAN CORPUSCULAR HEMOGLOBIN CONC: 34.3 g/dL (ref 32.0–36.0)
MEAN CORPUSCULAR HEMOGLOBIN: 30.1 pg (ref 25.9–32.4)
MEAN CORPUSCULAR VOLUME: 87.9 fL (ref 77.6–95.7)
MEAN PLATELET VOLUME: 11.6 fL — ABNORMAL HIGH (ref 6.8–10.7)
MONOCYTES ABSOLUTE COUNT: 0.9 10*9/L — ABNORMAL HIGH (ref 0.3–0.8)
MONOCYTES RELATIVE PERCENT: 10.8 %
NEUTROPHILS ABSOLUTE COUNT: 5.8 10*9/L (ref 1.8–7.8)
NEUTROPHILS RELATIVE PERCENT: 73.5 %
PLATELET COUNT: 111 10*9/L — ABNORMAL LOW (ref 150–450)
RED BLOOD CELL COUNT: 4.1 10*12/L (ref 3.95–5.13)
RED CELL DISTRIBUTION WIDTH: 15.8 % — ABNORMAL HIGH (ref 12.2–15.2)
WBC ADJUSTED: 7.9 10*9/L (ref 3.6–11.2)

## 2022-01-11 LAB — AFP TUMOR MARKER: AFP-TUMOR MARKER: 720 ng/mL — ABNORMAL HIGH (ref <=8)

## 2022-01-11 NOTE — Unmapped (Signed)
Port accessed.  Labs drawn & sent for analysis.  Port flushed, heparin-locked & de-accessed. To next appt.   Care provided by Normajean Baxter RN.

## 2022-01-11 NOTE — Unmapped (Signed)
[facility] Clinical Assessment & Refill Coordination Note    Patricia Perez, DOB: 1935/11/14  Phone: [phone] (home) [phone] (work)    All above HIPAA information was verified with patient's caregiver, [name].     Was a Nurse, learning disability used for this call? No    Specialty Medication(s):   Hematology/Oncology: etoposide     Current Outpatient Medications   Medication Sig Dispense Refill   ??? alendronate (FOSAMAX) 70 MG tablet Take 1 tablet (70 mg total) by mouth once a week. (Patient taking differently: Take 70 mg by mouth once a week. Every Friday) 12 tablet 3   ??? apixaban (ELIQUIS) 2.5 mg Tab Take 1 tablet (2.5 mg total) by mouth Two (2) times a day. 60 tablet 3   ??? atenoloL (TENORMIN) 50 MG tablet Take 1 tablet (50 mg total) by mouth daily. 30 tablet 3   ??? atorvastatin (LIPITOR) 40 MG tablet Take 1 tablet (40 mg total) by mouth daily. 30 tablet 0   ??? calcium-vitamin D 500 mg(1,250mg ) -200 unit per tablet Take 1 tablet by mouth 2 (two) times a day with meals.     ??? cholecalciferol, vitamin D3-50 mcg, 2,000 unit,, 50 mcg (2,000 unit) cap Take 50 mcg by mouth daily.      ??? DULoxetine (CYMBALTA) 30 MG capsule Take 1 capsule (30 mg total) by mouth daily. 30 capsule 0   ??? ergocalciferol-1,250 mcg, 50,000 unit, (DRISDOL) 1,250 mcg (50,000 unit) capsule Take 50,000 Units by mouth.     ??? etoposide (VEPESID) 50 mg capsule Take 1 capsule (50 mg total) by mouth daily for 2 weeks then OFF for 2 weeks. 14 capsule 5   ??? furosemide (LASIX) 20 MG tablet Take 20 mg by mouth daily as needed.     ??? glipiZIDE (GLUCOTROL XL) 2.5 MG 24 hr tablet Take 2.5 mg by mouth daily.     ??? hydrocortisone 2.5 % cream APPLY TOPICALLY 2 (TWO) TIMES DAILY APPLY AFTER BOWEL MOVEMENT     ??? levothyroxine (SYNTHROID) 88 MCG tablet Take 88 mcg by mouth daily at 0600.     ??? lisinopril (PRINIVIL,ZESTRIL) 40 MG tablet Take 40 mg by mouth daily.     ??? melatonin 3 mg Tab Take 3 mg by mouth nightly.      ??? mesalamine (APRISO) 0.375 gram 24 hr capsule Take 1.5 mg by mouth daily. 4 capsules     ??? mupirocin (BACTROBAN) 2 % ointment Apply 1 application topically in the morning.     ??? ondansetron (ZOFRAN) 4 MG tablet Take 1 tablet (4 mg total) by mouth every eight (8) hours as needed. (Patient not taking: Reported on 11/20/2021) 30 tablet 2   ??? ONETOUCH ULTRA BLUE TEST STRIP Strp USE ONCE DAILY. USE AS INSTRUCTED.  12   ??? ONETOUCH ULTRAMINI kit Use as directed.  0   ??? pregabalin (LYRICA) 150 MG capsule 150 mg Two (2) times a day.      ??? sitaGLIPtin-metFORMIN (JANUMET) 50-500 mg per tablet TAKE 2 TABLETS BY MOUTH AT BEDTIME       No current facility-administered medications for this visit.        Changes to medications: [name] reports no changes at this time.    Allergies   Allergen Reactions   ??? Morphine Hallucinations, Other (See Comments) and Anaphylaxis     AMS - agitation and delusions **No legal documents to be signed if taking, per family**  AMS - agitation and delusions **No legal documents to  be signed if taking, per family**   ??? Gentamicin        Changes to allergies: No    SPECIALTY MEDICATION ADHERENCE     Etoposide 50 mg: 0 days of medicine on hand     Medication Adherence    Patient reported X missed doses in the last month: 0  Specialty Medication: Etoposide 50mg           Specialty medication(s) dose(s) confirmed: Regimen is correct and unchanged.     Are there any concerns with adherence? No    Adherence counseling provided? Not needed    CLINICAL MANAGEMENT AND INTERVENTION      Clinical Benefit Assessment:    Do you feel the medicine is effective or helping your condition? Yes    Clinical Benefit counseling provided? Not needed    Adverse Effects Assessment:    Are you experiencing any side effects? No    Are you experiencing difficulty administering your medicine? No    Quality of Life Assessment:    Quality of Life      Oncology  1. What impact has your specialty medication had on the reduction of your daily pain or discomfort level?: None  2. On a scale of 1-10, how would you rate your ability to manage side effects associated with your specialty medication? (1=no issues, 10 = unable to take medication due to side effects): 1            How many days over the past month did your condition/medication  keep you from your normal activities? For example, brushing your teeth or getting up in the morning. 0    Have you discussed this with your provider? Not needed    Acute Infection Status:    Acute infections noted within Epic:  No active infections  Patient reported infection: None    Therapy Appropriateness:    Is therapy appropriate and patient progressing towards therapeutic goals? Yes, therapy is appropriate and should be continued    DISEASE/MEDICATION-SPECIFIC INFORMATION      N/A    PATIENT SPECIFIC NEEDS     - Does the patient have any physical, cognitive, or cultural barriers? No    - Is the patient high risk? Yes, patient is taking oral chemotherapy. Appropriateness of therapy as been assessed    - Does the patient require a Care Management Plan? No           SHIPPING     Specialty Medication(s) to be Shipped:   Hematology/Oncology: Etoposide    Other medication(s) to be shipped: No additional medications requested for fill at this time     Changes to insurance: No    Delivery Scheduled: Yes, Expected medication delivery date: 01/12/22.     Medication will be delivered via Same Day Courier to the confirmed prescription address in [facility].    The patient will receive a drug information handout for each medication shipped and additional FDA Medication Guides as required.  Verified that patient has previously received a Conservation officer, historic buildings and a Surveyor, mining.    The patient or caregiver noted above participated in the development of this care plan and knows that they can request review of or adjustments to the care plan at any time.      All of the patient's questions and concerns have been addressed.    Rollen Sox   [facility] Specialty Pharmacist

## 2022-01-12 MED FILL — ETOPOSIDE 50 MG CAPSULE: ORAL | 28 days supply | Qty: 14 | Fill #1

## 2022-01-12 NOTE — Unmapped (Signed)
Specialty Medication Follow-up    Patricia Perez is a 86 y.o. female with recurrent ovarian yolk sac cancer who I am seeing for follow up on their treatment with oral etoposide.     Chemotherapy: Etoposide 50 mg PO daily days 1-14/28  Cycle date: Cycle 2 Day 1 (01/12/22)    A/P:   1. Oral Chemotherapy: CBC w/diff and CMP reviewed. Grade 1 thrombocytopenia which has improved. Grade 2 serum creatinine elevation which is stable. Patient meets treatment parameters to start cycle 2 oral etoposide. Will repeat labs on 3/16 after approximately 1.5 weeks of treatment in cycle 2. Care was coordinate with current living facility.   ?? Continue etoposide 50 mg PO daily  ?? Obtain CBC w/diff weekly    I spent approximately 15 minutes in direct patient care.    Next follow up: In 2 weeks for lab assessment (01/21/22)    Referring physician: Dr. Mauricia Area, PharmD, BCOP, CPP  Gynecologic Oncology Clinic Pharmacist  Pager: [phone]    S/O: Patricia Perez presents to clinic for follow up with Dr. Reyne Dumas after cycle 1 etoposide. She endorses that overall she is feeling well. She did well during the etoposide cycle. After completing etoposide she had a couple episodes of diarrhea which she attributes to her food intake. She saw her GI specialist who increased her mesalamine back up to what she was previously on. She reports that a stool sample was obtained and was negative for pathogens. She denies constipation, nausea, vomiting, or mouth sores. She does endorse thinning of hair and fatigue. She attributes the fatigue to her hallway being on lock down for a covid infection risk.     Medications reviewed and updated in EPIC? no    Missed doses: -----    Labs  Lab on 01/11/2022   Component Date Value Ref Range Status   ??? Sodium 01/11/2022 144  135 - 145 mmol/L Final   ??? Potassium 01/11/2022 4.0  3.5 - 5.1 mmol/L Final   ??? Chloride 01/11/2022 107  98 - 107 mmol/L Final   ??? CO2 01/11/2022 28.0  20.0 - 31.0 mmol/L Final   ??? Anion Gap 01/11/2022 9  5 - 14 mmol/L Final   ??? BUN 01/11/2022 34 (H)  9 - 23 mg/dL Final   ??? Creatinine 01/11/2022 1.62 (H)  0.60 - 0.80 mg/dL Final   ??? BUN/Creatinine Ratio 01/11/2022 21   Final   ??? eGFR CKD-EPI (2021) Female 01/11/2022 31 (L)  >=60 mL/min/1.57m2 Final    eGFR calculated with CKD-EPI 2021 equation in accordance with [employer] and [employer] of Nephrology Task Force recommendations.   ??? Glucose 01/11/2022 100  70 - 179 mg/dL Final   ??? Calcium 29/52/8413 9.7  8.7 - 10.4 mg/dL Final   ??? Albumin 24/40/1027 3.5  3.4 - 5.0 g/dL Final   ??? Total Protein 01/11/2022 6.4  5.7 - 8.2 g/dL Final   ??? Total Bilirubin 01/11/2022 0.6  0.3 - 1.2 mg/dL Final   ??? AST 25/36/6440 15  <=34 U/L Final   ??? ALT 01/11/2022 9 (L)  10 - 49 U/L Final   ??? Alkaline Phosphatase 01/11/2022 46  46 - 116 U/L Final   ??? AFP-Tumor Marker 01/11/2022 720 (H)  <=8 ng/mL Final   ??? WBC 01/11/2022 7.9  3.6 - 11.2 10*9/L Final   ??? RBC 01/11/2022 4.10  3.95 - 5.13 10*12/L Final   ??? HGB 01/11/2022 12.3  11.3 - 14.9  g/dL Final   ??? HCT 25/36/6440 36.0  34.0 - 44.0 % Final   ??? MCV 01/11/2022 87.9  77.6 - 95.7 fL Final   ??? MCH 01/11/2022 30.1  25.9 - 32.4 pg Final   ??? MCHC 01/11/2022 34.3  32.0 - 36.0 g/dL Final   ??? RDW 34/74/2595 15.8 (H)  12.2 - 15.2 % Final   ??? MPV 01/11/2022 11.6 (H)  6.8 - 10.7 fL Final   ??? Platelet 01/11/2022 111 (L)  150 - 450 10*9/L Final   ??? Neutrophils % 01/11/2022 73.5  % Final   ??? Lymphocytes % 01/11/2022 14.8  % Final   ??? Monocytes % 01/11/2022 10.8  % Final   ??? Eosinophils % 01/11/2022 0.4  % Final   ??? Basophils % 01/11/2022 0.5  % Final   ??? Absolute Neutrophils 01/11/2022 5.8  1.8 - 7.8 10*9/L Final   ??? Absolute Lymphocytes 01/11/2022 1.2  1.1 - 3.6 10*9/L Final   ??? Absolute Monocytes 01/11/2022 0.9 (H)  0.3 - 0.8 10*9/L Final   ??? Absolute Eosinophils 01/11/2022 0.0  0.0 - 0.5 10*9/L Final   ??? Absolute Basophils 01/11/2022 0.0  0.0 - 0.1 10*9/L Final       Gynecologic Oncology    Gynecologic Oncology Metrics:      Dose and Schedule: Etoposide 50 mg PO daily days 1-14/28     Chemotherapy Dose: Dose Documented     Chemotherapy Schedule: Schedule documented     NCI CTCAE: Thrombocytopenia - Grade 1, Serum creatinine - Grade 2

## 2022-01-17 ENCOUNTER — Emergency Department: Payer: Medicare Other

## 2022-01-17 ENCOUNTER — Observation Stay: Payer: Medicare Other

## 2022-01-17 ENCOUNTER — Observation Stay
Admission: EM | Admit: 2022-01-17 | Discharge: 2022-01-18 | Disposition: A | Discharge status: Skilled Nursing Facility | Payer: Medicare Other | Attending: Internal Medicine | Admitting: Internal Medicine

## 2022-01-17 ENCOUNTER — Other Ambulatory Visit: Payer: Self-pay

## 2022-01-17 ENCOUNTER — Observation Stay
Admit: 2022-01-17 | Discharge: 2022-01-17 | Disposition: A | Discharge status: Home / Self Care | Payer: Medicare Other | Attending: Internal Medicine | Admitting: Internal Medicine

## 2022-01-17 DIAGNOSIS — R1310 Dysphagia, unspecified: Secondary | ICD-10-CM | POA: Diagnosis not present

## 2022-01-17 DIAGNOSIS — Z7901 Long term (current) use of anticoagulants: Secondary | ICD-10-CM | POA: Diagnosis not present

## 2022-01-17 DIAGNOSIS — I82409 Acute embolism and thrombosis of unspecified deep veins of unspecified lower extremity: Secondary | ICD-10-CM | POA: Diagnosis not present

## 2022-01-17 DIAGNOSIS — E785 Hyperlipidemia, unspecified: Secondary | ICD-10-CM | POA: Diagnosis present

## 2022-01-17 DIAGNOSIS — Z79899 Other long term (current) drug therapy: Secondary | ICD-10-CM | POA: Insufficient documentation

## 2022-01-17 DIAGNOSIS — Z7984 Long term (current) use of oral hypoglycemic drugs: Secondary | ICD-10-CM | POA: Diagnosis not present

## 2022-01-17 DIAGNOSIS — G459 Transient cerebral ischemic attack, unspecified: Secondary | ICD-10-CM | POA: Diagnosis not present

## 2022-01-17 DIAGNOSIS — Z96642 Presence of left artificial hip joint: Secondary | ICD-10-CM | POA: Diagnosis not present

## 2022-01-17 DIAGNOSIS — I130 Hypertensive heart and chronic kidney disease with heart failure and stage 1 through stage 4 chronic kidney disease, or unspecified chronic kidney disease: Secondary | ICD-10-CM | POA: Diagnosis not present

## 2022-01-17 DIAGNOSIS — E119 Type 2 diabetes mellitus without complications: Secondary | ICD-10-CM | POA: Diagnosis not present

## 2022-01-17 DIAGNOSIS — D6859 Other primary thrombophilia: Secondary | ICD-10-CM | POA: Diagnosis present

## 2022-01-17 DIAGNOSIS — N1831 Chronic kidney disease, stage 3a: Secondary | ICD-10-CM | POA: Diagnosis not present

## 2022-01-17 DIAGNOSIS — Z87891 Personal history of nicotine dependence: Secondary | ICD-10-CM | POA: Diagnosis not present

## 2022-01-17 DIAGNOSIS — Z20822 Contact with and (suspected) exposure to covid-19: Secondary | ICD-10-CM | POA: Diagnosis not present

## 2022-01-17 DIAGNOSIS — E039 Hypothyroidism, unspecified: Secondary | ICD-10-CM | POA: Diagnosis present

## 2022-01-17 DIAGNOSIS — R29810 Facial weakness: Secondary | ICD-10-CM | POA: Diagnosis present

## 2022-01-17 DIAGNOSIS — I10 Essential (primary) hypertension: Secondary | ICD-10-CM | POA: Diagnosis present

## 2022-01-17 DIAGNOSIS — Z950 Presence of cardiac pacemaker: Secondary | ICD-10-CM | POA: Insufficient documentation

## 2022-01-17 DIAGNOSIS — R2990 Unspecified symptoms and signs involving the nervous system: Secondary | ICD-10-CM

## 2022-01-17 DIAGNOSIS — C569 Malignant neoplasm of unspecified ovary: Secondary | ICD-10-CM | POA: Diagnosis present

## 2022-01-17 DIAGNOSIS — I5032 Chronic diastolic (congestive) heart failure: Secondary | ICD-10-CM | POA: Diagnosis not present

## 2022-01-17 LAB — CBC WITH DIFFERENTIAL/PLATELET
Abs Immature Granulocytes: 0.03 10*3/uL (ref 0.00–0.07)
Basophils Absolute: 0 10*3/uL (ref 0.0–0.1)
Basophils Relative: 0 %
Eosinophils Absolute: 0 10*3/uL (ref 0.0–0.5)
Eosinophils Relative: 0 %
HCT: 37.4 % (ref 36.0–46.0)
Hemoglobin: 11.5 g/dL — ABNORMAL LOW (ref 12.0–15.0)
Immature Granulocytes: 0 %
Lymphocytes Relative: 9 %
Lymphs Abs: 0.7 10*3/uL (ref 0.7–4.0)
MCH: 29.1 pg (ref 26.0–34.0)
MCHC: 30.7 g/dL (ref 30.0–36.0)
MCV: 94.7 fL (ref 80.0–100.0)
Monocytes Absolute: 0.4 10*3/uL (ref 0.1–1.0)
Monocytes Relative: 5 %
Neutro Abs: 6.7 10*3/uL (ref 1.7–7.7)
Neutrophils Relative %: 86 %
Platelets: 124 10*3/uL — ABNORMAL LOW (ref 150–400)
RBC: 3.95 MIL/uL (ref 3.87–5.11)
RDW: 16.6 % — ABNORMAL HIGH (ref 11.5–15.5)
WBC: 7.9 10*3/uL (ref 4.0–10.5)
nRBC: 0 % (ref 0.0–0.2)

## 2022-01-17 LAB — URINALYSIS, ROUTINE W REFLEX MICROSCOPIC
Bilirubin Urine: NEGATIVE
Glucose, UA: NEGATIVE mg/dL
Hgb urine dipstick: NEGATIVE
Ketones, ur: NEGATIVE mg/dL
Nitrite: POSITIVE — AB
Protein, ur: NEGATIVE mg/dL
Specific Gravity, Urine: 1.019 (ref 1.005–1.030)
pH: 5 (ref 5.0–8.0)

## 2022-01-17 LAB — ECHOCARDIOGRAM COMPLETE
Area-P 1/2: 3.36 cm2
S' Lateral: 2.8 cm

## 2022-01-17 LAB — COMPREHENSIVE METABOLIC PANEL
ALT: 11 U/L (ref 0–44)
AST: 19 U/L (ref 15–41)
Albumin: 3.7 g/dL (ref 3.5–5.0)
Alkaline Phosphatase: 42 U/L (ref 38–126)
Anion gap: 4 — ABNORMAL LOW (ref 5–15)
BUN: 38 mg/dL — ABNORMAL HIGH (ref 8–23)
CO2: 31 mmol/L (ref 22–32)
Calcium: 9.5 mg/dL (ref 8.9–10.3)
Chloride: 105 mmol/L (ref 98–111)
Creatinine, Ser: 1.8 mg/dL — ABNORMAL HIGH (ref 0.44–1.00)
GFR, Estimated: 28 mL/min — ABNORMAL LOW (ref >60)
Glucose, Bld: 133 mg/dL — ABNORMAL HIGH (ref 70–99)
Potassium: 4.6 mmol/L (ref 3.5–5.1)
Sodium: 140 mmol/L (ref 135–145)
Total Bilirubin: 0.9 mg/dL (ref 0.3–1.2)
Total Protein: 6.7 g/dL (ref 6.5–8.1)

## 2022-01-17 LAB — RESP PANEL BY RT-PCR (FLU A&B, COVID) ARPGX2
Influenza A by PCR: NEGATIVE
Influenza B by PCR: NEGATIVE
SARS Coronavirus 2 by RT PCR: NEGATIVE

## 2022-01-17 LAB — BRAIN NATRIURETIC PEPTIDE: B Natriuretic Peptide: 487.8 pg/mL — ABNORMAL HIGH (ref 0.0–100.0)

## 2022-01-17 LAB — ETHANOL: Alcohol, Ethyl (B): <10 mg/dL (ref <10)

## 2022-01-17 LAB — APTT: aPTT: 29 seconds (ref 24–36)

## 2022-01-17 LAB — URINE DRUG SCREEN, QUALITATIVE (ARMC ONLY)
Amphetamines, Ur Screen: NOT DETECTED
Barbiturates, Ur Screen: NOT DETECTED
Benzodiazepine, Ur Scrn: NOT DETECTED
Cannabinoid 50 Ng, Ur ~~LOC~~: NOT DETECTED
Cocaine Metabolite,Ur ~~LOC~~: NOT DETECTED
MDMA (Ecstasy)Ur Screen: NOT DETECTED
Methadone Scn, Ur: NOT DETECTED
Opiate, Ur Screen: NOT DETECTED
Phencyclidine (PCP) Ur S: NOT DETECTED
Tricyclic, Ur Screen: NOT DETECTED

## 2022-01-17 LAB — PROTIME-INR
INR: 1.2 (ref 0.8–1.2)
Prothrombin Time: 14.8 seconds (ref 11.4–15.2)

## 2022-01-17 LAB — GLUCOSE, CAPILLARY
Glucose-Capillary: 168 mg/dL — ABNORMAL HIGH (ref 70–99)
Glucose-Capillary: 137 mg/dL — ABNORMAL HIGH (ref 70–99)

## 2022-01-17 MED ORDER — ACETAMINOPHEN 650 MG RE SUPP
650.0000 mg | RECTAL | Status: DC | PRN
Start: 2022-01-17 — End: 2022-01-18

## 2022-01-17 MED ORDER — MELATONIN 5 MG PO TABS
2.5000 mg | ORAL_TABLET | Freq: Every day | ORAL | Status: DC
  Administered 2022-01-17: 2.5 mg via ORAL
  Filled 2022-01-17: qty 1

## 2022-01-17 MED ORDER — INSULIN ASPART 100 UNIT/ML IJ SOLN
0.0000 [IU] | Freq: Three times a day (TID) | INTRAMUSCULAR | Status: DC
  Administered 2022-01-17 (×2): 2 [IU] via SUBCUTANEOUS
  Administered 2022-01-18: 1 [IU] via SUBCUTANEOUS
  Filled 2022-01-17 (×3): qty 1

## 2022-01-17 MED ORDER — ETOPOSIDE 50 MG PO CAPS: 50.0000 mg | ORAL_CAPSULE | Freq: Every day | ORAL | Status: DC

## 2022-01-17 MED ORDER — VITAMIN D 25 MCG (1000 UNIT) PO TABS
3000.0000 [IU] | ORAL_TABLET | Freq: Every day | ORAL | Status: DC
  Administered 2022-01-18: 3000 [IU] via ORAL
  Filled 2022-01-17: qty 3

## 2022-01-17 MED ORDER — ACETAMINOPHEN 160 MG/5ML PO SOLN
650.0000 mg | ORAL | Status: DC | PRN
  Filled 2022-01-17: qty 20.3

## 2022-01-17 MED ORDER — CALCIUM CARBONATE 1250 (500 CA) MG PO TABS
500.0000 mg | ORAL_TABLET | Freq: Every day | ORAL | Status: DC
  Administered 2022-01-18: 500 mg via ORAL
  Filled 2022-01-17: qty 1

## 2022-01-17 MED ORDER — MESALAMINE ER 250 MG PO CPCR
1500.0000 mg | ORAL_CAPSULE | Freq: Every day | ORAL | Status: DC
  Administered 2022-01-17 – 2022-01-18 (×2): 1500 mg via ORAL
  Filled 2022-01-17 (×3): qty 6

## 2022-01-17 MED ORDER — ONDANSETRON HCL 4 MG/2ML IJ SOLN
4.0000 mg | Freq: Three times a day (TID) | INTRAMUSCULAR | Status: DC | PRN
Start: 2022-01-17 — End: 2022-01-18

## 2022-01-17 MED ORDER — INSULIN ASPART 100 UNIT/ML IJ SOLN: 0.0000 [IU] | Freq: Every day | INTRAMUSCULAR | Status: DC

## 2022-01-17 MED ORDER — LEVOTHYROXINE SODIUM 50 MCG PO TABS
75.0000 ug | ORAL_TABLET | Freq: Every day | ORAL | Status: DC
  Administered 2022-01-18: 75 ug via ORAL
  Filled 2022-01-17: qty 1

## 2022-01-17 MED ORDER — SENNOSIDES-DOCUSATE SODIUM 8.6-50 MG PO TABS: 1.0000 | ORAL_TABLET | Freq: Every evening | ORAL | Status: DC | PRN

## 2022-01-17 MED ORDER — ATORVASTATIN CALCIUM 20 MG PO TABS
40.0000 mg | ORAL_TABLET | Freq: Every day | ORAL | Status: DC
  Administered 2022-01-17 – 2022-01-18 (×2): 40 mg via ORAL
  Filled 2022-01-17 (×2): qty 2

## 2022-01-17 MED ORDER — THIAMINE HCL 100 MG PO TABS
100.0000 mg | ORAL_TABLET | Freq: Every day | ORAL | Status: DC
  Administered 2022-01-17 – 2022-01-18 (×2): 100 mg via ORAL
  Filled 2022-01-17 (×2): qty 1

## 2022-01-17 MED ORDER — HYDRALAZINE HCL 20 MG/ML IJ SOLN: 5.0000 mg | INTRAMUSCULAR | Status: DC | PRN

## 2022-01-17 MED ORDER — STROKE: EARLY STAGES OF RECOVERY BOOK: Freq: Once | Status: AC

## 2022-01-17 MED ORDER — SODIUM CHLORIDE 0.9 % IV BOLUS
1000.0000 mL | Freq: Once | INTRAVENOUS | Status: AC
Start: 2022-01-17 — End: 2022-01-17
  Administered 2022-01-17: 1000 mL via INTRAVENOUS

## 2022-01-17 MED ORDER — ACETAMINOPHEN 325 MG PO TABS: 650.0000 mg | ORAL_TABLET | ORAL | Status: DC | PRN

## 2022-01-17 MED ORDER — PREGABALIN 75 MG PO CAPS
150.0000 mg | ORAL_CAPSULE | Freq: Two times a day (BID) | ORAL | Status: DC
  Administered 2022-01-17 – 2022-01-18 (×3): 150 mg via ORAL
  Filled 2022-01-17 (×3): qty 2

## 2022-01-17 MED ORDER — SODIUM CHLORIDE 0.9 % IV SOLN: INTRAVENOUS | Status: DC

## 2022-01-17 MED ORDER — APIXABAN 2.5 MG PO TABS
2.5000 mg | ORAL_TABLET | Freq: Two times a day (BID) | ORAL | Status: DC
  Administered 2022-01-17 – 2022-01-18 (×2): 2.5 mg via ORAL
  Filled 2022-01-17 (×2): qty 1

## 2022-01-17 MED ORDER — DULOXETINE HCL 30 MG PO CPEP
30.0000 mg | ORAL_CAPSULE | Freq: Every day | ORAL | Status: DC
  Administered 2022-01-18: 30 mg via ORAL
  Filled 2022-01-17: qty 1

## 2022-01-17 NOTE — Progress Notes (Signed)
*  PRELIMINARY RESULTS* ?Echocardiogram ?2D Echocardiogram has been performed. ? ?Sierra [name] ?01/17/2022, 4:25 PM ?

## 2022-01-17 NOTE — ED Notes (Signed)
Pt to CT with RN and on ED transport monitor.  ?

## 2022-01-17 NOTE — Consult Note (Signed)
?                    NEURO HOSPITALIST CONSULT NOTE  ? ?Requestig physician: Dr. Ellender Hose ? ?Reason for Consult: Acute onset of slurred speech and RUE weakness ? ?History obtained from:  Patient, Daughter and Chart   ? ?HPI:                                                                                                                                         ? Sierra [name] is an 86 y.o. 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 EMS was called out for a stroke initially, but did not activate a Code Stroke as based on their assessment, the patient's deficits appeared to have resolved. En route, just prior to arriving to the ED, she developed recurrent right sided weakness and intermittent expressive aphasis. On arrival to the ED, slurred speech and RUE drift were noted. Code Stroke was then activated in Triage. Vitals on arrival to the ED: 117/67, 74, 96 RA, RR 18, CBG 168. ? ?The patient has memory impairment and hence has difficulty relating her history. She best recollections are that staff noticed her deficits but that she herself did not feel any symptoms. Staff noticed her symptoms on their first AM assessment. Best estimate for LKN is last night before going to bed.  ? ?On further interview, her daughter states that LKN was last night at 9:30 PM, prior to going to bed. Per daughter, the patient's initial deficits at her facility are now significantly improved, but with minor symptoms still present. Modified Rankin scale: 0. ? ?Past Medical History:  ?Diagnosis Date  ? Anginal pain ([place])   ? Anxiety   ? Diabetes mellitus without complication ([place])   ? Hypertension   ? Hypothyroidism   ? Protein C deficiency ([place])   ? Protein S deficiency ([place])   ? ? ?Past Surgical History:   ?Procedure Laterality Date  ? ABDOMINAL SURGERY    ? BACK SURGERY    ? BREAST EXCISIONAL BIOPSY Left 2003?  ? benign  ? COLONOSCOPY N/A 09/12/2017  ? Procedure: COLONOSCOPY;  Surgeon: Lollie Sails, MD;  Location: [facility] ENDOSCOPY;  Service: Endoscopy;  Laterality: N/A;  ? COLONOSCOPY WITH PROPOFOL N/A 01/20/2016  ? Procedure: COLONOSCOPY WITH PROPOFOL;  Surgeon: Lollie Sails, MD;  Location: [facility] ENDOSCOPY;  Service: Endoscopy;  Laterality: N/A;  ? FRACTURE SURGERY    ? HIP ARTHROPLASTY Left 01/12/2019  ? Procedure: ARTHROPLASTY BIPOLAR HIP (HEMIARTHROPLASTY), LEFT;  Surgeon: Leim Fabry, MD;  Location: [facility] ORS;  Service: Orthopedics;  Laterality: Left;  ? PACEMAKER LEADLESS INSERTION N/A 09/20/2019  ? Procedure: PACEMAKER LEADLESS INSERTION;  Surgeon:  Isaias Cowman, MD;  Location: [place] CV LAB;  Service: Cardiovascular;  Laterality: N/A;  ? PERIPHERAL VASCULAR CATHETERIZATION N/A 05/13/2015  ? Procedure: IVC Filter Removal;  Surgeon: Katha Cabal, MD;  Location: [place] CV LAB;  Service: Cardiovascular;  Laterality: N/A;  ? TEMPORARY PACEMAKER Right 09/20/2019  ? Procedure: TEMPORARY PACEMAKER;  Surgeon: Isaias Cowman, MD;  Location: [place] CV LAB;  Service: Cardiovascular;  Laterality: Right;  ? TONSILLECTOMY    ? ? ?Family History  ?Problem Relation Age of Onset  ? Breast cancer Sister   ?           ? ?Social History:  reports that she has quit smoking. She has never used smokeless tobacco. She reports that she does not drink alcohol and does not use drugs. ? ?Allergies  ?Allergen Reactions  ? Gentamicin Other (See Comments)  ?  Kidney function  ? Morphine And Related Other (See Comments)  ?  AMS - agitation and delusions **No legal documents to be signed if taking, per family**  ? ? ?HOME MEDICATIONS:                                                                                                                     ?Home medications list from [facility] as listed in [place] was reviewed. Medications include etoposide (chemotherapy for her ovarian cancer) and Eliquis. Of note Epic medications list was not up to date at the time of chart review.  ? ?ROS:                                                                                                                                       ?Denies any vision changes, limb weakness, dizziness, presyncope, CP or SOB. Other ROS as per HPI.   ? ?Blood pressure (!) 105/59, pulse 69, temperature 99.3 ?F (37.4 ?C), temperature source Oral, resp. rate 20, SpO2 96 %. ? ? ?General Examination:                                                                                                      ? ?  Physical Exam  ?HEENT-  [place]/AT    ?Lungs- Respirations unlabored ?Extremities- Warm and well perfused. Mild pitting edema to distal BLE.  ? ? ?Neurological Examination ?Mental Status: Awake and alert. Oriented x 5. Speech with intermittent subtle dysarthria as well as intermittent mild phonemic paraphasias. Intermittent speech hesitancy also noted. Naming intact. Able to follow all motor commands and answer questions about her recent symptoms and past medical history. Repetition intact on 1/2 trials. Good insight. Pleasant and cooperative. Mild concentration deficit: Able to name the months of the year in order rapidly without error but became confused when reaching May on attempts to recite them backwards.  ?Cranial Nerves: ?II: Visual fields intact bilaterally. No extinction to DSS. PERRL.   ?III,IV, VI: No ptosis. EOMI. No nystagmus.  ?V: Temp sensation equal bilaterally ?VII: Smile symmetric ?VIII: Hearing intact to voice ?IX,X: No hypophonia or hoarseness ?XI: Symmetric ?XII: Midline tongue extension ?Motor: ?Right : Upper extremity   5/5    Left:     Upper extremity   5/5 ? Lower extremity   5/5     Lower extremity   5/5 ?No pronator drift.  ?Subtle asterixis.  ?Sensory: Temp and light touch intact throughout, bilaterally. No extinction to  DSS ?Deep Tendon Reflexes: 2+ and symmetric biceps and brachioradialis. 0 bilateral patellae.  ?Plantars: Mute bilaterally  ?Cerebellar: No ataxia with FNF bilaterally  ?Gait: Deferred ? ?NIHSS: 2 ?  ?Lab Results: ?Basic Metabolic Panel: ?No results for input(s): NA, K, CL, CO2, GLUCOSE, BUN, CREATININE, CALCIUM, MG, PHOS in the last 168 hours. ? ?CBC: ?No results for input(s): WBC, NEUTROABS, HGB, HCT, MCV, PLT in the last 168 hours. ? ?Cardiac Enzymes: ?No results for input(s): CKTOTAL, CKMB, CKMBINDEX, TROPONINI in the last 168 hours. ? ?Lipid Panel: ?No results for input(s): CHOL, TRIG, HDL, CHOLHDL, VLDL, LDLCALC in the last 168 hours. ? ?Imaging: ?No results found. ? ? ?Assessment: 86 year old female with acute onset of slurred speech and RUE weakness ?1. Exam reveals mild speech deficit. NIHSS 2.  ?2. CT head: No evidence of acute infarction, hemorrhage, hydrocephalus, extra-axial collection or mass lesion/mass effect. Patchy low-density in the cerebral white matter attributed to chronic small vessel ischemia.  ?3. Elevated BUN and Cr with eGFR of 28. BNP elevated at 487. No leukocytosis on CBC. Influenza and Covid negative.  ?4. Stroke risk factors: History of DVT, proteinc C and S deficiency, cancer, DM and HTN  ?5. DDx for her presentation includes TIA/stroke and metabolic encephalopathy (subtle asterixis on exam). Symptoms not suggestive of a seizure.  ?6. Has a pacemaker. Daughter has been asked to retrieve the card for the pacer at home to assess from the model number whether or not MRI brain can be obtained.  ? ?Recommendations: ?1. HgbA1c, fasting lipid panel ?2. MRI/MRA of the brain without contrast if her pacemaker is MRI compatible (see above).  ?3. PT consult, OT consult, Speech consult ?4. Echocardiogram ?5. Carotid ultrasound ?6. Continue Eliquis  ?7. IVF ?8. Telemetry monitoring ?9. Frequent neuro checks ?10 NPO until passes stroke swallow screen ?11. BP management with permissive HTN x 24  hours.  ?12. Pharmacy to reconcile meds so that the Epic list is up to date.  ? ? ?Electronically signed: Dr. Kerney Elbe ?01/17/2022, 10:00 AM ? ? ?   ?

## 2022-01-17 NOTE — ED Provider Notes (Signed)
[facility] Provider Note    Event Date/Time   First MD Initiated Contact with Patient 01/17/22 [phone]     (approximate)   History   Code Stroke   HPI  Sierra [name] is a 86 y.o. female with past medical history of protein C&S deficiency on Eliquis, diabetes, here with weakness and speech difficulty.  Last known normal was last night.  Patient states that she felt normal this morning upon awakening, but is slightly confused with slurred speech, limiting history.  Per report from the facility, they checked on her this morning, they noted that she had a reported right facial droop and right-sided weakness.  Facial droop had resolved by EMS arrival, and patient was alert and speaking normally.  In route, she developed recurrent right-sided weakness as well as intermittent expressive aphasia.  This seems to have worsened just prior to arrival.  Patient has no history of strokes.  No history of seizures.  She is currently being treated for ovarian cancer, but has no known history of brain metastases.  She is on Eliquis and has been compliant with this.     Physical Exam   Triage Vital Signs: ED Triage Vitals  Enc Vitals Group     BP 01/17/22 0955 (!) 105/59     Pulse Rate 01/17/22 0955 69     Resp 01/17/22 0955 20     Temp 01/17/22 0955 99.3 F (37.4 C)     Temp Source 01/17/22 0955 Oral     SpO2 01/17/22 0955 96 %     Weight --      Height --      Head Circumference --      Peak Flow --      Pain Score 01/17/22 1001 0     Pain Loc --      Pain Edu? --      Excl. in [place]? --     Most recent vital signs: Vitals:   01/17/22 1232 01/17/22 1550  BP: 112/72 (!) 109/58  Pulse: 69 70  Resp: 18 18  Temp: 98.2 F (36.8 C) 98.5 F (36.9 C)  SpO2: 98% 95%     General: Awake, no distress.  CV:  Good peripheral perfusion.  Regular rate and rhythm. Resp:  Normal effort.  Lungs clear to auscultation bilaterally. Abd:  No distention.  No  tenderness. Other:  Cranial nerves II through XII currently intact.  Strength 4+ out of 5 on the right upper extremity, 5 out of 5 otherwise.  Mild pronator drift on the right.  Normal sensation to light touch is subjectively diminished on the right upper and lower extremity.   ED Results / Procedures / Treatments   Labs (all labs ordered are listed, but only abnormal results are displayed) Labs Reviewed  COMPREHENSIVE METABOLIC PANEL - Abnormal; Notable for the following components:      Result Value   Glucose, Bld 133 (*)    BUN 38 (*)    Creatinine, Ser 1.80 (*)    GFR, Estimated 28 (*)    Anion gap 4 (*)    All other components within normal limits  CBC WITH DIFFERENTIAL/PLATELET - Abnormal; Notable for the following components:   Hemoglobin 11.5 (*)    RDW 16.6 (*)    Platelets 124 (*)    All other components within normal limits  BRAIN NATRIURETIC PEPTIDE - Abnormal; Notable for the following components:   B Natriuretic Peptide 487.8 (*)    All other  components within normal limits  GLUCOSE, CAPILLARY - Abnormal; Notable for the following components:   Glucose-Capillary 168 (*)    All other components within normal limits  RESP PANEL BY RT-PCR (FLU A&B, COVID) ARPGX2  ETHANOL  PROTIME-INR  APTT  CBC  DIFFERENTIAL  URINE DRUG SCREEN, QUALITATIVE ([facility] ONLY)  URINALYSIS, ROUTINE W REFLEX MICROSCOPIC     EKG Suspect sinus rhythm but baseline artifact noted, VR 69. QRS 139, Qtc 476. No acute ST elevations. IVCD. No ST elevations.   RADIOLOGY CT Head: [place]   I also independently reviewed and agree wit radiologist interpretations.   PROCEDURES:  Critical Care performed: No  .1-3 Lead EKG Interpretation Performed by: Duffy Bruce, MD Authorized by: Duffy Bruce, MD     Interpretation: normal     ECG rate:  70-90   ECG rate assessment: normal     Rhythm: sinus rhythm     Ectopy: none     Conduction: normal   Comments:     Indication: stroke like  sx    MEDICATIONS ORDERED IN ED: Medications  ondansetron (ZOFRAN) injection 4 mg (has no administration in time range)  hydrALAZINE (APRESOLINE) injection 5 mg (has no administration in time range)  insulin aspart (novoLOG) injection 0-9 Units (2 Units Subcutaneous Given 01/17/22 1343)  insulin aspart (novoLOG) injection 0-5 Units (has no administration in time range)  0.9 %  sodium chloride infusion ( Intravenous New Bag/Given 01/17/22 1348)  acetaminophen (TYLENOL) tablet 650 mg (has no administration in time range)    Or  acetaminophen (TYLENOL) 160 MG/5ML solution 650 mg (has no administration in time range)    Or  acetaminophen (TYLENOL) suppository 650 mg (has no administration in time range)  senna-docusate (Senokot-S) tablet 1 tablet (has no administration in time range)  pregabalin (LYRICA) capsule 150 mg (150 mg Oral Given 01/17/22 1604)  calcium carbonate (OS-CAL - dosed in mg of elemental calcium) tablet 500 mg of elemental calcium (has no administration in time range)  cholecalciferol (VITAMIN D3) tablet 3,000 Units (has no administration in time range)  thiamine tablet 100 mg (100 mg Oral Given 01/17/22 1604)  mesalamine (PENTASA) CR capsule 1,500 mg (1,500 mg Oral Given 01/17/22 1604)  atorvastatin (LIPITOR) tablet 40 mg (40 mg Oral Given 01/17/22 1604)  sodium chloride 0.9 % bolus 1,000 mL (0 mLs Intravenous Stopped 01/17/22 1147)   stroke: mapping our early stages of recovery book ( Does not apply Given 01/17/22 1347)     IMPRESSION / MDM / ASSESSMENT AND PLAN / ED COURSE  I reviewed the triage vital signs and the nursing notes.                               The patient is on the cardiac monitor to evaluate for evidence of arrhythmia and/or significant heart rate changes.   Ddx:  Stroke, TIA, seizure, recrudescence of prior infarct in setting of UTI/PNA/dehydration, polypharmacy  MDM:  86 yo F with PMHx HTN, HLD, DVT, CHF, here with speech difficulty, transient  weakness. CT head negative, shows no acute fx or malalignment. Labs overall reassuring - no leukocytosis. CMP unremarkable. BNP slightly elevated at 487. EKG nonischemic. Pt noted to have intermittent slurred speech, R sided weakness here, waxing and waning. Dr. Cheral Marker of Neurology consulted and has seen pt. Will admit to hospitalist service.   MEDICATIONS GIVEN IN ED: Medications  ondansetron (ZOFRAN) injection 4 mg (has no administration in time range)  hydrALAZINE (APRESOLINE) injection 5 mg (has no administration in time range)  insulin aspart (novoLOG) injection 0-9 Units (2 Units Subcutaneous Given 01/17/22 1343)  insulin aspart (novoLOG) injection 0-5 Units (has no administration in time range)  0.9 %  sodium chloride infusion ( Intravenous New Bag/Given 01/17/22 1348)  acetaminophen (TYLENOL) tablet 650 mg (has no administration in time range)    Or  acetaminophen (TYLENOL) 160 MG/5ML solution 650 mg (has no administration in time range)    Or  acetaminophen (TYLENOL) suppository 650 mg (has no administration in time range)  senna-docusate (Senokot-S) tablet 1 tablet (has no administration in time range)  pregabalin (LYRICA) capsule 150 mg (150 mg Oral Given 01/17/22 1604)  calcium carbonate (OS-CAL - dosed in mg of elemental calcium) tablet 500 mg of elemental calcium (has no administration in time range)  cholecalciferol (VITAMIN D3) tablet 3,000 Units (has no administration in time range)  thiamine tablet 100 mg (100 mg Oral Given 01/17/22 1604)  mesalamine (PENTASA) CR capsule 1,500 mg (1,500 mg Oral Given 01/17/22 1604)  atorvastatin (LIPITOR) tablet 40 mg (40 mg Oral Given 01/17/22 1604)  sodium chloride 0.9 % bolus 1,000 mL (0 mLs Intravenous Stopped 01/17/22 1147)   stroke: mapping our early stages of recovery book ( Does not apply Given 01/17/22 1347)     Consults:  Neurology Dr. Cheral Marker, consulted and case discussed at bedside Hospitalist Consulted for admission   EMR  reviewed  OBGYN notes reviewed, metastatic yolk sac disease     FINAL CLINICAL IMPRESSION(S) / ED DIAGNOSES   Final diagnoses:  Stroke-like symptom     Rx / DC Orders   ED Discharge Orders     None        Note:  This document was prepared using Dragon voice recognition software and may include unintentional dictation errors.   Duffy Bruce, MD 01/17/22 [phone]

## 2022-01-17 NOTE — TOC Initial Note (Signed)
Transition of Care (TOC) - Initial/Assessment Note  ? ? ?Patient Details  ?Name: Sierra [name] ?MRN: [id number] ?Date of Birth: Apr 21, 1936 ? ?Transition of Care (TOC) CM/SW Contact:    ?Breely Panik E Rebacca [place], LCSW ?Phone Number: ?01/17/2022, 3:44 PM ? ?Clinical Narrative:                Spoke with patient and [facility]. ?Patient lives at [facility] in the long term SNF.  ?Patient has been using her wheelchair but also has a rollator.  ?PT is recommending home health PT.  ?Spoke with Seth Bake who reported they can provide therapy in their gym under Medicare A if patient has a 3 night stay or under Medicare B if she doesn't.  ? ? ?Expected Discharge Plan: [place] ?Barriers to Discharge: Continued Medical Work up ? ? ?Patient Goals and CMS Choice ?Patient states their goals for this hospitalization and ongoing recovery are:: go back to [facility] and get therapy there ?CMS Medicare.gov Compare Post Acute Care list provided to:: Patient ?Choice offered to / list presented to : Patient ? ?Expected Discharge Plan and Services ?Expected Discharge Plan: [place] ?  ?  ?  ?Living arrangements for the past 2 months: [place] ?                ?  ?  ?  ?  ?  ?  ?  ?  ?  ?  ? ?Prior Living Arrangements/Services ?Living arrangements for the past 2 months: [place] ?Lives with:: Facility Resident ?Patient language and need for interpreter reviewed:: Yes ?Do you feel safe going back to the place where you live?: Yes      ?Need for Family Participation in Patient Care: Yes (Comment) ?Care giver support system in place?: Yes (comment) ?  ?Criminal Activity/Legal Involvement Pertinent to Current Situation/Hospitalization: No - Comment as needed ? ?Activities of Daily Living ?Home Assistive Devices/Equipment: Wheelchair, Environmental consultant (specify type) ?ADL Screening (condition at time of admission) ?Patient's cognitive ability adequate to safely complete daily  activities?: Yes ?Is the patient deaf or have difficulty hearing?: No ?Does the patient have difficulty seeing, even when wearing glasses/contacts?: No ?Does the patient have difficulty concentrating, remembering, or making decisions?: No ?Patient able to express need for assistance with ADLs?: Yes ?Does the patient have difficulty dressing or bathing?: Yes ?Independently performs ADLs?: Yes (appropriate for developmental age) ?Does the patient have difficulty walking or climbing stairs?: Yes ?Weakness of Legs: Left ?Weakness of Arms/Hands: Right ? ?Permission Sought/Granted ?Permission sought to share information with : Customer service manager ?Permission granted to share information with : Yes, Verbal Permission Granted ?   ? Permission granted to share info w AGENCY: [place] ?   ?   ? ?Emotional Assessment ?  ?  ?  ?Orientation: : Oriented to Self, Oriented to Place, Oriented to  Time ?Alcohol / Substance Use: Not Applicable ?Psych Involvement: No (comment) ? ?Admission diagnosis:  TIA (transient ischemic attack) [G45.9] ?Patient Active Problem List  ? Diagnosis Date Noted  ? TIA (transient ischemic attack) 01/17/2022  ? Diabetes mellitus without complication ([place]) 50/27/7412  ? DVT (deep venous thrombosis) ([place]) 01/17/2022  ? HLD (hyperlipidemia) 01/17/2022  ? Chronic kidney disease, stage 3a ([place]) 01/17/2022  ? Ovarian cancer ([place]) 01/17/2022  ? Chronic diastolic CHF (congestive heart failure) ([place]) 01/17/2022  ? Chronic venous insufficiency 02/09/2020  ? Lymphedema 02/09/2020  ? Cardiac pacemaker 10/01/2019  ? Syncope 09/21/2019  ? Diabetes  mellitus, controlled ([place]) 09/21/2019  ? Complete heart block ([place]) 09/18/2019  ? Sepsis ([place]) 08/08/2019  ? UTI (urinary tract infection) 08/08/2019  ? Hypothyroidism 08/08/2019  ? HTN (hypertension) 08/08/2019  ? Diabetes ([place]) 08/08/2019  ? Protein C deficiency ([place]) 08/08/2019  ? Protein S deficiency ([place]) 08/08/2019  ? Left leg pain 08/06/2019  ? Hip  fracture ([place]) 01/11/2019  ? Lumbar radiculopathy 09/04/2018  ? Abnormal EKG 08/14/2018  ? Pre-op evaluation 08/14/2018  ? Anticoagulant long-term use 01/24/2018  ? Poorly-controlled hypertension 01/24/2018  ? Osteoporosis with current pathological fracture 09/16/2017  ? Post-menopausal osteoporosis 09/16/2017  ? CKD (chronic kidney disease) stage 2, GFR 60-89 ml/min 07/17/2017  ? Closed compression fracture of L1 vertebra ([place]) 10/22/2016  ? Closed fracture of left scapula 10/22/2016  ? Closed fracture of sacrum ([place]) 10/22/2016  ? Closed fracture of sternum 10/22/2016  ? Multiple rib fractures 10/22/2016  ? Trimalleolar fracture of ankle, closed, left, with routine healing, subsequent encounter 03/11/2015  ? Deep vein thrombosis (DVT) of right lower extremity ([place]) 11/19/2014  ? Irritable bowel syndrome with diarrhea 11/19/2014  ? Closed fracture of phalanx of foot 05/15/2013  ? Knee injury 01/11/2012  ? Osteopenia 09/18/2011  ? Hypercalcemia 04/12/2011  ? Acute thromboembolism of deep veins of lower extremity ([place]) 02/20/2010  ? Diabetic nephropathy ([place]) 02/20/2010  ? Mixed hyperlipidemia 02/20/2010  ? Vitamin D deficiency 02/20/2010  ? ?PCP:  Venia [place], MD ?Pharmacy:   ?[id number] [place]?Phone: [phone]: [phone]? ? ? ? ?Social Determinants of Health (SDOH) Interventions ?  ? ?Readmission Risk Interventions ?No flowsheet data found. ? ? ?

## 2022-01-17 NOTE — Evaluation (Signed)
Physical Therapy Evaluation ?Patient Details ?Name: Sierra [name] ?MRN: [id number] ?DOB: [birth year] ?Today's Date: 01/17/2022 ? ?History of Present Illness ? Patient is an 86 year old female who reported to [facility] with concerns of stroke like symptoms.  ?Clinical Impression ? Physical Therapy Evaluation completed on this date. Patient was agreeable to treatment and tolerated session well. No pain reported throughout session. Upon entry into room patient was on [facility] with RN and granddaughter present. Per patient she lives at [facility] and is Mod I with all mobility and ADLs. She states she has a rollator, however is not allowed to ambulate with it until cleared by her physical therapist. Patient reports she is wheelchair bound. Patient is currently participating in physical therapy at [facility].  ? ?Patient demonstrated fairly good strength during evaluation in BUEs at 4/5 strength. She demonstrated 3+to4/5 strength in BLEs.Sensation and coordination was intact. Patient was able to demonstrate Mod I with all bed mobility, and required supervision with RW for sit to stand transfers from EOB and during ambulation. Ambulation speed is decreased, however no LOB was noted. Patient would continue to benefit from skilled physical therapy in order to optimize patient's independence with ADLs and mobility. Will continue to see during acute stay to limit functional decline. Recommend patient pick up with HHPT upon discharge from acute hospitalization.  ?   ? ?Recommendations for follow up therapy are one component of a multi-disciplinary discharge planning process, led by the attending physician.  Recommendations may be updated based on patient status, additional functional criteria and insurance authorization. ? ?Follow Up Recommendations Home health PT ? ?  ?Assistance Recommended at Discharge Intermittent Supervision/Assistance  ?Patient can return home with the following ? A little help with walking and/or transfers ? ?   ?Equipment Recommendations None recommended by PT  ?Recommendations for Other Services ?    ?  ?Functional Status Assessment Patient has had a recent decline in their functional status and demonstrates the ability to make significant improvements in function in a reasonable and predictable amount of time.  ? ?  ?Precautions / Restrictions Precautions ?Precautions: Fall ?Restrictions ?Weight Bearing Restrictions: No  ? ?  ? ?Mobility ? Bed Mobility ?Overal bed mobility: Modified Independent ?  ?  ?  ?  ?  ?  ?  ?  ? ?Transfers ?Overall transfer level: Needs assistance ?Equipment used: Rolling walker (2 wheels) ?Transfers: Sit to/from Stand ?Sit to Stand: Supervision ?  ?  ?  ?  ?  ?  ?  ? ?Ambulation/Gait ?Ambulation/Gait assistance: Supervision ?Gait Distance (Feet): 180 Feet ?Assistive device: Rolling walker (2 wheels) ?Gait Pattern/deviations: Step-through pattern, Decreased step length - right, Decreased step length - left, Narrow base of support, Trunk flexed ?Gait velocity: decreased ?Gait velocity interpretation: <1.31 ft/sec, indicative of household ambulator ?  ?General Gait Details: no LOB noted ? ?Stairs ?  ?  ?  ?  ?  ? ?Wheelchair Mobility ?  ? ?Modified Rankin (Stroke Patients Only) ?  ? ?  ? ?Balance Overall balance assessment: Needs assistance ?Sitting-balance support: Bilateral upper extremity supported, Feet supported ?Sitting balance-Leahy Scale: Good ?  ?  ?Standing balance support: Bilateral upper extremity supported, Reliant on assistive device for balance, During functional activity ?Standing balance-Leahy Scale: Fair ?  ?  ?  ?  ?  ?  ?  ?  ?  ?  ?  ?  ?   ? ? ? ?Pertinent Vitals/Pain Pain Assessment ?Pain Assessment: No/denies pain  ? ? ?  Home Living Family/patient expects to be discharged to:: Assisted living ?  ?  ?  ?  ?  ?  ?  ?  ?Home Equipment: Rollator (4 wheels) ?Additional Comments: Mod I living at [facility]  ?  ?Prior Function Prior Level of Function : Independent/Modified  Independent ?  ?  ?  ?  ?  ?  ?Mobility Comments: WC bound, has a rollator ?ADLs Comments: timing of medication ?  ? ? ?Hand Dominance  ? Dominant Hand: Right ? ?  ?Extremity/Trunk Assessment  ? Upper Extremity Assessment ?Upper Extremity Assessment: Overall WFL for tasks assessed ?  ? ?Lower Extremity Assessment ?Lower Extremity Assessment: Generalized weakness;RLE deficits/detail;LLE deficits/detail (3+to4-/5 strength bilaterally) ?RLE Sensation: WNL ?RLE Coordination: WNL ?LLE Sensation: WNL ?LLE Coordination: WNL ?  ? ?   ?Communication  ? Communication: No difficulties  ?Cognition Arousal/Alertness: Awake/alert ?Behavior During Therapy: [facility] for tasks assessed/performed ?Overall Cognitive Status: Within Functional Limits for tasks assessed ?  ?  ?  ?  ?  ?  ?  ?  ?  ?  ?  ?  ?  ?  ?  ?  ?General Comments: A&Ox3- self, location, sitaution ?  ?  ? ?  ?General Comments   ? ?  ?Exercises Other Exercises ?Other Exercises: patient educated on role of PT in acute setting, fall risk, and d/c recommendations  ? ?Assessment/Plan  ?  ?PT Assessment Patient needs continued PT services  ?PT Problem List Decreased mobility;Decreased strength;Decreased balance ? ?   ?  ?PT Treatment Interventions DME instruction;Therapeutic activities;Therapeutic exercise;Gait training;Balance training   ? ?PT Goals (Current goals can be found in the Care Plan section)  ?Acute Rehab PT Goals ?Patient Stated Goal: to go home ?PT Goal Formulation: With patient ?Time For Goal Achievement: 01/31/22 ?Potential to Achieve Goals: Good ? ?  ?Frequency Min 2X/week ?  ? ? ?Co-evaluation   ?  ?  ?  ?  ? ? ?  ?AM-PAC PT "6 Clicks" Mobility  ?Outcome Measure Help needed turning from your back to your side while in a flat bed without using bedrails?: None ?Help needed moving from lying on your back to sitting on the side of a flat bed without using bedrails?: None ?Help needed moving to and from a bed to a chair (including a wheelchair)?: A Little ?Help  needed standing up from a chair using your arms (e.g., wheelchair or bedside chair)?: A Little ?Help needed to walk in hospital room?: A Little ?Help needed climbing 3-5 steps with a railing? : A Little ?6 Click Score: 20 ? ?  ?End of Session Equipment Utilized During Treatment: Gait belt ?Activity Tolerance: Patient tolerated treatment well ?Patient left: in bed;with call bell/phone within reach;with family/visitor present ?Nurse Communication: Mobility status ?PT Visit Diagnosis: Muscle weakness (generalized) (M62.81);Difficulty in walking, not elsewhere classified (R26.2) ?  ? ?Time: 5784-6962 ?PT Time Calculation (min) (ACUTE ONLY): 28 min ? ? ?Charges:   PT Evaluation ?$PT Eval Low Complexity: 1 Low ?PT Treatments ?$Gait Training: 8-22 mins ?  ?   ? ? ?Iva Boop, PT  ?01/17/22. 1:59 PM ? ? ?

## 2022-01-17 NOTE — Progress Notes (Addendum)
SLP Cancellation Note ? ?Patient Details ?Name: Sierra [name] ?MRN: [id number] ?DOB: [birth year] ? ? ?Cancelled treatment:       Reason Eval/Treat Not Completed: Other (comment)  ? ?SLP consult received and appreciated. Chart review completed. Attempted x2. Pt OTF initially (transferring to floor). Pt with MD on second attempt. Pt undergoing echocardiogram on third attempt. Further medical work up pending.  ? ?Will re-attempt evaluations next date. ? ?Will continue efforts as appropriate. ? ?Attempted: 9494, 4739 & 5844 ? ?Quintella Baton ?01/17/2022, 1:07 PM ?

## 2022-01-17 NOTE — ED Triage Notes (Signed)
Pt BIB [facility] from [facility]. Per EMS, initially called out for stroke like symptoms (RUE weakness and gait abnormality.) EMS did not activate code stroke d/t symptoms resolved. However, on arrival, pt with slurred speech and RUE drift. Code stroke activated during triage. Dr. Ellender Hose at bedside during triage. ? ?117/67 ?74 ?96 RA ?18 ?CBG 168 ?

## 2022-01-17 NOTE — [place] FL2 (Signed)
?[place] MEDICAID FL2 LEVEL OF CARE SCREENING TOOL  ?  ? ?IDENTIFICATION  ?Patient Name: ?Sierra [name] Birthdate: 1936/10/24 Sex: female Admission Date (Current Location): ?01/17/2022  ?[place] and [place] Number: ? [place] ?  Facility and Address:  ?[facility], [address], [place], [place] [address] ?     Provider Number: ?[id number]  ?Attending Physician Name and Address:  ?Ivor Costa, MD ? Relative Name and Phone Number:  ?  ?   ?Current Level of Care: ?Hospital Recommended Level of Care: ?[place] Prior Approval Number: ?  ? ?Date Approved/Denied: ?  PASRR Number: ?[id number] A ? ?Discharge Plan: ?  ?  ? ?Current Diagnoses: ?Patient Active Problem List  ? Diagnosis Date Noted  ? TIA (transient ischemic attack) 01/17/2022  ? Diabetes mellitus without complication ([place]) 82/42/3536  ? DVT (deep venous thrombosis) ([place]) 01/17/2022  ? HLD (hyperlipidemia) 01/17/2022  ? Chronic kidney disease, stage 3a ([place]) 01/17/2022  ? Ovarian cancer ([place]) 01/17/2022  ? Chronic diastolic CHF (congestive heart failure) ([place]) 01/17/2022  ? Chronic venous insufficiency 02/09/2020  ? Lymphedema 02/09/2020  ? Cardiac pacemaker 10/01/2019  ? Syncope 09/21/2019  ? Diabetes mellitus, controlled ([place]) 09/21/2019  ? Complete heart block ([place]) 09/18/2019  ? Sepsis ([place]) 08/08/2019  ? UTI (urinary tract infection) 08/08/2019  ? Hypothyroidism 08/08/2019  ? HTN (hypertension) 08/08/2019  ? Diabetes ([place]) 08/08/2019  ? Protein C deficiency ([place]) 08/08/2019  ? Protein S deficiency ([place]) 08/08/2019  ? Left leg pain 08/06/2019  ? Hip fracture ([place]) 01/11/2019  ? Lumbar radiculopathy 09/04/2018  ? Abnormal EKG 08/14/2018  ? Pre-op evaluation 08/14/2018  ? Anticoagulant long-term use 01/24/2018  ? Poorly-controlled hypertension 01/24/2018  ? Osteoporosis with current pathological fracture 09/16/2017  ? Post-menopausal osteoporosis 09/16/2017  ? CKD (chronic kidney disease) stage 2, GFR 60-89 ml/min  07/17/2017  ? Closed compression fracture of L1 vertebra ([place]) 10/22/2016  ? Closed fracture of left scapula 10/22/2016  ? Closed fracture of sacrum ([place]) 10/22/2016  ? Closed fracture of sternum 10/22/2016  ? Multiple rib fractures 10/22/2016  ? Trimalleolar fracture of ankle, closed, left, with routine healing, subsequent encounter 03/11/2015  ? Deep vein thrombosis (DVT) of right lower extremity ([place]) 11/19/2014  ? Irritable bowel syndrome with diarrhea 11/19/2014  ? Closed fracture of phalanx of foot 05/15/2013  ? Knee injury 01/11/2012  ? Osteopenia 09/18/2011  ? Hypercalcemia 04/12/2011  ? Acute thromboembolism of deep veins of lower extremity ([place]) 02/20/2010  ? Diabetic nephropathy ([place]) 02/20/2010  ? Mixed hyperlipidemia 02/20/2010  ? Vitamin D deficiency 02/20/2010  ? ? ?Orientation RESPIRATION BLADDER Height & Weight   ?  ?Self, Time, Situation, Place ? Normal   Weight:   ?Height:     ?BEHAVIORAL SYMPTOMS/MOOD NEUROLOGICAL BOWEL NUTRITION STATUS  ?         ?AMBULATORY STATUS COMMUNICATION OF NEEDS Skin   ?Limited Assist Verbally Normal ?  ?  ?  ?    ?     ?     ? ? ?Personal Care Assistance Level of Assistance  ?Bathing, Feeding, Dressing Bathing Assistance: Limited assistance ?Feeding assistance: Independent ?Dressing Assistance: Limited assistance ?   ? ?Functional Limitations Info  ?    ?  ?   ? ? ?SPECIAL CARE FACTORS FREQUENCY  ?PT (By licensed PT), OT (By licensed OT)   ?  ?PT Frequency: 5 times per week ?OT Frequency: 5 times per week ?  ?  ?  ?   ? ? ?Contractures    ? ? ?  Additional Factors Info  ?Code Status, Allergies Code Status Info: full ?Allergies Info: gentamicin, morphine and related ?  ?  ?  ?   ? ?Current Medications (01/17/2022):  This is the current hospital active medication list ?Current Facility-Administered Medications  ?Medication Dose Route Frequency Provider Last Rate Last Admin  ? 0.9 %  sodium chloride infusion   Intravenous Continuous Ivor Costa, MD 75 mL/hr at 01/17/22 1348  New Bag at 01/17/22 1348  ? acetaminophen (TYLENOL) tablet 650 mg  650 mg Oral Q4H PRN Ivor Costa, MD      ? Or  ? acetaminophen (TYLENOL) 160 MG/5ML solution 650 mg  650 mg Per Tube Q4H PRN Ivor Costa, MD      ? Or  ? acetaminophen (TYLENOL) suppository 650 mg  650 mg Rectal Q4H PRN Ivor Costa, MD      ? atorvastatin (LIPITOR) tablet 40 mg  40 mg Oral Daily Ivor Costa, MD      ? Derrill Memo ON 01/18/2022] calcium carbonate (OS-CAL - dosed in mg of elemental calcium) tablet 500 mg of elemental calcium  500 mg of elemental calcium Oral Q breakfast Ivor Costa, MD      ? Derrill Memo ON 01/18/2022] cholecalciferol (VITAMIN D3) tablet 3,000 Units  3,000 Units Oral Daily Ivor Costa, MD      ? hydrALAZINE (APRESOLINE) injection 5 mg  5 mg Intravenous Q2H PRN Ivor Costa, MD      ? insulin aspart (novoLOG) injection 0-5 Units  0-5 Units Subcutaneous QHS Ivor Costa, MD      ? insulin aspart (novoLOG) injection 0-9 Units  0-9 Units Subcutaneous TID WC Ivor Costa, MD   2 Units at 01/17/22 1343  ? mesalamine (PENTASA) CR capsule 1,500 mg  1,500 mg Oral Q1200 Ivor Costa, MD      ? ondansetron [facility]) injection 4 mg  4 mg Intravenous Q8H PRN Ivor Costa, MD      ? pregabalin (LYRICA) capsule 150 mg  150 mg Oral BID Ivor Costa, MD      ? senna-docusate (Senokot-S) tablet 1 tablet  1 tablet Oral QHS PRN Ivor Costa, MD      ? thiamine tablet 100 mg  100 mg Oral Daily Ivor Costa, MD      ? ? ? ?Discharge Medications: ?Please see discharge summary for a list of discharge medications. ? ?Relevant Imaging Results: ? ?Relevant Lab Results: ? ? ?Additional Information ?SS#[id number] ? ?Jaquel Glassburn E Marvion Bastidas, LCSW ? ? ? ? ?

## 2022-01-17 NOTE — ED Notes (Signed)
Jordan RN aware of assigned bed 

## 2022-01-17 NOTE — H&P (Signed)
History and Physical    Sierra [name] JSH:[id number] DOB: 1936/05/01 DOA: 01/17/2022  Referring MD/NP/PA:   PCP: Sierra Carbon, MD   Patient coming from:  The patient is coming from ALF  Chief Complaint: Right arm weakness, abnormal gait, slurred speech, right facial droop  HPI: Sierra [name] is a 86 y.o. 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  Pt was last known normal last night, and he developed right arm weakness, abnormal gait, slurred speech, right facial droop at about 815 this morning.  Symptoms have been gradually resolved at arrival to ED.  No hearing loss or vision loss.  Patient denies chest pain, shortness breath, cough.  No fever or chills.  No nausea, vomiting, diarrhea or abdominal pain.  No symptoms of UTI.  Data Reviewed and ED Course: pt was found to have WBC 7.9, negative COVID PCR, alcohol level less than 10, INR 1.2, PTT 29, renal function close to recent baseline, temperature 99.3, blood pressure 105/59, heart rate 69, RR 20, oxygen saturation 96% on room air.  CT head is negative for acute intracranial abnormalities.  Patient is placed on telemetry bed for observation, Dr. Cheral Marker of neurology is consulted.  EKG: I have personally reviewed.  Sinus rhythm, QTc 476, right axis deviation, paced rhythm   Review of Systems:   General: no fevers, chills, no body weight gain, has fatigue HEENT: no blurry vision, hearing changes or sore throat Respiratory: no dyspnea, coughing, wheezing CV: no chest pain, no palpitations GI: no nausea, vomiting, abdominal pain, diarrhea, constipation GU: no dysuria, burning on urination, increased urinary  frequency, hematuria  Ext: no leg edema Neuro: right arm weakness, abnormal gait, slurred speech, right facial droop Skin: no rash, no skin tear. MSK: No muscle spasm, no deformity, no limitation of range of movement in spin Heme: No easy bruising.  Travel history: No recent long distant travel.   Allergy:  Allergies  Allergen Reactions   Gentamicin Other (See Comments)    Kidney function   Morphine And Related Other (See Comments)    AMS - agitation and delusions **No legal documents to be signed if taking, per family**    Past Medical History:  Diagnosis Date   Anginal pain ([place])    Anxiety    Diabetes mellitus without complication ([place])    Hypertension    Hypothyroidism    Protein C deficiency ([place])    Protein S deficiency ([place])     Past Surgical History:  Procedure Laterality Date   ABDOMINAL SURGERY     BACK SURGERY     BREAST EXCISIONAL BIOPSY Left 2003?   benign   COLONOSCOPY N/A 09/12/2017   Procedure: COLONOSCOPY;  Surgeon: Lollie Sails, MD;  Location: [facility] ENDOSCOPY;  Service: Endoscopy;  Laterality: N/A;   COLONOSCOPY WITH PROPOFOL N/A 01/20/2016   Procedure: COLONOSCOPY WITH PROPOFOL;  Surgeon: Lollie Sails, MD;  Location: [facility] ENDOSCOPY;  Service: Endoscopy;  Laterality: N/A;   FRACTURE SURGERY     HIP ARTHROPLASTY Left 01/12/2019   Procedure: ARTHROPLASTY BIPOLAR HIP (HEMIARTHROPLASTY), LEFT;  Surgeon: Leim Fabry, MD;  Location: [facility] ORS;  Service: Orthopedics;  Laterality: Left;   PACEMAKER LEADLESS INSERTION N/A 09/20/2019   Procedure: PACEMAKER LEADLESS INSERTION;  Surgeon: Isaias Cowman, MD;  Location: [place] LAB;  Service: Cardiovascular;  Laterality: N/A;   PERIPHERAL VASCULAR CATHETERIZATION N/A 05/13/2015   Procedure: IVC Filter Removal;  Surgeon: Katha Cabal, MD;  Location: [place];  Service: Cardiovascular;  Laterality: N/A;   TEMPORARY PACEMAKER Right 09/20/2019   Procedure: TEMPORARY PACEMAKER;   Surgeon: Isaias Cowman, MD;  Location: [facility];  Service: Cardiovascular;  Laterality: Right;   TONSILLECTOMY      Social History:  reports that she has quit smoking. She has never used smokeless tobacco. She reports that she does not drink alcohol and does not use drugs.  Family History:  Family History  Problem Relation Age of Onset   Breast cancer Sister      Prior to Admission medications   Medication Sig Start Date End Date Taking? Authorizing Provider  APRISO 0.375 g 24 hr capsule Take 1.5 g by mouth daily at 12 noon.   Yes [provider]  atorvastatin (LIPITOR) 40 MG tablet Take 40 mg by mouth daily.   Yes [provider]  calcium carbonate (OSCAL) 1500 (600 Ca) MG TABS tablet Take 600 mg of elemental calcium by mouth daily.    Yes [provider]  cholecalciferol (VITAMIN D3) 25 MCG (1000 UT) tablet Take 3,000 Units by mouth daily. (taken with calcium)   Yes [provider]  glipiZIDE (GLUCOTROL XL) 2.5 MG 24 hr tablet Take 2.5 mg by mouth daily. 11/11/18  Yes [provider]  metFORMIN (GLUCOPHAGE) 1000 MG tablet Take 1 tablet by mouth at bedtime.   Yes [provider]  pregabalin (LYRICA) 150 MG capsule Take 150 mg by mouth 2 (two) times daily.   Yes [provider]  sitaGLIPtin (JANUVIA) 25 MG tablet Take 1 tablet by mouth daily.   Yes [provider]  thiamine 100 MG tablet Take 100 mg by mouth daily.   Yes [provider]  alendronate (FOSAMAX) 70 MG tablet Take 70 mg by mouth every Sunday. Patient not taking: Reported on 01/17/2022    [provider]  amitriptyline (ELAVIL) 10 MG tablet Take 10 mg by mouth at bedtime as needed for sleep. Patient not taking: Reported on 01/17/2022 12/08/18   [provider]  amLODipine (NORVASC) 2.5 MG tablet Take 2.5 mg by mouth daily. Patient not taking: Reported on 01/17/2022    [provider]  atenolol (TENORMIN) 25  MG tablet Take 25 mg by mouth daily. Patient not taking: Reported on 01/17/2022    [provider]  DULoxetine (CYMBALTA) 60 MG capsule Take 60 mg by mouth at bedtime.  Patient not taking: Reported on 01/17/2022    [provider]  furosemide (LASIX) 20 MG tablet Take 20 mg by mouth daily. Patient not taking: Reported on 01/17/2022    [provider]  levothyroxine (SYNTHROID, LEVOTHROID) 88 MCG tablet Take 88 mcg by mouth daily before breakfast. Patient not taking: Reported on 01/17/2022    [provider]  lisinopril (PRINIVIL,ZESTRIL) 40 MG tablet Take 40 mg by mouth daily. Patient not taking: Reported on 01/17/2022 12/05/18   [provider]  Probiotic Product ([place]) CAPS Take 1 capsule by mouth daily. Patient not taking: Reported on 01/17/2022    [provider]  saccharomyces boulardii (FLORASTOR) 250 MG capsule Take 250 mg by mouth daily. Patient not taking: Reported on 01/17/2022    [provider]  sitaGLIPtin-metformin (JANUMET) 50-500 MG per tablet Take 2 tablets by mouth every evening.  Patient  not taking: Reported on 01/17/2022    [provider]  vitamin B-12 (CYANOCOBALAMIN) 1000 MCG tablet Take 1,000 mcg by mouth daily. Patient not taking: Reported on 01/17/2022    [provider]  warfarin (COUMADIN) 5 MG tablet Take 5 mg by mouth every evening. Patient not taking: Reported on 01/17/2022    [provider]    Physical Exam: Vitals:   01/17/22 1100 01/17/22 1130 01/17/22 1232 01/17/22 1550  BP: 93/63 110/70 112/72 (!) 109/58  Pulse: 67 74 69 70  Resp: 13 (!) '22 18 18  '$ Temp:   98.2 F (36.8 C) 98.5 F (36.9 C)  TempSrc:      SpO2: 95% 95% 98% 95%  Weight:    88.8 kg  Height:    '5\' 7"'$  (1.702 m)   General: Not in acute distress HEENT:       Eyes: PERRL, EOMI, no scleral icterus.       ENT: No discharge from the ears and nose, no pharynx injection, no tonsillar  enlargement.        Neck: No JVD, no bruit, no mass felt. Heme: No neck lymph node enlargement. Cardiac: S1/S2, RRR, No murmurs, No gallops or rubs. Respiratory: No rales, wheezing, rhonchi or rubs. GI: Soft, nondistended, nontender, no rebound pain, no organomegaly, BS present. GU: No hematuria Ext: No pitting leg edema bilaterally. 1+DP/PT pulse bilaterally. Musculoskeletal: No joint deformities, No joint redness or warmth, no limitation of ROM in spin. Skin: No rashes.  Neuro: Symptoms has resolved.  Currently pt is alert, oriented X3, cranial nerves II-XII grossly intact, Muscle strength 5/5 in all extremities, sensation to light touch intact.  Psych: Patient is not psychotic, no suicidal or hemocidal ideation.  Labs on Admission: I have personally reviewed following labs and imaging studies  CBC: Recent Labs  Lab 01/17/22 1022  WBC 7.9  NEUTROABS 6.7  HGB 11.5*  HCT 37.4  MCV 94.7  PLT 825*   Basic Metabolic Panel: Recent Labs  Lab 01/17/22 1022  NA 140  K 4.6  CL 105  CO2 31  GLUCOSE 133*  BUN 38*  CREATININE 1.80*  CALCIUM 9.5   GFR: Estimated Creatinine Clearance: 27.1 mL/min (A) (by C-G formula based on SCr of 1.8 mg/dL (H)). Liver Function Tests: Recent Labs  Lab 01/17/22 1022  AST 19  ALT 11  ALKPHOS 42  BILITOT 0.9  PROT 6.7  ALBUMIN 3.7   No results for input(s): LIPASE, AMYLASE in the last 168 hours. No results for input(s): AMMONIA in the last 168 hours. Coagulation Profile: Recent Labs  Lab 01/17/22 1022  INR 1.2   Cardiac Enzymes: No results for input(s): CKTOTAL, CKMB, CKMBINDEX, TROPONINI in the last 168 hours. BNP (last 3 results) No results for input(s): PROBNP in the last 8760 hours. HbA1C: No results for input(s): HGBA1C in the last 72 hours. CBG: Recent Labs  Lab 01/17/22 1640  GLUCAP 168*   Lipid Profile: No results for input(s): CHOL, HDL, LDLCALC, TRIG, CHOLHDL, LDLDIRECT in the last 72 hours. Thyroid Function  Tests: No results for input(s): TSH, T4TOTAL, FREET4, T3FREE, THYROIDAB in the last 72 hours. Anemia Panel: No results for input(s): VITAMINB12, FOLATE, FERRITIN, TIBC, IRON, RETICCTPCT in the last 72 hours. Urine analysis:    Component Value Date/Time   COLORURINE AMBER (A) 09/18/2019 1805   APPEARANCEUR TURBID (A) 09/18/2019 1805   LABSPEC 1.021 09/18/2019 1805   PHURINE 7.0 09/18/2019 1805   GLUCOSEU NEGATIVE 09/18/2019 1805   HGBUR NEGATIVE 09/18/2019  [place] 09/18/2019 [place] 09/18/2019 1805   PROTEINUR 100 (A) 09/18/2019 1805   NITRITE NEGATIVE 09/18/2019 1805   LEUKOCYTESUR SMALL (A) 09/18/2019 1805   Sepsis Labs: '@LABRCNTIP'$ (procalcitonin:4,lacticidven:4) ) Recent Results (from the past 240 hour(s))  Resp Panel by RT-PCR (Flu A&B, Covid) Nasopharyngeal Swab     Status: None   Collection Time: 01/17/22 10:22 AM   Specimen: Nasopharyngeal Swab; Nasopharyngeal(NP) swabs in vial transport medium  Result Value Ref Range Status   SARS Coronavirus 2 by RT PCR NEGATIVE NEGATIVE Final    Comment: (NOTE) SARS-CoV-2 target nucleic acids are NOT DETECTED.  The SARS-CoV-2 RNA is generally detectable in upper respiratory specimens during the acute phase of infection. The lowest concentration of SARS-CoV-2 viral copies this assay can detect is 138 copies/mL. A negative result does not preclude SARS-Cov-2 infection and should not be used as the sole basis for treatment or other patient management decisions. A negative result may occur with  improper specimen collection/handling, submission of specimen other than nasopharyngeal swab, presence of viral mutation(s) within the areas targeted by this assay, and inadequate number of viral copies(<138 copies/mL). A negative result must be combined with clinical observations, patient history, and epidemiological information. The expected result is Negative.  Fact Sheet for Patients:   EntrepreneurPulse.com.au  Fact Sheet for Healthcare Providers:  IncredibleEmployment.be  This test is no t yet approved or cleared by the [place] FDA and  has been authorized for detection and/or diagnosis of SARS-CoV-2 by FDA under an Emergency Use Authorization (EUA). This EUA will remain  in effect (meaning this test can be used) for the duration of the COVID-19 declaration under Section 564(b)(1) of the Act, 21 U.S.C.section 360bbb-3(b)(1), unless the authorization is terminated  or revoked sooner.       Influenza A by PCR NEGATIVE NEGATIVE Final   Influenza B by PCR NEGATIVE NEGATIVE Final    Comment: (NOTE) The Xpert Xpress SARS-CoV-2/FLU/RSV plus assay is intended as an aid in the diagnosis of influenza from Nasopharyngeal swab specimens and should not be used as a sole basis for treatment. Nasal washings and aspirates are unacceptable for Xpert Xpress SARS-CoV-2/FLU/RSV testing.  Fact Sheet for Patients: EntrepreneurPulse.com.au  Fact Sheet for Healthcare Providers: IncredibleEmployment.be  This test is not yet approved or cleared by the [place] FDA and has been authorized for detection and/or diagnosis of SARS-CoV-2 by FDA under an Emergency Use Authorization (EUA). This EUA will remain in effect (meaning this test can be used) for the duration of the COVID-19 declaration under Section 564(b)(1) of the Act, 21 U.S.C. section 360bbb-3(b)(1), unless the authorization is terminated or revoked.  Performed at [facility], [address], [place], [place] [address]      Radiological Exams on Admission: CT HEAD CODE STROKE WO CONTRAST  Result Date: 01/17/2022 CLINICAL DATA:  Code stroke. Slurred speech and right-sided weakness EXAM: CT HEAD WITHOUT CONTRAST TECHNIQUE: Contiguous axial images were obtained from the base of the skull through the vertex without intravenous  contrast. RADIATION DOSE REDUCTION: This exam was performed according to the departmental dose-optimization program which includes automated exposure control, adjustment of the mA and/or kV according to patient size and/or use of iterative reconstruction technique. COMPARISON:  Head CT 12/26/2020 FINDINGS: Brain: No evidence of acute infarction, hemorrhage, hydrocephalus, extra-axial collection or mass lesion/mass effect. Patchy low-density in the cerebral white matter attributed to chronic small vessel ischemia. Vascular: No hyperdense vessel or unexpected calcification. Skull: Normal. Negative for  fracture or focal lesion. Sinuses/Orbits: No acute finding. Other: These results were communicated to Dr. Cheral Marker at 10:13 am on 01/17/2022 by text page via the [facility] messaging system. ASPECTS [facility] Stroke Program Early CT Score) - Ganglionic level infarction (caudate, lentiform nuclei, internal capsule, insula, M1-M3 cortex): 7 - Supraganglionic infarction (M4-M6 cortex): 3 Total score (0-10 with 10 being normal): 10 IMPRESSION: No acute finding. Electronically Signed   By: Jorje Guild M.D.   On: 01/17/2022 10:12      Assessment/Plan Principal Problem:   TIA (transient ischemic attack) Active Problems:   Hypothyroidism   HTN (hypertension)   Protein C deficiency ([name])   Protein S deficiency ([name])   Diabetes mellitus without complication ([place])   DVT (deep venous thrombosis) ([name])   HLD (hyperlipidemia)   Chronic kidney disease, stage 3a ([name])   Ovarian cancer ([name])   Chronic diastolic CHF (congestive heart failure) ([name])   TIA (transient ischemic attack): pt had symptoms including right arm weakness, abnormal gait, slurred speech, right facial droop, which has resolved.  Concerning for possible TIA.  CT head negative for acute intracranial abnormalities.  Patient has pacemaker, cannot do MRI for brain.  Consulted Dr. Cheral Marker of neurology.  Patient has CKD stage 3a, not a good candidate to do CT  angiogram of head and neck.  - will place on tele bed for obs - will follow up Neurology's Recs.  - will hold oral Bp meds to allow permissive HTN in the setting of acute stroke  - Check carotid dopplers  - Continue Eliquis and lipitor - fasting lipid panel and HbA1c  - 2D transthoracic echocardiography  - swallowing screen. If fails, will get SLP - PT/OT consult  Hypothyroidism -Synthroid  HTN (hypertension) -Hold blood pressure medications including Lasix, lisinopril, atenolol, amlodipine to allow permissive hypertension -IV hydralazine as needed for SBP >220, or DBP >120  Hx of Protein C deficiency and Protein S deficiency ([name]) -Eliquis  Diabetes mellitus without complication ([place]): Recent A1c 6.7, well controlled.  Patient is taking metformin, Januvia at home -SSI  DVT (deep venous thrombosis) ([name]) -Eliquis  HLD (hyperlipidemia) -Lipitor  Chronic kidney disease, stage 3a ([place]): Recent baseline creatinine 1.4-2.6 (2.6 on 12/26/2020).  Her creatinine is 1.80, BUN 38. -IV fluid: 1 L normal saline, then 75 cc/h due to softer blood pressure  Ovarian cancer ([name]) nausea: Patient is etoposide.  Hospital does not carry these medications.  Patient daughter will bring this medication to hospital  Chronic diastolic CHF (congestive heart failure) ([place]): 2D echo 09/19/2019 showed EF of 60 to 65% with grade 1 diastolic dysfunction.  Patient does not have leg edema or JVD.  CHF compensated. -Watch volume status closely     DVT ppx: on Eliquis  Code Status: DNR per pt and her granddaughter  Family Communication:  Yes, patient's granddaughter   at bed side.    Disposition Plan:  Anticipate discharge back to previous environment, ALF  Consults called: Dr. Cheral Marker of neurology  Admission status and Level of care: Telemetry Medical:  for obs     Severity of Illness:  The appropriate patient status for this patient is OBSERVATION. Observation status is judged to be  reasonable and necessary in order to provide the required intensity of service to ensure the patient's safety. The patient's presenting symptoms, physical exam findings, and initial radiographic and laboratory data in the context of their medical condition is felt to place them at decreased risk for further clinical deterioration. Furthermore, it is anticipated that  the patient will be medically stable for discharge from the hospital within 2 midnights of admission.        Date of Service 01/17/2022    Ivor Costa [facility]   If 7PM-7AM, please contact night-coverage [email or web address] 01/17/2022, 5:47 PM

## 2022-01-18 ENCOUNTER — Observation Stay: Payer: Medicare Other

## 2022-01-18 DIAGNOSIS — G459 Transient cerebral ischemic attack, unspecified: Secondary | ICD-10-CM | POA: Diagnosis not present

## 2022-01-18 LAB — BASIC METABOLIC PANEL
Anion gap: 6 (ref 5–15)
BUN: 39 mg/dL — ABNORMAL HIGH (ref 8–23)
CO2: 26 mmol/L (ref 22–32)
Calcium: 8.7 mg/dL — ABNORMAL LOW (ref 8.9–10.3)
Chloride: 107 mmol/L (ref 98–111)
Creatinine, Ser: 1.6 mg/dL — ABNORMAL HIGH (ref 0.44–1.00)
GFR, Estimated: 32 mL/min — ABNORMAL LOW (ref >60)
Glucose, Bld: 155 mg/dL — ABNORMAL HIGH (ref 70–99)
Potassium: 4.3 mmol/L (ref 3.5–5.1)
Sodium: 139 mmol/L (ref 135–145)

## 2022-01-18 LAB — LIPID PANEL
Cholesterol: 130 mg/dL (ref 0–200)
HDL: 41 mg/dL (ref >40)
LDL Cholesterol: 73 mg/dL (ref 0–99)
Total CHOL/HDL Ratio: 3.2 RATIO
Triglycerides: 82 mg/dL (ref <150)
VLDL: 16 mg/dL (ref 0–40)

## 2022-01-18 LAB — GLUCOSE, CAPILLARY
Glucose-Capillary: 156 mg/dL — ABNORMAL HIGH (ref 70–99)
Glucose-Capillary: 118 mg/dL — ABNORMAL HIGH (ref 70–99)

## 2022-01-18 NOTE — Discharge Summary (Signed)
Physician Discharge Summary   Patient: Sierra [name] MRN: [id number] DOB: 09/21/1936  Admit date:     01/17/2022  Discharge date: 01/18/22  Discharge Physician: Lorella Nimrod   PCP: Venia Carbon, MD   Recommendations at discharge:  Follow-up with primary care provider in 1 week Follow-up with cardiology in 1 to 2 weeks Follow-up with your oncologist according to your scheduled appointments  Discharge Diagnoses: Principal Problem:   TIA (transient ischemic attack) Active Problems:   Hypothyroidism   HTN (hypertension)   Protein C deficiency ([place])   Protein S deficiency ([place])   Diabetes mellitus without complication ([place])   DVT (deep venous thrombosis) ([place])   HLD (hyperlipidemia)   Chronic kidney disease, stage 3a ([place])   Ovarian cancer ([place])   Chronic diastolic CHF (congestive heart failure) [facility])   Hospital Course: Sierra [name] is a 86 y.o. 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   Pt was last known normal last night, and he developed right arm weakness, abnormal gait, slurred speech, right facial droop at about 815 this morning.  Symptoms have been gradually resolved at arrival to ED.  No hearing loss or vision loss.  Patient denies chest pain, shortness breath, cough.  No fever or chills.  No nausea, vomiting, diarrhea or abdominal pain.  No symptoms of UTI.  Her symptoms resolved in the ED.  Neurology evaluated her.  All imaging which include 2 CT heads, 24-hour apart was negative for any acute changes.  Unable to obtain MRI as [facility] cannot do MRI in patients with pacemaker even with compatible pacemakers. Carotid ultrasound was without any  significant stenosis.  There was some concern of mitral regurgitation on 1 image of echocardiogram, not very conclusive.  She can follow-up with her cardiologist in 1 to 2 weeks for further recommendations.  Patient lives at a long-term facility of [facility] and is being discharged back to her facility.  She will continue her current medications and follow-up with her providers.   Consultants: Neurology Procedures performed: None Disposition: Nursing home Diet recommendation:  Discharge Diet Orders (From admission, onward)     Start     Ordered   01/18/22 0000  Diet - low sodium heart healthy        01/18/22 1113           Cardiac and Carb modified diet DISCHARGE MEDICATION: Allergies as of 01/18/2022       Reactions   Gentamicin Other (See Comments)   Kidney function   Morphine And Related Other (See Comments)   AMS - agitation and delusions **No legal documents to be signed if taking, per family**        Medication List     STOP taking these medications    amitriptyline 10 MG tablet Commonly known as: ELAVIL   amLODipine 2.5 MG tablet Commonly known as: NORVASC   atenolol 25 MG tablet Commonly known as: TENORMIN   lisinopril 40 MG tablet Commonly known as: ZESTRIL   Designer, multimedia   saccharomyces boulardii 250 MG capsule Commonly known as: FLORASTOR   sitaGLIPtin-metformin 50-500 MG tablet Commonly known as: JANUMET   warfarin 5 MG tablet Commonly known as: COUMADIN       TAKE these medications  acetaminophen 325 MG tablet Commonly known as: TYLENOL Take 650 mg by mouth every 6 (six) hours as needed for moderate pain or mild pain (pain).   alendronate 70 MG tablet Commonly known as: FOSAMAX Take 70 mg by mouth every Sunday.   Apriso 0.375 g 24 hr capsule Generic drug: mesalamine Take 1.5 g by mouth daily at 12 noon.   atorvastatin 40 MG tablet Commonly known as: LIPITOR Take 40 mg by mouth daily.   calcium carbonate  1500 (600 Ca) MG Tabs tablet Commonly known as: OSCAL Take 600 mg of elemental calcium by mouth daily.   calcium-vitamin D 500-5 MG-MCG tablet Commonly known as: OSCAL WITH D Take 1 tablet by mouth 2 (two) times daily.   cholecalciferol 25 MCG (1000 UNIT) tablet Commonly known as: VITAMIN D3 Take 2,000 Units by mouth daily. (taken with calcium)   DULoxetine 30 MG capsule Commonly known as: CYMBALTA Take 30 mg by mouth daily. What changed: Another medication with the same name was removed. Continue taking this medication, and follow the directions you see here.   Eliquis 2.5 MG Tabs tablet Generic drug: apixaban Take 2.5 mg by mouth 2 (two) times daily.   etoposide 50 MG capsule Commonly known as: VEPESID Take 50 mg by mouth at bedtime. Last dose will 01/26/22; 14 days total   furosemide 20 MG tablet Commonly known as: LASIX Take 20 mg by mouth daily.   glipiZIDE 2.5 MG 24 hr tablet Commonly known as: GLUCOTROL XL Take 2.5 mg by mouth daily.   levothyroxine 75 MCG tablet Commonly known as: SYNTHROID Take 75 mcg by mouth daily before breakfast. What changed: Another medication with the same name was removed. Continue taking this medication, and follow the directions you see here.   melatonin 3 MG Tabs tablet Take 3 mg by mouth at bedtime.   metFORMIN 1000 MG tablet Commonly known as: GLUCOPHAGE Take 1 tablet by mouth daily with supper.   ondansetron 4 MG tablet Commonly known as: ZOFRAN Take 4 mg by mouth every 8 (eight) hours as needed for nausea or vomiting.   pregabalin 150 MG capsule Commonly known as: LYRICA Take 150 mg by mouth 2 (two) times daily.   sitaGLIPtin 25 MG tablet Commonly known as: JANUVIA Take 1 tablet by mouth daily.   thiamine 100 MG tablet Take 100 mg by mouth daily.   vitamin B-12 1000 MCG tablet Commonly known as: CYANOCOBALAMIN Take 1,000 mcg by mouth daily.   Voltaren 1 % Gel Generic drug: diclofenac Sodium Apply 2 g topically 3  (three) times daily as needed.        Follow-up Information     Venia Carbon, MD. Schedule an appointment as soon as possible for a visit in 1 week(s).   Specialties: Internal Medicine, Pediatrics Contact information: [place] [address] [phone]                Discharge Exam: Danley Danker Weights   01/17/22 1550  Weight: 88.8 kg   General.     In no acute distress. Pulmonary.  Lungs clear bilaterally, normal respiratory effort. CV.  Regular rate and rhythm, no JVD, rub or murmur. Abdomen.  Soft, nontender, nondistended, BS positive. CNS.  Alert and oriented .  No focal neurologic deficit. Extremities.  No edema, no cyanosis, pulses intact and symmetrical. Psychiatry.  Judgment and insight appears normal.   Condition at discharge: stable  The results of significant diagnostics from this hospitalization (including imaging, microbiology,  ancillary and laboratory) are listed below for reference.   Imaging Studies: CT HEAD WO CONTRAST (5MM)  Result Date: 01/18/2022 CLINICAL DATA:  Follow-up stroke, speech deficit, RIGHT-sided weakness EXAM: CT HEAD WITHOUT CONTRAST TECHNIQUE: Contiguous axial images were obtained from the base of the skull through the vertex without intravenous contrast. RADIATION DOSE REDUCTION: This exam was performed according to the departmental dose-optimization program which includes automated exposure control, adjustment of the mA and/or kV according to patient size and/or use of iterative reconstruction technique. COMPARISON:  01/17/2022 FINDINGS: Brain: Generalized atrophy. Normal ventricular morphology. No midline shift or mass effect. Small vessel chronic ischemic changes of deep cerebral white matter. No intracranial hemorrhage, mass lesion, or evidence of acute infarction. No extra-axial fluid collections. Vascular: No extra-axial fluid collections. Skull: Intact with mild hyperostosis frontalis interna Sinuses/Orbits:  Clear Other: N/A IMPRESSION: Atrophy with small vessel chronic ischemic changes of deep cerebral white matter. No acute intracranial abnormalities. Electronically Signed   By: Lavonia Dana M.D.   On: 01/18/2022 10:17   US Carotid Bilateral  Result Date: 01/18/2022 CLINICAL DATA:  Transient ischemic attack EXAM: BILATERAL CAROTID DUPLEX ULTRASOUND TECHNIQUE: Pearline Cables scale imaging, color Doppler and duplex ultrasound were performed of bilateral carotid and vertebral arteries in the neck. COMPARISON:  None. FINDINGS: Criteria: Quantification of carotid stenosis is based on velocity parameters that correlate the residual internal carotid diameter with NASCET-based stenosis levels, using the diameter of the distal internal carotid lumen as the denominator for stenosis measurement. The following velocity measurements were obtained: RIGHT ICA: 109/34 cm/sec CCA: 49/67 cm/sec SYSTOLIC ICA/CCA RATIO:  2.0 ECA:  90 cm/sec LEFT ICA: 78/28 cm/sec CCA: 59/16 cm/sec SYSTOLIC ICA/CCA RATIO:  1.6 ECA:  91 cm/sec RIGHT CAROTID ARTERY: Mild heterogeneous atherosclerotic plaque in the proximal internal carotid artery. By peak systolic velocity criteria, the estimated stenosis is less than 50%. RIGHT VERTEBRAL ARTERY:  Patent with normal antegrade flow. LEFT CAROTID ARTERY: Tortuous common carotid artery. Mild smooth heterogeneous atherosclerotic plaque in the proximal internal carotid artery. By peak systolic velocity criteria, the estimated stenosis is less than 50%. LEFT VERTEBRAL ARTERY:  Patent with normal antegrade flow. IMPRESSION: 1. Mild (1-49%) stenosis proximal right internal carotid artery secondary to mild smooth heterogeneous atherosclerotic plaque. 2. Mild (1-49%) stenosis proximal left internal carotid artery secondary to trace smooth heterogeneous atherosclerotic plaque. 3. Vertebral arteries are patent with normal antegrade flow. Signed, Criselda Peaches, MD, [facility]  [facility] Electronically Signed   By: Jacqulynn Cadet M.D.   On: 01/18/2022 07:46   ECHOCARDIOGRAM COMPLETE  Result Date: 01/17/2022    ECHOCARDIOGRAM REPORT   Patient Name:   [facility] Date of Exam: 01/17/2022 Medical Rec #:  [id number]    Height:       67.0 in Accession #:    [id number]   Weight:       195.1 lb Date of Birth:  1936/06/08    BSA:          2.001 m Patient Age:    5 years     BP:           112/72 mmHg Patient Gender: F            HR:           71 bpm. Exam Location:  [facility] Procedure: 2D Echo Indications:     TIA G45.9  History:         Patient has prior history of Echocardiogram examinations, most  recent 09/19/2019.  Sonographer:     Kathlen Brunswick RDCS Referring Phys:  Unknown Foley NIU Diagnosing Phys: Donnelly Angelica IMPRESSIONS  1. Left ventricular ejection fraction, by estimation, is >75%. The left ventricle has hyperdynamic function. The left ventricle has no regional wall motion abnormalities. There is mild asymmetric left ventricular hypertrophy of the septal segment. Left ventricular diastolic parameters are consistent with Grade II diastolic dysfunction (pseudonormalization).  2. Right ventricular systolic function is normal. The right ventricular size is normal.  3. Left atrial size was mildly dilated.  4. A single view (parasternal long axis) suggests possibly severe MR (? SAM), but the image quality is poor and is not reproduced in other views. The mitral valve is normal in structure. Mild mitral valve regurgitation.  5. LVOT velocities were collected using continuous wave doppler, so it is unknown if these are truly elevated velocities or there is signal from MR/ aortic vlave artificially increasing the signal. Possible LVOT Gradient. Did not adequately assess for AV stenosis, but it appears to open normally. The aortic valve is normal in structure. Aortic valve regurgitation is not visualized. FINDINGS  Left Ventricle: Unable to accurately estimate LVH d/t  off axis parasternal long axis view. Left ventricular ejection fraction, by estimation, is >75%. The left ventricle has hyperdynamic function. The left ventricle has no regional wall motion abnormalities. The left ventricular internal cavity size was small. There is mild asymmetric left ventricular hypertrophy of the septal segment. Left ventricular diastolic parameters are consistent with Grade II diastolic dysfunction (pseudonormalization). Right Ventricle: The right ventricular size is normal. Right vetricular wall thickness was not well visualized. Right ventricular systolic function is normal. Left Atrium: Left atrial size was mildly dilated. Right Atrium: Right atrial size was normal in size. Pericardium: There is no evidence of pericardial effusion. Mitral Valve: A single view (parasternal long axis) suggests possibly severe MR (? SAM), but the image quality is poor and is not reproduced in other views. The mitral valve is normal in structure. Mild mitral valve regurgitation. Tricuspid Valve: The tricuspid valve is grossly normal. Tricuspid valve regurgitation is mild . No evidence of tricuspid stenosis. Aortic Valve: LVOT velocities were collected using continuous wave doppler, so it is unknown if these are truly elevated velocities or there is signal from MR/ aortic vlave artificially increasing the signal. Possible LVOT Gradient. Did not adequately assess for AV stenosis, but it appears to open normally. The aortic valve is normal in structure. Aortic valve regurgitation is not visualized. Pulmonic Valve: The pulmonic valve was not well visualized. Pulmonic valve regurgitation is not visualized. No evidence of pulmonic stenosis. Aorta: The aortic root is normal in size and structure. IAS/Shunts: No atrial level shunt detected by color flow Doppler. Additional Comments: A device lead is visualized.  LEFT VENTRICLE PLAX 2D LVIDd:         3.90 cm Diastology LVIDs:         2.80 cm LV e' medial:    16.60 cm/s  LV PW:         1.40 cm LV E/e' medial:  6.1 LV IVS:        1.60 cm LV e' lateral:   10.00 cm/s                        LV E/e' lateral: 10.1  RIGHT VENTRICLE RV Basal diam:  2.80 cm RV S prime:     13.70 cm/s TAPSE (M-mode): 2.3 cm LEFT ATRIUM  Index        RIGHT ATRIUM           Index LA diam:        3.90 cm 1.95 cm/m   RA Area:     10.80 cm LA Vol (A2C):   94.9 ml 47.42 ml/m  RA Volume:   20.30 ml  10.14 ml/m LA Vol (A4C):   67.5 ml 33.73 ml/m LA Biplane Vol: 82.9 ml 41.43 ml/m                         PULMONIC VALVE AORTA                  PV Vmax:       1.24 m/s Ao Sinus diam: 3.09 cm PV Peak grad:  6.2 mmHg  MITRAL VALVE                TRICUSPID VALVE MV Area (PHT): 3.36 cm     TV Peak grad:   35.8 mmHg MV Decel Time: 226 msec     TV Vmax:        2.99 m/s MV E velocity: 101.00 cm/s  TR Peak grad:   41.0 mmHg MV A velocity: 33.80 cm/s   TR Vmax:        320.00 cm/s MV E/A ratio:  2.99 Donnelly Angelica Electronically signed by Donnelly Angelica Signature Date/Time: 01/17/2022/6:37:45 PM    Final    CT HEAD CODE STROKE WO CONTRAST  Result Date: 01/17/2022 CLINICAL DATA:  Code stroke. Slurred speech and right-sided weakness EXAM: CT HEAD WITHOUT CONTRAST TECHNIQUE: Contiguous axial images were obtained from the base of the skull through the vertex without intravenous contrast. RADIATION DOSE REDUCTION: This exam was performed according to the departmental dose-optimization program which includes automated exposure control, adjustment of the mA and/or kV according to patient size and/or use of iterative reconstruction technique. COMPARISON:  Head CT 12/26/2020 FINDINGS: Brain: No evidence of acute infarction, hemorrhage, hydrocephalus, extra-axial collection or mass lesion/mass effect. Patchy low-density in the cerebral white matter attributed to chronic small vessel ischemia. Vascular: No hyperdense vessel or unexpected calcification. Skull: Normal. Negative for fracture or focal lesion. Sinuses/Orbits: No  acute finding. Other: These results were communicated to Dr. Cheral Marker at 10:13 am on 01/17/2022 by text page via the [facility] messaging system. ASPECTS [facility] Stroke Program Early CT Score) - Ganglionic level infarction (caudate, lentiform nuclei, internal capsule, insula, M1-M3 cortex): 7 - Supraganglionic infarction (M4-M6 cortex): 3 Total score (0-10 with 10 being normal): 10 IMPRESSION: No acute finding. Electronically Signed   By: Jorje Guild M.D.   On: 01/17/2022 10:12    Microbiology: Results for orders placed or performed during the hospital encounter of 01/17/22  Resp Panel by RT-PCR (Flu A&B, Covid) Nasopharyngeal Swab     Status: None   Collection Time: 01/17/22 10:22 AM   Specimen: Nasopharyngeal Swab; Nasopharyngeal(NP) swabs in vial transport medium  Result Value Ref Range Status   SARS Coronavirus 2 by RT PCR NEGATIVE NEGATIVE Final    Comment: (NOTE) SARS-CoV-2 target nucleic acids are NOT DETECTED.  The SARS-CoV-2 RNA is generally detectable in upper respiratory specimens during the acute phase of infection. The lowest concentration of SARS-CoV-2 viral copies this assay can detect is 138 copies/mL. A negative result does not preclude SARS-Cov-2 infection and should not be used as the sole basis for treatment or other patient management decisions. A negative result may occur with  improper specimen  collection/handling, submission of specimen other than nasopharyngeal swab, presence of viral mutation(s) within the areas targeted by this assay, and inadequate number of viral copies(<138 copies/mL). A negative result must be combined with clinical observations, patient history, and epidemiological information. The expected result is Negative.  Fact Sheet for Patients:  EntrepreneurPulse.com.au  Fact Sheet for Healthcare Providers:  IncredibleEmployment.be  This test is no t yet approved or cleared by the [place] FDA and  has been  authorized for detection and/or diagnosis of SARS-CoV-2 by FDA under an Emergency Use Authorization (EUA). This EUA will remain  in effect (meaning this test can be used) for the duration of the COVID-19 declaration under Section 564(b)(1) of the Act, 21 U.S.C.section 360bbb-3(b)(1), unless the authorization is terminated  or revoked sooner.       Influenza A by PCR NEGATIVE NEGATIVE Final   Influenza B by PCR NEGATIVE NEGATIVE Final    Comment: (NOTE) The Xpert Xpress SARS-CoV-2/FLU/RSV plus assay is intended as an aid in the diagnosis of influenza from Nasopharyngeal swab specimens and should not be used as a sole basis for treatment. Nasal washings and aspirates are unacceptable for Xpert Xpress SARS-CoV-2/FLU/RSV testing.  Fact Sheet for Patients: EntrepreneurPulse.com.au  Fact Sheet for Healthcare Providers: IncredibleEmployment.be  This test is not yet approved or cleared by the [place] FDA and has been authorized for detection and/or diagnosis of SARS-CoV-2 by FDA under an Emergency Use Authorization (EUA). This EUA will remain in effect (meaning this test can be used) for the duration of the COVID-19 declaration under Section 564(b)(1) of the Act, 21 U.S.C. section 360bbb-3(b)(1), unless the authorization is terminated or revoked.  Performed at [facility], [address] [place], [place] [address]     Labs: CBC: Recent Labs  Lab 01/17/22 1022  WBC 7.9  NEUTROABS 6.7  HGB 11.5*  HCT 37.4  MCV 94.7  PLT 712*   Basic Metabolic Panel: Recent Labs  Lab 01/17/22 1022 01/18/22 0555  NA 140 139  K 4.6 4.3  CL 105 107  CO2 31 26  GLUCOSE 133* 155*  BUN 38* 39*  CREATININE 1.80* 1.60*  CALCIUM 9.5 8.7*   Liver Function Tests: Recent Labs  Lab 01/17/22 1022  AST 19  ALT 11  ALKPHOS 42  BILITOT 0.9  PROT 6.7  ALBUMIN 3.7   CBG: Recent Labs  Lab 01/17/22 1640 01/17/22 2055 01/18/22 0746   GLUCAP 168* 137* 156*    Discharge time spent: greater than 30 minutes.  This record has been created using Systems analyst. Errors have been sought and corrected,but may not always be located. Such creation errors do not reflect on the standard of care.   Signed: Lorella Nimrod, MD [facility] 01/18/2022

## 2022-01-18 NOTE — Evaluation (Signed)
Speech Language Pathology Evaluation Patient Details Name: Sierra [name] MRN: [id number] DOB: Mar 11, 1936 Today's Date: 01/18/2022 Time: 1050-1105 SLP Time Calculation (min) (ACUTE ONLY): 15 min  Problem List:  Patient Active Problem List   Diagnosis Date Noted   TIA (transient ischemic attack) 01/17/2022   Diabetes mellitus without complication ([place]) 70/35/0093   DVT (deep venous thrombosis) ([place]) 01/17/2022   HLD (hyperlipidemia) 01/17/2022   Chronic kidney disease, stage 3a ([place]) 01/17/2022   Ovarian cancer ([place]) 01/17/2022   Chronic diastolic CHF (congestive heart failure) ([place]) 01/17/2022   Chronic venous insufficiency 02/09/2020   Lymphedema 02/09/2020   Cardiac pacemaker 10/01/2019   Syncope 09/21/2019   Diabetes mellitus, controlled ([place]) 09/21/2019   Complete heart block ([place]) 09/18/2019   Sepsis ([place]) 08/08/2019   UTI (urinary tract infection) 08/08/2019   Hypothyroidism 08/08/2019   HTN (hypertension) 08/08/2019   Diabetes ([place]) 08/08/2019   Protein C deficiency ([place]) 08/08/2019   Protein S deficiency ([place]) 08/08/2019   Left leg pain 08/06/2019   Hip fracture ([place]) 01/11/2019   Lumbar radiculopathy 09/04/2018   Abnormal EKG 08/14/2018   Pre-op evaluation 08/14/2018   Anticoagulant long-term use 01/24/2018   Poorly-controlled hypertension 01/24/2018   Osteoporosis with current pathological fracture 09/16/2017   Post-menopausal osteoporosis 09/16/2017   CKD (chronic kidney disease) stage 2, GFR 60-89 ml/min 07/17/2017   Closed compression fracture of L1 vertebra ([place]) 10/22/2016   Closed fracture of left scapula 10/22/2016   Closed fracture of sacrum ([place]) 10/22/2016   Closed fracture of sternum 10/22/2016   Multiple rib fractures 10/22/2016   Trimalleolar fracture of ankle, closed, left, with routine healing, subsequent encounter 03/11/2015   Deep vein thrombosis (DVT) of right lower extremity ([place]) 11/19/2014   Irritable bowel syndrome with diarrhea 11/19/2014    Closed fracture of phalanx of foot 05/15/2013   Knee injury 01/11/2012   Osteopenia 09/18/2011   Hypercalcemia 04/12/2011   Acute thromboembolism of deep veins of lower extremity ([place]) 02/20/2010   Diabetic nephropathy ([place]) 02/20/2010   Mixed hyperlipidemia 02/20/2010   Vitamin D deficiency 02/20/2010   Past Medical History:  Past Medical History:  Diagnosis Date   Anginal pain ([place])    Anxiety    Diabetes mellitus without complication ([place])    Hypertension    Hypothyroidism    Protein C deficiency ([place])    Protein S deficiency ([place])    Past Surgical History:  Past Surgical History:  Procedure Laterality Date   ABDOMINAL SURGERY     BACK SURGERY     BREAST EXCISIONAL BIOPSY Left 2003?   benign   COLONOSCOPY N/A 09/12/2017   Procedure: COLONOSCOPY;  Surgeon: Lollie Sails, MD;  Location: [facility] ENDOSCOPY;  Service: Endoscopy;  Laterality: N/A;   COLONOSCOPY WITH PROPOFOL N/A 01/20/2016   Procedure: COLONOSCOPY WITH PROPOFOL;  Surgeon: Lollie Sails, MD;  Location: [facility] ENDOSCOPY;  Service: Endoscopy;  Laterality: N/A;   FRACTURE SURGERY     HIP ARTHROPLASTY Left 01/12/2019   Procedure: ARTHROPLASTY BIPOLAR HIP (HEMIARTHROPLASTY), LEFT;  Surgeon: Leim Fabry, MD;  Location: [facility] ORS;  Service: Orthopedics;  Laterality: Left;   PACEMAKER LEADLESS INSERTION N/A 09/20/2019   Procedure: PACEMAKER LEADLESS INSERTION;  Surgeon: Isaias Cowman, MD;  Location: [facility] CV LAB;  Service: Cardiovascular;  Laterality: N/A;   PERIPHERAL VASCULAR CATHETERIZATION N/A 05/13/2015   Procedure: IVC Filter Removal;  Surgeon: Katha Cabal, MD;  Location: [facility] CV LAB;  Service: Cardiovascular;  Laterality: N/A;   TEMPORARY PACEMAKER Right 09/20/2019   Procedure: TEMPORARY  PACEMAKER;  Surgeon: Isaias Cowman, MD;  Location: [facility] CV LAB;  Service: Cardiovascular;  Laterality: Right;   TONSILLECTOMY     HPI:  Per 78 H&P " Sierra [name] is a 86  y.o. 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     Pt was last known normal last night, and he developed right arm weakness, abnormal gait, slurred speech, right facial droop at about 815 this morning.  Symptoms have been gradually resolved at arrival to ED.  No hearing loss or vision loss.  Patient denies chest pain, shortness breath, cough.  No fever or chills.  No nausea, vomiting, diarrhea or abdominal pain.  No symptoms of UTI.     Data Reviewed and ED Course: pt was found to have WBC 7.9, negative COVID PCR, alcohol level less than 10, INR 1.2, PTT 29, renal function close to recent baseline, temperature 99.3, blood pressure 105/59, heart rate 69, RR 20, oxygen saturation 96% on room air.  CT head is negative for acute intracranial abnormalities.  Patient is placed on telemetry bed for observation, Dr. Cheral Marker of neurology is consulted."   Assessment / Plan / Recommendation Clinical Impression  Pt seen for speech/language evaluation. Pt alert, pleasant, and cooperative. Bright affect. On room air. Denies changes to speech/language/cognition this admission.   Per pt report, pt resides at SNF with 24 hour supervision and assistance with ADLs and iADLs.   Assessment completed via informal means. Pt A&Ox4. Intact functional memory noted including recent/daily events. Pt's speech is fluent, appropriate, and without s/sx anomia or dysarthria. Pt demonstrated intact basic primary language ability (auditory comprehension and verbal expression). Pt demonstrated intact basic functional and verbal problem solving.   Suspect pt is at or near functional baseline although cannot be  confirmed as no family present.   SLP to sign off as pt has no acute SLP needs. Consideration for further cognitive-linguistic assessment in functional home/environement at SNF at d/c if pt's cognitive-linguistic ability is different from baseline.   Pt and RN educated re: results of assessment and SLP POC. Pt verbalized understanding/agreement.     SLP Assessment  SLP Recommendation/Assessment: Patient does not need any further Speech Crystal Pathology Services SLP Visit Diagnosis: Dysarthria and anarthria (R47.1)    Recommendations for follow up therapy are one component of a multi-disciplinary discharge planning process, led by the attending physician.  Recommendations may be updated based on patient status, additional functional criteria and insurance authorization.    Follow Up Recommendations  No SLP follow up (for SLP services)    Assistance Recommended at Discharge  Frequent or constant Supervision/Assistance (baseline)  Functional Status Assessment Patient has not had a recent decline in their functional status  Frequency and Duration           SLP Evaluation Cognition  Overall Cognitive Status: No family/caregiver present to determine baseline cognitive functioning Arousal/Alertness: Awake/alert Orientation Level: Oriented X4 Problem Solving: Appears intact (functional basic verbal and functional (e.g. use of call bell, use of remote control, use of menu to order lunch)) Safety/Judgment: Appears intact       Comprehension  Auditory Comprehension Overall Auditory Comprehension: Appears within functional limits for tasks assessed Yes/No Questions: Within Functional Limits Commands: Within Functional Limits Reading Comprehension Reading  Status:  (WFL for menu)    Expression Expression Primary Mode of Expression: Verbal Verbal Expression Overall Verbal Expression: Appears within functional limits for tasks assessed Initiation: No impairment Automatic Speech:   (WFL) Repetition: No impairment Naming: No impairment Written Expression Dominant Hand: Right Written Expression:  (DNT)   Oral / Motor  Oral Motor/Sensory Function Overall Oral Motor/Sensory Function: Within functional limits Motor Speech Overall Motor Speech: Appears within functional limits for tasks assessed Respiration: Within functional limits Resonance: Within functional limits Articulation: Within functional limitis Intelligibility: Intelligible Motor Planning: Witnin functional limits           Cherrie Gauze, M.S., [facility] [phone] ([place])   Quintella Baton 01/18/2022, 11:51 AM

## 2022-01-18 NOTE — Evaluation (Signed)
Occupational Therapy Evaluation Patient Details Name: Sierra [name] MRN: [id number] DOB: [birth year] Today's Date: 01/18/2022   History of Present Illness Patient is an 86 year old female who reported to [facility] with concerns of stroke like symptoms.   Clinical Impression   Pt was seen for OT evaluation this date. Prior to hospital admission, pt was living at [facility] in ALF vs SNF (pt reports skilled nursing side, chart says ALF side) and generally independent with ADL per pt report. Pt also reports using w/c primarily and independent with transfers to/from the w/c and was "preparing to upgrade to a rollator." Currently pt demonstrates impairments as described below (See OT problem list) which functionally limit her ability to perform ADL/self-care tasks. Pt currently requires CGA for bed mobility for full trunk elevation, SBA-CGA-MIN A for static sitting balance during self feeding tasks (PRN set up for small packets/containers), MIN A for LB ADL tasks from seated position, and CGA for ADL transfers with VC for safety. Pt declined use of RW for SPT from EOB to recliner but did reach out beyond BOS to hold onto arm rests, never fully coming to full stand. She lost her balance to the L and R during self feeding with limited righting reflexes noted and requiring assist and VC to correct. Pt reports this is new. Static sitting balance improved with back support and arm rests. Some decreased problem solving, increased processing time, and decreased safety and awareness of deficits noted during session. Pt would benefit from skilled OT services to address noted impairments and functional limitations (see below for any additional details) in order to maximize safety and independence while minimizing falls risk and caregiver burden. Upon hospital discharge, recommend STR to maximize pt safety and return to PLOF.     Recommendations for follow up therapy are one component of a multi-disciplinary discharge planning  process, led by the attending physician.  Recommendations may be updated based on patient status, additional functional criteria and insurance authorization.   Follow Up Recommendations  Skilled nursing-short term rehab (<3 hours/day)    Assistance Recommended at Discharge Frequent or constant Supervision/Assistance  Patient can return home with the following A little help with walking and/or transfers;A little help with bathing/dressing/bathroom;Assistance with cooking/housework;Assist for transportation;Help with stairs or ramp for entrance;Direct supervision/assist for medications management;Direct supervision/assist for financial management    Functional Status Assessment  Patient has had a recent decline in their functional status and demonstrates the ability to make significant improvements in function in a reasonable and predictable amount of time.  Equipment Recommendations  Other (comment) (defer to next venue)    Recommendations for Other Services       Precautions / Restrictions Precautions Precautions: Fall Restrictions Weight Bearing Restrictions: No      Mobility Bed Mobility Overal bed mobility: Needs Assistance Bed Mobility: Supine to Sit     Supine to sit: HOB elevated, Min guard     General bed mobility comments: increased time/effort, CGA for trunk elevation with VC to come up to full sitting and not prop on her R elbow    Transfers Overall transfer level: Needs assistance Equipment used: None Transfers: Sit to/from Stand Sit to Stand: Min guard           General transfer comment: pt refused RW, reaches for arm rests of recliner to assist in standing      Balance Overall balance assessment: Needs assistance Sitting-balance support: No upper extremity supported, Feet supported Sitting balance-Leahy Scale: Poor Sitting balance - Comments:  intermittently loses balance, both L and R side, does not catch herself requiring SBA-CGA-MIN A to correct,  with PRN VC to self correct. Pt reports this is new.   Standing balance support: Single extremity supported, Bilateral upper extremity supported Standing balance-Leahy Scale: Poor Standing balance comment: reliant on arm rests of recliner while transfering from bed to recliner, refused RW                           ADL either performed or assessed with clinical judgement   ADL                                         General ADL Comments: Pt currently requires MIN A for LB ADL tasks in sitting, PRN set up for meal tray, SBA-CGA-MIN A for static sitting balance during seated self feeding and grooming tasks. CGA for ADL transfers with VC for safety.     Vision         Perception     Praxis      Pertinent Vitals/Pain Pain Assessment Pain Assessment: No/denies pain     Hand Dominance Right   Extremity/Trunk Assessment Upper Extremity Assessment Upper Extremity Assessment: Overall WFL for tasks assessed (grossly WFL, difficulty with manipulating small packets/containers at baseline)   Lower Extremity Assessment Lower Extremity Assessment: Generalized weakness       Communication Communication Communication: No difficulties   Cognition Arousal/Alertness: Awake/alert Behavior During Therapy: WFL for tasks assessed/performed Overall Cognitive Status: No family/caregiver present to determine baseline cognitive functioning                                 General Comments: pt alert and oriented x4, however demonstrates impaired safety awareness and awareness of deficits, decreased righting reflexes for static sitting balance. Also note slow processing of commands and problem solving.     General Comments       Exercises Other Exercises Other Exercises: Pt educated in role of OT, falls prevention strategies, and benefits of RW for ADL transfers for safety, strategies to improve sitting balance   Shoulder Instructions      Home  Living Family/patient expects to be discharged to:: Assisted living (pt reports skilled nursing side of [facility])                             Home Equipment: Rollator (4 wheels)   Additional Comments: Mod I living at [facility]      Prior Functioning/Environment Prior Level of Function : Independent/Modified Independent             Mobility Comments: WC bound, has a rollator ADLs Comments: pt reports indep with basic ADL, assist for medications, meals, cleaning        OT Problem List: Decreased strength;Decreased safety awareness;Impaired balance (sitting and/or standing);Decreased knowledge of use of DME or AE      OT Treatment/Interventions: Self-care/ADL training;Therapeutic activities;Therapeutic exercise;Neuromuscular education;Cognitive remediation/compensation;DME and/or AE instruction;Patient/family education;Balance training    OT Goals(Current goals can be found in the care plan section) Acute Rehab OT Goals Patient Stated Goal: go back to [facility] OT Goal Formulation: With patient Time For Goal Achievement: 02/01/22 Potential to Achieve Goals: Good ADL Goals Pt Will Perform Lower Body Dressing:  with modified independence;sitting/lateral leans Pt Will Transfer to Toilet: with modified independence;stand pivot transfer;bedside commode (BSC over toilet from w/c) Additional ADL Goal #1: Pt will complete shower with remote supervision, primarily seated.  OT Frequency: Min 2X/week    Co-evaluation              AM-PAC OT "6 Clicks" Daily Activity     Outcome Measure Help from another person eating meals?: None Help from another person taking care of personal grooming?: A Little Help from another person toileting, which includes using toliet, bedpan, or urinal?: A Little Help from another person bathing (including washing, rinsing, drying)?: A Little Help from another person to put on and taking off regular upper body clothing?: A Little Help  from another person to put on and taking off regular lower body clothing?: A Little 6 Click Score: 19   End of Session Equipment Utilized During Treatment: Gait belt Nurse Communication: Mobility status  Activity Tolerance: Patient tolerated treatment well Patient left: in chair;with call bell/phone within reach;with chair alarm set  OT Visit Diagnosis: Other abnormalities of gait and mobility (R26.89);Muscle weakness (generalized) (M62.81);Other symptoms and signs involving cognitive function                Time: 7510-2585 OT Time Calculation (min): 26 min Charges:  OT General Charges $OT Visit: 1 Visit OT Evaluation $OT Eval Moderate Complexity: 1 Mod OT Treatments $Self Care/Home Management : 8-22 mins  Arman Filter., MPH, MS, OTR/L ascom [phone] 01/18/22, 11:43 AM

## 2022-01-18 NOTE — Evaluation (Signed)
Clinical/Bedside Swallow Evaluation ?Patient Details  ?Name: Sierra [name] ?MRN: [id number] ?Date of Birth: [birth year] ? ?Today's Date: 01/18/2022 ?Time: SLP Start Time (ACUTE ONLY): 9892 SLP Stop Time (ACUTE ONLY): 1050 ?SLP Time Calculation (min) (ACUTE ONLY): 15 min ? ?Past Medical History:  ?Past Medical History:  ?Diagnosis Date  ? Anginal pain ([place])   ? Anxiety   ? Diabetes mellitus without complication ([place])   ? Hypertension   ? Hypothyroidism   ? Protein C deficiency ([place])   ? Protein S deficiency ([place])   ? ?Past Surgical History:  ?Past Surgical History:  ?Procedure Laterality Date  ? ABDOMINAL SURGERY    ? BACK SURGERY    ? BREAST EXCISIONAL BIOPSY Left 2003?  ? benign  ? COLONOSCOPY N/A 09/12/2017  ? Procedure: COLONOSCOPY;  Surgeon: Lollie Sails, MD;  Location: [facility] ENDOSCOPY;  Service: Endoscopy;  Laterality: N/A;  ? COLONOSCOPY WITH PROPOFOL N/A 01/20/2016  ? Procedure: COLONOSCOPY WITH PROPOFOL;  Surgeon: Lollie Sails, MD;  Location: [facility] ENDOSCOPY;  Service: Endoscopy;  Laterality: N/A;  ? FRACTURE SURGERY    ? HIP ARTHROPLASTY Left 01/12/2019  ? Procedure: ARTHROPLASTY BIPOLAR HIP (HEMIARTHROPLASTY), LEFT;  Surgeon: Leim Fabry, MD;  Location: [facility] ORS;  Service: Orthopedics;  Laterality: Left;  ? PACEMAKER LEADLESS INSERTION N/A 09/20/2019  ? Procedure: PACEMAKER LEADLESS INSERTION;  Surgeon: Isaias Cowman, MD;  Location: [place] CV LAB;  Service: Cardiovascular;  Laterality: N/A;  ? PERIPHERAL VASCULAR CATHETERIZATION N/A 05/13/2015  ? Procedure: IVC Filter Removal;  Surgeon: Katha Cabal, MD;  Location: [place] CV LAB;  Service: Cardiovascular;  Laterality: N/A;  ? TEMPORARY PACEMAKER Right 09/20/2019  ? Procedure: TEMPORARY PACEMAKER;  Surgeon: Isaias Cowman, MD;  Location: [place] CV LAB;  Service: Cardiovascular;  Laterality: Right;  ? TONSILLECTOMY    ? ?HPI:  ?Per 32 H&P " Sierra [name] is a 86 y.o. 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     Pt was last known normal last night, and he developed right arm weakness, abnormal gait, slurred speech, right facial droop at about 815 this morning.  Symptoms have been gradually resolved at arrival to ED.  No hearing loss or vision loss.  Patient denies chest pain, shortness breath, cough.  No fever or chills.  No nausea, vomiting, diarrhea or abdominal pain.  No symptoms of UTI.     Data Reviewed and ED Course: pt was found to have WBC 7.9, negative COVID PCR, alcohol level less than 10, INR 1.2, PTT 29, renal function close to recent baseline, temperature 99.3, blood pressure 105/59, heart rate 69, RR 20, oxygen saturation 96% on room air.  CT head is negative for acute intracranial abnormalities.  Patient is placed on telemetry bed for observation, Dr. Cheral Marker of neurology is consulted."  ?  ?Assessment / Plan / Recommendation  ?Clinical Impression ? Pt seen for clinical swallowing evaluation. Pt alert, pleasant, and cooperative. Bright affect. On room air. Pt denies difficulty swallowing PTA or changes to swallow function since admission. ? ?Pt given trials of thin liquids, pureed, and solid. Pt demonstrated an intact oral swallow. Pharyngeal swallow appeared [facility] per clinical assessment. No overt or subtle s/sx pharyngeal dysphagia. To palpation, pt with seemingly timely swallow initiation and  seemingly adequate laryngeal elevation. No change to vocal quality noted across trials.  ? ?Recommend continuation of a regular diet with thin liquids with upright positioning for POs.  ? ?Pt and RN made aware of results, recommendations and SLP POC. Pt verbalized  understanding/agreement. ?SLP Visit Diagnosis: Dysphagia, unspecified (R13.10) ?   ?Aspiration Risk ? No limitations  ?  ?Diet Recommendation Regular;Thin liquid  ? ?Medication Administration:  (as tolerated) ?Supervision: Patient able to self feed ?Postural Changes: Seated upright at 90 degrees  ?  ?Other  Recommendations Oral Care Recommendations: Oral care BID;Patient independent with oral care (set up)   ? ?Recommendations for follow up therapy are one component of a multi-disciplinary discharge planning process, led by the attending physician.  Recommendations may be updated based on patient status, additional functional criteria and insurance authorization. ? ?Follow up Recommendations No SLP follow up (for SLP services)  ? ? ?  ?Assistance Recommended at Discharge Frequent or constant Supervision/Assistance (baseline)  ?Functional Status Assessment Patient has not had a recent decline in their functional status  ?Frequency and Duration N/A ?  ?   ? ?Prognosis Prognosis for Safe Diet Advancement: Good  ? ?  ? ?Swallow Study   ?General Date of Onset: 01/17/22 ?HPI: Per 89 H&P " [name] is a 86 y.o. 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     Pt was last known normal last night, and he developed right arm weakness, abnormal gait, slurred speech, right facial droop at about 815 this morning.  Symptoms have been gradually resolved at arrival to ED.  No hearing loss or vision loss.  Patient denies chest pain, shortness breath, cough.  No fever or chills.  No nausea, vomiting, diarrhea or abdominal pain.  No symptoms of UTI.     Data Reviewed and ED Course: pt was found to  have WBC 7.9, negative COVID PCR, alcohol level less than 10, INR 1.2, PTT 29, renal function close to recent baseline, temperature 99.3, blood pressure 105/59, heart rate 69, RR 20, oxygen saturation 96% on room air.  CT head is negative for acute intracranial abnormalities.  Patient is placed on telemetry bed for observation, Dr. Cheral Marker of neurology is consulted." ?Type of Study: Bedside Swallow Evaluation ?Previous Swallow Assessment: unknown ?Diet Prior to this Study: Regular;Thin liquids ?Temperature Spikes Noted: Yes ?Respiratory Status: Room air ?History of Recent Intubation: No ?Behavior/Cognition: Alert;Cooperative;Pleasant mood ?Oral Cavity Assessment: Within Functional Limits ?Oral Care Completed by SLP: Recent completion by staff ?Oral Cavity - Dentition: Adequate natural dentition ?Vision: Functional for self-feeding ?Self-Feeding Abilities: Able to feed self ?Patient Positioning: Upright in bed ?Baseline Vocal Quality: Normal ?Volitional Cough: Strong ?Volitional Swallow: Able to elicit  ?  ?Oral/Motor/Sensory Function Overall Oral Motor/Sensory Function: Within functional limits   ?Thin Liquid Thin Liquid: Within functional limits ?Presentation: Straw ?Other Comments: ~4 oz  ?  ?Puree Puree: Within functional limits ?Presentation: Self Fed;Spoon ?Other Comments: ~3 oz   ?Solid ? ? ?  Solid: Within functional limits ?Presentation: Self Fed ?Other Comments: x1 cracker; presented in halves  ? ?  ?Cherrie Gauze, M.S., CCC-SLP ?Speech-Language Pathologist ?[facility] ?[phone] ([place])  ? ?Quintella Baton ?01/18/2022,11:42 AM ? ? ? ?

## 2022-01-18 NOTE — Progress Notes (Signed)
Report called to [place] at [facility] ? ?

## 2022-01-18 NOTE — Progress Notes (Signed)
SLP Cancellation Note ? ?Patient Details ?Name: Sierra [name] ?MRN: [id number] ?DOB: 11-Oct-1936 ? ? ?Cancelled treatment:       Reason Eval/Treat Not Completed: Patient at procedure or test/unavailable  ? ?Pt OTF for Head CT. Will continue efforts as appropriate. ? ?Cherrie Gauze, M.S., CCC-SLP ?Speech-Language Pathologist ?[facility] ?[phone] ([place])  ? ?Sierra [name] ?01/18/2022, 9:57 AM ?

## 2022-01-18 NOTE — Progress Notes (Signed)
PIV removed. Discharge instructions in packet for [facility]. Patient belongings gathered and packed to discharge.  ? ?

## 2022-01-18 NOTE — TOC Transition Note (Signed)
Transition of Care (TOC) - CM/SW Discharge Note ? ? ?Patient Details  ?Name: Hayly Litsey ?MRN: [id number] ?Date of Birth: 01/26/1936 ? ?Transition of Care (TOC) CM/SW Contact:  ?Rasheed Welty A Twyla Dais, LCSW ?Phone Number: ?01/18/2022, 1:46 PM ? ? ?Clinical Narrative:   Clinical Social Worker facilitated patient discharge including contacting patient family and facility to confirm patient discharge plans.  Clinical information faxed to facility and family agreeable with plan.  CSW arranged ambulance transport via [facility] to [facility] (room 205) .  RN to call [phone] for report prior to discharge. ? ?CSW called pt's daughter- left voicemail. ? ?Final next level of care: [place] ?Barriers to Discharge: No Barriers Identified ? ? ?Patient Goals and CMS Choice ?Patient states their goals for this hospitalization and ongoing recovery are:: go back to [facility] and get therapy there ?CMS Medicare.gov Compare Post Acute Care list provided to:: Patient ?Choice offered to / list presented to : Patient ? ?Discharge Placement ?  ?           ?Patient chooses bed at:  ([facility]) ?Patient to be transferred to facility by: [facility] ?  ?Patient and family notified of of transfer: 01/18/22 ? ?Discharge Plan and Services ?  ?  ?           ?  ?  ?  ?  ?  ?  ?  ?  ?  ?  ? ?Social Determinants of Health (SDOH) Interventions ?  ? ? ?Readmission Risk Interventions ?No flowsheet data found. ? ? ? ? ?

## 2022-01-18 NOTE — Plan of Care (Addendum)
Neurology plan of care ? ?Results of stroke workup: ? ?LDL 73 ?A1c in process ? ?Creatinine 1.6, defer CTA ? ?Carotid US ?1. Mild (1-49%) stenosis proximal right internal carotid artery ?secondary to mild smooth heterogeneous atherosclerotic plaque. ?2. Mild (1-49%) stenosis proximal left internal carotid artery ?secondary to trace smooth heterogeneous atherosclerotic plaque. ?3. Vertebral arteries are patent with normal antegrade flow. ? ?TTE ?Left ventricular ejection fraction, by estimation, is >75%. The left ventricle has ?hyperdynamic function. The left ventricle has no regional wall motion abnormalities. ?There is mild asymmetric left ventricular hypertrophy of the septal segment. Left ?ventricular diastolic parameters are consistent with Grade II diastolic dysfunction ?(pseudonormalization). ?2. Right ventricular systolic function is normal. The right ventricular size is normal. ?3. Left atrial size was mildly dilated. ?A single view (parasternal long axis) suggests possibly severe MR (? SAM), but the ?image quality is poor and is not reproduced in other views. The mitral valve is normal ?in structure. Mild mitral valve regurgitation. ?4. LVOT velocities were collected using continuous wave doppler, so it is unknown if ?these are truly elevated velocities or there is signal from MR/ aortic vlave artificially ?increasing the signal. Possible LVOT Gradient. Did not adequately assess for AV ?stenosis, but it appears to open normally. The aortic valve is normal in structure. ?Aortic valve regurgitation is not visualized. ? ?PM is MR compatible but we are not equipped for MRI with any type of PM at [facility]. Repeat head CT today did not show any e/o acute infarct. Stroke workup complete. Patient may be discharged on home dose of eliquis with outpatient neuro f/u (I will arrange). Per Dr. Corky Sox, patient may f/u outpatient with cardiology regarding TTE findings. ? ?Su Monks, MD ?[facility] ?[phone] ? ?If 7pm- 7am, please page neurology on call as listed in [place]. ? ?

## 2022-01-18 NOTE — Progress Notes (Signed)
PT Cancellation Note ? ?Patient Details ?Name: Sierra [name] ?MRN: [id number] ?DOB: December 14, 1935 ? ? ?Cancelled Treatment:    Reason Eval/Treat Not Completed: Patient at procedure or test/unavailable ? ?Pt off unit this am for test.  Upon return discharge orders in.  Will continue tomorrow if she does not discharge. ? ? ?Chesley Noon ?01/18/2022, 11:22 AM ?

## 2022-01-19 DIAGNOSIS — E1159 Type 2 diabetes mellitus with other circulatory complications: Secondary | ICD-10-CM | POA: Diagnosis not present

## 2022-01-19 DIAGNOSIS — I10 Essential (primary) hypertension: Secondary | ICD-10-CM | POA: Diagnosis not present

## 2022-01-19 DIAGNOSIS — Z8673 Personal history of transient ischemic attack (TIA), and cerebral infarction without residual deficits: Secondary | ICD-10-CM | POA: Diagnosis not present

## 2022-01-19 LAB — HEMOGLOBIN A1C
Hgb A1c MFr Bld: 6.7 % — ABNORMAL HIGH (ref 4.8–5.6)
Mean Plasma Glucose: 146 mg/dL

## 2022-01-25 DIAGNOSIS — C561 Malignant neoplasm of right ovary: Principal | ICD-10-CM

## 2022-01-25 DIAGNOSIS — Z5111 Encounter for antineoplastic chemotherapy: Principal | ICD-10-CM

## 2022-01-25 NOTE — Unmapped (Signed)
I left a message for Patricia Oddi, Ms. Caudell daughter on 3/19. She has written a MyChart message stating that her Mom was feeling fduzzy and unable to hold a utensil on 3/12. Ms. Jabbour presented to the ED and was [facility]. She was hydrated  And stoke/TIA were ruled out. Post hydration she was back to her normal and was discharged.  Two days later on 3/15, she vomited at breakfast and has not been eating or drinking well since.According to her daughter, if she eats she gets epigastric/chest pain. She is encouraged to talk to the staff/NP to stat omeprazole 40 mg qAM. She is encouraged to drink 4 oz. of shakes or boost q 30 minutes. Only 4 oz should be set in front of her, refills should not be in site, but given at the same volume every 30 minutes. They have given her ondansetron 16 mg, which may be constipating. Patricia Perez is made aware on the message. I will speak with her tomorrow and the clinic number is given on the message.

## 2022-01-26 NOTE — Unmapped (Signed)
Specialty Medication Follow-up    Patricia Perez is a 86 y.o. female with recurrent ovarian yolk sac cancer who I am seeing for follow up on their treatment with oral etoposide.     Chemotherapy: Etoposide 50 mg PO daily days 1-14/28  Cycle date: Cycle 2 Day 1 (01/12/22)    A/P:   1. Oral Chemotherapy: CBC w/diff and CMP reviewed. Grade 1 thrombocytopenia which has improved. Grade 2 serum creatinine elevation which is stable. Grade 2 nausea, grade 1 vomiting, and grade 2 dyspepsia all of which are new. No grade 3 toxicities and patient completed cycle 2 etoposide. She will be evaluated next week to see if symptoms improve or resolve while off etoposide.   ?? Continue etoposide 50 mg PO daily  ?? Obtain CBC w/diff weekly    2. Nausea/Vomiting: Grade 2 nausea and grade 1 vomiting both of which are new. Given that it is unclear if it is secondary to chemotherapy given the lack of nausea or vomiting with cycle 1. If resolves while off etoposide add anti-emetic premedication.   ?? Consider ondansetron 8 mg PO daily 15-20 minutes prior to etoposide    3. Dyspepsia: Grade 2 which is new. Patient has subsequently started omeprazole therapy. Will re-evaluate while off etoposide therapy.   ?? Continue omeprazole 40 mg PO daily    I spent approximately 15 minutes in direct patient care.    Next follow up: In 1 week for visit (02/03/22)    Referring physician: Dr. Mauricia Area, PharmD, BCOP, CPP  Gynecologic Oncology Clinic Pharmacist  Pager: [phone]    S/O: Ms. Imhoff daughter was contacted via myChart regarding recent lab results and oral chemotherapy toxicity assessment. She reports that her mother had a rough week and was in the ED earlier this week with confusion, weakness, slurred speech, and trouble with fine motor skills. She was given IV hydration and went home the following day. She then woke up on Wednesday and felt like she had severe heartburn. She vomited on Thursday and has trouble keeping any food down. She states that whenever she puts food near her mouth her chest starts to hurt.      Medications reviewed and updated in EPIC? no    Missed doses: -----    Labs (01/21/22)  WBC 7.4 x10*9/L   Hgb 11.9 g/dL   Plt 324 M01*0/U   ANC 6.4 x10*9/L   SCr 1.63 mg/dL   TBili 1.0 mg/dL   AST 12 U/L   ALT 10 U/L   Alk Phos 37 U/L         Gynecologic Oncology    Gynecologic Oncology Metrics:      Dose and Schedule: Etoposide 50 mg PO daily days 1-14/28     Chemotherapy Dose: Dose Documented     Chemotherapy Schedule: Schedule documented     NCI CTCAE: Thrombocytopenia - Grade 1, Serum creatinine - Grade 2, Nausea - Grade 2, Vomiting - Grade 1, Dyspepsia - Grade 2        Interventions: Additional agent prescribed for side effect management  Comments: Ondansetron 8 mg PO daily 15-20 min prior to etoposide administration

## 2022-02-01 NOTE — Unmapped (Signed)
SCheduled appointment with patient- Confirmed  atf

## 2022-02-02 DIAGNOSIS — R070 Pain in throat: Secondary | ICD-10-CM | POA: Diagnosis not present

## 2022-02-03 ENCOUNTER — Ambulatory Visit: Admit: 2022-02-03 | Discharge: 2022-02-03 | Payer: MEDICARE

## 2022-02-03 DIAGNOSIS — C561 Malignant neoplasm of right ovary: Principal | ICD-10-CM

## 2022-02-03 DIAGNOSIS — Z5111 Encounter for antineoplastic chemotherapy: Principal | ICD-10-CM

## 2022-02-03 LAB — COMPREHENSIVE METABOLIC PANEL
ALBUMIN: 3.7 g/dL (ref 3.4–5.0)
ALKALINE PHOSPHATASE: 49 U/L (ref 46–116)
ALT (SGPT): 12 U/L (ref 10–49)
ANION GAP: 8 mmol/L (ref 5–14)
AST (SGOT): 23 U/L (ref <=34)
BILIRUBIN TOTAL: 0.4 mg/dL (ref 0.3–1.2)
BLOOD UREA NITROGEN: 20 mg/dL (ref 9–23)
BUN / CREAT RATIO: 14
CALCIUM: 9.1 mg/dL (ref 8.7–10.4)
CHLORIDE: 113 mmol/L — ABNORMAL HIGH (ref 98–107)
CO2: 24 mmol/L (ref 20.0–31.0)
CREATININE: 1.44 mg/dL — ABNORMAL HIGH
EGFR CKD-EPI (2021) FEMALE: 36 mL/min/{1.73_m2} — ABNORMAL LOW (ref >=60)
GLUCOSE RANDOM: 81 mg/dL (ref 70–179)
POTASSIUM: 4.3 mmol/L (ref 3.5–5.1)
PROTEIN TOTAL: 6.4 g/dL (ref 5.7–8.2)
SODIUM: 145 mmol/L (ref 135–145)

## 2022-02-03 LAB — CBC W/ AUTO DIFF
BASOPHILS ABSOLUTE COUNT: 0 10*9/L (ref 0.0–0.1)
BASOPHILS RELATIVE PERCENT: 1.3 %
EOSINOPHILS ABSOLUTE COUNT: 0 10*9/L (ref 0.0–0.5)
EOSINOPHILS RELATIVE PERCENT: 1.5 %
HEMATOCRIT: 30.7 % — ABNORMAL LOW (ref 34.0–44.0)
HEMOGLOBIN: 10.3 g/dL — ABNORMAL LOW (ref 11.3–14.9)
LYMPHOCYTES ABSOLUTE COUNT: 0.7 10*9/L — ABNORMAL LOW (ref 1.1–3.6)
LYMPHOCYTES RELATIVE PERCENT: 25.6 %
MEAN CORPUSCULAR HEMOGLOBIN CONC: 33.4 g/dL (ref 32.0–36.0)
MEAN CORPUSCULAR HEMOGLOBIN: 30.4 pg (ref 25.9–32.4)
MEAN CORPUSCULAR VOLUME: 91.2 fL (ref 77.6–95.7)
MEAN PLATELET VOLUME: 12.1 fL — ABNORMAL HIGH (ref 6.8–10.7)
MONOCYTES ABSOLUTE COUNT: 0.4 10*9/L (ref 0.3–0.8)
MONOCYTES RELATIVE PERCENT: 13.6 %
NEUTROPHILS ABSOLUTE COUNT: 1.6 10*9/L — ABNORMAL LOW (ref 1.8–7.8)
NEUTROPHILS RELATIVE PERCENT: 58 %
PLATELET COUNT: 110 10*9/L — ABNORMAL LOW (ref 150–450)
RED BLOOD CELL COUNT: 3.37 10*12/L — ABNORMAL LOW (ref 3.95–5.13)
RED CELL DISTRIBUTION WIDTH: 16.8 % — ABNORMAL HIGH (ref 12.2–15.2)
WBC ADJUSTED: 2.8 10*9/L — ABNORMAL LOW (ref 3.6–11.2)

## 2022-02-03 LAB — AFP TUMOR MARKER: AFP-TUMOR MARKER: 1677 ng/mL — ABNORMAL HIGH (ref <=8)

## 2022-02-03 NOTE — Unmapped (Signed)
Referring Physician: Verlin Dike, Md  [address]  [id number] Physician Office Building  [place],  [place] [address]. [phone]    PCP: Verdell Carmine, PA      ESTABLISHED PATIENT NOTE    Assessment and Plan:  [facility] is overall tolerating treatment. I reviewed that there are really no other options at this point and we do not yet know if this is responding.  Mrs. Art and [place] feel she can proceed with C3 as she feels well. She knows there are other potential side effects and she may have mucositis again which would inhibit eating.  She has a regular GI doc and they will also ask him about these symptoms.    Plan: Continue with C3; AFP drawn.    I reviewed the patient's old records and labs since her last visit with me, see summary below.    History of Present Illness:   Patricia Perez is a 86 y.o. woman.  Oncology History Overview Note   12/15/20:  Exploratory laparotomy, bilateral salpingo-oophorectomy, R pelvic and para-aortic lymphadenectomy, omentectomy, peritoneal biopsies, oversew of partial thickness sigmoid colon injury.   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 Recommend: BEP treatment x 3.  LDH is not a marker for her but AFP is (440).    01/12/21-03/06/21 C 1-3 BEP.  AFP 5/22=22    04/02/21 CTches/ab/ pelvis: no recurrence but in pelvis: Interval sequela of bilateral salpingo-oophorectomy and resection of a previously identified right adnexal mass. There is indeterminate hypoattenuating 3.3 cm right pelvic sidewall lesion which in the postoperative setting may represent a seroma/lymphocele, however residual disease is not entirely excluded. Recommend short interval follow up.  Plan f/up CT ini 3 mos.    07/01/21 Ct --Similar-appearing indeterminate right pelvic sidewall hypoattenuating mass measuring up to 2.4 cm on remeasurement. No new sites of disease the abdomen or pelvis. Left lower quadrant omental nodularity has improved when compared to prior exam.     Yolk sac tumor of the ovary 12/15/2020 Initial Diagnosis    Yolk sac tumor of the ovary     12/15/2020 Surgery    Exploratory laparotomy, bilateral salpingo-oophorectomy, R pelvic and para-aortic lymphadenectomy, omentectomy, peritoneal biopsies     12/15/2020 -  Cancer Staged    Staging form: Ovary, AJCC 7th Edition  - Clinical stage from 12/15/2020: FIGO Stage IC3, calculated as Stage IC (T1c, N0, M0) - Signed by Marlow Baars, MD on 12/31/2020       12/31/2020 Tumor Board    Stage: Stage IC3 yolk sac tumor     Plan: Patient-centered discussion. Standard of care is BEP chemotherapy 3 to 4 cycles.      01/12/2021 - 01/16/2021 Chemotherapy    IP/OP OVARIAN EP (YOLK SAC TUMOR NO PRIOR XRT)  BLEOmycin 20 units/m2 IV on day 1, etoposide 75 mg/m2 IV on days 1-5, CISplatin 20 mg/m2 IV on days 1-5, every 21 days     02/09/2021 -  Chemotherapy    OP Ovarian Yolk sac EP (ETOPOSIDE/CISPLATIN)  BLEOmycin 30 units IV on days 2, 9, 16, etoposide 100 mg/m2 IV on days 1-5, CISplatin 20 mg/m2 days 1-5, every 21 days     07/01/2021 Interval Scan(s)    Ct ab/pelvis: stable findings. --Similar-appearing indeterminate right pelvic sidewall hypoattenuating mass measuring up to 2.4 cm on remeasurement. No new sites of disease the abdomen or pelvis.   Left lower quadrant omental nodularity has improved when compared to prior exam.      Interval  Scan(s)    07/07/21 AFP increasing to 97.  Pet ordered to clarify earlier CT and showed possible recurrence: Hypermetabolic soft tissue density lesion in the right pelvis which may represent recurrent disease. Consider correlation with MRI.     -Focal intense uptake is seen in the rectosigmoid junction, concerning but not definitive for a malignant or benign lesion. Recommend clinical correlation. Consider colonoscopy or MRI.     07/30/2021 Interval Scan(s)    PET: Hypermetabolic soft tissue density lesion in the right pelvis which may represent recurrent disease. Consider correlation with MRI.(Was 2 cm on CT)     -Focal intense uptake is seen in the rectosigmoid junction, concerning but not definitive for a malignant or benign lesion. Recommend clinical correlation. Consider colonoscopy or MRI.     -Posterior body wall stranding around the sacrum which may represent early pressure injury development. Recommend clinical.     09/17/2021 Recurrence    CT-guided biopsy of pelvic mass with recurrent yolk sac tumor     10/14/2021 Tumor Board    Stage: recurrent yolk sac tumor    Plan: Given oligometastatic disease, if functional status would allow, consider surgical resection. However, given that she is wheelchair bound and imaging with concern for sacral pressure ulcer, patient may not be a surgical candidate.   - If not offering resection, consider salvage chemotherapy. NCCN guidelines suggest that paclitaxel/ifosfamide/cisplatin is the preferred curative regimen for recurrent germ cell tumors. However, could also consider regimens such as carboplatin/paclitaxel, or single agent regimens such as taxotere or paclitaxel, depending on performance status  - Colonoscopy to evaluate hypermetabolic areas in rectosigmoid and transverse colon on PET     11/09/2021 -  Chemotherapy    Started oral etoposide 50 mg qd     11/27/2021 Interval Scan(s)    [place] CT:    1.Enlarging, now 2.2 cm soft tissue density in the right pelvis which corresponds to an area that was FDG avid on recent PET/CT scan and could represent disease recurrence.  2.Area of FDG avidity in the rectosigmoid junction on recent PET/CT is not identified on this examination. Correlation with colonoscopy could be considered if clinically indicated.  3.Previously described hypoattenuating 2.6 cm right pelvic sidewall lesion with central hypoattenuation, is slightly increased in size and may represent a cystic lesion versus lymph node.       Patricia Perez is here for review of C2 and discussion of proceeding with C3. She felt fairly well while taking C2 but then developed mucositis that precluded eating for several days but this has improved and she is eating well now.  She also had a small TIA (while on DOAC) and has recovered. This was not felt to be due to etoposide.    Medical/Surgical History:    Medical problems and medications are reviewed and updated in EPIC.  Past Medical History:   Diagnosis Date   ??? Colitis    ??? Depression (emotion)    ??? Diabetes mellitus (CMS-[name])    ??? Disease of thyroid gland    ??? Hypertension    ??? Hypothyroidism (acquired)      Past Surgical History:   Procedure Laterality Date   ??? ANKLE SURGERY     ??? hysterectomy      ??? IR INSERT PORT AGE GREATER THAN 5 YRS  01/19/2021    IR INSERT PORT AGE GREATER THAN 5 YRS 01/19/2021 Ammie Dalton, MD IMG [facility]   ??? IR INSERT PORT AGE LESS THAN 5 YRS  01/19/2021  IR INSERT PORT AGE LESS THAN 5 YRS 01/19/2021 Ammie Dalton, MD IMG [facility]IR [facility]   ??? LIPOMA RESECTION     ??? PR OOPH W/RADIC DISSECT FOR DEBULKING Right 12/15/2020    Procedure: RESECTION (INITIAL) OVARIAN, TUBAL/PRIM PERITONEAL MALIG W/BIL S&O/OMENTECT; W/RAD DISSECTION FOR DEBULKING;  Surgeon: Haze Boyden, MD;  Location: MAIN OR [facility];  Service: Gynecology Oncology   ??? TONSILLECTOMY           Social History:   Social History     Social History Narrative   ??? Not on file     See above    CA 125   Date Value Ref Range Status   11/19/2020 12 0 - 35 U/mL Final       Physical Exam:  BP 143/83  - Pulse 71  - Temp 37 ??C (98.6 ??F) (Oral)  - Resp 18  - Ht 170.2 cm (5' 7)  - Wt 87.5 kg (193 lb)  - SpO2 99%  - BMI 30.23 kg/m??   General: Patient is comfortable appearing.  In wheelchair.  Her daughter Tamela Oddi is here. Marko Stai CPP present.  Referring Physician: Verlin Dike, Md  [address]  [id number] Physician Office Building  [place],  [place] [address]. [phone]    PCP: Verdell Carmine, PA      ESTABLISHED PATIENT NOTE    Assessment and Plan:  Rosali Augello is doing well and has no evidence of recurrence on exam.    I reviewed Amila Callies old records and labs from [facility] since her last visit with me, see summary below.         History of Present Illness:   Asher Babilonia is a 86 y.o. woman.  Oncology History Overview Note   12/15/20:  Exploratory laparotomy, bilateral salpingo-oophorectomy, R pelvic and para-aortic lymphadenectomy, omentectomy, peritoneal biopsies, oversew of partial thickness sigmoid colon injury.   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 Recommend: BEP treatment x 3.  LDH is not a marker for her but AFP is (440).    01/12/21-03/06/21 C 1-3 BEP.  AFP 5/22=22    04/02/21 CTches/ab/ pelvis: no recurrence but in pelvis: Interval sequela of bilateral salpingo-oophorectomy and resection of a previously identified right adnexal mass. There is indeterminate hypoattenuating 3.3 cm right pelvic sidewall lesion which in the postoperative setting may represent a seroma/lymphocele, however residual disease is not entirely excluded. Recommend short interval follow up.  Plan f/up CT ini 3 mos.    07/01/21 Ct --Similar-appearing indeterminate right pelvic sidewall hypoattenuating mass measuring up to 2.4 cm on remeasurement. No new sites of disease the abdomen or pelvis. Left lower quadrant omental nodularity has improved when compared to prior exam.     Yolk sac tumor of the ovary   12/15/2020 Initial Diagnosis    Yolk sac tumor of the ovary     12/15/2020 Surgery    Exploratory laparotomy, bilateral salpingo-oophorectomy, R pelvic and para-aortic lymphadenectomy, omentectomy, peritoneal biopsies     12/15/2020 -  Cancer Staged    Staging form: Ovary, AJCC 7th Edition  - Clinical stage from 12/15/2020: FIGO Stage IC3, calculated as Stage IC (T1c, N0, M0) - Signed by Marlow Baars, MD on 12/31/2020       12/31/2020 Tumor Board    Stage: Stage IC3 yolk sac tumor     Plan: Patient-centered discussion. Standard of care is BEP chemotherapy 3 to 4 cycles.      01/12/2021 - 01/16/2021 Chemotherapy    IP/OP OVARIAN  EP (YOLK SAC TUMOR NO PRIOR XRT)  BLEOmycin 20 units/m2 IV on day 1, etoposide 75 mg/m2 IV on days 1-5, CISplatin 20 mg/m2 IV on days 1-5, every 21 days     02/09/2021 -  Chemotherapy    OP Ovarian Yolk sac EP (ETOPOSIDE/CISPLATIN)  BLEOmycin 30 units IV on days 2, 9, 16, etoposide 100 mg/m2 IV on days 1-5, CISplatin 20 mg/m2 days 1-5, every 21 days     07/01/2021 Interval Scan(s)    Ct ab/pelvis: stable findings. --Similar-appearing indeterminate right pelvic sidewall hypoattenuating mass measuring up to 2.4 cm on remeasurement. No new sites of disease the abdomen or pelvis.   Left lower quadrant omental nodularity has improved when compared to prior exam.      Interval Scan(s)    07/07/21 AFP increasing to 97.  Pet ordered to clarify earlier CT and showed possible recurrence: Hypermetabolic soft tissue density lesion in the right pelvis which may represent recurrent disease. Consider correlation with MRI.     -Focal intense uptake is seen in the rectosigmoid junction, concerning but not definitive for a malignant or benign lesion. Recommend clinical correlation. Consider colonoscopy or MRI.     07/30/2021 Interval Scan(s)    PET: Hypermetabolic soft tissue density lesion in the right pelvis which may represent recurrent disease. Consider correlation with MRI.(Was 2 cm on CT)     -Focal intense uptake is seen in the rectosigmoid junction, concerning but not definitive for a malignant or benign lesion. Recommend clinical correlation. Consider colonoscopy or MRI.     -Posterior body wall stranding around the sacrum which may represent early pressure injury development. Recommend clinical.     09/17/2021 Recurrence    CT-guided biopsy of pelvic mass with recurrent yolk sac tumor     10/14/2021 Tumor Board    Stage: recurrent yolk sac tumor    Plan: Given oligometastatic disease, if functional status would allow, consider surgical resection. However, given that she is wheelchair bound and imaging with concern for sacral pressure ulcer, patient may not be a surgical candidate.   - If not offering resection, consider salvage chemotherapy. NCCN guidelines suggest that paclitaxel/ifosfamide/cisplatin is the preferred curative regimen for recurrent germ cell tumors. However, could also consider regimens such as carboplatin/paclitaxel, or single agent regimens such as taxotere or paclitaxel, depending on performance status  - Colonoscopy to evaluate hypermetabolic areas in rectosigmoid and transverse colon on PET     11/09/2021 -  Chemotherapy    Started oral etoposide 50 mg qd     11/27/2021 Interval Scan(s)    [place] CT:    1.Enlarging, now 2.2 cm soft tissue density in the right pelvis which corresponds to an area that was FDG avid on recent PET/CT scan and could represent disease recurrence.  2.Area of FDG avidity in the rectosigmoid junction on recent PET/CT is not identified on this examination. Correlation with colonoscopy could be considered if clinically indicated.  3.Previously described hypoattenuating 2.6 cm right pelvic sidewall lesion with central hypoattenuation, is slightly increased in size and may represent a cystic lesion versus lymph node.       Aramis Zobel is here for surveillance.     Medical/Surgical History:  Medical problems and medications are reviewed and updated in EPIC.    Past Medical History:   Diagnosis Date   ??? Colitis    ??? Depression (emotion)    ??? Diabetes mellitus (CMS-[name])    ??? Disease of thyroid gland    ??? Hypertension    ???  Hypothyroidism (acquired)      Past Surgical History:   Procedure Laterality Date   ??? ANKLE SURGERY     ??? hysterectomy      ??? IR INSERT PORT AGE GREATER THAN 5 YRS  01/19/2021    IR INSERT PORT AGE GREATER THAN 5 YRS 01/19/2021 Ammie Dalton, MD IMG [facility]   ??? IR INSERT PORT AGE LESS THAN 5 YRS  01/19/2021    IR INSERT PORT AGE LESS THAN 5 YRS 01/19/2021 Ammie Dalton, MD IMG [facility]   ??? LIPOMA RESECTION     ??? PR OOPH W/RADIC DISSECT FOR DEBULKING Right 12/15/2020    Procedure: RESECTION (INITIAL) OVARIAN, TUBAL/PRIM PERITONEAL MALIG W/BIL S&O/OMENTECT; W/RAD DISSECTION FOR DEBULKING;  Surgeon: Haze Boyden, MD;  Location: MAIN OR [facility];  Service: Gynecology Oncology   ??? TONSILLECTOMY           Social History:   Social History     Social History Narrative   ??? Not on file     See above    Physical Exam:  BP 143/83  - Pulse 71  - Temp 37 ??C (98.6 ??F) (Oral)  - Resp 18  - Ht 170.2 cm (5' 7)  - Wt 87.5 kg (193 lb)  - SpO2 99%  - BMI 30.23 kg/m??   General: Patient is comfortable appearing.  Here with B[place]y. Marko Stai CPP present.  Gastrointestinal: Abdomen soft, non-tender and not distended,  .          Verlin Dike, MD

## 2022-02-04 MED ORDER — SLEEPY BUTTER
2 refills | 0 days | Status: CP
Start: 2022-02-04 — End: ?

## 2022-02-04 NOTE — Unmapped (Signed)
Specialty Medication Follow-up    Patricia Perez is a 86 y.o. female with recurrent ovarian yolk sac cancer who I am seeing for follow up on their treatment with oral etoposide.     Chemotherapy: Etoposide 50 mg PO daily days 1-14/28  Cycle date: Cycle 3 Day 1 (02/09/22)    A/P:   1. Oral Chemotherapy: CBC w/diff and CMP reviewed. Grade 1 oral mucositis which is new. Grade 0 nausea, grade 0 vomiting, grade 1 dyspepsia, and grade 1 serum creatinine elevation all of which have improved. Grade 2 WBC decreased, grade 1 anemia, and grade 1 neutropenia all of which have worsened. Grade 1 thrombocytopenia which is stable. Patient meets treatment parameters to start cycle 3 oral etoposide next week. Will obtain labs after 1 week of therapy on 02/18/22.  Continue etoposide 50 mg PO daily  Obtain CBC w/diff and CMP in 2 weeks    2. Oral mucositis: Grade 1 which has significantly improved as patient is now able to maintain oral intake at this time. Pain when swallowing is still present but greatly improved. Will add magic mouthwash to regimen as needed.   Start magic mouthwash 10 mL swish and swallow QID PRN mucositis    3. Nausea/Vomiting: Grade 0 nausea and grade 0 vomiting which has improved while off etoposide therapy. Poor appetite was likely secondary to mucositis rather than nausea as originally thought. Will continue to monitor with cycle 3.  Change ondansetron 4 mg PO q4h PRN nausea    4. Dyspepsia: Grade 1 which has improved with omeprazole administration. Will continue to monitor as she prepares to start cycle 3 etoposide.   Continue omeprazole 40 mg PO daily    I spent approximately 15 minutes in direct patient care.    Next follow up: In 1 week for toxicity assessment (02/16/22)    Referring physician: Dr. Mauricia Area, PharmD, BCOP, CPP  Gynecologic Oncology Clinic Pharmacist  Pager: [phone]    S/O: Patricia Perez presents to clinic for follow up with Dr. Reyne Dumas. She endorses that the first week of oral etoposide went well with no toxicities. The second week she had an episode of confusion and was evaluated by local ED. They deemed her symptoms to be related to a TIA given the resolution without intervention. She also started to experience nausea which she believes was really acid reflux given that it was felt that she had a burning in her chest. After starting omeprazole she started to recognize that it was more a burning in her throat making it difficult to eat or drink. Over the past week as she has been off etoposide the pain in her throat has started to improve but is still present. She denies any current nausea or acid reflux. She endorses that she would like to move forward with treatment next week.     Medications reviewed and updated in EPIC? no    Missed doses: -----    Labs  Appointment on 02/03/2022   Component Date Value Ref Range Status    AFP-Tumor Marker 02/03/2022 1,677 (H)  <=8 ng/mL Final    Sodium 02/03/2022 145  135 - 145 mmol/L Final    Potassium 02/03/2022 4.3  3.5 - 5.1 mmol/L Final    Chloride 02/03/2022 113 (H)  98 - 107 mmol/L Final    CO2 02/03/2022 24.0  20.0 - 31.0 mmol/L Final    Anion Gap 02/03/2022 8  5 - 14 mmol/L Final  BUN 02/03/2022 20  9 - 23 mg/dL Final    Creatinine 29/56/2130 1.44 (H)  0.60 - 0.80 mg/dL Final    BUN/Creatinine Ratio 02/03/2022 14   Final    eGFR CKD-EPI (2021) Female 02/03/2022 36 (L)  >=60 mL/min/1.67m2 Final    eGFR calculated with CKD-EPI 2021 equation in accordance with [employer] and [employer] of Nephrology Task Force recommendations.    Glucose 02/03/2022 81  70 - 179 mg/dL Final    Calcium 86/57/8469 9.1  8.7 - 10.4 mg/dL Final    Albumin 62/95/2841 3.7  3.4 - 5.0 g/dL Final    Total Protein 02/03/2022 6.4  5.7 - 8.2 g/dL Final    Total Bilirubin 02/03/2022 0.4  0.3 - 1.2 mg/dL Final    AST 32/44/0102 23  <=34 U/L Final    ALT 02/03/2022 12  10 - 49 U/L Final    Alkaline Phosphatase 02/03/2022 49  46 - 116 U/L Final    WBC 02/03/2022 2.8 (L)  3.6 - 11.2 10*9/L Final    RBC 02/03/2022 3.37 (L)  3.95 - 5.13 10*12/L Final    HGB 02/03/2022 10.3 (L)  11.3 - 14.9 g/dL Final    HCT 72/53/6644 30.7 (L)  34.0 - 44.0 % Final    MCV 02/03/2022 91.2  77.6 - 95.7 fL Final    MCH 02/03/2022 30.4  25.9 - 32.4 pg Final    MCHC 02/03/2022 33.4  32.0 - 36.0 g/dL Final    RDW 03/47/4259 16.8 (H)  12.2 - 15.2 % Final    MPV 02/03/2022 12.1 (H)  6.8 - 10.7 fL Final    Platelet 02/03/2022 110 (L)  150 - 450 10*9/L Final    Neutrophils % 02/03/2022 58.0  % Final    Lymphocytes % 02/03/2022 25.6  % Final    Monocytes % 02/03/2022 13.6  % Final    Eosinophils % 02/03/2022 1.5  % Final    Basophils % 02/03/2022 1.3  % Final    Absolute Neutrophils 02/03/2022 1.6 (L)  1.8 - 7.8 10*9/L Final    Absolute Lymphocytes 02/03/2022 0.7 (L)  1.1 - 3.6 10*9/L Final    Absolute Monocytes 02/03/2022 0.4  0.3 - 0.8 10*9/L Final    Absolute Eosinophils 02/03/2022 0.0  0.0 - 0.5 10*9/L Final    Absolute Basophils 02/03/2022 0.0  0.0 - 0.1 10*9/L Final    Anisocytosis 02/03/2022 Slight (A)  Not Present Final           Gynecologic Oncology    Gynecologic Oncology Metrics:      Dose and Schedule: Etoposide 50 mg PO daily days 1-14/28     Chemotherapy Dose: Dose Documented     Chemotherapy Schedule: Schedule documented     NCI CTCAE: Mucositis - Grade 1, Nausea - None/Grade 0, Vomiting - None/Grade 0, Dyspepsia - Grade 1, Serum creatinine - Grade 1, Anemia - Grade 1, Neutropenia - Grade 1, Thrombocytopenia - Grade 1        Interventions: Additional agent prescribed for side effect management  Comments: Magic mouthwash as needed

## 2022-02-04 NOTE — Unmapped (Signed)
[facility] Refill Coordination Note    Specialty Medication(s) to be Shipped:   Hematology/Oncology: Etoposide    Other medication(s) to be shipped: No additional medications requested for fill at this time     Patricia Perez, DOB: 07-Nov-1936  Phone: [phone] (home) [phone] (work)      All above HIPAA information was verified with patient's caregiver, Patricia Perez     Was a translator used for this call? No    Completed refill call assessment today to schedule patient's medication shipment from the [facility] [phone]).  All relevant notes have been reviewed.     Specialty medication(s) and dose(s) confirmed: Regimen is correct and unchanged.   Changes to medications: Patricia Perez reports no changes at this time.  Changes to insurance: No  New side effects reported not previously addressed with a pharmacist or physician: None reported  Questions for the pharmacist: No    Confirmed patient received a Conservation officer, historic buildings and a Surveyor, mining with first shipment. The patient will receive a drug information handout for each medication shipped and additional FDA Medication Guides as required.       DISEASE/MEDICATION-SPECIFIC INFORMATION        N/A    SPECIALTY MEDICATION ADHERENCE     Medication Adherence    Patient reported X missed doses in the last month: 0  Specialty Medication: Etoposide 50mg   Patient is on additional specialty medications: No  Informant: caregiver              Were doses missed due to medication being on hold? No    Etoposide 50 mg: 0 days of medicine on hand.  New cycle starts  02/11/22      REFERRAL TO PHARMACIST     Referral to the pharmacist: Not needed      [facility]     Shipping address confirmed in Epic.     Delivery Scheduled: Yes, Expected medication delivery date: 02/09/22.     Medication will be delivered via Next Day Courier to the temporary address in Epic WAM.    Patricia Perez   [facility] Specialty Technician

## 2022-02-09 MED FILL — ETOPOSIDE 50 MG CAPSULE: ORAL | 28 days supply | Qty: 14 | Fill #2

## 2022-02-18 NOTE — Unmapped (Signed)
Specialty Medication Follow-up    Patricia Perez is a 86 y.o. female with recurrent ovarian yolk sac cancer who I am seeing for follow up on their treatment with oral etoposide.     Chemotherapy: Etoposide 50 mg PO daily days 1-14/28  Cycle date: Cycle 3 Day 1 (02/09/22)    A/P:   1. Oral Chemotherapy: CBC w/diff and CMP reviewed. Grade 1 constipation which has worsened. No grade 3 toxicities therefore will continue at current dose intensity. Patient has been on therapy for 1 week therefore will obtain labs later this week.   Continue etoposide 50 mg PO daily  Obtain CBC w/diff and CMP later this week.      I spent approximately 5 minutes in direct patient care.    Next follow up: In 1 week with lab results     Referring physician: Dr. Mauricia Area, PharmD, BCOP, CPP  Gynecologic Oncology Clinic Pharmacist  Pager: [phone]    S/O: Ms. Kasler was contacted via myChart regarding oral chemotherapy toxicity assessment. Her daughter was able to speak with her and reports that she is doing well. She denies any sores in her mouth as with the last cycle. She does endorse constipation but is working with nursing staff to start a bowel regimen to alleviate constipation.     Medications reviewed and updated in EPIC? no    Missed doses: -----    Labs (no new labs)

## 2022-02-19 NOTE — Unmapped (Signed)
Specialty Medication Follow-up    Patricia Perez is a 86 y.o. female with recurrent ovarian yolk sac cancer who I am seeing for follow up on their treatment with oral etoposide.     Chemotherapy: Etoposide 50 mg PO daily days 1-14/28  Cycle date: Cycle 3 Day 1 (02/09/22)    A/P:   1. Oral Chemotherapy: CBC w/diff and CMP reviewed. Grade 1 anemia and grade 1 thrombocytopenia both of which have improved. Grade 1 serum creatinine elevation which is stable. No grade 3 toxicities therefore will continue at current dose intensity. Will repeat labs prior to cycle 4 which is planned for 5/2.   Continue etoposide 50 mg PO daily  Obtain CBC w/diff and CMP later this week.      I spent approximately 5 minutes in direct patient care.    Next follow up: In 2 week with lab results     Referring physician: Dr. Mauricia Area, PharmD, BCOP, CPP  Gynecologic Oncology Clinic Pharmacist  Pager: [phone]      Labs (02/18/22)  WBC 4.4 x10*9/L   Hgb 11.0 g/dL   Plt 191 Y78*2/N   ANC 3.74 x10*9/L   SCr 1.38 mg/dL   TBili 0.9 mg/dL   AST 14 U/L   ALT 11 U/L   Alk Phos 36 U/L

## 2022-02-21 DIAGNOSIS — C561 Malignant neoplasm of right ovary: Principal | ICD-10-CM

## 2022-02-21 DIAGNOSIS — N183 Stage 3 chronic kidney disease, unspecified whether stage 3a or 3b CKD (CMS-HCC): Principal | ICD-10-CM

## 2022-02-21 IMAGING — MG MM DIGITAL SCREENING BILAT W/ TOMO AND CAD
8 of 14 series · 8 of 40 positions shown · non-contrast
Comparison: Previous exam(s).

CLINICAL DATA: Screening.

EXAM:
DIGITAL SCREENING BILATERAL MAMMOGRAM WITH TOMOSYNTHESIS AND CAD
TECHNIQUE: Bilateral screening digital craniocaudal and mediolateral oblique
mammograms were obtained. Bilateral screening digital breast
tomosynthesis was performed. The images were evaluated with
computer-aided detection.

[R MLO synth-2D (1 of 2)]
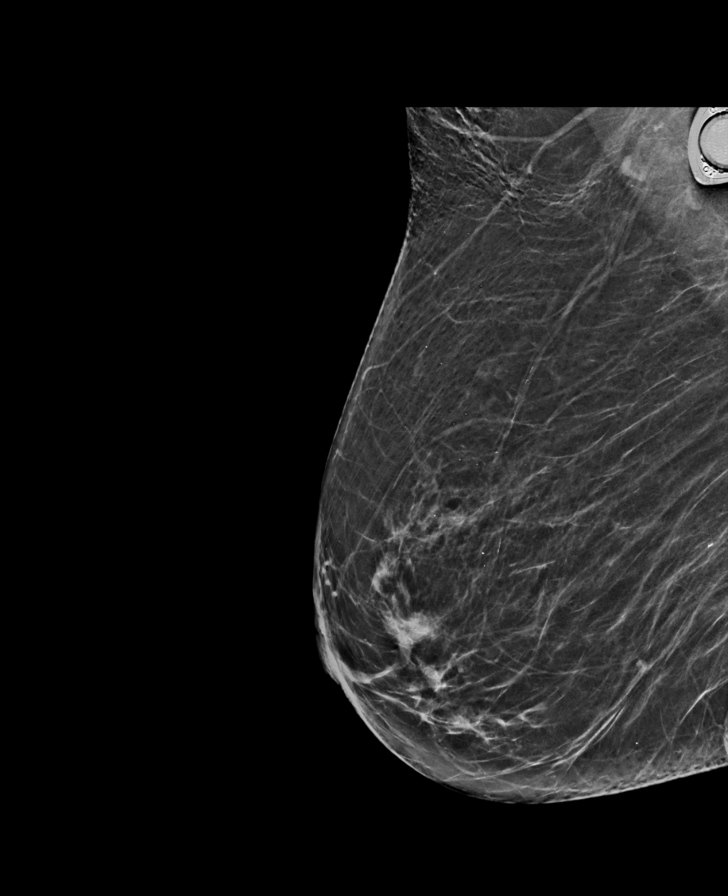

[L MLO synth-2D (1 of 2)]
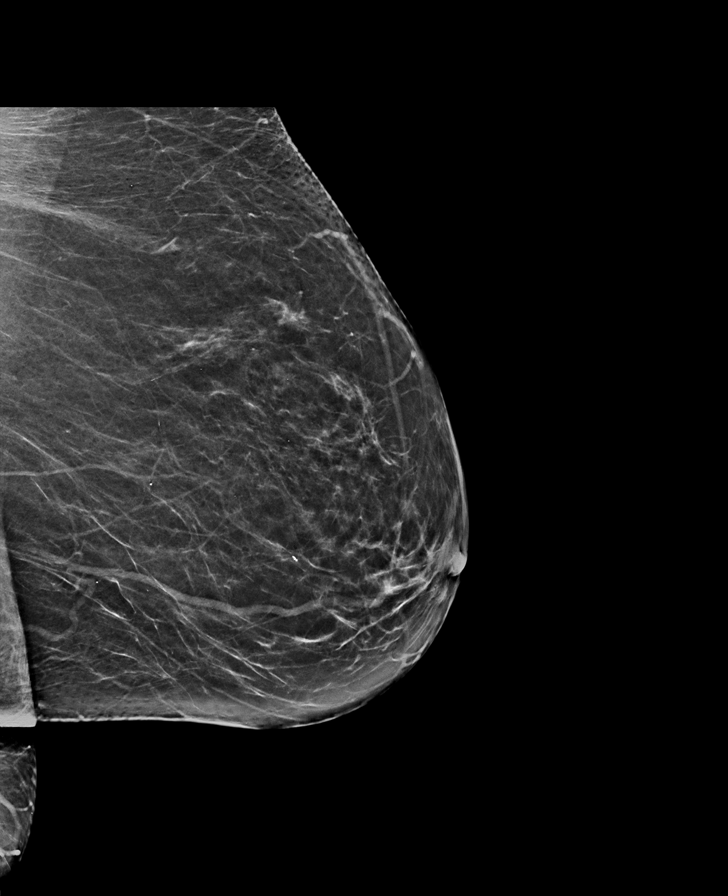

[L MLO synth-2D (2 of 2)]
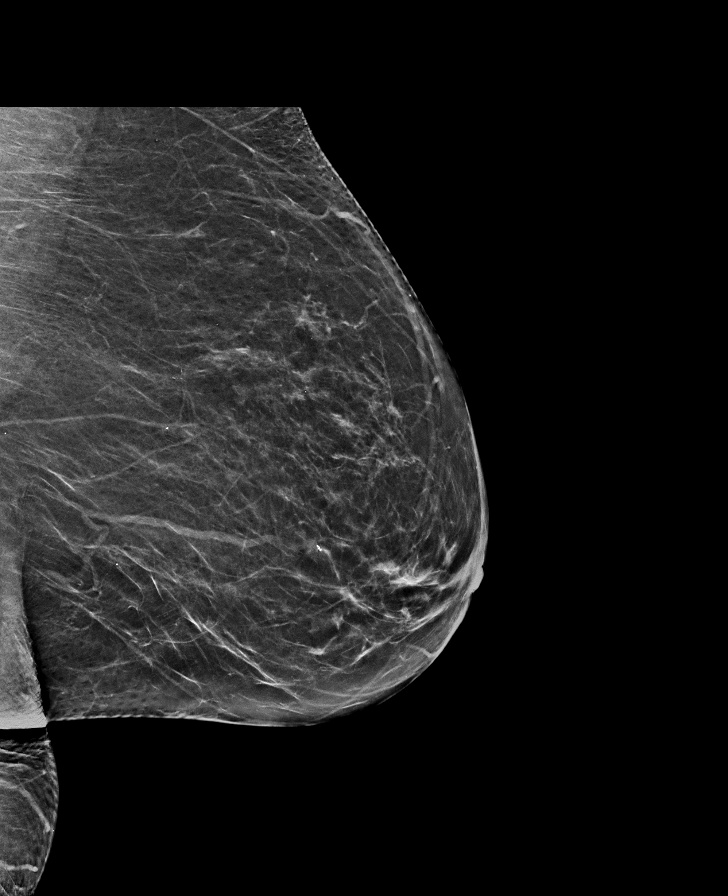

[R XCCL synth-2D]
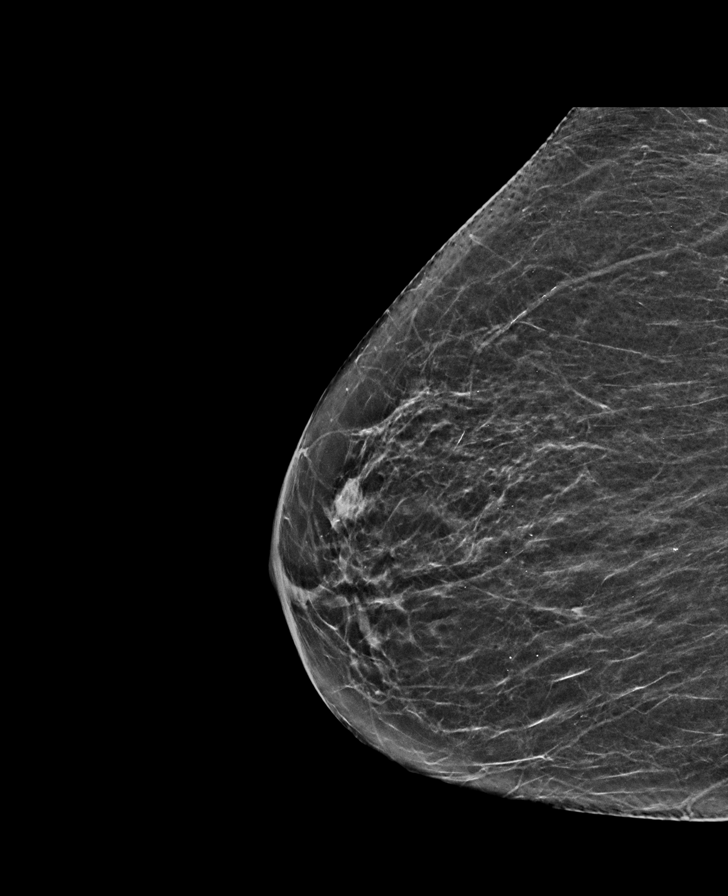

[L CC synth-2D]
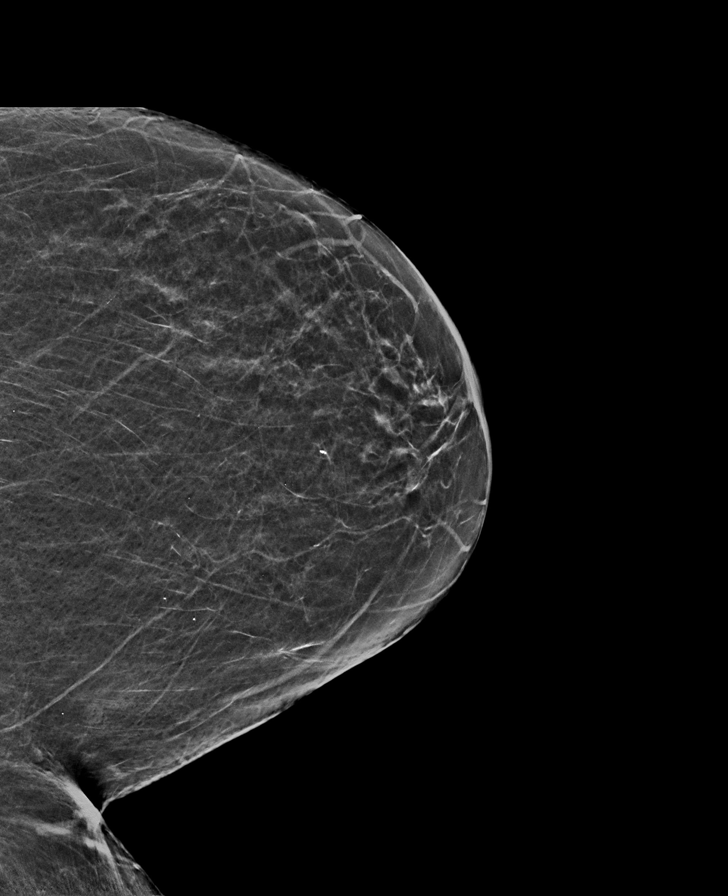

[R CC synth-2D]
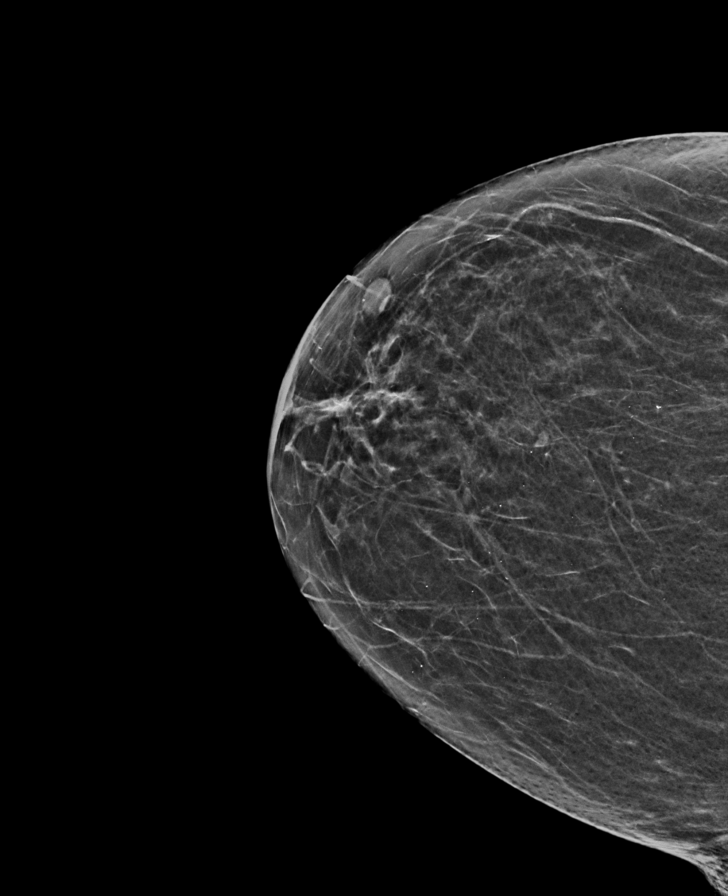

[R MLO synth-2D (2 of 2)]
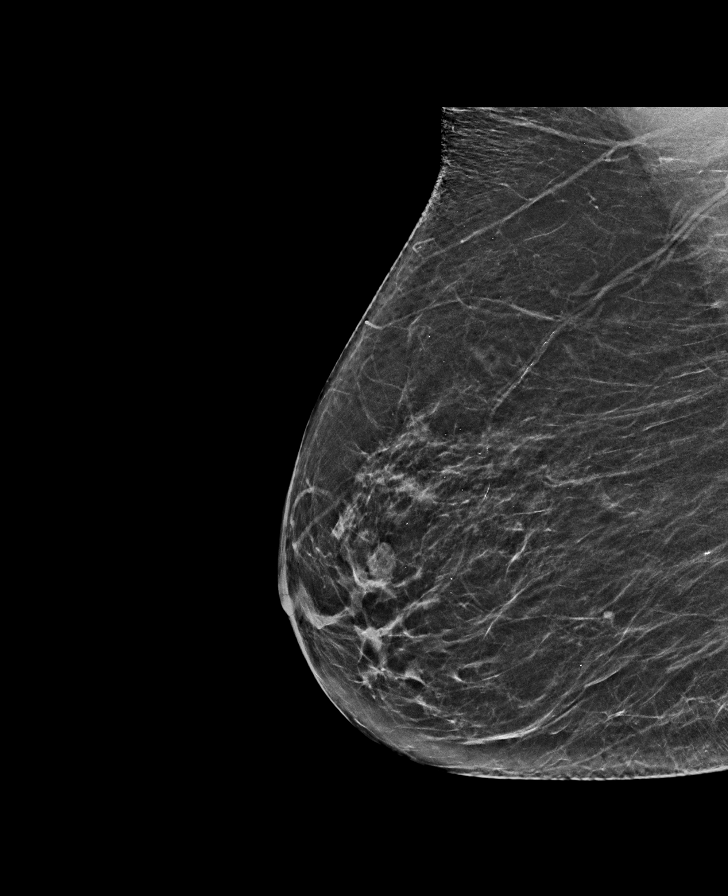

[R MLO tomo · tomo slice 31/62.0]
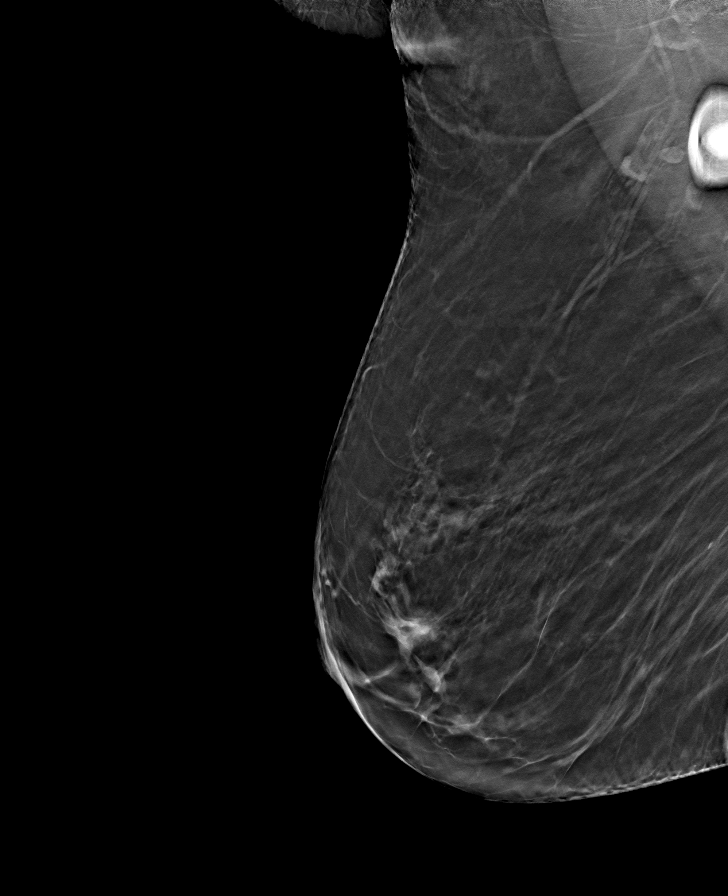

[8 of 40 positions shown; findings below may reference images not displayed]

ACR Breast Density Category b: There are scattered areas of
fibroglandular density.
FINDINGS: There are no findings suspicious for malignancy.
IMPRESSION: No mammographic evidence of malignancy. A result letter of this
screening mammogram will be mailed directly to the patient.

RECOMMENDATION:
Screening mammogram in one year. (Code:51-O-LD2)

BI-RADS CATEGORY  1: Negative.

## 2022-02-24 DIAGNOSIS — D6851 Activated protein C resistance: Secondary | ICD-10-CM

## 2022-02-24 DIAGNOSIS — I872 Venous insufficiency (chronic) (peripheral): Secondary | ICD-10-CM

## 2022-02-24 DIAGNOSIS — M810 Age-related osteoporosis without current pathological fracture: Secondary | ICD-10-CM

## 2022-02-24 DIAGNOSIS — E785 Hyperlipidemia, unspecified: Secondary | ICD-10-CM

## 2022-02-24 DIAGNOSIS — I10 Essential (primary) hypertension: Secondary | ICD-10-CM | POA: Diagnosis not present

## 2022-02-24 DIAGNOSIS — G620 Drug-induced polyneuropathy: Secondary | ICD-10-CM

## 2022-02-24 DIAGNOSIS — E1159 Type 2 diabetes mellitus with other circulatory complications: Secondary | ICD-10-CM | POA: Diagnosis not present

## 2022-02-24 DIAGNOSIS — I442 Atrioventricular block, complete: Secondary | ICD-10-CM

## 2022-02-24 DIAGNOSIS — K529 Noninfective gastroenteritis and colitis, unspecified: Secondary | ICD-10-CM

## 2022-02-24 DIAGNOSIS — F39 Unspecified mood [affective] disorder: Secondary | ICD-10-CM | POA: Diagnosis not present

## 2022-02-24 DIAGNOSIS — K219 Gastro-esophageal reflux disease without esophagitis: Secondary | ICD-10-CM

## 2022-02-24 DIAGNOSIS — E039 Hypothyroidism, unspecified: Secondary | ICD-10-CM

## 2022-02-24 DIAGNOSIS — N183 Chronic kidney disease, stage 3 unspecified: Secondary | ICD-10-CM | POA: Diagnosis not present

## 2022-03-02 NOTE — Unmapped (Signed)
[facility] Refill Coordination Note    Specialty Medication(s) to be Shipped:   Hematology/Oncology: Etoposide 50mg     Other medication(s) to be shipped: No additional medications requested for fill at this time     Patricia Perez, DOB: 16-Oct-1936  Phone: [phone] (home) [phone] (work)      All above HIPAA information was verified with patient's caregiver, Patricia Perez     Was a Nurse, learning disability used for this call? No    Completed refill call assessment today to schedule patient's medication shipment from the [facility] [phone]).  All relevant notes have been reviewed.     Specialty medication(s) and dose(s) confirmed: Regimen is correct and unchanged.   Changes to medications: Patricia Perez reports no changes at this time.  Changes to insurance: No  New side effects reported not previously addressed with a pharmacist or physician: None reported  Questions for the pharmacist: No    Confirmed patient received a Conservation officer, historic buildings and a Surveyor, mining with first shipment. The patient will receive a drug information handout for each medication shipped and additional FDA Medication Guides as required.       DISEASE/MEDICATION-SPECIFIC INFORMATION        N/A    SPECIALTY MEDICATION ADHERENCE     Medication Adherence    Patient reported X missed doses in the last month: 0  Specialty Medication: Etoposide 50mg   Patient is on additional specialty medications: No  Informant: caregiver              Were doses missed due to medication being on hold? No    Etoposide 50 mg: 0 days of medicine on hand       REFERRAL TO PHARMACIST     Referral to the pharmacist: Not needed      [facility]     Shipping address confirmed in Epic.     Delivery Scheduled: Yes, Expected medication delivery date: 03/09/22.     Medication will be delivered via Same Day Courier to the temporary address in Epic WAM.    Patricia Perez   [facility] Specialty Technician

## 2022-03-08 NOTE — Unmapped (Incomplete)
Specialty Medication Follow-up    Patricia Perez is a 86 y.o. female with recurrent ovarian yolk sac cancer who I am seeing for follow up on their treatment with oral etoposide.     Chemotherapy: Etoposide 50 mg PO daily days 1-14/28  Cycle date: Cycle 4 Day 1 (03/09/22)    A/P:   1. Oral Chemotherapy: CBC w/diff and CMP reviewed. Grade 1 anemia and grade 1 serum creatinine elevation both of which have worsened. Grade 1 thrombocytopenia which has improved. Patient meets treatment parameters therefore will start cycle 4 on 03/09/22. Will repeat labs in 2 weeks to evaluate hematologic toxicity while on therapy. ***  Continue etoposide 50 mg PO daily  Obtain CBC w/diff and CMP later this week.      I spent approximately *** minutes in direct patient care.    Next follow up: In 2 week with lab results (03/18/22)***    Referring physician: Dr. Mauricia Area, PharmD, BCOP, CPP  Gynecologic Oncology Clinic Pharmacist  Pager: [phone]      Labs (03/04/22)  WBC 4.1 x10*9/L   Hgb 10.3 g/dL   Plt 454 U98*1/X   ANC 2.7 x10*9/L   SCr 1.44 mg/dL   TBili 0.6 mg/dL   AST 13 U/L   ALT 10 U/L   Alk Phos 41 U/L     Gynecologic Oncology    Gynecologic Oncology Metrics:      Dose and Schedule: Etoposide 50 mg PO daily days 1-14/28      Chemotherapy Dose: Dose Documented     Chemotherapy Schedule: Schedule documented     NCI CTCAE: Anemia - Grade 1, Serum creatinine - Grade 1, Thrombocytopenia - Grade 1

## 2022-03-09 MED FILL — ETOPOSIDE 50 MG CAPSULE: ORAL | 28 days supply | Qty: 14 | Fill #3

## 2022-03-15 NOTE — Unmapped (Signed)
Specialty Medication Follow-up    Patricia Perez is a 86 y.o. female with recurrent ovarian yolk sac cancer who I am seeing for follow up on their treatment with oral etoposide.     Chemotherapy: Etoposide 50 mg PO daily days 1-14/28  Cycle date: Cycle 4 Day 1 (03/09/22)    A/P:   1. Oral Chemotherapy: No new labs to review. No grade 3 toxicities therefore will continue with current treatment plan. Will repeat labs later this week for hematologic toxicity assessment.   Continue etoposide 50 mg PO daily  Obtain CBC w/diff and CMP later this week.      I spent approximately 5 minutes in direct patient care.    Next follow up: In 1 week with lab results (03/22/22)    Referring physician: Dr. Mauricia Perez, PharmD, BCOP, CPP  Gynecologic Oncology Clinic Pharmacist  Pager: [phone]    S/O: Patricia Perez was contacted via myChart regarding recent lab results and oral chemotherapy toxicity assessment. Her daughter reports that overall she is doing well with no new complaints.     Labs (no new labs)

## 2022-03-23 DIAGNOSIS — S01302A Unspecified open wound of left ear, initial encounter: Secondary | ICD-10-CM | POA: Diagnosis not present

## 2022-03-23 NOTE — Unmapped (Signed)
Specialty Medication Follow-up    Patricia Perez is a 86 y.o. female with recurrent ovarian yolk sac cancer who I am seeing for follow up on their treatment with oral etoposide.     Chemotherapy: Etoposide 50 mg PO daily days 1-14/28  Cycle date: Cycle 4 Day 1 (03/09/22)    A/P:   1. Oral Chemotherapy: CBC w/diff and CMP reviewed. Grade 1 anemia, grade 1 thrombocytopenia, and grade 1 serum creatinine elevation all of which are stable. No grade 3 toxicities therefore will continue at current dose intensity. If continuing oral etoposide will repeat labs prior to next cycle which is scheduled to start 04/06/22.  From a tolerability standpoint ok to continue etoposide. Will review plan after review of upcoming imaging.      I spent approximately ---- minutes in direct patient care.    Next follow up: Pending next steps    Referring physician: Dr. Mauricia Area, PharmD, BCOP, CPP  Gynecologic Oncology Clinic Pharmacist  Pager: [phone]    Labs (03/18/22)  WBC 4.5 x10*9/L   Hgb 10.7 g/dL   Plt 086 V78*4/O   ANC 3.767 x10*9/L   SCr 1.5 mg/dL   TBili 0.6 mg/dL   AST 13 U/L   ALT 9 U/L   Alk Phos 43 U/L     Gynecologic Oncology    Gynecologic Oncology Metrics:      Dose and Schedule: Etoposide 50 mg PO daily days 1-14/28     Chemotherapy Dose: Dose Documented     Chemotherapy Schedule: Schedule documented     NCI CTCAE: Anemia - Grade 1, Thrombocytopenia - Grade 1, Serum creatinine - Grade 1

## 2022-03-24 ENCOUNTER — Ambulatory Visit: Admit: 2022-03-24 | Discharge: 2022-03-25 | Payer: MEDICARE

## 2022-03-29 ENCOUNTER — Ambulatory Visit: Admit: 2022-03-29 | Discharge: 2022-03-30 | Payer: MEDICARE

## 2022-03-29 DIAGNOSIS — M79661 Pain in right lower leg: Secondary | ICD-10-CM | POA: Diagnosis not present

## 2022-03-29 DIAGNOSIS — C561 Malignant neoplasm of right ovary: Principal | ICD-10-CM

## 2022-03-29 NOTE — Unmapped (Signed)
Referring Physician: Wilford Corner, Pa  [address] [facility]  [facility]  [place],  [place] [address]. [phone]    PCP: Verdell Carmine, PA      ESTABLISHED PATIENT NOTE    Assessment and Plan:  Patricia Perez has progression of disease manifested as liver mets and peritoneal disease. AFP also elevated.  We reviewed new findings and options for treatment from my discussion with Dr. Max Fickle.  We reviewed pros and cons of testing for MMR   and Patricia Perez decided to pursue testing with considerion of taking IO if biomarker positive.    Plan: Testing ordered.  Will let her know results when they return.      I reviewed Patricia Perez old records and labs from [facility] since her last visit with me, see summary below.         History of Present Illness:   Patricia Perez is a 86 y.o. woman.  Oncology History Overview Note   12/15/20:  Exploratory laparotomy, bilateral salpingo-oophorectomy, R pelvic and para-aortic lymphadenectomy, omentectomy, peritoneal biopsies, oversew of partial thickness sigmoid colon injury.   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 Recommend: BEP treatment x 3.  LDH is not a marker for her but AFP is (440).    01/12/21-03/06/21 C 1-3 BEP.  AFP 5/22=22    04/02/21 CTches/ab/ pelvis: no recurrence but in pelvis: Interval sequela of bilateral salpingo-oophorectomy and resection of a previously identified right adnexal mass. There is indeterminate hypoattenuating 3.3 cm right pelvic sidewall lesion which in the postoperative setting may represent a seroma/lymphocele, however residual disease is not entirely excluded. Recommend short interval follow up.  Plan f/up CT ini 3 mos.    07/01/21 Ct --Similar-appearing indeterminate right pelvic sidewall hypoattenuating mass measuring up to 2.4 cm on remeasurement. No new sites of disease the abdomen or pelvis. Left lower quadrant omental nodularity has improved when compared to prior exam.     Yolk sac tumor of the ovary   12/15/2020 Initial Diagnosis    Yolk sac tumor of the ovary     12/15/2020 Surgery    Exploratory laparotomy, bilateral salpingo-oophorectomy, R pelvic and para-aortic lymphadenectomy, omentectomy, peritoneal biopsies     12/15/2020 -  Cancer Staged    Staging form: Ovary, AJCC 7th Edition  - Clinical stage from 12/15/2020: FIGO Stage IC3, calculated as Stage IC (T1c, N0, M0) - Signed by Marlow Baars, MD on 12/31/2020       12/31/2020 Tumor Board    Stage: Stage IC3 yolk sac tumor     Plan: Patient-centered discussion. Standard of care is BEP chemotherapy 3 to 4 cycles.      01/12/2021 - 01/16/2021 Chemotherapy    IP/OP OVARIAN EP (YOLK SAC TUMOR NO PRIOR XRT)  BLEOmycin 20 units/m2 IV on day 1, etoposide 75 mg/m2 IV on days 1-5, CISplatin 20 mg/m2 IV on days 1-5, every 21 days     02/09/2021 -  Chemotherapy    OP Ovarian Yolk sac EP (ETOPOSIDE/CISPLATIN)  BLEOmycin 30 units IV on days 2, 9, 16, etoposide 100 mg/m2 IV on days 1-5, CISplatin 20 mg/m2 days 1-5, every 21 days     07/01/2021 Interval Scan(s)    Ct ab/pelvis: stable findings. --Similar-appearing indeterminate right pelvic sidewall hypoattenuating mass measuring up to 2.4 cm on remeasurement. No new sites of disease the abdomen or pelvis.   Left lower quadrant omental nodularity has improved when compared to prior exam.      Interval Scan(s)  07/07/21 AFP increasing to 97.  Pet ordered to clarify earlier CT and showed possible recurrence: Hypermetabolic soft tissue density lesion in the right pelvis which may represent recurrent disease. Consider correlation with MRI.     -Focal intense uptake is seen in the rectosigmoid junction, concerning but not definitive for a malignant or benign lesion. Recommend clinical correlation. Consider colonoscopy or MRI.     07/30/2021 Interval Scan(s)    PET: Hypermetabolic soft tissue density lesion in the right pelvis which may represent recurrent disease. Consider correlation with MRI.(Was 2 cm on CT)     -Focal intense uptake is seen in the rectosigmoid junction, concerning but not definitive for a malignant or benign lesion. Recommend clinical correlation. Consider colonoscopy or MRI.     -Posterior body wall stranding around the sacrum which may represent early pressure injury development. Recommend clinical.     09/17/2021 Recurrence    CT-guided biopsy of pelvic mass with recurrent yolk sac tumor     10/14/2021 Tumor Board    Stage: recurrent yolk sac tumor    Plan: Given oligometastatic disease, if functional status would allow, consider surgical resection. However, given that she is wheelchair bound and imaging with concern for sacral pressure ulcer, patient may not be a surgical candidate.   - If not offering resection, consider salvage chemotherapy. NCCN guidelines suggest that paclitaxel/ifosfamide/cisplatin is the preferred curative regimen for recurrent germ cell tumors. However, could also consider regimens such as carboplatin/paclitaxel, or single agent regimens such as taxotere or paclitaxel, depending on performance status  - Colonoscopy to evaluate hypermetabolic areas in rectosigmoid and transverse colon on PET     11/09/2021 -  Chemotherapy    Started oral etoposide 50 mg qd     11/27/2021 Interval Scan(s)    Bas[place] CT:    1.Enlarging, now 2.2 cm soft tissue density in the right pelvis which corresponds to an area that was FDG avid on recent PET/CT scan and could represent disease recurrence.  2.Area of FDG avidity in the rectosigmoid junction on recent PET/CT is not identified on this examination. Correlation with colonoscopy could be considered if clinically indicated.  3.Previously described hypoattenuating 2.6 cm right pelvic sidewall lesion with central hypoattenuation, is slightly increased in size and may represent a cystic lesion versus lymph node.     03/24/2022 Interval Scan(s)    After C3 oral etoposide, progression noted.  --Multiple new hepatic hypodensities, suspicious for metastatic disease.   --Increasing soft tissue nodularity along the posterior right pelvic sidewall at the site of known recurrent disease.   --Focal area of bowel wall thickening in the low pelvis at the distal sigmoid colon, suspicious for serosal implant. Correlation with colonoscopy can be considered.          Patricia Perez is here for discussion of management. After C3 etoposide, Ct showed progression of disease. AFP also elevated=1677.  She tolerated oral therapy. Her ambulatory status is unchanged.    Medical/Surgical History:  Medical problems and medications are reviewed and updated in EPIC.    Past Medical History:   Diagnosis Date   ??? Colitis    ??? Depression (emotion)    ??? Diabetes mellitus (CMS-[name])    ??? Disease of thyroid gland    ??? Hypertension    ??? Hypothyroidism (acquired)      Past Surgical History:   Procedure Laterality Date   ??? ANKLE SURGERY     ??? hysterectomy      ??? IR INSERT PORT AGE GREATER THAN  5 YRS  01/19/2021    IR INSERT PORT AGE GREATER THAN 5 YRS 01/19/2021 Ammie Dalton, MD [facility]   ??? IR INSERT PORT AGE LESS THAN 5 YRS  01/19/2021    IR INSERT PORT AGE LESS THAN 5 YRS 01/19/2021 Ammie Dalton, MD [facility]   ??? LIPOMA RESECTION     ??? PR OOPH W/RADIC DISSECT FOR DEBULKING Right 12/15/2020    Procedure: RESECTION (INITIAL) OVARIAN, TUBAL/PRIM PERITONEAL MALIG W/BIL S&O/OMENTECT; W/RAD DISSECTION FOR DEBULKING;  Surgeon: Haze Boyden, MD;  Location: MAIN OR [facility];  Service: Gynecology Oncology   ??? TONSILLECTOMY           Social History:   Social History     Social History Narrative   ??? Not on file     See above    Physical Exam:  BP 133/70  - Pulse 65  - Temp 36.4 ??C (97.6 ??F) (Temporal)  - Resp 18  - Ht 170.2 cm (5' 7)  - Wt 89.6 kg (197 lb 9.6 oz)  - SpO2 100%  - BMI 30.95 kg/m??   General: Patient is comfortable appearing. Confined to wheelchair. PS 3/4 Patricia Perez present. HEr daughter is present.          Verlin Dike, MD

## 2022-03-30 NOTE — Unmapped (Signed)
Patient is no longer taking Etoposide, will forward information to pharmacist.

## 2022-03-30 NOTE — Unmapped (Signed)
Specialty Medication(s): etoposide    Patricia Perez has been dis-enrolled from the [facility] specialty pharmacy services due to medication discontinuation resulting from progression of disease.    Additional information provided to the patient: no    Rollen Sox  [facility] Specialty Pharmacist

## 2022-04-23 ENCOUNTER — Emergency Department: Payer: Medicare Other

## 2022-04-23 ENCOUNTER — Inpatient Hospital Stay
Admission: EM | Admit: 2022-04-23 | Discharge: 2022-04-28 | DRG: 871 | Disposition: A | Discharge status: Skilled Nursing Facility | Payer: Medicare Other | Source: Skilled Nursing Facility | Attending: Internal Medicine | Admitting: Internal Medicine

## 2022-04-23 ENCOUNTER — Other Ambulatory Visit: Payer: Self-pay

## 2022-04-23 DIAGNOSIS — N390 Urinary tract infection, site not specified: Secondary | ICD-10-CM | POA: Diagnosis not present

## 2022-04-23 DIAGNOSIS — E782 Mixed hyperlipidemia: Secondary | ICD-10-CM | POA: Diagnosis present

## 2022-04-23 DIAGNOSIS — G459 Transient cerebral ischemic attack, unspecified: Secondary | ICD-10-CM | POA: Diagnosis present

## 2022-04-23 DIAGNOSIS — Z20822 Contact with and (suspected) exposure to covid-19: Secondary | ICD-10-CM | POA: Diagnosis present

## 2022-04-23 DIAGNOSIS — E785 Hyperlipidemia, unspecified: Secondary | ICD-10-CM | POA: Diagnosis present

## 2022-04-23 DIAGNOSIS — E870 Hyperosmolality and hypernatremia: Secondary | ICD-10-CM | POA: Diagnosis not present

## 2022-04-23 DIAGNOSIS — J9690 Respiratory failure, unspecified, unspecified whether with hypoxia or hypercapnia: Secondary | ICD-10-CM | POA: Diagnosis present

## 2022-04-23 DIAGNOSIS — I129 Hypertensive chronic kidney disease with stage 1 through stage 4 chronic kidney disease, or unspecified chronic kidney disease: Secondary | ICD-10-CM | POA: Diagnosis present

## 2022-04-23 DIAGNOSIS — D6859 Other primary thrombophilia: Secondary | ICD-10-CM | POA: Diagnosis present

## 2022-04-23 DIAGNOSIS — Z79890 Hormone replacement therapy (postmenopausal): Secondary | ICD-10-CM | POA: Diagnosis not present

## 2022-04-23 DIAGNOSIS — Z87891 Personal history of nicotine dependence: Secondary | ICD-10-CM

## 2022-04-23 DIAGNOSIS — E1122 Type 2 diabetes mellitus with diabetic chronic kidney disease: Secondary | ICD-10-CM | POA: Diagnosis present

## 2022-04-23 DIAGNOSIS — Z79899 Other long term (current) drug therapy: Secondary | ICD-10-CM

## 2022-04-23 DIAGNOSIS — G9341 Metabolic encephalopathy: Secondary | ICD-10-CM | POA: Diagnosis present

## 2022-04-23 DIAGNOSIS — A419 Sepsis, unspecified organism: Principal | ICD-10-CM | POA: Diagnosis present

## 2022-04-23 DIAGNOSIS — C569 Malignant neoplasm of unspecified ovary: Secondary | ICD-10-CM | POA: Diagnosis not present

## 2022-04-23 DIAGNOSIS — D638 Anemia in other chronic diseases classified elsewhere: Secondary | ICD-10-CM | POA: Diagnosis present

## 2022-04-23 DIAGNOSIS — D696 Thrombocytopenia, unspecified: Secondary | ICD-10-CM | POA: Diagnosis present

## 2022-04-23 DIAGNOSIS — J189 Pneumonia, unspecified organism: Secondary | ICD-10-CM | POA: Diagnosis present

## 2022-04-23 DIAGNOSIS — I10 Essential (primary) hypertension: Secondary | ICD-10-CM | POA: Diagnosis present

## 2022-04-23 DIAGNOSIS — N179 Acute kidney failure, unspecified: Secondary | ICD-10-CM | POA: Diagnosis present

## 2022-04-23 DIAGNOSIS — N1832 Chronic kidney disease, stage 3b: Secondary | ICD-10-CM | POA: Diagnosis present

## 2022-04-23 DIAGNOSIS — Z7901 Long term (current) use of anticoagulants: Secondary | ICD-10-CM

## 2022-04-23 DIAGNOSIS — Z66 Do not resuscitate: Secondary | ICD-10-CM | POA: Diagnosis present

## 2022-04-23 DIAGNOSIS — E876 Hypokalemia: Secondary | ICD-10-CM | POA: Diagnosis not present

## 2022-04-23 DIAGNOSIS — J9602 Acute respiratory failure with hypercapnia: Secondary | ICD-10-CM | POA: Diagnosis present

## 2022-04-23 DIAGNOSIS — I82409 Acute embolism and thrombosis of unspecified deep veins of unspecified lower extremity: Secondary | ICD-10-CM | POA: Diagnosis present

## 2022-04-23 DIAGNOSIS — R410 Disorientation, unspecified: Secondary | ICD-10-CM | POA: Diagnosis not present

## 2022-04-23 DIAGNOSIS — Z86718 Personal history of other venous thrombosis and embolism: Secondary | ICD-10-CM

## 2022-04-23 DIAGNOSIS — R6520 Severe sepsis without septic shock: Secondary | ICD-10-CM | POA: Diagnosis not present

## 2022-04-23 DIAGNOSIS — E039 Hypothyroidism, unspecified: Secondary | ICD-10-CM | POA: Diagnosis present

## 2022-04-23 DIAGNOSIS — J9601 Acute respiratory failure with hypoxia: Secondary | ICD-10-CM

## 2022-04-23 DIAGNOSIS — I82401 Acute embolism and thrombosis of unspecified deep veins of right lower extremity: Secondary | ICD-10-CM | POA: Diagnosis present

## 2022-04-23 DIAGNOSIS — E119 Type 2 diabetes mellitus without complications: Secondary | ICD-10-CM

## 2022-04-23 LAB — CBC WITH DIFFERENTIAL/PLATELET
Abs Immature Granulocytes: 0.04 10*3/uL (ref 0.00–0.07)
Basophils Absolute: 0 10*3/uL (ref 0.0–0.1)
Basophils Relative: 0 %
Eosinophils Absolute: 0 10*3/uL (ref 0.0–0.5)
Eosinophils Relative: 0 %
HCT: 32.2 % — ABNORMAL LOW (ref 36.0–46.0)
Hemoglobin: 9.7 g/dL — ABNORMAL LOW (ref 12.0–15.0)
Immature Granulocytes: 0 %
Lymphocytes Relative: 4 %
Lymphs Abs: 0.4 10*3/uL — ABNORMAL LOW (ref 0.7–4.0)
MCH: 29.5 pg (ref 26.0–34.0)
MCHC: 30.1 g/dL (ref 30.0–36.0)
MCV: 97.9 fL (ref 80.0–100.0)
Monocytes Absolute: 0.9 10*3/uL (ref 0.1–1.0)
Monocytes Relative: 9 %
Neutro Abs: 8.6 10*3/uL — ABNORMAL HIGH (ref 1.7–7.7)
Neutrophils Relative %: 87 %
Platelets: 128 10*3/uL — ABNORMAL LOW (ref 150–400)
RBC: 3.29 MIL/uL — ABNORMAL LOW (ref 3.87–5.11)
RDW: 18.5 % — ABNORMAL HIGH (ref 11.5–15.5)
WBC: 9.9 10*3/uL (ref 4.0–10.5)
nRBC: 0 % (ref 0.0–0.2)

## 2022-04-23 LAB — PROTIME-INR
INR: 1.2 (ref 0.8–1.2)
Prothrombin Time: 15.1 seconds (ref 11.4–15.2)

## 2022-04-23 LAB — COMPREHENSIVE METABOLIC PANEL
ALT: 13 U/L (ref 0–44)
AST: 16 U/L (ref 15–41)
Albumin: 3.2 g/dL — ABNORMAL LOW (ref 3.5–5.0)
Alkaline Phosphatase: 47 U/L (ref 38–126)
Anion gap: 6 (ref 5–15)
BUN: 39 mg/dL — ABNORMAL HIGH (ref 8–23)
CO2: 26 mmol/L (ref 22–32)
Calcium: 8 mg/dL — ABNORMAL LOW (ref 8.9–10.3)
Chloride: 110 mmol/L (ref 98–111)
Creatinine, Ser: 1.87 mg/dL — ABNORMAL HIGH (ref 0.44–1.00)
GFR, Estimated: 26 mL/min — ABNORMAL LOW (ref >60)
Glucose, Bld: 133 mg/dL — ABNORMAL HIGH (ref 70–99)
Potassium: 3.6 mmol/L (ref 3.5–5.1)
Sodium: 142 mmol/L (ref 135–145)
Total Bilirubin: 0.8 mg/dL (ref 0.3–1.2)
Total Protein: 5.6 g/dL — ABNORMAL LOW (ref 6.5–8.1)

## 2022-04-23 LAB — APTT: aPTT: 29 seconds (ref 24–36)

## 2022-04-23 LAB — LACTIC ACID, PLASMA: Lactic Acid, Venous: 1.2 mmol/L (ref 0.5–1.9)

## 2022-04-23 LAB — RESP PANEL BY RT-PCR (FLU A&B, COVID) ARPGX2
Influenza A by PCR: NEGATIVE
Influenza B by PCR: NEGATIVE
SARS Coronavirus 2 by RT PCR: NEGATIVE

## 2022-04-23 LAB — BRAIN NATRIURETIC PEPTIDE: B Natriuretic Peptide: 311.9 pg/mL — ABNORMAL HIGH (ref 0.0–100.0)

## 2022-04-23 LAB — CBG MONITORING, ED
Glucose-Capillary: 124 mg/dL — ABNORMAL HIGH (ref 70–99)
Glucose-Capillary: 123 mg/dL — ABNORMAL HIGH (ref 70–99)

## 2022-04-23 LAB — PROCALCITONIN: Procalcitonin: 1.84 ng/mL

## 2022-04-23 MED ORDER — SODIUM CHLORIDE 0.9 % IV SOLN: 500.0000 mg | INTRAVENOUS | Status: DC

## 2022-04-23 MED ORDER — INSULIN ASPART 100 UNIT/ML IJ SOLN
0.0000 [IU] | Freq: Three times a day (TID) | INTRAMUSCULAR | Status: DC
  Administered 2022-04-23: 1 [IU] via SUBCUTANEOUS
  Administered 2022-04-24 – 2022-04-27 (×9): 2 [IU] via SUBCUTANEOUS
  Administered 2022-04-28: 7 [IU] via SUBCUTANEOUS
  Administered 2022-04-28: 2 [IU] via SUBCUTANEOUS
  Filled 2022-04-23 (×11): qty 1

## 2022-04-23 MED ORDER — ACETAMINOPHEN 650 MG RE SUPP: 650.0000 mg | Freq: Four times a day (QID) | RECTAL | Status: DC | PRN

## 2022-04-23 MED ORDER — ATORVASTATIN CALCIUM 20 MG PO TABS
40.0000 mg | ORAL_TABLET | Freq: Every day | ORAL | Status: DC
  Administered 2022-04-23 – 2022-04-27 (×3): 40 mg via ORAL
  Filled 2022-04-23 (×4): qty 2

## 2022-04-23 MED ORDER — SENNOSIDES-DOCUSATE SODIUM 8.6-50 MG PO TABS: 1.0000 | ORAL_TABLET | Freq: Every evening | ORAL | Status: DC | PRN

## 2022-04-23 MED ORDER — THIAMINE HCL 100 MG PO TABS
100.0000 mg | ORAL_TABLET | Freq: Every day | ORAL | Status: DC
  Administered 2022-04-26 – 2022-04-28 (×3): 100 mg via ORAL
  Filled 2022-04-23 (×5): qty 1

## 2022-04-23 MED ORDER — ACETAMINOPHEN 325 MG PO TABS: 650.0000 mg | ORAL_TABLET | Freq: Four times a day (QID) | ORAL | Status: DC | PRN

## 2022-04-23 MED ORDER — MESALAMINE ER 0.375 G PO CP24
1.5000 g | ORAL_CAPSULE | Freq: Every day | ORAL | Status: DC
Start: 2022-04-23 — End: 2022-04-23

## 2022-04-23 MED ORDER — FAMOTIDINE 20 MG PO TABS
10.0000 mg | ORAL_TABLET | Freq: Every day | ORAL | Status: DC
  Administered 2022-04-23 – 2022-04-27 (×3): 10 mg via ORAL
  Filled 2022-04-23 (×4): qty 1

## 2022-04-23 MED ORDER — SODIUM CHLORIDE 0.9 % IV BOLUS (SEPSIS)
1000.0000 mL | Freq: Once | INTRAVENOUS | Status: AC
  Administered 2022-04-23: 1000 mL via INTRAVENOUS

## 2022-04-23 MED ORDER — DULOXETINE HCL 30 MG PO CPEP
30.0000 mg | ORAL_CAPSULE | Freq: Every day | ORAL | Status: DC
  Administered 2022-04-23 – 2022-04-28 (×4): 30 mg via ORAL
  Filled 2022-04-23 (×6): qty 1

## 2022-04-23 MED ORDER — HYDRALAZINE HCL 20 MG/ML IJ SOLN
5.0000 mg | Freq: Four times a day (QID) | INTRAMUSCULAR | Status: DC | PRN
  Administered 2022-04-24 (×2): 5 mg via INTRAVENOUS
  Filled 2022-04-23 (×2): qty 1

## 2022-04-23 MED ORDER — MESALAMINE 1.2 G PO TBEC
2.4000 g | DELAYED_RELEASE_TABLET | Freq: Every day | ORAL | Status: DC
  Administered 2022-04-26 – 2022-04-28 (×3): 2.4 g via ORAL
  Filled 2022-04-23 (×5): qty 2

## 2022-04-23 MED ORDER — ONDANSETRON HCL 4 MG PO TABS: 4.0000 mg | ORAL_TABLET | Freq: Four times a day (QID) | ORAL | Status: DC | PRN

## 2022-04-23 MED ORDER — METFORMIN HCL 500 MG PO TABS: 1000.0000 mg | ORAL_TABLET | Freq: Every day | ORAL | Status: DC

## 2022-04-23 MED ORDER — SODIUM CHLORIDE 0.9 % IV SOLN: INTRAVENOUS | Status: DC

## 2022-04-23 MED ORDER — SODIUM CHLORIDE 0.9 % IV SOLN
2.0000 g | INTRAVENOUS | Status: AC
  Administered 2022-04-23 – 2022-04-27 (×5): 2 g via INTRAVENOUS
  Filled 2022-04-23: qty 2
  Filled 2022-04-23 (×2): qty 20
  Filled 2022-04-23: qty 2
  Filled 2022-04-23: qty 20

## 2022-04-23 MED ORDER — INSULIN ASPART 100 UNIT/ML IJ SOLN
0.0000 [IU] | Freq: Every day | INTRAMUSCULAR | Status: DC
  Administered 2022-04-27: 2 [IU] via SUBCUTANEOUS

## 2022-04-23 MED ORDER — SODIUM CHLORIDE 0.9 % IV SOLN: 2.0000 g | INTRAVENOUS | Status: DC

## 2022-04-23 MED ORDER — APIXABAN 2.5 MG PO TABS
2.5000 mg | ORAL_TABLET | Freq: Two times a day (BID) | ORAL | Status: DC
  Administered 2022-04-23 – 2022-04-28 (×6): 2.5 mg via ORAL
  Filled 2022-04-23 (×9): qty 1

## 2022-04-23 MED ORDER — ONDANSETRON HCL 4 MG/2ML IJ SOLN: 4.0000 mg | Freq: Four times a day (QID) | INTRAMUSCULAR | Status: DC | PRN

## 2022-04-23 MED ORDER — MELATONIN 5 MG PO TABS
2.5000 mg | ORAL_TABLET | Freq: Every day | ORAL | Status: DC
  Administered 2022-04-26 – 2022-04-27 (×2): 2.5 mg via ORAL
  Filled 2022-04-23 (×3): qty 1

## 2022-04-23 MED ORDER — MELATONIN 3 MG PO TABS
3.0000 mg | ORAL_TABLET | Freq: Every day | ORAL | Status: DC
  Filled 2022-04-23: qty 1

## 2022-04-23 MED ORDER — TECHNETIUM TO 99M ALBUMIN AGGREGATED
4.0800 | Freq: Once | INTRAVENOUS | Status: AC | PRN
  Administered 2022-04-23: 4.08 via INTRAVENOUS

## 2022-04-23 MED ORDER — LEVOTHYROXINE SODIUM 50 MCG PO TABS
75.0000 ug | ORAL_TABLET | Freq: Every day | ORAL | Status: DC
  Administered 2022-04-26 – 2022-04-28 (×3): 75 ug via ORAL
  Filled 2022-04-23 (×3): qty 1

## 2022-04-23 MED ORDER — SODIUM CHLORIDE 0.9 % IV SOLN
500.0000 mg | INTRAVENOUS | Status: DC
  Administered 2022-04-23 – 2022-04-26 (×4): 500 mg via INTRAVENOUS
  Filled 2022-04-23 (×3): qty 5
  Filled 2022-04-23: qty 500
  Filled 2022-04-23: qty 5

## 2022-04-23 MED ORDER — VITAMIN B-12 1000 MCG PO TABS
1000.0000 ug | ORAL_TABLET | Freq: Every day | ORAL | Status: DC
  Administered 2022-04-26 – 2022-04-28 (×3): 1000 ug via ORAL
  Filled 2022-04-23 (×5): qty 1

## 2022-04-23 NOTE — Consult Note (Signed)
CODE SEPSIS - PHARMACY COMMUNICATION  **Broad Spectrum Antibiotics should be administered within 1 hour of Sepsis diagnosis**  Time Code Sepsis Called/Page Received: 0939  Antibiotics Ordered: rocephin, azithromycin  Time of 1st antibiotic administration: 1000  Additional action taken by pharmacy: none  If necessary, Name of Provider/Nurse Contacted: n/a    Pearla Dubonnet ,PharmD Clinical Pharmacist  04/23/2022  10:16 AM

## 2022-04-23 NOTE — Assessment & Plan Note (Signed)
-   Continue Eliquis 

## 2022-04-23 NOTE — Sepsis Progress Note (Signed)
Elink monitoring code sepsis 

## 2022-04-23 NOTE — Assessment & Plan Note (Signed)
Continue to hold metformin.  Can go back on glipizide and Januvia as outpatient.  Last sugar elevated.  On sliding scale here while in the hospital.

## 2022-04-23 NOTE — Assessment & Plan Note (Addendum)
The patient had an elevated PCO2 of 83 on presentation.  Care facility is unable to do trilogy machine or BiPAP.  Patient will go home on home oxygen 2 L at night.  Patient able to come off oxygen during the day.

## 2022-04-23 NOTE — Assessment & Plan Note (Signed)
-   Status post resection, IV and then oral chemotherapy - Per daughter at bedside, as of about 2 weeks ago chemotherapy has been discontinued due to provider stating that the chemotherapy has been not been effective and there is evidence of continued spread - Patient has a follow-up appointment in a few weeks regarding final plans regarding the ovarian cancer - Continue follow-up with outpatient gynecologic oncologist

## 2022-04-23 NOTE — Assessment & Plan Note (Addendum)
Present on admission, met criteria with tachycardia tachypnea and possible UTI.  Patient completed antibiotics here in the hospital.

## 2022-04-23 NOTE — H&P (Addendum)
History and Physical   Erlean Mealor NWG:[id number] DOB: 1936/05/11 DOA: 04/23/2022  PCP: Venia Carbon, MD  Outpatient Specialists: Dr. Alinda Sierras, gynecologic oncology Patient coming from: [facility] via EMS  I have personally briefly reviewed patient's old medical records in [place].  Chief Concern: Altered mental status, hypoxia  HPI: 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  In the emergency department showed temperature of 97.9, respiration rate of 18, heart rate 61, blood pressure upon EMS arrival was 77/46 with gradual improvement to 94/60 and then 122/66.  SPO2 of 90% on room air and improved to 96% on 2 L nasal cannula.  Per report at [facility] patient has saturation of 85% on room air, nausea, vomiting, diarrhea, altered mental status.  Serum sodium 142, potassium 3.6, chloride 110, bicarb 26, BUN of 39, serum creatinine 1.87, GFR 26, nonfasting blood glucose 133, WBC 9.9, hemoglobin 9.7, platelets of 128.  Lactic acid was 1.2.  COVID/influenza A/influenza B PCR were negative.  ED treatment: Azithromycin, ceftriaxone 2 g IV, sodium chloride 1 L bolus.  At bedside patient was able to tell me her name and identify her daughter.  She was able to tell me her age and she knows she is in the hospital.  She appears to be in no acute distress.  No respiratory accessory muscle use in process.  Nasal cannula in place.  She was very tired and exhausted as she had minimal sleep last night and daughter provided most of the HPI.  Daughter states that on 04/22/2022,  patient has been having multiple episodes of vomiting up her food and explosive diarrhea.  EMS was called because she was not acting "right ".  Daughter states that it was reported she also had difficulty with her oxygen level.  Daughter denies known fever.  Patient denies being in pain or shortness of breath at this time.  Social history: Patient is currently from [facility].  And then  ROS: Constitutional: no weight change, no fever ENT/Mouth: no sore throat, no rhinorrhea Eyes: no eye pain, no vision changes Cardiovascular: no chest pain, no dyspnea,  no edema, no palpitations Respiratory: no cough, no sputum, no wheezing Gastrointestinal: no nausea, no vomiting, no diarrhea, no constipation Genitourinary: no urinary incontinence, no dysuria, no hematuria Musculoskeletal: no arthralgias, no myalgias Skin: no skin lesions, no pruritus, Neuro: + weakness, no loss of consciousness, no syncope Psych: no anxiety, no depression, + decrease appetite Heme/Lymph: no bruising, no bleeding  ED Course: Discussed with emergency medicine provider, patient requiring hospitalization for chief concerns of acute hypoxic respiratory failure.  Assessment/Plan  Principal Problem:   Acute hypoxemic respiratory failure ([name]) Active Problems:   Sepsis ([place])   TIA (transient ischemic attack)   Hypothyroidism   HTN (hypertension)   Deep vein thrombosis (DVT) of right lower extremity ([name])   HLD (hyperlipidemia)   Ovarian cancer ([name])   AKI (acute kidney injury) ([place])   Assessment and Plan:  * Acute hypoxemic respiratory failure ([name]) - Check procalcitonin and BNP - Presumptive diagnosis is community-acquired pneumonia/atypical pneumonia - Continue with ceftriaxone and azithromycin IV daily - Patient is maintaining appropriate MAP, given chest x-ray read of  cardio pulmonary congestion, no further IV fluid bolus indicated at this time - Sodium chloride 150 mL/h, 1 day ordered - If BNP is  elevated, I will order BiPAP and change that level to stepdown and continue sodium chloride 150 mL/h as patient met SIRS criteria on presentation - Goal MAP greater than 65  Sepsis ([place]) - Patient met SIRS criteria with increased heart rate, respiration rate and hypoxia - Etiology source work-up in progress - Blood cultures x2, procalcitonin, UA with urine culture have been ordered - Daughter states the patient has not urinated since being in the hospital despite IV fluid administration - Bladder scan has been ordered and I have discussed this with nursing staff and per nursing staff, the read was 32 - Admit to telemetry cardiac, inpatient  AKI (acute kidney injury) ([place]) On CKD 3 B - Mild AKI - Presumed secondary to GI loss in setting of nausea vomiting diarrhea - Status post sodium chloride 1 L bolus - Continue with sodium chloride 150 mL/h, IV, for 1 day - BMP in the a.m.  Ovarian cancer ([place]) - Status post resection, IV and then oral chemotherapy - Per daughter at bedside, as of about 2 weeks ago chemotherapy has been discontinued due to provider stating that the chemotherapy has been not been effective and there is evidence of continued spread - Patient has a follow-up appointment in a few weeks regarding final plans regarding the ovarian cancer - Continue follow-up with outpatient gynecologic oncologist  HLD (hyperlipidemia) - Atorvastatin 40 mg nightly  Deep vein thrombosis (DVT) of right lower extremity ([place]) - I have resumed home Eliquis 2.5 mg p.o. twice daily  Hypothyroidism - Levothyroxine 75 mcg daily before breakfast resumed  Chart reviewed.   DVT prophylaxis: Eliquis Code Status: DNR/DNI Diet: Heart healthy/carb modified Family Communication: Updated daughter [name] at bedside with patient's permission Disposition Plan: Pending clinical course Consults called: None at this time Admission status: Telemetry cardiac, inpatient  Past Medical History:  Diagnosis  Date   Anginal pain ([place])    Anxiety    Diabetes mellitus without complication ([place])    Hypertension    Hypothyroidism    Protein C deficiency ([place])    Protein S deficiency ([place])    Past Surgical History:  Procedure Laterality Date   ABDOMINAL SURGERY     BACK SURGERY     BREAST EXCISIONAL BIOPSY Left 2003?   benign   COLONOSCOPY N/A 09/12/2017   Procedure: COLONOSCOPY;  Surgeon: Lollie Sails, MD;  Location: [facility] ENDOSCOPY;  Service: Endoscopy;  Laterality: N/A;   COLONOSCOPY WITH PROPOFOL N/A 01/20/2016   Procedure: COLONOSCOPY WITH PROPOFOL;  Surgeon: Lollie Sails, MD;  Location: [facility] ENDOSCOPY;  Service: Endoscopy;  Laterality: N/A;   FRACTURE SURGERY     HIP ARTHROPLASTY Left 01/12/2019   Procedure: ARTHROPLASTY BIPOLAR HIP (HEMIARTHROPLASTY), LEFT;  Surgeon: Leim Fabry, MD;  Location: [facility] ORS;  Service: Orthopedics;  Laterality: Left;   PACEMAKER LEADLESS INSERTION N/A 09/20/2019   Procedure: PACEMAKER LEADLESS INSERTION;  Surgeon: Isaias Cowman, MD;  Location: [place] CV LAB;  Service: Cardiovascular;  Laterality: N/A;   PERIPHERAL VASCULAR CATHETERIZATION N/A 05/13/2015   Procedure: IVC Filter Removal;  Surgeon: Katha Cabal, MD;  Location: [place] CV LAB;  Service: Cardiovascular;  Laterality: N/A;   TEMPORARY PACEMAKER Right 09/20/2019   Procedure: TEMPORARY PACEMAKER;  Surgeon: Isaias Cowman, MD;  Location: [place] CV LAB;  Service: Cardiovascular;  Laterality: Right;   TONSILLECTOMY     Social History:  reports that she has quit smoking. She has never used smokeless tobacco. She reports that she does not drink alcohol and does not use drugs.  Allergies  Allergen Reactions   Gentamicin Other (See Comments)    Kidney function   Morphine And Related Other (See Comments)    AMS - agitation and delusions **No legal documents to be signed if taking, per family**   Family History  Problem Relation Age of Onset   Breast cancer  Sister    Family history: Family history reviewed and not pertinent  Prior to Admission medications   Medication Sig Start Date End Date Taking? Authorizing Provider  acetaminophen (TYLENOL) 325 MG tablet Take 650 mg by mouth every 6 (six) hours as needed for moderate pain or mild pain (pain).    [provider]  alendronate (FOSAMAX) 70 MG tablet Take 70 mg by mouth every Sunday.    [provider]  apixaban (ELIQUIS) 2.5 MG TABS tablet Take 2.5 mg by mouth 2 (two) times daily.    [provider]  APRISO 0.375 g 24 hr capsule Take 1.5 g by mouth daily at 12 noon.    [provider]  atorvastatin (LIPITOR) 40 MG tablet Take 40 mg by mouth daily.    [provider]  calcium carbonate (OSCAL) 1500 (600 Ca) MG TABS tablet Take 600 mg of elemental calcium by mouth daily.     [provider]  calcium-vitamin D (OSCAL WITH D) 500-5 MG-MCG tablet Take 1 tablet by mouth 2 (two) times daily.    [provider]  cholecalciferol (VITAMIN D3) 25 MCG (1000 UT) tablet Take 2,000 Units by mouth daily. (taken with calcium)    [provider]  diclofenac Sodium (VOLTAREN) 1 % GEL Apply 2 g topically 3 (three) times daily as needed.    [provider]  DULoxetine (CYMBALTA) 30 MG capsule Take 30 mg by mouth daily.    [provider]  furosemide (LASIX) 20 MG tablet Take 20 mg by mouth daily.    [provider]  glipiZIDE (GLUCOTROL XL) 2.5 MG 24 hr tablet Take 2.5 mg by mouth daily. 11/11/18   [provider]  levothyroxine (SYNTHROID) 75 MCG tablet Take 75 mcg by mouth daily before breakfast.    [provider]  melatonin 3 MG TABS tablet Take 3 mg by mouth at bedtime.    [provider]  metFORMIN (GLUCOPHAGE) 1000 MG tablet Take 1 tablet by mouth daily with supper.    [provider]  ondansetron (ZOFRAN) 4 MG tablet Take 4 mg by mouth every 8 (eight) hours as needed for nausea  or vomiting.    [provider]  pregabalin (LYRICA) 150 MG capsule Take 150 mg by mouth 2 (two) times daily.    [provider]  sitaGLIPtin (JANUVIA) 25 MG tablet Take 1 tablet by mouth daily.    [provider]  thiamine 100 MG tablet Take 100 mg by mouth daily.    [provider]  vitamin B-12 (CYANOCOBALAMIN) 1000 MCG tablet Take 1,000 mcg by mouth daily.    [provider]   Physical Exam: Vitals:   04/23/22 1230 04/23/22 1330 04/23/22 1430 04/23/22 1600  BP: 122/77 (!) 122/54 122/66 (!) 119/91  Pulse:   (!) 59   Resp: 15 (!) _0 Temp:      TempSrc:      SpO2:  98% 96%   Weight:      Height:  Constitutional: appears age-appropriate, frail, NAD, calm, comfortable Eyes: PERRL, lids and conjunctivae normal HENMT: Mucous membranes are moist. Posterior pharynx clear of any exudate or lesions. Age-appropriate dentition. Hearing appropriate.  Scalp hair loss consistent with history of chemotherapy Neck: normal, supple, no masses, no thyromegaly Respiratory: clear to auscultation bilaterally, no wheezing, no crackles. Normal respiratory effort. No accessory muscle use.  Cardiovascular: Regular rate and rhythm, no murmurs / rubs / gallops.  Bilateral 1+ extremity pitting edema. 2+ pedal pulses. No carotid bruits.  Abdomen: Morbidly obese abdomen, no tenderness, no masses palpated, no hepatosplenomegaly. Bowel sounds positive.  Musculoskeletal: no clubbing / cyanosis. No joint deformity upper and lower extremities. Good ROM, no contractures, no atrophy. Normal muscle tone.  Skin: no rashes, lesions, ulcers. No induration Neurologic: Sensation intact. Strength 5/5 in all 4.  Psychiatric: Normal judgment and insight. Alert and oriented x 3. Normal mood.   EKG: independently reviewed, showing sinus rhythm with rate of 65, QTc 531  Chest x-ray on Admission: I personally reviewed and I agree with radiologist reading as below.  NM  Pulmonary Perfusion  Result Date: 04/23/2022 CLINICAL DATA:  Pulmonary embolism suspected, high probability. Leg swelling and hypoxemia. EXAM: NUCLEAR MEDICINE PERFUSION LUNG SCAN TECHNIQUE: Perfusion images were obtained in multiple projections after intravenous injection of radiopharmaceutical. Ventilation scans intentionally deferred if perfusion scan and chest x-ray adequate for interpretation during COVID 19 epidemic. RADIOPHARMACEUTICALS:  4.08 mCi Tc-8mMAA IV COMPARISON:  Radiographs 04/23/2022 and 07/10/2020. FINDINGS: There are no wedge-shaped perfusion defects suspicious for pulmonary embolism. Central right lung defect on the RPO view attributed to the right hilum. IMPRESSION: Pulmonary embolism absent per PISAPED criteria. Electronically Signed   By: WRichardean SaleM.D.   On: 04/23/2022 14:46   UK[place]Venous Img Lower Bilateral  Result Date: 04/23/2022 CLINICAL DATA:  Pain and swelling EXAM: BILATERAL LOWER EXTREMITY VENOUS DOPPLER ULTRASOUND TECHNIQUE: Gray-scale sonography with graded compression, as well as color Doppler and duplex ultrasound were performed to evaluate the lower extremity deep venous systems from the level of the common femoral vein and including the common femoral, femoral, profunda femoral, popliteal and calf veins including the posterior tibial, peroneal and gastrocnemius veins when visible. The superficial great saphenous vein was also interrogated. Spectral Doppler was utilized to evaluate flow at rest and with distal augmentation maneuvers in the common femoral, femoral and popliteal veins. COMPARISON:  None Available. FINDINGS: RIGHT LOWER EXTREMITY Common Femoral Vein: No evidence of thrombus. Normal compressibility, respiratory phasicity and response to augmentation. Saphenofemoral Junction: No evidence of thrombus. Normal compressibility and flow on color Doppler imaging. Profunda Femoral Vein: No evidence of thrombus. Normal compressibility and flow on color Doppler  imaging. Femoral Vein: No evidence of thrombus. Normal compressibility, respiratory phasicity and response to augmentation. Popliteal Vein: No evidence of thrombus. Normal compressibility, respiratory phasicity and response to augmentation. Calf Veins: No evidence of thrombus. Normal compressibility and flow on color Doppler imaging. Superficial Great Saphenous Vein: No evidence of thrombus. Normal compressibility. Venous Reflux:  None. Other Findings:  None. LEFT LOWER EXTREMITY Common Femoral Vein: No evidence of thrombus. Normal compressibility, respiratory phasicity and response to augmentation. Saphenofemoral Junction: No evidence of thrombus. Normal compressibility and flow on color Doppler imaging. Profunda Femoral Vein: No evidence of thrombus. Normal compressibility and flow on color Doppler imaging. Femoral Vein: No evidence of thrombus. Normal compressibility, respiratory phasicity and response to augmentation. Popliteal Vein: No evidence of thrombus. Normal compressibility, respiratory phasicity and response to augmentation. Calf Veins: No evidence of thrombus. Normal  compressibility and flow on color Doppler imaging. Superficial Great Saphenous Vein: No evidence of thrombus. Normal compressibility. Venous Reflux:  None. Other Findings: There is edema in the subcutaneous plane in the calves, more so on the left side without any loculated fluid collections. IMPRESSION: No evidence of deep venous thrombosis in either lower extremity. Electronically Signed   By: Elmer Picker M.D.   On: 04/23/2022 13:23   DG Chest Port 1 View  Result Date: 04/23/2022 CLINICAL DATA:  Ulnar mental status.  Hypoxemia. EXAM: PORTABLE CHEST 1 VIEW COMPARISON:  Radiographs 07/10/2020 and 09/18/2019. FINDINGS: 1003 hours. Mild patient rotation to the right and lower lung volumes. Right IJ Port-A-Cath extends to the level of the superior cavoatrial junction. Interval mild enlargement of the heart which appears mildly  enlarged. There is vascular congestion without overt pulmonary edema, confluent airspace opacity, pneumothorax or significant pleural effusion. No acute osseous findings are evident. Telemetry leads overlie the chest. IMPRESSION: Cardiomegaly with mild vascular congestion. No overt edema or focal airspace disease demonstrated. Electronically Signed   By: Richardean Sale M.D.   On: 04/23/2022 10:08    Labs on Admission: I have personally reviewed following labs  CBC: Recent Labs  Lab 04/23/22 0957  WBC 9.9  NEUTROABS 8.6*  HGB 9.7*  HCT 32.2*  MCV 97.9  PLT 893*   Basic Metabolic Panel: Recent Labs  Lab 04/23/22 0957  NA 142  K 3.6  CL 110  CO2 26  GLUCOSE 133*  BUN 39*  CREATININE 1.87*  CALCIUM 8.0*   GFR: Estimated Creatinine Clearance: 26.1 mL/min (A) (by C-G formula based on SCr of 1.87 mg/dL (H)).  Liver Function Tests: Recent Labs  Lab 04/23/22 0957  AST 16  ALT 13  ALKPHOS 47  BILITOT 0.8  PROT 5.6*  ALBUMIN 3.2*   Coagulation Profile: Recent Labs  Lab 04/23/22 0957  INR 1.2   Urine analysis:    Component Value Date/Time   COLORURINE YELLOW (A) 01/17/2022 1022   APPEARANCEUR HAZY (A) 01/17/2022 1022   LABSPEC 1.019 01/17/2022 1022   PHURINE 5.0 01/17/2022 1022   GLUCOSEU NEGATIVE 01/17/2022 1022   HGBUR NEGATIVE 01/17/2022 1022   BILIRUBINUR NEGATIVE 01/17/2022 1022   KETONESUR NEGATIVE 01/17/2022 1022   PROTEINUR NEGATIVE 01/17/2022 1022   NITRITE POSITIVE (A) 01/17/2022 1022   LEUKOCYTESUR SMALL (A) 01/17/2022 1022   CRITICAL CARE Performed by: Briant Cedar Skylin Kennerson  Total critical care time: 35 minutes  Critical care time was exclusive of separately billable procedures and treating other patients.  Critical care was necessary to treat or prevent imminent or life-threatening deterioration.  Critical care was time spent personally by me on the following activities: development of treatment plan with patient and/or surrogate as well as nursing,  discussions with consultants, evaluation of patient's response to treatment, examination of patient, obtaining history from patient or surrogate, ordering and performing treatments and interventions, ordering and review of laboratory studies, ordering and review of radiographic studies, pulse oximetry and re-evaluation of patient's condition.  Dr. Tobie Poet [facility]  If 7PM-7AM, please contact overnight-coverage provider If 7AM-7PM, please contact day coverage provider [email or web address]  04/23/2022, 4:53 PM

## 2022-04-23 NOTE — ED Provider Notes (Addendum)
[facility] Provider Note    Event Date/Time   First MD Initiated Contact with Patient 04/23/22 [phone]     (approximate)   History   Chief Complaint: Altered Mental Status   HPI  Sierra [name] is a 86 y.o. female with a history of hypertension diabetes protein C and S deficiency on Eliquis who is brought to the ED today due to decreased energy level.  EMS found room air oxygen saturation to be 85%, blood pressure 77/46.  Patient reports that her blood pressure is normally low.  She does endorse some vomiting and diarrhea last night.  No black or bloody stools, no chest pain or shortness of breath or cough or fever.  Reports eating okay.     Physical Exam   Triage Vital Signs: ED Triage Vitals  Enc Vitals Group     BP 04/23/22 0912 (!) 82/69     Pulse Rate 04/23/22 0912 64     Resp 04/23/22 0912 18     Temp 04/23/22 0912 97.9 F (36.6 C)     Temp Source 04/23/22 0912 Oral     SpO2 04/23/22 0912 98 %     Weight 04/23/22 0909 195 lb (88.5 kg)     Height 04/23/22 0909 '5\' 7"'$  (1.702 m)     Head Circumference --      Peak Flow --      Pain Score --      Pain Loc --      Pain Edu? --      Excl. in [place]? --     Most recent vital signs: Vitals:   04/23/22 1330 04/23/22 1430  BP: (!) 122/54 122/66  Pulse:  (!) 59  Resp: (!) 21 20  Temp:    SpO2: 98% 96%    General: Awake, no distress.  CV:  Good peripheral perfusion.  Regular rate rhythm Resp:  Normal effort.  Clear to auscultation bilaterally Abd:  No distention.  Soft nontender Other:  1+ pitting edema bilateral lower extremities.  Symmetric calf circumference.  There is left calf tenderness.  No inflammatory soft tissue changes.  Dry mucous membranes.   ED Results / Procedures / Treatments   Labs (all labs ordered are listed, but only abnormal results are displayed) Labs Reviewed  COMPREHENSIVE METABOLIC PANEL - Abnormal; Notable for the following components:      Result Value   Glucose,  Bld 133 (*)    BUN 39 (*)    Creatinine, Ser 1.87 (*)    Calcium 8.0 (*)    Total Protein 5.6 (*)    Albumin 3.2 (*)    GFR, Estimated 26 (*)    All other components within normal limits  CBC WITH DIFFERENTIAL/PLATELET - Abnormal; Notable for the following components:   RBC 3.29 (*)    Hemoglobin 9.7 (*)    HCT 32.2 (*)    RDW 18.5 (*)    Platelets 128 (*)    Neutro Abs 8.6 (*)    Lymphs Abs 0.4 (*)    All other components within normal limits  RESP PANEL BY RT-PCR (FLU A&B, COVID) ARPGX2  CULTURE, BLOOD (ROUTINE X 2)  CULTURE, BLOOD (ROUTINE X 2)  URINE CULTURE  LACTIC ACID, PLASMA  PROTIME-INR  APTT  LACTIC ACID, PLASMA  URINALYSIS, COMPLETE (UACMP) WITH MICROSCOPIC     EKG Interpreted by me Ventricular paced rhythm, rate of 65.  Normal axis, left bundle branch block.  No acute ischemic changes   RADIOLOGY  Chest x-ray viewed and interpreted by me, appears normal.  Radiology report reviewed.   PROCEDURES:  .Critical Care  Performed by: Carrie Mew, MD Authorized by: Carrie Mew, MD   Critical care provider statement:    Critical care time (minutes):  35   Critical care time was exclusive of:  Separately billable procedures and treating other patients   Critical care was necessary to treat or prevent imminent or life-threatening deterioration of the following conditions:  Sepsis, respiratory failure and shock   Critical care was time spent personally by me on the following activities:  Development of treatment plan with patient or surrogate, discussions with consultants, evaluation of patient's response to treatment, examination of patient, obtaining history from patient or surrogate, ordering and performing treatments and interventions, ordering and review of laboratory studies, ordering and review of radiographic studies, pulse oximetry, re-evaluation of patient's condition and review of old [place] ED: Medications   cefTRIAXone (ROCEPHIN) 2 g in sodium chloride 0.9 % 100 mL IVPB (0 g Intravenous Stopped 04/23/22 1034)  azithromycin (ZITHROMAX) 500 mg in sodium chloride 0.9 % 250 mL IVPB (0 mg Intravenous Stopped 04/23/22 1143)  sodium chloride 0.9 % bolus 1,000 mL (0 mLs Intravenous Stopped 04/23/22 1143)  technetium albumin aggregated (MAA) injection solution 9.70 millicurie (2.63 millicuries Intravenous Contrast Given 04/23/22 1404)     IMPRESSION / MDM / [place] / ED COURSE  I reviewed the triage vital signs and the nursing notes.                              Differential diagnosis includes, but is not limited to, pneumonia, pleural effusion, pulmonary edema, UTI, sepsis, PE, non-STEMI  Patient's presentation is most consistent with acute presentation with potential threat to life or bodily function.  Patient presents with hypoxia, hypotension, concerning primarily for pneumonia.  Give IV fluids for hydration and she does clinically appear to be volume depleted.  Initial labs and chest x-ray unremarkable.  Will obtain nuclear medicine study (due to low GFR related to CKD) to evaluate for PE.   Clinical Course as of 04/23/22 1503  Fri Apr 23, 2022  1343 [place] negative for DVT [PS]    Clinical Course User Index [PS] Carrie Mew, MD    ----------------------------------------- 3:03 PM on 04/23/2022 ----------------------------------------- Nuclear medicine perfusion scan negative for PE   FINAL CLINICAL IMPRESSION(S) / ED DIAGNOSES   Final diagnoses:  Acute respiratory failure with hypoxia ([place])     Rx / DC Orders   ED Discharge Orders     None        Note:  This document was prepared using Dragon voice recognition software and may include unintentional dictation errors.   Carrie Mew, MD 04/23/22 [place], MD 04/23/22 1504

## 2022-04-23 NOTE — ED Notes (Signed)
Pt's daughter is concerned about pt having delirium. She has had it in the past and tends to have issues with it quickly

## 2022-04-23 NOTE — ED Notes (Signed)
Pt oxygen saturation reading 50%. DNR on counter and this RN unsure of pt POC. Pt primary RN made aware of oxygen reading.

## 2022-04-23 NOTE — Assessment & Plan Note (Signed)
Continue atorvastatin

## 2022-04-23 NOTE — ED Triage Notes (Signed)
[facility] reports pt coming from [facility] Staff states pt not acting normally this am. Pulse ox 85% on RA. Upon EMS arrival pt BP 77/46, pt states that's normal for her. Pt had n/v/d last night.

## 2022-04-23 NOTE — ED Notes (Signed)
Pt tx to Nuc Med.

## 2022-04-23 NOTE — ED Notes (Signed)
Bladder Scan 240m

## 2022-04-23 NOTE — Assessment & Plan Note (Signed)
On CKD 3 B Last creatinine 1.32 with a GFR of 40. Recommend checking BMP and then can consider restarting Lasix.

## 2022-04-23 NOTE — Progress Notes (Incomplete)
CODE SEPSIS - PHARMACY COMMUNICATION  **Broad Spectrum Antibiotics should be administered within 1 hour of Sepsis diagnosis**  Time Code Sepsis Called/Page Received: 7903  Antibiotics Ordered: Ceftriaxone + azithromycin  Time of 1st antibiotic administration: ***  Additional action taken by pharmacy: Benita Gutter  04/23/2022  9:35 AM

## 2022-04-23 NOTE — Assessment & Plan Note (Signed)
Can go back on lisinopril.  Hold Lasix for right now

## 2022-04-23 NOTE — ED Notes (Signed)
Pt transported to nuclear medicine. 

## 2022-04-23 NOTE — Assessment & Plan Note (Addendum)
Continue levothyroxine 

## 2022-04-23 NOTE — Hospital Course (Addendum)
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  In the emergency department showed temperature of 97.9, respiration rate of 18, heart rate 61, blood pressure upon EMS arrival was 77/46 with gradual improvement to 94/60 and then 122/66.  SPO2 of 90% on room air and improved to 96% on 2 L nasal cannula.  Per report at [facility] patient has saturation of 85% on room air, nausea, vomiting, diarrhea, altered mental status.  Serum sodium 142, potassium 3.6, chloride 110, bicarb 26, BUN of 39, serum creatinine 1.87, GFR 26, nonfasting blood glucose 133, WBC 9.9, hemoglobin 9.7, platelets of 128.  Lactic acid was 1.2.  COVID/influenza A/influenza B PCR were negative.  ED treatment: Azithromycin, ceftriaxone 2 g IV, sodium chloride 1 L bolus.

## 2022-04-24 ENCOUNTER — Inpatient Hospital Stay: Payer: Medicare Other

## 2022-04-24 DIAGNOSIS — A419 Sepsis, unspecified organism: Secondary | ICD-10-CM

## 2022-04-24 DIAGNOSIS — N390 Urinary tract infection, site not specified: Secondary | ICD-10-CM

## 2022-04-24 DIAGNOSIS — J9602 Acute respiratory failure with hypercapnia: Secondary | ICD-10-CM

## 2022-04-24 DIAGNOSIS — J9690 Respiratory failure, unspecified, unspecified whether with hypoxia or hypercapnia: Secondary | ICD-10-CM | POA: Diagnosis present

## 2022-04-24 DIAGNOSIS — N179 Acute kidney failure, unspecified: Secondary | ICD-10-CM | POA: Diagnosis not present

## 2022-04-24 DIAGNOSIS — J9601 Acute respiratory failure with hypoxia: Secondary | ICD-10-CM | POA: Diagnosis not present

## 2022-04-24 LAB — PROTIME-INR
INR: 1.3 — ABNORMAL HIGH (ref 0.8–1.2)
Prothrombin Time: 15.6 seconds — ABNORMAL HIGH (ref 11.4–15.2)

## 2022-04-24 LAB — URINALYSIS, COMPLETE (UACMP) WITH MICROSCOPIC
Bilirubin Urine: NEGATIVE
Glucose, UA: NEGATIVE mg/dL
Hgb urine dipstick: NEGATIVE
Ketones, ur: NEGATIVE mg/dL
Nitrite: POSITIVE — AB
Protein, ur: 30 mg/dL — AB
Specific Gravity, Urine: 1.02 (ref 1.005–1.030)
WBC, UA: >50 WBC/hpf — ABNORMAL HIGH (ref 0–5)
pH: 5 (ref 5.0–8.0)

## 2022-04-24 LAB — BLOOD GAS, ARTERIAL
Acid-base deficit: 7.2 mmol/L — ABNORMAL HIGH (ref 0.0–2.0)
Bicarbonate: 24.6 mmol/L (ref 20.0–28.0)
O2 Content: 3 L/min
O2 Saturation: 89.4 %
Patient temperature: 37
pCO2 arterial: 83 mmHg (ref 32–48)
pH, Arterial: 7.08 — CL (ref 7.35–7.45)
pO2, Arterial: 64 mmHg — ABNORMAL LOW (ref 83–108)

## 2022-04-24 LAB — LACTIC ACID, PLASMA: Lactic Acid, Venous: 2.3 mmol/L (ref 0.5–1.9)

## 2022-04-24 LAB — BASIC METABOLIC PANEL
Anion gap: 8 (ref 5–15)
BUN: 33 mg/dL — ABNORMAL HIGH (ref 8–23)
CO2: 25 mmol/L (ref 22–32)
Calcium: 8.4 mg/dL — ABNORMAL LOW (ref 8.9–10.3)
Chloride: 111 mmol/L (ref 98–111)
Creatinine, Ser: 1.71 mg/dL — ABNORMAL HIGH (ref 0.44–1.00)
GFR, Estimated: 29 mL/min — ABNORMAL LOW (ref >60)
Glucose, Bld: 222 mg/dL — ABNORMAL HIGH (ref 70–99)
Potassium: 4 mmol/L (ref 3.5–5.1)
Sodium: 144 mmol/L (ref 135–145)

## 2022-04-24 LAB — CBG MONITORING, ED: Glucose-Capillary: 197 mg/dL — ABNORMAL HIGH (ref 70–99)

## 2022-04-24 LAB — GLUCOSE, CAPILLARY
Glucose-Capillary: 154 mg/dL — ABNORMAL HIGH (ref 70–99)
Glucose-Capillary: 147 mg/dL — ABNORMAL HIGH (ref 70–99)

## 2022-04-24 LAB — CBC
HCT: 38.1 % (ref 36.0–46.0)
Hemoglobin: 11.1 g/dL — ABNORMAL LOW (ref 12.0–15.0)
MCH: 29.2 pg (ref 26.0–34.0)
MCHC: 29.1 g/dL — ABNORMAL LOW (ref 30.0–36.0)
MCV: 100.3 fL — ABNORMAL HIGH (ref 80.0–100.0)
Platelets: 143 10*3/uL — ABNORMAL LOW (ref 150–400)
RBC: 3.8 MIL/uL — ABNORMAL LOW (ref 3.87–5.11)
RDW: 18.5 % — ABNORMAL HIGH (ref 11.5–15.5)
WBC: 15.6 10*3/uL — ABNORMAL HIGH (ref 4.0–10.5)
nRBC: 0.3 % — ABNORMAL HIGH (ref 0.0–0.2)

## 2022-04-24 LAB — CORTISOL-AM, BLOOD: Cortisol - AM: 53.1 ug/dL — ABNORMAL HIGH (ref 6.7–22.6)

## 2022-04-24 MED ORDER — FUROSEMIDE 10 MG/ML IJ SOLN
40.0000 mg | Freq: Two times a day (BID) | INTRAMUSCULAR | Status: DC
Start: 2022-04-24 — End: 2022-04-25
  Administered 2022-04-24 – 2022-04-25 (×2): 40 mg via INTRAVENOUS
  Filled 2022-04-24 (×2): qty 4

## 2022-04-24 MED ORDER — FUROSEMIDE 10 MG/ML IJ SOLN
40.0000 mg | INTRAMUSCULAR | Status: AC
  Administered 2022-04-24: 40 mg via INTRAVENOUS
  Filled 2022-04-24: qty 4

## 2022-04-24 MED ORDER — HYDRALAZINE HCL 20 MG/ML IJ SOLN
10.0000 mg | Freq: Four times a day (QID) | INTRAMUSCULAR | Status: AC | PRN
  Administered 2022-04-24 – 2022-04-25 (×2): 10 mg via INTRAVENOUS
  Filled 2022-04-24: qty 1

## 2022-04-24 NOTE — ED Notes (Signed)
Pt had received over 2 liters of IV fluid but had put out minimal urine. A bladder scan was performed and found to have 48m in bladder. An order was obtained to straight cath pt from provider. Beth, tech was second person to assist this nurse. 4231mof urine was removed from bladder. Pt's purewick replaced and new depends put in place. Pt following commands, but sleeping. Pt adjusted in bed for comfort

## 2022-04-24 NOTE — Progress Notes (Signed)
Pt taken off bipap and placed on 2pm [place], sats 98, respiratory rate 18/min, no respiratory distress noted, pt alert to self and follows simple commands. RN notified, will continue to monitor respiratory status.

## 2022-04-24 NOTE — ED Notes (Addendum)
Pt's daughter wants to please be notified when her mother is moved to a room or if there are any changes. Daughter's name is Thomasene Lot [phone]

## 2022-04-24 NOTE — Progress Notes (Signed)
Found requisition slips on printer @ 1000 that an urgent ABG was to be drawn and a consult to respiratory care had been placed @ 0911. This writer was not notified when order was placed.

## 2022-04-24 NOTE — Progress Notes (Signed)
PROGRESS NOTE    Sierra [name]  VZD:[id number] DOB: 05-21-1936 DOA: 04/23/2022 PCP: Venia Carbon, MD    Assessment & Plan:   Principal Problem:   Acute hypoxemic respiratory failure ([place]) Active Problems:   Sepsis ([place])   TIA (transient ischemic attack)   Hypothyroidism   HTN (hypertension)   Diabetes ([place])   Protein C deficiency ([place])   Protein S deficiency ([place])   Deep vein thrombosis (DVT) of right lower extremity ([name])   HLD (hyperlipidemia)   Ovarian cancer ([place])   AKI (acute kidney injury) ([place])  Assessment and Plan: Acute hypoxic & hypercapnic respiratory failure: etiology unclear. CXR shows mild vascular congestion. Continue on bronchodilators & IV lasix. Put on BiPAP for CO2 retention. NPO while on BiPAP. High risk for intubation & cardiac arrest.   Sepsis: met criteria w/ tachycardia, tachypnea, possible UTI.  Blood cxs & urine cx are pending.   Possible UTI: UA was positive. Urine cx is pending. Continue on IV rocephin   AKI on CKDIIIb: Cr is labile.   Ovarian cancer: chemo was d/c as it has been ineffective w/ evidence of continued spread. Management per onco outpatient    HLD: continue on statin    DVT of RLE: continue on eliquis   Hypothyroidism: continue on levothyroxine   Thrombocytopenia: etiology unclear. Will continue to monitor        DVT prophylaxis: eliquis Code Status: DNR Family Communication: discussed pt's care w/ pt's daughter, [name], and answered her questions  Disposition Plan:  unclear  Level of care: Telemetry Cardiac  Status is: Inpatient Remains inpatient appropriate because: severity of illness   Consultants:    Procedures  Antimicrobials: rocephin, azithromycin    Subjective: Pt is significantly lethargic   Objective: Vitals:   04/24/22 0500 04/24/22 0530 04/24/22 0600 04/24/22 0630  BP: (!) 157/77 (!) 145/64 (!) 147/68 (!) 143/69  Pulse: 64 62 68 66  Resp: _0 Temp:      TempSrc:      SpO2:  93% 97% 95% 99%  Weight:      Height:        Intake/Output Summary (Last 24 hours) at 04/24/2022 0819 Last data filed at 04/24/2022 0651 Gross per 24 hour  Intake 2287.86 ml  Output 420 ml  Net 1867.86 ml   Filed Weights   04/23/22 0909  Weight: 88.5 kg    Examination:  General exam: Appears lethargic Respiratory system: course breath sounds b/l  Cardiovascular system: S1 & S2+. No rubs, gallops or clicks.  Gastrointestinal system: Abdomen is nondistended, soft and nontender. Hypoactive bowel sounds heard. Central nervous system: Significantly lethargic.  Psychiatry: Judgement and insight appears poor     Data Reviewed: I have personally reviewed following labs and imaging studies  CBC: Recent Labs  Lab 04/23/22 0957 04/24/22 0403  WBC 9.9 15.6*  NEUTROABS 8.6*  --   HGB 9.7* 11.1*  HCT 32.2* 38.1  MCV 97.9 100.3*  PLT 128* 295*   Basic Metabolic Panel: Recent Labs  Lab 04/23/22 0957 04/24/22 0403  NA 142 144  K 3.6 4.0  CL 110 111  CO2 26 25  GLUCOSE 133* 222*  BUN 39* 33*  CREATININE 1.87* 1.71*  CALCIUM 8.0* 8.4*   GFR: Estimated Creatinine Clearance: 28.5 mL/min (A) (by C-G formula based on SCr of 1.71 mg/dL (H)). Liver Function Tests: Recent Labs  Lab 04/23/22 0957  AST 16  ALT 13  ALKPHOS 47  BILITOT 0.8  PROT 5.6*  ALBUMIN 3.2*   No results for input(s): "LIPASE", "AMYLASE" in the last 168 hours. No results for input(s): "AMMONIA" in the last 168 hours. Coagulation Profile: Recent Labs  Lab 04/23/22 0957 04/24/22 0403  INR 1.2 1.3*   Cardiac Enzymes: No results for input(s): "CKTOTAL", "CKMB", "CKMBINDEX", "TROPONINI" in the last 168 hours. BNP (last 3 results) No results for input(s): "PROBNP" in the last 8760 hours. HbA1C: No results for input(s): "HGBA1C" in the last 72 hours. CBG: Recent Labs  Lab 04/23/22 1748 04/23/22 2205  GLUCAP 124* 123*   Lipid Profile: No results for input(s): "CHOL", "HDL", "LDLCALC", "TRIG",  "CHOLHDL", "LDLDIRECT" in the last 72 hours. Thyroid Function Tests: No results for input(s): "TSH", "T4TOTAL", "FREET4", "T3FREE", "THYROIDAB" in the last 72 hours. Anemia Panel: No results for input(s): "VITAMINB12", "FOLATE", "FERRITIN", "TIBC", "IRON", "RETICCTPCT" in the last 72 hours. Sepsis Labs: Recent Labs  Lab 04/23/22 0957  PROCALCITON 1.84  LATICACIDVEN 1.2    Recent Results (from the past 240 hour(s))  Blood Culture (routine x 2)     Status: None (Preliminary result)   Collection Time: 04/23/22  7:01 AM   Specimen: BLOOD  Result Value Ref Range Status   Specimen Description BLOOD RIGHT ANTECUBITAL  Final   Special Requests   Final    BOTTLES DRAWN AEROBIC AND ANAEROBIC Blood Culture adequate volume   Culture   Final    NO GROWTH < 24 HOURS Performed at [facility], [address], [place], [place] [address]    Report Status PENDING  Incomplete  Resp Panel by RT-PCR (Flu A&B, Covid) Anterior Nasal Swab     Status: None   Collection Time: 04/23/22  9:57 AM   Specimen: Anterior Nasal Swab  Result Value Ref Range Status   SARS Coronavirus 2 by RT PCR NEGATIVE NEGATIVE Final    Comment: (NOTE) SARS-CoV-2 target nucleic acids are NOT DETECTED.  The SARS-CoV-2 RNA is generally detectable in upper respiratory specimens during the acute phase of infection. The lowest concentration of SARS-CoV-2 viral copies this assay can detect is 138 copies/mL. A negative result does not preclude SARS-Cov-2 infection and should not be used as the sole basis for treatment or other patient management decisions. A negative result may occur with  improper specimen collection/handling, submission of specimen other than nasopharyngeal swab, presence of viral mutation(s) within the areas targeted by this assay, and inadequate number of viral copies(<138 copies/mL). A negative result must be combined with clinical observations, patient history, and epidemiological information.  The expected result is Negative.  Fact Sheet for Patients:  EntrepreneurPulse.com.au  Fact Sheet for Healthcare Providers:  IncredibleEmployment.be  This test is no t yet approved or cleared by the [place] FDA and  has been authorized for detection and/or diagnosis of SARS-CoV-2 by FDA under an Emergency Use Authorization (EUA). This EUA will remain  in effect (meaning this test can be used) for the duration of the COVID-19 declaration under Section 564(b)(1) of the Act, 21 U.S.C.section 360bbb-3(b)(1), unless the authorization is terminated  or revoked sooner.       Influenza A by PCR NEGATIVE NEGATIVE Final   Influenza B by PCR NEGATIVE NEGATIVE Final    Comment: (NOTE) The Xpert Xpress SARS-CoV-2/FLU/RSV plus assay is intended as an aid in the diagnosis of influenza from Nasopharyngeal swab specimens and should not be used as a sole basis for treatment. Nasal washings and aspirates are unacceptable for Xpert Xpress SARS-CoV-2/FLU/RSV testing.  Fact Sheet for Patients: EntrepreneurPulse.com.au  Fact  Sheet for Healthcare Providers: IncredibleEmployment.be  This test is not yet approved or cleared by the [place] and has been authorized for detection and/or diagnosis of SARS-CoV-2 by FDA under an Emergency Use Authorization (EUA). This EUA will remain in effect (meaning this test can be used) for the duration of the COVID-19 declaration under Section 564(b)(1) of the Act, 21 U.S.C. section 360bbb-3(b)(1), unless the authorization is terminated or revoked.  Performed at [facility], [address] [place], [place] [address]   Blood Culture (routine x 2)     Status: None (Preliminary result)   Collection Time: 04/23/22 10:00 AM   Specimen: BLOOD  Result Value Ref Range Status   Specimen Description BLOOD BLOOD RIGHT WRIST  Final   Special Requests   Final    BOTTLES DRAWN  AEROBIC AND ANAEROBIC Blood Culture adequate volume   Culture   Final    NO GROWTH < 24 HOURS Performed at [facility], [address] [place], [place] [address]    Report Status PENDING  Incomplete         Radiology Studies: NM Pulmonary Perfusion  Result Date: 04/23/2022 CLINICAL DATA:  Pulmonary embolism suspected, high probability. Leg swelling and hypoxemia. EXAM: NUCLEAR MEDICINE PERFUSION LUNG SCAN TECHNIQUE: Perfusion images were obtained in multiple projections after intravenous injection of radiopharmaceutical. Ventilation scans intentionally deferred if perfusion scan and chest x-ray adequate for interpretation during COVID 19 epidemic. RADIOPHARMACEUTICALS:  4.08 mCi Tc-58mMAA IV COMPARISON:  Radiographs 04/23/2022 and 07/10/2020. FINDINGS: There are no wedge-shaped perfusion defects suspicious for pulmonary embolism. Central right lung defect on the RPO view attributed to the right hilum. IMPRESSION: Pulmonary embolism absent per PISAPED criteria. Electronically Signed   By: WRichardean SaleM.D.   On: 04/23/2022 14:46   UKoreaVenous Img Lower Bilateral  Result Date: 04/23/2022 CLINICAL DATA:  Pain and swelling EXAM: BILATERAL LOWER EXTREMITY VENOUS DOPPLER ULTRASOUND TECHNIQUE: Gray-scale sonography with graded compression, as well as color Doppler and duplex ultrasound were performed to evaluate the lower extremity deep venous systems from the level of the common femoral vein and including the common femoral, femoral, profunda femoral, popliteal and calf veins including the posterior tibial, peroneal and gastrocnemius veins when visible. The superficial great saphenous vein was also interrogated. Spectral Doppler was utilized to evaluate flow at rest and with distal augmentation maneuvers in the common femoral, femoral and popliteal veins. COMPARISON:  None Available. FINDINGS: RIGHT LOWER EXTREMITY Common Femoral Vein: No evidence of thrombus. Normal compressibility,  respiratory phasicity and response to augmentation. Saphenofemoral Junction: No evidence of thrombus. Normal compressibility and flow on color Doppler imaging. Profunda Femoral Vein: No evidence of thrombus. Normal compressibility and flow on color Doppler imaging. Femoral Vein: No evidence of thrombus. Normal compressibility, respiratory phasicity and response to augmentation. Popliteal Vein: No evidence of thrombus. Normal compressibility, respiratory phasicity and response to augmentation. Calf Veins: No evidence of thrombus. Normal compressibility and flow on color Doppler imaging. Superficial Great Saphenous Vein: No evidence of thrombus. Normal compressibility. Venous Reflux:  None. Other Findings:  None. LEFT LOWER EXTREMITY Common Femoral Vein: No evidence of thrombus. Normal compressibility, respiratory phasicity and response to augmentation. Saphenofemoral Junction: No evidence of thrombus. Normal compressibility and flow on color Doppler imaging. Profunda Femoral Vein: No evidence of thrombus. Normal compressibility and flow on color Doppler imaging. Femoral Vein: No evidence of thrombus. Normal compressibility, respiratory phasicity and response to augmentation. Popliteal Vein: No evidence of thrombus. Normal compressibility, respiratory phasicity and response to  augmentation. Calf Veins: No evidence of thrombus. Normal compressibility and flow on color Doppler imaging. Superficial Great Saphenous Vein: No evidence of thrombus. Normal compressibility. Venous Reflux:  None. Other Findings: There is edema in the subcutaneous plane in the calves, more so on the left side without any loculated fluid collections. IMPRESSION: No evidence of deep venous thrombosis in either lower extremity. Electronically Signed   By: Elmer Picker M.D.   On: 04/23/2022 13:23   DG Chest Port 1 View  Result Date: 04/23/2022 CLINICAL DATA:  Ulnar mental status.  Hypoxemia. EXAM: PORTABLE CHEST 1 VIEW COMPARISON:   Radiographs 07/10/2020 and 09/18/2019. FINDINGS: 1003 hours. Mild patient rotation to the right and lower lung volumes. Right IJ Port-A-Cath extends to the level of the superior cavoatrial junction. Interval mild enlargement of the heart which appears mildly enlarged. There is vascular congestion without overt pulmonary edema, confluent airspace opacity, pneumothorax or significant pleural effusion. No acute osseous findings are evident. Telemetry leads overlie the chest. IMPRESSION: Cardiomegaly with mild vascular congestion. No overt edema or focal airspace disease demonstrated. Electronically Signed   By: Richardean Sale M.D.   On: 04/23/2022 10:08        Scheduled Meds:  apixaban  2.5 mg Oral BID   atorvastatin  40 mg Oral QHS   DULoxetine  30 mg Oral Daily   famotidine  10 mg Oral QHS   insulin aspart  0-5 Units Subcutaneous QHS   insulin aspart  0-9 Units Subcutaneous TID WC   levothyroxine  75 mcg Oral QAC breakfast   melatonin  2.5 mg Oral QHS   mesalamine  2.4 g Oral Q breakfast   thiamine  100 mg Oral Daily   vitamin B-12  1,000 mcg Oral Daily   Continuous Infusions:  sodium chloride 150 mL/hr at 04/24/22 0651   azithromycin Stopped (04/23/22 1143)   cefTRIAXone (ROCEPHIN)  IV Stopped (04/23/22 1034)     LOS: 1 day    Time spent: 35 mins     Wyvonnia Dusky, MD [facility] Pager [phone]  If 7PM-7AM, please contact night-coverage 04/24/2022, 8:19 AM

## 2022-04-24 NOTE — ED Notes (Signed)
Pt placed on inpatient bed, linens changed. Pt responding to commands, but is sleeping. Labs acquired.Pt given hydralazine Prn due to BP.

## 2022-04-24 NOTE — ED Notes (Signed)
Report to Matthew, RN

## 2022-04-24 NOTE — Progress Notes (Addendum)
Notified B.Randol Kern, NP of bp remaining elevated at 185/92 even after given Hydralazine on day shift at 1629. VO okay to give 10 mg hydralazine now. See new order.

## 2022-04-25 DIAGNOSIS — N179 Acute kidney failure, unspecified: Secondary | ICD-10-CM | POA: Diagnosis not present

## 2022-04-25 DIAGNOSIS — A419 Sepsis, unspecified organism: Secondary | ICD-10-CM | POA: Diagnosis not present

## 2022-04-25 DIAGNOSIS — J9601 Acute respiratory failure with hypoxia: Secondary | ICD-10-CM | POA: Diagnosis not present

## 2022-04-25 DIAGNOSIS — J9602 Acute respiratory failure with hypercapnia: Secondary | ICD-10-CM | POA: Diagnosis not present

## 2022-04-25 LAB — BLOOD GAS, ARTERIAL
Acid-Base Excess: 12.4 mmol/L — ABNORMAL HIGH (ref 0.0–2.0)
Bicarbonate: 36.7 mmol/L — ABNORMAL HIGH (ref 20.0–28.0)
FIO2: 28 %
O2 Content: 2 L/min
O2 Saturation: 97.1 %
Patient temperature: 37
pCO2 arterial: 45 mmHg (ref 32–48)
pH, Arterial: 7.52 — ABNORMAL HIGH (ref 7.35–7.45)
pO2, Arterial: 83 mmHg (ref 83–108)

## 2022-04-25 LAB — URINE CULTURE

## 2022-04-25 LAB — BASIC METABOLIC PANEL
Anion gap: 9 (ref 5–15)
BUN: 35 mg/dL — ABNORMAL HIGH (ref 8–23)
CO2: 31 mmol/L (ref 22–32)
Calcium: 9.2 mg/dL (ref 8.9–10.3)
Chloride: 106 mmol/L (ref 98–111)
Creatinine, Ser: 1.55 mg/dL — ABNORMAL HIGH (ref 0.44–1.00)
GFR, Estimated: 33 mL/min — ABNORMAL LOW (ref >60)
Glucose, Bld: 175 mg/dL — ABNORMAL HIGH (ref 70–99)
Potassium: 3.3 mmol/L — ABNORMAL LOW (ref 3.5–5.1)
Sodium: 146 mmol/L — ABNORMAL HIGH (ref 135–145)

## 2022-04-25 LAB — CBC
HCT: 32.6 % — ABNORMAL LOW (ref 36.0–46.0)
Hemoglobin: 10.2 g/dL — ABNORMAL LOW (ref 12.0–15.0)
MCH: 29.7 pg (ref 26.0–34.0)
MCHC: 31.3 g/dL (ref 30.0–36.0)
MCV: 94.8 fL (ref 80.0–100.0)
Platelets: 100 10*3/uL — ABNORMAL LOW (ref 150–400)
RBC: 3.44 MIL/uL — ABNORMAL LOW (ref 3.87–5.11)
RDW: 18.3 % — ABNORMAL HIGH (ref 11.5–15.5)
WBC: 12.8 10*3/uL — ABNORMAL HIGH (ref 4.0–10.5)
nRBC: 0 % (ref 0.0–0.2)

## 2022-04-25 LAB — GLUCOSE, CAPILLARY
Glucose-Capillary: 163 mg/dL — ABNORMAL HIGH (ref 70–99)
Glucose-Capillary: 163 mg/dL — ABNORMAL HIGH (ref 70–99)
Glucose-Capillary: 171 mg/dL — ABNORMAL HIGH (ref 70–99)
Glucose-Capillary: 145 mg/dL — ABNORMAL HIGH (ref 70–99)

## 2022-04-25 MED ORDER — POTASSIUM CHLORIDE 10 MEQ/100ML IV SOLN
10.0000 meq | INTRAVENOUS | Status: AC
  Administered 2022-04-25 (×2): 10 meq via INTRAVENOUS
  Filled 2022-04-25 (×2): qty 100

## 2022-04-25 MED ORDER — FUROSEMIDE 10 MG/ML IJ SOLN
40.0000 mg | Freq: Every day | INTRAMUSCULAR | Status: DC
Start: 2022-04-26 — End: 2022-04-27
  Administered 2022-04-26: 40 mg via INTRAVENOUS
  Filled 2022-04-25: qty 4

## 2022-04-25 MED ORDER — ORAL CARE MOUTH RINSE
15.0000 mL | OROMUCOSAL | Status: DC
  Administered 2022-04-25 – 2022-04-28 (×8): 15 mL via OROMUCOSAL

## 2022-04-25 MED ORDER — CHLORHEXIDINE GLUCONATE CLOTH 2 % EX PADS
6.0000 | MEDICATED_PAD | Freq: Every day | CUTANEOUS | Status: DC
Start: 2022-04-25 — End: 2022-04-28
  Administered 2022-04-25 – 2022-04-28 (×3): 6 via TOPICAL

## 2022-04-25 MED ORDER — ORAL CARE MOUTH RINSE: 15.0000 mL | OROMUCOSAL | Status: DC | PRN

## 2022-04-25 NOTE — Progress Notes (Signed)
PROGRESS NOTE    Sierra [name]  WNI:[id number] DOB: 23-May-1936 DOA: 04/23/2022 PCP: Venia Carbon, MD    Assessment & Plan:   Principal Problem:   Acute hypoxemic respiratory failure ([place]) Active Problems:   Sepsis ([place])   TIA (transient ischemic attack)   Hypothyroidism   HTN (hypertension)   Diabetes ([place])   Protein C deficiency ([place])   Protein S deficiency ([place])   Deep vein thrombosis (DVT) of right lower extremity ([name])   HLD (hyperlipidemia)   Ovarian cancer ([place])   AKI (acute kidney injury) ([place])   Respiratory failure ([name])  Assessment and Plan: Acute hypoxic & hypercapnic respiratory failure: etiology unclear. CXR shows mild vascular congestion. Continue on bronchodilators. Weaned off BiPAP but BiPAP qhs & prn. Continue on supplemental oxygen and wean as tolerated   Sepsis: met criteria w/ tachycardia, tachypnea, possible UTI.  Blood cxs NGTD. Urine cx shows containment    Possible UTI: UA was positive. Urine cx shows containment. Unlikely repeat urine cx will grow anything as pt has been on abxs. Continue on IV rocephin.   Hypernatremia: free water deficit is 0.6L. Encourage free water intake   Acute metabolic encephalopathy: secondary to above. Slight improvement from day prior but still very confused.   AKI on CKDIIIb: Cr is trending down daily. Avoid nephrotoxic meds   Ovarian cancer: chemo was d/c as it has been ineffective w/ evidence of continued spread. Management per onco outpatient   Likely anemia of chronic disease: likely secondary to ovarian cancer & recent chemo. No need for a transfusion currently    HLD: continue on statin    DVT of RLE: continue on eliquis   Hypothyroidism: continue on levothyroxine    Thrombocytopenia: etiology unclear, labile. Will continue to monitor        DVT prophylaxis: eliquis Code Status: DNR Family Communication: called pt's daughter, [name], no answer so I left a voicemail  Disposition Plan:   unclear  Level of care: Progressive  Status is: Inpatient Remains inpatient appropriate because: severity of illness   Consultants:    Procedures  Antimicrobials: rocephin, azithromycin    Subjective: Pt is confused   Objective: Vitals:   04/25/22 0300 04/25/22 0343 04/25/22 0400 04/25/22 0500  BP:  (!) 182/91 (!) 184/89 (!) 154/70  Pulse:  64 61 65  Resp:  13 13 13   Temp: 97.9 F (36.6 C)     TempSrc: Axillary     SpO2:  100% 99% 100%  Weight:      Height:        Intake/Output Summary (Last 24 hours) at 04/25/2022 0805 Last data filed at 04/25/2022 0300 Gross per 24 hour  Intake 782 ml  Output 2800 ml  Net -2018 ml   Filed Weights   04/23/22 0909  Weight: 88.5 kg    Examination:  General exam: Appears calm  Respiratory system: diminished breath sounds b/l  Cardiovascular system: S1/S2+. No rubs or clicks  Gastrointestinal system: Abd is soft, NT, obese & hypoactive bowel sounds Central nervous system: improved lethargy. Moves all extremities  Psychiatry: Judgement and insight appears poor. Flat mood and affect     Data Reviewed: I have personally reviewed following labs and imaging studies  CBC: Recent Labs  Lab 04/23/22 0957 04/24/22 0403 04/25/22 0336  WBC 9.9 15.6* 12.8*  NEUTROABS 8.6*  --   --   HGB 9.7* 11.1* 10.2*  HCT 32.2* 38.1 32.6*  MCV 97.9 100.3* 94.8  PLT 128* 143* 100*   Basic  Metabolic Panel: Recent Labs  Lab 04/23/22 0957 04/24/22 0403 04/25/22 0336  NA 142 144 146*  K 3.6 4.0 3.3*  CL 110 111 106  CO2 26 25 31   GLUCOSE 133* 222* 175*  BUN 39* 33* 35*  CREATININE 1.87* 1.71* 1.55*  CALCIUM 8.0* 8.4* 9.2   GFR: Estimated Creatinine Clearance: 31.4 mL/min (A) (by C-G formula based on SCr of 1.55 mg/dL (H)). Liver Function Tests: Recent Labs  Lab 04/23/22 0957  AST 16  ALT 13  ALKPHOS 47  BILITOT 0.8  PROT 5.6*  ALBUMIN 3.2*   No results for input(s): "LIPASE", "AMYLASE" in the last 168 hours. No results  for input(s): "AMMONIA" in the last 168 hours. Coagulation Profile: Recent Labs  Lab 04/23/22 0957 04/24/22 0403  INR 1.2 1.3*   Cardiac Enzymes: No results for input(s): "CKTOTAL", "CKMB", "CKMBINDEX", "TROPONINI" in the last 168 hours. BNP (last 3 results) No results for input(s): "PROBNP" in the last 8760 hours. HbA1C: No results for input(s): "HGBA1C" in the last 72 hours. CBG: Recent Labs  Lab 04/23/22 2205 04/24/22 0839 04/24/22 1627 04/24/22 2018 04/25/22 0750  GLUCAP 123* 197* 154* 147* 163*   Lipid Profile: No results for input(s): "CHOL", "HDL", "LDLCALC", "TRIG", "CHOLHDL", "LDLDIRECT" in the last 72 hours. Thyroid Function Tests: No results for input(s): "TSH", "T4TOTAL", "FREET4", "T3FREE", "THYROIDAB" in the last 72 hours. Anemia Panel: No results for input(s): "VITAMINB12", "FOLATE", "FERRITIN", "TIBC", "IRON", "RETICCTPCT" in the last 72 hours. Sepsis Labs: Recent Labs  Lab 04/23/22 0957 04/24/22 0918  PROCALCITON 1.84  --   LATICACIDVEN 1.2 2.3*    Recent Results (from the past 240 hour(s))  Urine Culture     Status: Abnormal   Collection Time: 04/23/22  1:45 AM   Specimen: In/Out Cath Urine  Result Value Ref Range Status   Specimen Description   Final    IN/OUT CATH URINE Performed at [facility], [address], [place], [place] [address]    Special Requests   Final    NONE Performed at [facility], [address] [place], [place] [address]    Culture MULTIPLE SPECIES PRESENT, SUGGEST RECOLLECTION (A)  Final   Report Status 04/25/2022 FINAL  Final  Blood Culture (routine x 2)     Status: None (Preliminary result)   Collection Time: 04/23/22  7:01 AM   Specimen: BLOOD  Result Value Ref Range Status   Specimen Description BLOOD RIGHT ANTECUBITAL  Final   Special Requests   Final    BOTTLES DRAWN AEROBIC AND ANAEROBIC Blood Culture adequate volume   Culture   Final    NO GROWTH 2 DAYS Performed at [facility], [address], [place], [place] [address]    Report Status PENDING  Incomplete  Resp Panel by RT-PCR (Flu A&B, Covid) Anterior Nasal Swab     Status: None   Collection Time: 04/23/22  9:57 AM   Specimen: Anterior Nasal Swab  Result Value Ref Range Status   SARS Coronavirus 2 by RT PCR NEGATIVE NEGATIVE Final    Comment: (NOTE) SARS-CoV-2 target nucleic acids are NOT DETECTED.  The SARS-CoV-2 RNA is generally detectable in upper respiratory specimens during the acute phase of infection. The lowest concentration of SARS-CoV-2 viral copies this assay can detect is 138 copies/mL. A negative result does not preclude SARS-Cov-2 infection and should not be used as the sole basis for treatment or other patient management decisions. A negative result may occur with  improper specimen collection/handling,  submission of specimen other than nasopharyngeal swab, presence of viral mutation(s) within the areas targeted by this assay, and inadequate number of viral copies(<138 copies/mL). A negative result must be combined with clinical observations, patient history, and epidemiological information. The expected result is Negative.  Fact Sheet for Patients:  EntrepreneurPulse.com.au  Fact Sheet for Healthcare Providers:  IncredibleEmployment.be  This test is no t yet approved or cleared by the [place] FDA and  has been authorized for detection and/or diagnosis of SARS-CoV-2 by FDA under an Emergency Use Authorization (EUA). This EUA will remain  in effect (meaning this test can be used) for the duration of the COVID-19 declaration under Section 564(b)(1) of the Act, 21 U.S.C.section 360bbb-3(b)(1), unless the authorization is terminated  or revoked sooner.       Influenza A by PCR NEGATIVE NEGATIVE Final   Influenza B by PCR NEGATIVE NEGATIVE Final    Comment: (NOTE) The Xpert Xpress SARS-CoV-2/FLU/RSV plus assay is intended as an aid in the  diagnosis of influenza from Nasopharyngeal swab specimens and should not be used as a sole basis for treatment. Nasal washings and aspirates are unacceptable for Xpert Xpress SARS-CoV-2/FLU/RSV testing.  Fact Sheet for Patients: EntrepreneurPulse.com.au  Fact Sheet for Healthcare Providers: IncredibleEmployment.be  This test is not yet approved or cleared by the [place] FDA and has been authorized for detection and/or diagnosis of SARS-CoV-2 by FDA under an Emergency Use Authorization (EUA). This EUA will remain in effect (meaning this test can be used) for the duration of the COVID-19 declaration under Section 564(b)(1) of the Act, 21 U.S.C. section 360bbb-3(b)(1), unless the authorization is terminated or revoked.  Performed at [facility], [address] [place], [place] [address]   Blood Culture (routine x 2)     Status: None (Preliminary result)   Collection Time: 04/23/22 10:00 AM   Specimen: BLOOD  Result Value Ref Range Status   Specimen Description BLOOD BLOOD RIGHT WRIST  Final   Special Requests   Final    BOTTLES DRAWN AEROBIC AND ANAEROBIC Blood Culture adequate volume   Culture   Final    NO GROWTH 2 DAYS Performed at [facility], [address] [place], [place] [address]    Report Status PENDING  Incomplete         Radiology Studies: CT HEAD WO CONTRAST (5MM)  Result Date: 04/24/2022 CLINICAL DATA:  86 year old female with history of altered mental status. EXAM: CT HEAD WITHOUT CONTRAST TECHNIQUE: Contiguous axial images were obtained from the base of the skull through the vertex without intravenous contrast. RADIATION DOSE REDUCTION: This exam was performed according to the departmental dose-optimization program which includes automated exposure control, adjustment of the mA and/or kV according to patient size and/or use of iterative reconstruction technique. COMPARISON:  Head CT 01/18/2022.  FINDINGS: Brain: Mild cerebral and cerebellar atrophy. Patchy and confluent areas of decreased attenuation are noted throughout the deep and periventricular white matter of the cerebral hemispheres bilaterally, compatible with chronic microvascular ischemic disease. No evidence of acute infarction, hemorrhage, hydrocephalus, extra-axial collection or mass lesion/mass effect. Vascular: No hyperdense vessel or unexpected calcification. Skull: Normal. Negative for fracture or focal lesion. Sinuses/Orbits: No acute finding. Other: None. IMPRESSION: 1. No acute intracranial abnormalities. 2. Mild cerebral and cerebellar atrophy with chronic microvascular ischemic changes in the cerebral white matter, as above. Electronically Signed   By: Vinnie Langton M.D.   On: 04/24/2022 10:40   NM Pulmonary Perfusion  Result Date: 04/23/2022 CLINICAL DATA:  Pulmonary embolism suspected, high probability. Leg swelling and hypoxemia. EXAM: NUCLEAR MEDICINE PERFUSION LUNG SCAN TECHNIQUE: Perfusion images were obtained in multiple projections after intravenous injection of radiopharmaceutical. Ventilation scans intentionally deferred if perfusion scan and chest x-ray adequate for interpretation during COVID 19 epidemic. RADIOPHARMACEUTICALS:  4.08 mCi Tc-11mMAA IV COMPARISON:  Radiographs 04/23/2022 and 07/10/2020. FINDINGS: There are no wedge-shaped perfusion defects suspicious for pulmonary embolism. Central right lung defect on the RPO view attributed to the right hilum. IMPRESSION: Pulmonary embolism absent per PISAPED criteria. Electronically Signed   By: WRichardean SaleM.D.   On: 04/23/2022 14:46   UK[place]Venous Img Lower Bilateral  Result Date: 04/23/2022 CLINICAL DATA:  Pain and swelling EXAM: BILATERAL LOWER EXTREMITY VENOUS DOPPLER ULTRASOUND TECHNIQUE: Gray-scale sonography with graded compression, as well as color Doppler and duplex ultrasound were performed to evaluate the lower extremity deep venous systems from the  level of the common femoral vein and including the common femoral, femoral, profunda femoral, popliteal and calf veins including the posterior tibial, peroneal and gastrocnemius veins when visible. The superficial great saphenous vein was also interrogated. Spectral Doppler was utilized to evaluate flow at rest and with distal augmentation maneuvers in the common femoral, femoral and popliteal veins. COMPARISON:  None Available. FINDINGS: RIGHT LOWER EXTREMITY Common Femoral Vein: No evidence of thrombus. Normal compressibility, respiratory phasicity and response to augmentation. Saphenofemoral Junction: No evidence of thrombus. Normal compressibility and flow on color Doppler imaging. Profunda Femoral Vein: No evidence of thrombus. Normal compressibility and flow on color Doppler imaging. Femoral Vein: No evidence of thrombus. Normal compressibility, respiratory phasicity and response to augmentation. Popliteal Vein: No evidence of thrombus. Normal compressibility, respiratory phasicity and response to augmentation. Calf Veins: No evidence of thrombus. Normal compressibility and flow on color Doppler imaging. Superficial Great Saphenous Vein: No evidence of thrombus. Normal compressibility. Venous Reflux:  None. Other Findings:  None. LEFT LOWER EXTREMITY Common Femoral Vein: No evidence of thrombus. Normal compressibility, respiratory phasicity and response to augmentation. Saphenofemoral Junction: No evidence of thrombus. Normal compressibility and flow on color Doppler imaging. Profunda Femoral Vein: No evidence of thrombus. Normal compressibility and flow on color Doppler imaging. Femoral Vein: No evidence of thrombus. Normal compressibility, respiratory phasicity and response to augmentation. Popliteal Vein: No evidence of thrombus. Normal compressibility, respiratory phasicity and response to augmentation. Calf Veins: No evidence of thrombus. Normal compressibility and flow on color Doppler imaging.  Superficial Great Saphenous Vein: No evidence of thrombus. Normal compressibility. Venous Reflux:  None. Other Findings: There is edema in the subcutaneous plane in the calves, more so on the left side without any loculated fluid collections. IMPRESSION: No evidence of deep venous thrombosis in either lower extremity. Electronically Signed   By: PElmer PickerM.D.   On: 04/23/2022 13:23   DG Chest Port 1 View  Result Date: 04/23/2022 CLINICAL DATA:  Ulnar mental status.  Hypoxemia. EXAM: PORTABLE CHEST 1 VIEW COMPARISON:  Radiographs 07/10/2020 and 09/18/2019. FINDINGS: 1003 hours. Mild patient rotation to the right and lower lung volumes. Right IJ Port-A-Cath extends to the level of the superior cavoatrial junction. Interval mild enlargement of the heart which appears mildly enlarged. There is vascular congestion without overt pulmonary edema, confluent airspace opacity, pneumothorax or significant pleural effusion. No acute osseous findings are evident. Telemetry leads overlie the chest. IMPRESSION: Cardiomegaly with mild vascular congestion. No overt edema or focal airspace disease demonstrated. Electronically Signed   By: WRichardean SaleM.D.   On: 04/23/2022 10:08  Scheduled Meds:  apixaban  2.5 mg Oral BID   atorvastatin  40 mg Oral QHS   DULoxetine  30 mg Oral Daily   famotidine  10 mg Oral QHS   furosemide  40 mg Intravenous BID   insulin aspart  0-5 Units Subcutaneous QHS   insulin aspart  0-9 Units Subcutaneous TID WC   levothyroxine  75 mcg Oral QAC breakfast   melatonin  2.5 mg Oral QHS   mesalamine  2.4 g Oral Q breakfast   thiamine  100 mg Oral Daily   vitamin B-12  1,000 mcg Oral Daily   Continuous Infusions:  azithromycin Stopped (04/24/22 1239)   cefTRIAXone (ROCEPHIN)  IV Stopped (04/24/22 1101)     LOS: 2 days    Time spent: 30 mins     Wyvonnia Dusky, MD [facility] Pager [phone]  If 7PM-7AM, please contact  night-coverage 04/25/2022, 8:05 AM

## 2022-04-26 ENCOUNTER — Inpatient Hospital Stay: Payer: Medicare Other

## 2022-04-26 DIAGNOSIS — D696 Thrombocytopenia, unspecified: Secondary | ICD-10-CM

## 2022-04-26 DIAGNOSIS — G9341 Metabolic encephalopathy: Secondary | ICD-10-CM | POA: Diagnosis not present

## 2022-04-26 DIAGNOSIS — J9601 Acute respiratory failure with hypoxia: Secondary | ICD-10-CM | POA: Diagnosis not present

## 2022-04-26 LAB — CBC
HCT: 36.9 % (ref 36.0–46.0)
Hemoglobin: 11.5 g/dL — ABNORMAL LOW (ref 12.0–15.0)
MCH: 28.7 pg (ref 26.0–34.0)
MCHC: 31.2 g/dL (ref 30.0–36.0)
MCV: 92 fL (ref 80.0–100.0)
Platelets: 117 10*3/uL — ABNORMAL LOW (ref 150–400)
RBC: 4.01 MIL/uL (ref 3.87–5.11)
RDW: 17.9 % — ABNORMAL HIGH (ref 11.5–15.5)
WBC: 10.5 10*3/uL (ref 4.0–10.5)
nRBC: 0 % (ref 0.0–0.2)

## 2022-04-26 LAB — GLUCOSE, CAPILLARY
Glucose-Capillary: 182 mg/dL — ABNORMAL HIGH (ref 70–99)
Glucose-Capillary: 172 mg/dL — ABNORMAL HIGH (ref 70–99)
Glucose-Capillary: 173 mg/dL — ABNORMAL HIGH (ref 70–99)
Glucose-Capillary: 154 mg/dL — ABNORMAL HIGH (ref 70–99)

## 2022-04-26 LAB — BASIC METABOLIC PANEL
Anion gap: 9 (ref 5–15)
BUN: 27 mg/dL — ABNORMAL HIGH (ref 8–23)
CO2: 36 mmol/L — ABNORMAL HIGH (ref 22–32)
Calcium: 9.5 mg/dL (ref 8.9–10.3)
Chloride: 99 mmol/L (ref 98–111)
Creatinine, Ser: 1.17 mg/dL — ABNORMAL HIGH (ref 0.44–1.00)
GFR, Estimated: 46 mL/min — ABNORMAL LOW (ref >60)
Glucose, Bld: 192 mg/dL — ABNORMAL HIGH (ref 70–99)
Potassium: 2.9 mmol/L — ABNORMAL LOW (ref 3.5–5.1)
Sodium: 144 mmol/L (ref 135–145)

## 2022-04-26 MED ORDER — HYDRALAZINE HCL 20 MG/ML IJ SOLN
20.0000 mg | Freq: Four times a day (QID) | INTRAMUSCULAR | Status: DC | PRN
  Administered 2022-04-28: 20 mg via INTRAVENOUS
  Filled 2022-04-26: qty 1

## 2022-04-26 MED ORDER — POTASSIUM CHLORIDE CRYS ER 20 MEQ PO TBCR
40.0000 meq | EXTENDED_RELEASE_TABLET | Freq: Two times a day (BID) | ORAL | Status: AC
Start: 2022-04-26 — End: 2022-04-26
  Administered 2022-04-26 (×2): 40 meq via ORAL
  Filled 2022-04-26 (×2): qty 2

## 2022-04-26 MED ORDER — LISINOPRIL 10 MG PO TABS
10.0000 mg | ORAL_TABLET | Freq: Every day | ORAL | Status: DC
  Administered 2022-04-26 – 2022-04-28 (×3): 10 mg via ORAL
  Filled 2022-04-26 (×3): qty 1

## 2022-04-26 NOTE — TOC Initial Note (Addendum)
Transition of Care [facility]) - Initial/Assessment Note    Patient Details  Name: Mercadez Heitman MRN: [id number] Date of Birth: 14-Apr-1936  Transition of Care [facility] CM/SW Contact:    Alberteen Sam, LCSW Phone Number: 04/26/2022, 11:26 AM  Clinical Narrative:                  Update: Per [facility] facility said if needed patient  could go back to [facility] on O2 nasal cannula as needed at night, and then be set up with a sleep study outpatient for them to get her cpap at facility.    CSW spoke with patient's daughter [name] who reports patient is long term at SNF at [facility] which [facility] confirms.   Daughter [name] expressed concerns with patient's O2 needs and recognizes need for either bipap, cpap or night O2 when she goes back.  CSW explain certain stipulations with what [facility] allow, CSW has followed up with Seth Bake at [facility]  to address patient's needs for discharge planning. Pending response on what they allow.   Current plan at discharge is for patient to return to [facility] term .    Expected Discharge Plan: Skilled Nursing Facility Barriers to Discharge: Continued Medical Work up   Patient Goals and CMS Choice Patient states their goals for this hospitalization and ongoing recovery are:: to go home CMS Medicare.gov Compare Post Acute Care list provided to:: Patient Represenative (must comment) (daughter) Choice offered to / list presented to : Patient  Expected Discharge Plan and Services Expected Discharge Plan: [place] arrangements for the past 2 months: [place]                                      Prior Living Arrangements/Services Living arrangements for the past 2 months: [place]                     Activities of Daily Living      Permission Sought/Granted                  Emotional Assessment              Admission diagnosis:  Respiratory failure  ([place]) [J96.90] Acute respiratory failure with hypoxia ([place]) [J96.01] Acute hypoxemic respiratory failure ([place]) [J96.01] Patient Active Problem List   Diagnosis Date Noted   Respiratory failure ([place]) 04/24/2022   Acute hypoxemic respiratory failure ([place]) 04/23/2022   AKI (acute kidney injury) ([place]) 04/23/2022   TIA (transient ischemic attack) 01/17/2022   Diabetes mellitus without complication ([place]) 27/78/2423   HLD (hyperlipidemia) 01/17/2022   Chronic kidney disease, stage 3a ([place]) 01/17/2022   Ovarian cancer ([place]) 01/17/2022   Chronic diastolic CHF (congestive heart failure) ([place]) 01/17/2022   Chronic venous insufficiency 02/09/2020   Lymphedema 02/09/2020   Cardiac pacemaker 10/01/2019   Syncope 09/21/2019   Diabetes mellitus, controlled ([place]) 09/21/2019   Complete heart block ([place]) 09/18/2019   Sepsis ([place]) 08/08/2019   UTI (urinary tract infection) 08/08/2019   Hypothyroidism 08/08/2019   HTN (hypertension) 08/08/2019   Diabetes ([place]) 08/08/2019   Protein C deficiency ([place]) 08/08/2019   Protein S deficiency ([place]) 08/08/2019   Left leg pain 08/06/2019   Hip fracture ([place]) 01/11/2019   Lumbar radiculopathy 09/04/2018   Abnormal EKG 08/14/2018   Pre-op evaluation 08/14/2018  Anticoagulant long-term use 01/24/2018   Poorly-controlled hypertension 01/24/2018   Osteoporosis with current pathological fracture 09/16/2017   Post-menopausal osteoporosis 09/16/2017   CKD (chronic kidney disease) stage 2, GFR 60-89 ml/min 07/17/2017   Closed compression fracture of L1 vertebra ([place]) 10/22/2016   Closed fracture of left scapula 10/22/2016   Closed fracture of sacrum ([place]) 10/22/2016   Closed fracture of sternum 10/22/2016   Multiple rib fractures 10/22/2016   Trimalleolar fracture of ankle, closed, left, with routine healing, subsequent encounter 03/11/2015   Deep vein thrombosis (DVT) of right lower extremity ([place]) 11/19/2014   Irritable bowel syndrome with diarrhea 11/19/2014    Closed fracture of phalanx of foot 05/15/2013   Knee injury 01/11/2012   Osteopenia 09/18/2011   Hypercalcemia 04/12/2011   Acute thromboembolism of deep veins of lower extremity ([place]) 02/20/2010   Diabetic nephropathy ([place]) 02/20/2010   Vitamin D deficiency 02/20/2010   PCP:  Venia Carbon, MD Pharmacy:   [id number] [place], [id number]: [phone]: [phone]    Social Determinants of Health (SDOH) Interventions    Readmission Risk Interventions     No data to display

## 2022-04-26 NOTE — Progress Notes (Signed)
PROGRESS NOTE    Sierra [name]  LXB:[id number] DOB: 1936-09-03 DOA: 04/23/2022 PCP: Venia Carbon, MD    Assessment & Plan:   Principal Problem:   Acute hypoxemic respiratory failure ([name]) Active Problems:   Sepsis ([name])   TIA (transient ischemic attack)   Hypothyroidism   HTN (hypertension)   Diabetes ([name])   Protein C deficiency ([name])   Protein S deficiency ([name])   Deep vein thrombosis (DVT) of right lower extremity ([name])   HLD (hyperlipidemia)   Ovarian cancer ([name])   AKI (acute kidney injury) ([name])   Respiratory failure ([name])  Assessment and Plan: Acute hypoxic & hypercapnic respiratory failure: etiology unclear. CXR shows mild vascular congestion. Continue on bronchodilators. Weaned off of BiPAP. BiPAP. Concern for pt desaturating at night while sleeping & needed supplemental oxygen and night nurse will document such findings if present   Sepsis: met criteria w/ tachycardia, tachypnea, possible UTI.  Blood cxs NGTD. Urine cx shows containment. Sepsis resolved    Possible UTI: UA was positive. Urine cx shows containment. Unlikely repeat urine cx will grow anything as pt has been on abxs. Continue on IV ceftriaxone   Hypernatremia: resolved   Acute metabolic encephalopathy: secondary to above & hospital delirium. Hx of significant hospital delirium w/ previous hospitalizations as per pt's daughter. Repeat CT head shows no acute intracranial findings   AKI on CKDIIIb: Cr continues to trend down daily   Ovarian cancer: chemo was d/c as it has been ineffective w/ evidence of continued spread. Management per onco outpatient   Likely anemia of chronic disease: likely secondary to ovarian cancer & recent chemo. Will transfuse if Hb <7.0    HLD: continue on statin     DVT of RLE: continue on eliquis   Hypothyroidism: continue on synthroid    Thrombocytopenia: etiology unclear, labile. Will continue to monitor        DVT prophylaxis: eliquis Code Status:  DNR Family Communication: discussed pt's care w/ pt's daughter, [name], and answered her questions   Disposition Plan:  likely back to [place] [facility]   Level of care: Progressive  Status is: Inpatient Remains inpatient appropriate because: severity of illness   Consultants:    Procedures  Antimicrobials: rocephin, azithromycin    Subjective: Pt is still very confused   Objective: Vitals:   04/25/22 1936 04/25/22 2310 04/25/22 2344 04/26/22 0500  BP: (!) 158/86  (!) 178/84 (!) 164/78  Pulse: 60 (!) 59 61 60  Resp: 14 13 17 17   Temp: 97.7 F (36.5 C)  97.8 F (36.6 C) 97.9 F (36.6 C)  TempSrc: Axillary  Axillary Axillary  SpO2: 100% 100% 100% 100%  Weight:      Height:        Intake/Output Summary (Last 24 hours) at 04/26/2022 0754 Last data filed at 04/26/2022 0500 Gross per 24 hour  Intake 1564.57 ml  Output 1850 ml  Net -285.43 ml   Filed Weights   04/23/22 0909  Weight: 88.5 kg    Examination:  General exam: Appears calm but confused Respiratory system: decreased breath sounds b/l Cardiovascular system: S1 & S2+. No rubs or gallops  Gastrointestinal system: Abd is soft, NT, obese & hypoactive bowel sounds Central nervous system: Awake and alert but not oriented. Moves all extremities  Psychiatry: judgement and insight appears poor currently. Flat mood and affect    Data Reviewed: I have personally reviewed following labs and imaging studies  CBC: Recent Labs  Lab 04/23/22 0957 04/24/22 0403 04/25/22 4163  04/26/22 0656  WBC 9.9 15.6* 12.8* 10.5  NEUTROABS 8.6*  --   --   --   HGB 9.7* 11.1* 10.2* 11.5*  HCT 32.2* 38.1 32.6* 36.9  MCV 97.9 100.3* 94.8 92.0  PLT 128* 143* 100* 037*   Basic Metabolic Panel: Recent Labs  Lab 04/23/22 0957 04/24/22 0403 04/25/22 0336 04/26/22 0656  NA 142 144 146* 144  K 3.6 4.0 3.3* 2.9*  CL 110 111 106 99  CO2 26 25 31  36*  GLUCOSE 133* 222* 175* 192*  BUN 39* 33* 35* 27*  CREATININE 1.87* 1.71* 1.55*  1.17*  CALCIUM 8.0* 8.4* 9.2 9.5   GFR: Estimated Creatinine Clearance: 41.6 mL/min (A) (by C-G formula based on SCr of 1.17 mg/dL (H)). Liver Function Tests: Recent Labs  Lab 04/23/22 0957  AST 16  ALT 13  ALKPHOS 47  BILITOT 0.8  PROT 5.6*  ALBUMIN 3.2*   No results for input(s): "LIPASE", "AMYLASE" in the last 168 hours. No results for input(s): "AMMONIA" in the last 168 hours. Coagulation Profile: Recent Labs  Lab 04/23/22 0957 04/24/22 0403  INR 1.2 1.3*   Cardiac Enzymes: No results for input(s): "CKTOTAL", "CKMB", "CKMBINDEX", "TROPONINI" in the last 168 hours. BNP (last 3 results) No results for input(s): "PROBNP" in the last 8760 hours. HbA1C: No results for input(s): "HGBA1C" in the last 72 hours. CBG: Recent Labs  Lab 04/24/22 2018 04/25/22 0750 04/25/22 1133 04/25/22 1614 04/25/22 2057  GLUCAP 147* 163* 163* 171* 145*   Lipid Profile: No results for input(s): "CHOL", "HDL", "LDLCALC", "TRIG", "CHOLHDL", "LDLDIRECT" in the last 72 hours. Thyroid Function Tests: No results for input(s): "TSH", "T4TOTAL", "FREET4", "T3FREE", "THYROIDAB" in the last 72 hours. Anemia Panel: No results for input(s): "VITAMINB12", "FOLATE", "FERRITIN", "TIBC", "IRON", "RETICCTPCT" in the last 72 hours. Sepsis Labs: Recent Labs  Lab 04/23/22 0957 04/24/22 0918  PROCALCITON 1.84  --   LATICACIDVEN 1.2 2.3*    Recent Results (from the past 240 hour(s))  Urine Culture     Status: Abnormal   Collection Time: 04/23/22  1:45 AM   Specimen: In/Out Cath Urine  Result Value Ref Range Status   Specimen Description   Final    IN/OUT CATH URINE Performed at [facility], [address] [place], [place] [address]    Special Requests   Final    NONE Performed at [facility], [address] [place], [place] [address]    Culture MULTIPLE SPECIES PRESENT, SUGGEST RECOLLECTION (A)  Final   Report Status 04/25/2022 FINAL  Final  Blood Culture (routine x  2)     Status: None (Preliminary result)   Collection Time: 04/23/22  7:01 AM   Specimen: BLOOD  Result Value Ref Range Status   Specimen Description BLOOD RIGHT ANTECUBITAL  Final   Special Requests   Final    BOTTLES DRAWN AEROBIC AND ANAEROBIC Blood Culture adequate volume   Culture   Final    NO GROWTH 2 DAYS Performed at [facility], [address], [place], [place] [address]    Report Status PENDING  Incomplete  Resp Panel by RT-PCR (Flu A&B, Covid) Anterior Nasal Swab     Status: None   Collection Time: 04/23/22  9:57 AM   Specimen: Anterior Nasal Swab  Result Value Ref Range Status   SARS Coronavirus 2 by RT PCR NEGATIVE NEGATIVE Final    Comment: (NOTE) SARS-CoV-2 target nucleic acids are NOT DETECTED.  The SARS-CoV-2 RNA is generally detectable in  upper respiratory specimens during the acute phase of infection. The lowest concentration of SARS-CoV-2 viral copies this assay can detect is 138 copies/mL. A negative result does not preclude SARS-Cov-2 infection and should not be used as the sole basis for treatment or other patient management decisions. A negative result may occur with  improper specimen collection/handling, submission of specimen other than nasopharyngeal swab, presence of viral mutation(s) within the areas targeted by this assay, and inadequate number of viral copies(<138 copies/mL). A negative result must be combined with clinical observations, patient history, and epidemiological information. The expected result is Negative.  Fact Sheet for Patients:  EntrepreneurPulse.com.au  Fact Sheet for Healthcare Providers:  IncredibleEmployment.be  This test is no t yet approved or cleared by the [place] FDA and  has been authorized for detection and/or diagnosis of SARS-CoV-2 by FDA under an Emergency Use Authorization (EUA). This EUA will remain  in effect (meaning this test can be used) for the duration  of the COVID-19 declaration under Section 564(b)(1) of the Act, 21 U.S.C.section 360bbb-3(b)(1), unless the authorization is terminated  or revoked sooner.       Influenza A by PCR NEGATIVE NEGATIVE Final   Influenza B by PCR NEGATIVE NEGATIVE Final    Comment: (NOTE) The Xpert Xpress SARS-CoV-2/FLU/RSV plus assay is intended as an aid in the diagnosis of influenza from Nasopharyngeal swab specimens and should not be used as a sole basis for treatment. Nasal washings and aspirates are unacceptable for Xpert Xpress SARS-CoV-2/FLU/RSV testing.  Fact Sheet for Patients: EntrepreneurPulse.com.au  Fact Sheet for Healthcare Providers: IncredibleEmployment.be  This test is not yet approved or cleared by the [place] FDA and has been authorized for detection and/or diagnosis of SARS-CoV-2 by FDA under an Emergency Use Authorization (EUA). This EUA will remain in effect (meaning this test can be used) for the duration of the COVID-19 declaration under Section 564(b)(1) of the Act, 21 U.S.C. section 360bbb-3(b)(1), unless the authorization is terminated or revoked.  Performed at [facility], [address] [place], [place] [address]   Blood Culture (routine x 2)     Status: None (Preliminary result)   Collection Time: 04/23/22 10:00 AM   Specimen: BLOOD  Result Value Ref Range Status   Specimen Description BLOOD BLOOD RIGHT WRIST  Final   Special Requests   Final    BOTTLES DRAWN AEROBIC AND ANAEROBIC Blood Culture adequate volume   Culture   Final    NO GROWTH 2 DAYS Performed at [facility], [address] [place], [place] [address]    Report Status PENDING  Incomplete         Radiology Studies: CT HEAD WO CONTRAST (5MM)  Result Date: 04/24/2022 CLINICAL DATA:  86 year old female with history of altered mental status. EXAM: CT HEAD WITHOUT CONTRAST TECHNIQUE: Contiguous axial images were obtained from the  base of the skull through the vertex without intravenous contrast. RADIATION DOSE REDUCTION: This exam was performed according to the departmental dose-optimization program which includes automated exposure control, adjustment of the mA and/or kV according to patient size and/or use of iterative reconstruction technique. COMPARISON:  Head CT 01/18/2022. FINDINGS: Brain: Mild cerebral and cerebellar atrophy. Patchy and confluent areas of decreased attenuation are noted throughout the deep and periventricular white matter of the cerebral hemispheres bilaterally, compatible with chronic microvascular ischemic disease. No evidence of acute infarction, hemorrhage, hydrocephalus, extra-axial collection or mass lesion/mass effect. Vascular: No hyperdense vessel or unexpected calcification. Skull: Normal. Negative for fracture or focal  lesion. Sinuses/Orbits: No acute finding. Other: None. IMPRESSION: 1. No acute intracranial abnormalities. 2. Mild cerebral and cerebellar atrophy with chronic microvascular ischemic changes in the cerebral white matter, as above. Electronically Signed   By: Vinnie Langton M.D.   On: 04/24/2022 10:40        Scheduled Meds:  apixaban  2.5 mg Oral BID   atorvastatin  40 mg Oral QHS   Chlorhexidine Gluconate Cloth  6 each Topical Daily   DULoxetine  30 mg Oral Daily   famotidine  10 mg Oral QHS   furosemide  40 mg Intravenous Daily   insulin aspart  0-5 Units Subcutaneous QHS   insulin aspart  0-9 Units Subcutaneous TID WC   levothyroxine  75 mcg Oral QAC breakfast   melatonin  2.5 mg Oral QHS   mesalamine  2.4 g Oral Q breakfast   mouth rinse  15 mL Mouth Rinse 4 times per day   potassium chloride  40 mEq Oral BID   thiamine  100 mg Oral Daily   vitamin B-12  1,000 mcg Oral Daily   Continuous Infusions:  azithromycin Stopped (04/25/22 1101)   cefTRIAXone (ROCEPHIN)  IV Stopped (04/25/22 0955)     LOS: 3 days    Time spent: 33 mins     Wyvonnia Dusky,  MD [facility] Pager [phone]  If 7PM-7AM, please contact night-coverage 04/26/2022, 7:54 AM

## 2022-04-27 DIAGNOSIS — E876 Hypokalemia: Secondary | ICD-10-CM | POA: Diagnosis not present

## 2022-04-27 DIAGNOSIS — C569 Malignant neoplasm of unspecified ovary: Secondary | ICD-10-CM

## 2022-04-27 DIAGNOSIS — G9341 Metabolic encephalopathy: Secondary | ICD-10-CM | POA: Diagnosis not present

## 2022-04-27 LAB — BASIC METABOLIC PANEL
Anion gap: 10 (ref 5–15)
BUN: 30 mg/dL — ABNORMAL HIGH (ref 8–23)
CO2: 34 mmol/L — ABNORMAL HIGH (ref 22–32)
Calcium: 9.5 mg/dL (ref 8.9–10.3)
Chloride: 102 mmol/L (ref 98–111)
Creatinine, Ser: 1.42 mg/dL — ABNORMAL HIGH (ref 0.44–1.00)
GFR, Estimated: 37 mL/min — ABNORMAL LOW (ref >60)
Glucose, Bld: 168 mg/dL — ABNORMAL HIGH (ref 70–99)
Potassium: 3.1 mmol/L — ABNORMAL LOW (ref 3.5–5.1)
Sodium: 146 mmol/L — ABNORMAL HIGH (ref 135–145)

## 2022-04-27 LAB — CBC
HCT: 37.7 % (ref 36.0–46.0)
Hemoglobin: 11.9 g/dL — ABNORMAL LOW (ref 12.0–15.0)
MCH: 28.9 pg (ref 26.0–34.0)
MCHC: 31.6 g/dL (ref 30.0–36.0)
MCV: 91.5 fL (ref 80.0–100.0)
Platelets: 125 10*3/uL — ABNORMAL LOW (ref 150–400)
RBC: 4.12 MIL/uL (ref 3.87–5.11)
RDW: 17.9 % — ABNORMAL HIGH (ref 11.5–15.5)
WBC: 8.8 10*3/uL (ref 4.0–10.5)
nRBC: 0 % (ref 0.0–0.2)

## 2022-04-27 LAB — GLUCOSE, CAPILLARY
Glucose-Capillary: 153 mg/dL — ABNORMAL HIGH (ref 70–99)
Glucose-Capillary: 198 mg/dL — ABNORMAL HIGH (ref 70–99)
Glucose-Capillary: 180 mg/dL — ABNORMAL HIGH (ref 70–99)
Glucose-Capillary: 151 mg/dL — ABNORMAL HIGH (ref 70–99)

## 2022-04-27 LAB — MAGNESIUM: Magnesium: 1.4 mg/dL — ABNORMAL LOW (ref 1.7–2.4)

## 2022-04-27 MED ORDER — POTASSIUM CHLORIDE CRYS ER 20 MEQ PO TBCR
40.0000 meq | EXTENDED_RELEASE_TABLET | Freq: Two times a day (BID) | ORAL | Status: AC
  Administered 2022-04-27 (×2): 40 meq via ORAL
  Filled 2022-04-27 (×2): qty 2

## 2022-04-27 MED ORDER — MAGNESIUM SULFATE 2 GM/50ML IV SOLN
2.0000 g | Freq: Once | INTRAVENOUS | Status: AC
  Administered 2022-04-27: 2 g via INTRAVENOUS
  Filled 2022-04-27: qty 50

## 2022-04-27 MED ORDER — AZITHROMYCIN 250 MG PO TABS
500.0000 mg | ORAL_TABLET | Freq: Every day | ORAL | Status: AC
  Administered 2022-04-27: 500 mg via ORAL
  Filled 2022-04-27: qty 2

## 2022-04-27 NOTE — Progress Notes (Signed)
PROGRESS NOTE   HPI was taken from Dr. Tobie Poet: 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   In the emergency department showed temperature of 97.9, respiration rate of 18, heart rate 61, blood pressure upon EMS arrival was 77/46 with gradual improvement to 94/60 and then 122/66.  SPO2 of 90% on room air and improved to 96% on 2 L nasal cannula.   Per report at [facility], The patient has saturation of 85% on room air, nausea, vomiting, diarrhea, altered mental status.   Serum sodium 142, potassium 3.6, chloride 110, bicarb 26, BUN of 39, serum creatinine 1.87, GFR 26, nonfasting blood glucose 133, WBC 9.9, hemoglobin 9.7, platelets of 128.  Lactic acid was 1.2.  COVID/influenza A/influenza B PCR were negative.   ED treatment: Azithromycin, ceftriaxone 2 g IV, sodium chloride 1 L bolus.   At bedside patient was able to tell me her name and identify her daughter.  She was able to tell me her age and she knows she is in the hospital.  She appears to be in no acute distress.  No respiratory accessory muscle use in process.  Nasal cannula in place.   She was very tired and exhausted as she had minimal sleep last night and daughter provided most of the HPI.  Daughter states that on 04/22/2022, patient has been having multiple episodes of vomiting up her food and explosive diarrhea.  EMS was called because she was not acting "right ".   Daughter states that it was reported she also had difficulty with her oxygen level.   Daughter denies known fever.  Patient denies being in pain or shortness  of breath at this time.   As per Dr. Jimmye Norman 6/17-6/20/23: Pt presented w/ acute hypoxic & hypercapnic respiratory failure of unknown etiology. CXR did show mild vascular congestion. Blood cxs NGTD. Urine cx shows containment. Pt has been IV abxs and just completed the course. Pt was initially placed BiPAP for increase CO2 retention but since been weaned off. Of note, pt's mental status is very altered. Pt is oriented to self only but pt has hx of significant hospital delirium on previous hospitalizations as per pt's daughter. CT head x 2 neg for acute intracranial findings. MRI brain cannot be done here as pt has leadless pacemaker. Pt's mental status should return to baseline once pt is back in her home environment as per pt's daughter. Pt was several electrolyte abnormalities today and can possibly d/c back home tomorrow. Pt has had electrolyte abnormalities in the past & has been unable to drink adequate fluids as per pt's daughter     Sierra [name]  ZOX:[id number] DOB: [birth year] DOA: 04/23/2022 PCP: Venia Carbon, MD    Assessment & Plan:   Principal Problem:   Acute hypoxemic respiratory failure ([place]) Active Problems:   Sepsis ([place])   TIA (transient ischemic attack)   Hypothyroidism   HTN (hypertension)   Diabetes ([place])   Protein C deficiency ([place])   Protein S deficiency ([place])   Deep vein thrombosis (DVT) of right lower extremity ([place])   HLD (hyperlipidemia)   Ovarian cancer ([place])   AKI (acute kidney injury) ([place])   Respiratory failure ([place])  Assessment and Plan: Acute hypoxic & hypercapnic respiratory failure: etiology unclear. CXR shows mild vascular congestion. Continue on bronchodilators. Weaned off of BiPAP. BiPAP prn. Likely needs [place] at night as pt often desaturations when sleep. Would benefit from outpatient sleep study    Sepsis: met criteria w/ tachycardia, tachypnea, possible UTI.  Blood cxs NGTD. Urine cx shows containment. Sepsis resolved    Possible UTI: UA was  positive. Urine cx shows containment. Unlikely repeat urine cx will grow anything as pt has been on abxs. Completed abx course   Hypernatremia: encourage po intake  Hypokalemia: potassium given  Hypomagnesemia: mg sulfate given   Acute metabolic encephalopathy: secondary to above & hospital delirium. Hx of significant hospital delirium w/ previous hospitalizations & mental status should return to baseline when pt is back in her normal environment as per pt's daughter. Repeat CT head shows no acute intracranial findings   AKI on CKDIIIb: Cr is trending up today. Encourage fluid intake   Ovarian cancer: chemo was d/c as it has been ineffective w/ evidence of continued spread. Management per onco as an outpatient   Likely anemia of chronic disease: likely secondary to ovarian cancer & recent chemo. Will transfuse if Hb <7.0    HLD: continue on tele    DVT of RLE: continue on eliquis   Hypothyroidism: continue on levothyroxine   Thrombocytopenia: etiology unclear, labile. Will continue to monitor        DVT prophylaxis: eliquis Code Status: DNR Family Communication: discussed pt's care w/ pt's daughter, Sierra [name], and answered her questions   Disposition Plan:  likely back to [place]   Level of care: Progressive  Status is: Inpatient Remains inpatient appropriate because: severity of illness   Consultants:    Procedures  Antimicrobials:    Subjective: Pt is oriented to self only   Objective: Vitals:   04/26/22 1715 04/26/22 1940 04/26/22 2330 04/27/22 0514  BP: (!) 151/76 130/69 129/78 (!) 150/81  Pulse: 68 62 61 (!) 58  Resp: 17 18 18 18   Temp: 98.5 F (36.9 C) 97.6 F (36.4 C) 98.4 F (36.9 C) (!) 97.5 F (36.4 C)  TempSrc: Axillary     SpO2: 95% 93% 96% 96%  Weight:      Height:        Intake/Output Summary (Last 24 hours) at 04/27/2022 0811 Last data filed at 04/26/2022 1351 Gross per 24 hour  Intake --  Output 1500 ml  Net -1500 ml   Filed  Weights   04/23/22 0909  Weight: 88.5 kg    Examination:  General exam: Appears comfortable but confused  Respiratory system: diminished breath sounds b/l  Cardiovascular system: S1/S2+. No rubs or clicks  Gastrointestinal system: Abd is soft, NT, obese & hypoactive bowel sounds Central nervous system: Awake & oriented to self only. Moves all extremities  Psychiatry: judgement and insight appears poor currently. Flat mood and affect     Data Reviewed: I have personally reviewed following labs and imaging studies  CBC: Recent Labs  Lab 04/23/22 0957 04/24/22 0403 04/25/22 0336 04/26/22 0656 04/27/22 0633  WBC 9.9 15.6* 12.8* 10.5 8.8  NEUTROABS 8.6*  --   --   --   --   HGB 9.7* 11.1* 10.2* 11.5* 11.9*  HCT 32.2* 38.1 32.6* 36.9 37.7  MCV 97.9 100.3* 94.8 92.0 91.5  PLT 128* 143* 100* 117* 456*   Basic Metabolic Panel: Recent Labs  Lab 04/23/22 0957 04/24/22 0403 04/25/22 0336 04/26/22 0656 04/27/22 2563  NA  142 144 146* 144 146*  K 3.6 4.0 3.3* 2.9* 3.1*  CL 110 111 106 99 102  CO2 26 25 31  36* 34*  GLUCOSE 133* 222* 175* 192* 168*  BUN 39* 33* 35* 27* 30*  CREATININE 1.87* 1.71* 1.55* 1.17* 1.42*  CALCIUM 8.0* 8.4* 9.2 9.5 9.5  MG  --   --   --   --  1.4*   GFR: Estimated Creatinine Clearance: 34.3 mL/min (A) (by C-G formula based on SCr of 1.42 mg/dL (H)). Liver Function Tests: Recent Labs  Lab 04/23/22 0957  AST 16  ALT 13  ALKPHOS 47  BILITOT 0.8  PROT 5.6*  ALBUMIN 3.2*   No results for input(s): "LIPASE", "AMYLASE" in the last 168 hours. No results for input(s): "AMMONIA" in the last 168 hours. Coagulation Profile: Recent Labs  Lab 04/23/22 0957 04/24/22 0403  INR 1.2 1.3*   Cardiac Enzymes: No results for input(s): "CKTOTAL", "CKMB", "CKMBINDEX", "TROPONINI" in the last 168 hours. BNP (last 3 results) No results for input(s): "PROBNP" in the last 8760 hours. HbA1C: No results for input(s): "HGBA1C" in the last 72  hours. CBG: Recent Labs  Lab 04/25/22 2057 04/26/22 0812 04/26/22 1224 04/26/22 1717 04/26/22 2044  GLUCAP 145* 182* 172* 173* 154*   Lipid Profile: No results for input(s): "CHOL", "HDL", "LDLCALC", "TRIG", "CHOLHDL", "LDLDIRECT" in the last 72 hours. Thyroid Function Tests: No results for input(s): "TSH", "T4TOTAL", "FREET4", "T3FREE", "THYROIDAB" in the last 72 hours. Anemia Panel: No results for input(s): "VITAMINB12", "FOLATE", "FERRITIN", "TIBC", "IRON", "RETICCTPCT" in the last 72 hours. Sepsis Labs: Recent Labs  Lab 04/23/22 0957 04/24/22 0918  PROCALCITON 1.84  --   LATICACIDVEN 1.2 2.3*    Recent Results (from the past 240 hour(s))  Urine Culture     Status: Abnormal   Collection Time: 04/23/22  1:45 AM   Specimen: In/Out Cath Urine  Result Value Ref Range Status   Specimen Description   Final    IN/OUT CATH URINE Performed at [facility], [address] [place], [place] [address]    Special Requests   Final    NONE Performed at [facility], [address] [place], [place] [address]    Culture MULTIPLE SPECIES PRESENT, SUGGEST RECOLLECTION (A)  Final   Report Status 04/25/2022 FINAL  Final  Blood Culture (routine x 2)     Status: None (Preliminary result)   Collection Time: 04/23/22  7:01 AM   Specimen: BLOOD  Result Value Ref Range Status   Specimen Description BLOOD RIGHT ANTECUBITAL  Final   Special Requests   Final    BOTTLES DRAWN AEROBIC AND ANAEROBIC Blood Culture adequate volume   Culture   Final    NO GROWTH 2 DAYS Performed at [facility], [address] [place], [place] [address]    Report Status PENDING  Incomplete  Resp Panel by RT-PCR (Flu A&B, Covid) Anterior Nasal Swab     Status: None   Collection Time: 04/23/22  9:57 AM   Specimen: Anterior Nasal Swab  Result Value Ref Range Status   SARS Coronavirus 2 by RT PCR NEGATIVE NEGATIVE Final    Comment: (NOTE) SARS-CoV-2 target nucleic acids are NOT  DETECTED.  The SARS-CoV-2 RNA is generally detectable in upper respiratory specimens during the acute phase of infection. The lowest concentration of SARS-CoV-2 viral copies this assay can detect is 138 copies/mL. A negative result does not preclude SARS-Cov-2 infection and should not be used as the sole basis  for treatment or other patient management decisions. A negative result may occur with  improper specimen collection/handling, submission of specimen other than nasopharyngeal swab, presence of viral mutation(s) within the areas targeted by this assay, and inadequate number of viral copies(<138 copies/mL). A negative result must be combined with clinical observations, patient history, and epidemiological information. The expected result is Negative.  Fact Sheet for Patients:  EntrepreneurPulse.com.au  Fact Sheet for Healthcare Providers:  IncredibleEmployment.be  This test is no t yet approved or cleared by the [place] FDA and  has been authorized for detection and/or diagnosis of SARS-CoV-2 by FDA under an Emergency Use Authorization (EUA). This EUA will remain  in effect (meaning this test can be used) for the duration of the COVID-19 declaration under Section 564(b)(1) of the Act, 21 U.S.C.section 360bbb-3(b)(1), unless the authorization is terminated  or revoked sooner.       Influenza A by PCR NEGATIVE NEGATIVE Final   Influenza B by PCR NEGATIVE NEGATIVE Final    Comment: (NOTE) The Xpert Xpress SARS-CoV-2/FLU/RSV plus assay is intended as an aid in the diagnosis of influenza from Nasopharyngeal swab specimens and should not be used as a sole basis for treatment. Nasal washings and aspirates are unacceptable for Xpert Xpress SARS-CoV-2/FLU/RSV testing.  Fact Sheet for Patients: EntrepreneurPulse.com.au  Fact Sheet for Healthcare Providers: IncredibleEmployment.be  This test is not yet  approved or cleared by the [place] FDA and has been authorized for detection and/or diagnosis of SARS-CoV-2 by FDA under an Emergency Use Authorization (EUA). This EUA will remain in effect (meaning this test can be used) for the duration of the COVID-19 declaration under Section 564(b)(1) of the Act, 21 U.S.C. section 360bbb-3(b)(1), unless the authorization is terminated or revoked.  Performed at [facility], [address] [place], [place] [address]   Blood Culture (routine x 2)     Status: None (Preliminary result)   Collection Time: 04/23/22 10:00 AM   Specimen: BLOOD  Result Value Ref Range Status   Specimen Description BLOOD BLOOD RIGHT WRIST  Final   Special Requests   Final    BOTTLES DRAWN AEROBIC AND ANAEROBIC Blood Culture adequate volume   Culture   Final    NO GROWTH 2 DAYS Performed at [facility], [address], [place], [place] [address]    Report Status PENDING  Incomplete         Radiology Studies: CT HEAD WO CONTRAST (5MM)  Result Date: 04/26/2022 CLINICAL DATA:  Altered mental status EXAM: CT HEAD WITHOUT CONTRAST TECHNIQUE: Contiguous axial images were obtained from the base of the skull through the vertex without intravenous contrast. RADIATION DOSE REDUCTION: This exam was performed according to the departmental dose-optimization program which includes automated exposure control, adjustment of the mA and/or kV according to patient size and/or use of iterative reconstruction technique. COMPARISON:  04/24/2022 FINDINGS: Brain: No evidence of acute infarction, hemorrhage, hydrocephalus, extra-axial collection or mass lesion/mass effect. Periventricular and deep white matter hypodensity. Vascular: No hyperdense vessel or unexpected calcification. Skull: Normal. Negative for fracture or focal lesion. Sinuses/Orbits: No acute finding. Other: None. IMPRESSION: No acute intracranial pathology. Small-vessel white matter disease.  Electronically Signed   By: Delanna Ahmadi M.D.   On: 04/26/2022 13:23   DG Chest Port 1 View  Result Date: 04/26/2022 CLINICAL DATA:  Dyspnea history of diabetes mellitus and hypertension. EXAM: PORTABLE CHEST 1 VIEW COMPARISON:  Chest radiograph April 23, 2022. FINDINGS: Patient is rotated to the right. Accessed right chest  Port-A-Cath with tip near the superior cavoatrial junction. Mild cardiac enlargement with central vascular prominence. No overt pulmonary edema or focal airspace consolidation. No visible pleural effusion or pneumothorax. The visualized skeletal structures are unchanged. IMPRESSION: Similar cardiomegaly and mild vascular congestion without overt pulmonary edema or focal airspace consolidation. Electronically Signed   By: Dahlia Bailiff M.D.   On: 04/26/2022 08:04        Scheduled Meds:  apixaban  2.5 mg Oral BID   atorvastatin  40 mg Oral QHS   Chlorhexidine Gluconate Cloth  6 each Topical Daily   DULoxetine  30 mg Oral Daily   famotidine  10 mg Oral QHS   furosemide  40 mg Intravenous Daily   insulin aspart  0-5 Units Subcutaneous QHS   insulin aspart  0-9 Units Subcutaneous TID WC   levothyroxine  75 mcg Oral QAC breakfast   lisinopril  10 mg Oral Daily   melatonin  2.5 mg Oral QHS   mesalamine  2.4 g Oral Q breakfast   mouth rinse  15 mL Mouth Rinse 4 times per day   thiamine  100 mg Oral Daily   vitamin B-12  1,000 mcg Oral Daily   Continuous Infusions:  azithromycin 500 mg (04/26/22 0903)   cefTRIAXone (ROCEPHIN)  IV 2 g (04/26/22 1154)     LOS: 4 days    Time spent: 25 mins     Wyvonnia Dusky, MD [facility] Pager [phone]  If 7PM-7AM, please contact night-coverage 04/27/2022, 8:11 AM

## 2022-04-27 NOTE — Plan of Care (Signed)

## 2022-04-27 NOTE — Progress Notes (Signed)
Patient was placed on 2L nasal cannula during the evening due to oxygen saturations being 89%. After being placed on 2L patient remained oxygenated at 95-97% during the evening.

## 2022-04-27 NOTE — Progress Notes (Signed)
PHARMACIST - PHYSICIAN COMMUNICATION DR:   Jimmye Norman CONCERNING: Antibiotic IV to Oral Route Change Policy  RECOMMENDATION: This patient is receiving Azithromycin by the intravenous route.  Based on criteria approved by the Pharmacy and Therapeutics Committee, the antibiotic(s) is/are being converted to the equivalent oral dose form(s).  *Patient also loss IV access this AM*  DESCRIPTION: These criteria include: Patient being treated for a respiratory tract infection, urinary tract infection, cellulitis or clostridium difficile associated diarrhea if on metronidazole The patient is not neutropenic and does not exhibit a GI malabsorption state The patient is eating (either orally or via tube) and/or has been taking other orally administered medications for a least 24 hours The patient is improving clinically and has a Tmax < 100.5  If you have questions about this conversion, please contact the Pharmacy Department  '[]'$   [phone] )  [facility] '[]'$   [phone] )  [facility]  '[]'$   [phone] )  [facility] '[]'$   [phone] )  Reid Hope King Rodriguez-Guzman PharmD, BCPS 04/27/2022 12:12 PM

## 2022-04-28 DIAGNOSIS — I10 Essential (primary) hypertension: Secondary | ICD-10-CM

## 2022-04-28 DIAGNOSIS — N179 Acute kidney failure, unspecified: Secondary | ICD-10-CM | POA: Diagnosis not present

## 2022-04-28 DIAGNOSIS — N1832 Chronic kidney disease, stage 3b: Secondary | ICD-10-CM

## 2022-04-28 DIAGNOSIS — R6520 Severe sepsis without septic shock: Secondary | ICD-10-CM

## 2022-04-28 DIAGNOSIS — J9601 Acute respiratory failure with hypoxia: Secondary | ICD-10-CM | POA: Diagnosis not present

## 2022-04-28 DIAGNOSIS — E785 Hyperlipidemia, unspecified: Secondary | ICD-10-CM

## 2022-04-28 DIAGNOSIS — R410 Disorientation, unspecified: Secondary | ICD-10-CM | POA: Diagnosis not present

## 2022-04-28 DIAGNOSIS — E1122 Type 2 diabetes mellitus with diabetic chronic kidney disease: Secondary | ICD-10-CM

## 2022-04-28 DIAGNOSIS — E039 Hypothyroidism, unspecified: Secondary | ICD-10-CM

## 2022-04-28 DIAGNOSIS — D6859 Other primary thrombophilia: Secondary | ICD-10-CM

## 2022-04-28 DIAGNOSIS — G9340 Encephalopathy, unspecified: Secondary | ICD-10-CM

## 2022-04-28 DIAGNOSIS — A419 Sepsis, unspecified organism: Secondary | ICD-10-CM | POA: Diagnosis not present

## 2022-04-28 LAB — BASIC METABOLIC PANEL
Anion gap: 9 (ref 5–15)
BUN: 26 mg/dL — ABNORMAL HIGH (ref 8–23)
CO2: 31 mmol/L (ref 22–32)
Calcium: 9.9 mg/dL (ref 8.9–10.3)
Chloride: 104 mmol/L (ref 98–111)
Creatinine, Ser: 1.32 mg/dL — ABNORMAL HIGH (ref 0.44–1.00)
GFR, Estimated: 40 mL/min — ABNORMAL LOW (ref >60)
Glucose, Bld: 196 mg/dL — ABNORMAL HIGH (ref 70–99)
Potassium: 3.5 mmol/L (ref 3.5–5.1)
Sodium: 144 mmol/L (ref 135–145)

## 2022-04-28 LAB — CULTURE, BLOOD (ROUTINE X 2)
Culture: NO GROWTH
Culture: NO GROWTH
Special Requests: ADEQUATE
Special Requests: ADEQUATE

## 2022-04-28 LAB — CBC
HCT: 38 % (ref 36.0–46.0)
Hemoglobin: 12.2 g/dL (ref 12.0–15.0)
MCH: 29.3 pg (ref 26.0–34.0)
MCHC: 32.1 g/dL (ref 30.0–36.0)
MCV: 91.3 fL (ref 80.0–100.0)
Platelets: 118 10*3/uL — ABNORMAL LOW (ref 150–400)
RBC: 4.16 MIL/uL (ref 3.87–5.11)
RDW: 17.6 % — ABNORMAL HIGH (ref 11.5–15.5)
WBC: 10.1 10*3/uL (ref 4.0–10.5)
nRBC: 0 % (ref 0.0–0.2)

## 2022-04-28 LAB — MAGNESIUM: Magnesium: 2 mg/dL (ref 1.7–2.4)

## 2022-04-28 LAB — GLUCOSE, CAPILLARY
Glucose-Capillary: 191 mg/dL — ABNORMAL HIGH (ref 70–99)
Glucose-Capillary: 304 mg/dL — ABNORMAL HIGH (ref 70–99)

## 2022-04-28 MED ORDER — HEPARIN SOD (PORK) LOCK FLUSH 100 UNIT/ML IV SOLN
500.0000 [IU] | Freq: Once | INTRAVENOUS | Status: AC
  Administered 2022-04-28: 500 [IU] via INTRAVENOUS
  Filled 2022-04-28: qty 5

## 2022-04-28 NOTE — Discharge Summary (Signed)
Physician Discharge Summary   Patient: Sierra [name] MRN: [id number] DOB: [birth year]  Admit date:     04/23/2022  Discharge date: 04/28/22  Discharge Physician: Loletha Grayer   PCP: Venia Carbon, MD   Recommendations at discharge:   Follow-up with Dr. Silvio Pate at facility in 2 days  Discharge Diagnoses: Principal Problem:   Acute respiratory failure with hypoxia and hypercapnia ([employer]) Active Problems:   Sepsis ([place])   Delirium   Hypothyroidism   HTN (hypertension)   Diabetes ([place])   Protein C deficiency ([place])   Protein S deficiency ([place])   Deep vein thrombosis (DVT) of right lower extremity ([name])   HLD (hyperlipidemia)   Ovarian cancer ([name])   AKI (acute kidney injury) [facility])    Hospital Course: Sierra [name] is a 86 year old female with history of DVT and protein C and S deficiency on Eliquis, hyperlipidemia, hypothyroid, depression, insomnia, 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  In the emergency department showed temperature of 97.9, respiration rate of 18, heart rate 61, blood pressure upon EMS arrival was 77/46 with gradual improvement to 94/60 and then 122/66.  SPO2 of 90% on room air and improved to 96% on 2 L nasal cannula.  Per report at [facility] patient has saturation of 85% on room air, nausea, vomiting, diarrhea, altered mental status.  Serum sodium 142, potassium 3.6, chloride 110, bicarb 26, BUN of 39, serum creatinine 1.87, GFR 26, nonfasting blood glucose 133, WBC 9.9, hemoglobin 9.7, platelets of 128.  Lactic acid was 1.2.  COVID/influenza A/influenza B PCR were negative.  As per Dr. Jimmye Norman 6/17-6/20/23: Pt presented w/ acute hypoxic & hypercapnic respiratory failure of unknown  etiology. CXR did show mild vascular congestion. Blood cxs NGTD. Urine cx shows containment. Pt has been IV abxs and just completed the course. Pt was initially placed BiPAP for increase CO2 retention but since been weaned off. Of note, pt's mental status is very altered. Pt is oriented to self only but pt has hx of significant hospital delirium on previous hospitalizations as per pt's daughter. CT head x 2 neg for acute intracranial findings. MRI brain cannot be done here as pt has leadless pacemaker. Pt's mental status should return to baseline once pt is back in her home environment as per pt's daughter. Pt was several electrolyte abnormalities today. Pt has had electrolyte abnormalities in the past & has been unable to drink adequate fluids as per pt's daughter.  Recommend checking labs next week.  Continue oxygen 2 L at night and for any pulse ox less than 88%.     Assessment and Plan: * Acute respiratory failure with hypoxia and hypercapnia ([name]) The patient had an elevated PCO2 of 83 on presentation.  Care facility is unable to do trilogy machine or BiPAP.  Patient will go home on home oxygen 2 L at night.  Patient able to come off oxygen during the day.  Delirium Patient able to answer some questions but forgetful and other answers.  Patient's daughter is okay with getting her back into her familiar environment where mental status hopefully will get back to her baseline.  Sepsis ([place]) Present on admission, met criteria with tachycardia tachypnea and possible UTI.  Patient completed antibiotics here in the hospital.  AKI (acute kidney injury) ([place]) On CKD 3 B Last  creatinine 1.32 with a GFR of 40. Recommend checking BMP and then can consider restarting Lasix.  Ovarian cancer ([place]) - Status post resection, IV and then oral chemotherapy - Per daughter at bedside, as of about 2 weeks ago chemotherapy has been discontinued due to provider stating that the chemotherapy has been not been  effective and there is evidence of continued spread - Follow-up with Dr. Silvio Pate for a palliative talks as outpatient.  HLD (hyperlipidemia) Continue atorvastatin  Deep vein thrombosis (DVT) of right lower extremity ([name]) Continue Eliquis  Protein S deficiency ([name]) Continue Eliquis.  Protein C deficiency ([place]) Continue Eliquis   Diabetes ([place]) Continue to hold metformin.  Can go back on glipizide and Januvia as outpatient.  Last sugar elevated.  On sliding scale here while in the hospital.  HTN (hypertension) Can go back on lisinopril.  Hold Lasix for right now  Hypothyroidism Continue levothyroxine         Consultants: None Procedures performed: None Disposition: Back to long-term care Diet recommendation:  Cardiac and Carb modified diet DISCHARGE MEDICATION: Allergies as of 04/28/2022       Reactions   Gentamicin Other (See Comments)   Kidney function   Morphine And Related Other (See Comments)   AMS - agitation and delusions **No legal documents to be signed if taking, per family**        Medication List     STOP taking these medications    calcium carbonate 1500 (600 Ca) MG Tabs tablet Commonly known as: OSCAL   furosemide 20 MG tablet Commonly known as: LASIX   metFORMIN 1000 MG tablet Commonly known as: GLUCOPHAGE   pregabalin 150 MG capsule Commonly known as: LYRICA       TAKE these medications    acetaminophen 325 MG tablet Commonly known as: TYLENOL Take 650 mg by mouth every 6 (six) hours as needed for moderate pain or mild pain (pain).   alendronate 70 MG tablet Commonly known as: FOSAMAX Take 70 mg by mouth every Sunday.   Apriso 0.375 g 24 hr capsule Generic drug: mesalamine Take 1.5 g by mouth daily at 12 noon.   atorvastatin 40 MG tablet Commonly known as: LIPITOR Take 40 mg by mouth daily.   calcium-vitamin D 500-5 MG-MCG tablet Commonly known as: OSCAL WITH D Take 1 tablet by mouth 2 (two) times daily.    cholecalciferol 25 MCG (1000 UNIT) tablet Commonly known as: VITAMIN D3 Take 2,000 Units by mouth daily. (taken with calcium)   DULoxetine 30 MG capsule Commonly known as: CYMBALTA Take 30 mg by mouth daily.   Eliquis 2.5 MG Tabs tablet Generic drug: apixaban Take 2.5 mg by mouth 2 (two) times daily.   famotidine 10 MG tablet Commonly known as: PEPCID Take 10 mg by mouth at bedtime.   glipiZIDE 2.5 MG 24 hr tablet Commonly known as: GLUCOTROL XL Take 2.5 mg by mouth daily.   levothyroxine 75 MCG tablet Commonly known as: SYNTHROID Take 75 mcg by mouth daily before breakfast.   lisinopril 10 MG tablet Commonly known as: ZESTRIL Take 10 mg by mouth daily.   melatonin 3 MG Tabs tablet Take 3 mg by mouth at bedtime.   omeprazole 40 MG capsule Commonly known as: PRILOSEC Take 40 mg by mouth daily.   ondansetron 4 MG tablet Commonly known as: ZOFRAN Take 4 mg by mouth every 8 (eight) hours as needed for nausea or vomiting.   sennosides-docusate sodium 8.6-50 MG tablet Commonly known as: SENOKOT-S Take 1  tablet by mouth daily.   sitaGLIPtin 25 MG tablet Commonly known as: JANUVIA Take 1 tablet by mouth daily.   thiamine 100 MG tablet Take 100 mg by mouth daily.   vitamin B-12 1000 MCG tablet Commonly known as: CYANOCOBALAMIN Take 1,000 mcg by mouth daily.   Voltaren 1 % Gel Generic drug: diclofenac Sodium Apply 2 g topically 3 (three) times daily as needed.        Follow-up Information     Viviana Simpler I, MD Follow up in 2 day(s).   Specialties: Internal Medicine, Pediatrics Contact information: [address] [place] [address] [phone]                Discharge Exam: Danley Danker Weights   04/23/22 0909  Weight: 88.5 kg   Physical Exam HENT:     Head: Normocephalic.     Mouth/Throat:     Pharynx: No oropharyngeal exudate.  Eyes:     General: Lids are normal.     Conjunctiva/sclera: Conjunctivae normal.  Cardiovascular:      Rate and Rhythm: Normal rate and regular rhythm.     Heart sounds: Normal heart sounds, S1 normal and S2 normal.  Pulmonary:     Breath sounds: No decreased breath sounds, wheezing, rhonchi or rales.  Abdominal:     Palpations: Abdomen is soft.     Tenderness: There is no abdominal tenderness.  Musculoskeletal:     Right lower leg: Swelling present.     Left lower leg: Swelling present.  Skin:    General: Skin is warm.     Findings: No rash.  Neurological:     Mental Status: She is alert.      Condition at discharge: fair  The results of significant diagnostics from this hospitalization (including imaging, microbiology, ancillary and laboratory) are listed below for reference.   Imaging Studies: CT HEAD WO CONTRAST (5MM)  Result Date: 04/26/2022 CLINICAL DATA:  Altered mental status EXAM: CT HEAD WITHOUT CONTRAST TECHNIQUE: Contiguous axial images were obtained from the base of the skull through the vertex without intravenous contrast. RADIATION DOSE REDUCTION: This exam was performed according to the departmental dose-optimization program which includes automated exposure control, adjustment of the mA and/or kV according to patient size and/or use of iterative reconstruction technique. COMPARISON:  04/24/2022 FINDINGS: Brain: No evidence of acute infarction, hemorrhage, hydrocephalus, extra-axial collection or mass lesion/mass effect. Periventricular and deep white matter hypodensity. Vascular: No hyperdense vessel or unexpected calcification. Skull: Normal. Negative for fracture or focal lesion. Sinuses/Orbits: No acute finding. Other: None. IMPRESSION: No acute intracranial pathology. Small-vessel white matter disease. Electronically Signed   By: Delanna Ahmadi M.D.   On: 04/26/2022 13:23   DG Chest Port 1 View  Result Date: 04/26/2022 CLINICAL DATA:  Dyspnea history of diabetes mellitus and hypertension. EXAM: PORTABLE CHEST 1 VIEW COMPARISON:  Chest radiograph April 23, 2022.  FINDINGS: Patient is rotated to the right. Accessed right chest Port-A-Cath with tip near the superior cavoatrial junction. Mild cardiac enlargement with central vascular prominence. No overt pulmonary edema or focal airspace consolidation. No visible pleural effusion or pneumothorax. The visualized skeletal structures are unchanged. IMPRESSION: Similar cardiomegaly and mild vascular congestion without overt pulmonary edema or focal airspace consolidation. Electronically Signed   By: Dahlia Bailiff M.D.   On: 04/26/2022 08:04   CT HEAD WO CONTRAST (5MM)  Result Date: 04/24/2022 CLINICAL DATA:  86 year old female with history of altered mental status. EXAM: CT HEAD WITHOUT CONTRAST TECHNIQUE: Contiguous axial images  were obtained from the base of the skull through the vertex without intravenous contrast. RADIATION DOSE REDUCTION: This exam was performed according to the departmental dose-optimization program which includes automated exposure control, adjustment of the mA and/or kV according to patient size and/or use of iterative reconstruction technique. COMPARISON:  Head CT 01/18/2022. FINDINGS: Brain: Mild cerebral and cerebellar atrophy. Patchy and confluent areas of decreased attenuation are noted throughout the deep and periventricular white matter of the cerebral hemispheres bilaterally, compatible with chronic microvascular ischemic disease. No evidence of acute infarction, hemorrhage, hydrocephalus, extra-axial collection or mass lesion/mass effect. Vascular: No hyperdense vessel or unexpected calcification. Skull: Normal. Negative for fracture or focal lesion. Sinuses/Orbits: No acute finding. Other: None. IMPRESSION: 1. No acute intracranial abnormalities. 2. Mild cerebral and cerebellar atrophy with chronic microvascular ischemic changes in the cerebral white matter, as above. Electronically Signed   By: Vinnie Langton M.D.   On: 04/24/2022 10:40   NM Pulmonary Perfusion  Result Date:  04/23/2022 CLINICAL DATA:  Pulmonary embolism suspected, high probability. Leg swelling and hypoxemia. EXAM: NUCLEAR MEDICINE PERFUSION LUNG SCAN TECHNIQUE: Perfusion images were obtained in multiple projections after intravenous injection of radiopharmaceutical. Ventilation scans intentionally deferred if perfusion scan and chest x-ray adequate for interpretation during COVID 19 epidemic. RADIOPHARMACEUTICALS:  4.08 mCi Tc-30mMAA IV COMPARISON:  Radiographs 04/23/2022 and 07/10/2020. FINDINGS: There are no wedge-shaped perfusion defects suspicious for pulmonary embolism. Central right lung defect on the RPO view attributed to the right hilum. IMPRESSION: Pulmonary embolism absent per PISAPED criteria. Electronically Signed   By: WRichardean SaleM.D.   On: 04/23/2022 14:46   UKoreaVenous Img Lower Bilateral  Result Date: 04/23/2022 CLINICAL DATA:  Pain and swelling EXAM: BILATERAL LOWER EXTREMITY VENOUS DOPPLER ULTRASOUND TECHNIQUE: Gray-scale sonography with graded compression, as well as color Doppler and duplex ultrasound were performed to evaluate the lower extremity deep venous systems from the level of the common femoral vein and including the common femoral, femoral, profunda femoral, popliteal and calf veins including the posterior tibial, peroneal and gastrocnemius veins when visible. The superficial great saphenous vein was also interrogated. Spectral Doppler was utilized to evaluate flow at rest and with distal augmentation maneuvers in the common femoral, femoral and popliteal veins. COMPARISON:  None Available. FINDINGS: RIGHT LOWER EXTREMITY Common Femoral Vein: No evidence of thrombus. Normal compressibility, respiratory phasicity and response to augmentation. Saphenofemoral Junction: No evidence of thrombus. Normal compressibility and flow on color Doppler imaging. Profunda Femoral Vein: No evidence of thrombus. Normal compressibility and flow on color Doppler imaging. Femoral Vein: No evidence of  thrombus. Normal compressibility, respiratory phasicity and response to augmentation. Popliteal Vein: No evidence of thrombus. Normal compressibility, respiratory phasicity and response to augmentation. Calf Veins: No evidence of thrombus. Normal compressibility and flow on color Doppler imaging. Superficial Great Saphenous Vein: No evidence of thrombus. Normal compressibility. Venous Reflux:  None. Other Findings:  None. LEFT LOWER EXTREMITY Common Femoral Vein: No evidence of thrombus. Normal compressibility, respiratory phasicity and response to augmentation. Saphenofemoral Junction: No evidence of thrombus. Normal compressibility and flow on color Doppler imaging. Profunda Femoral Vein: No evidence of thrombus. Normal compressibility and flow on color Doppler imaging. Femoral Vein: No evidence of thrombus. Normal compressibility, respiratory phasicity and response to augmentation. Popliteal Vein: No evidence of thrombus. Normal compressibility, respiratory phasicity and response to augmentation. Calf Veins: No evidence of thrombus. Normal compressibility and flow on color Doppler imaging. Superficial Great Saphenous Vein: No evidence of thrombus. Normal compressibility. Venous Reflux:  None. Other  Findings: There is edema in the subcutaneous plane in the calves, more so on the left side without any loculated fluid collections. IMPRESSION: No evidence of deep venous thrombosis in either lower extremity. Electronically Signed   By: Elmer Picker M.D.   On: 04/23/2022 13:23   DG Chest Port 1 View  Result Date: 04/23/2022 CLINICAL DATA:  Ulnar mental status.  Hypoxemia. EXAM: PORTABLE CHEST 1 VIEW COMPARISON:  Radiographs 07/10/2020 and 09/18/2019. FINDINGS: 1003 hours. Mild patient rotation to the right and lower lung volumes. Right IJ Port-A-Cath extends to the level of the superior cavoatrial junction. Interval mild enlargement of the heart which appears mildly enlarged. There is vascular congestion  without overt pulmonary edema, confluent airspace opacity, pneumothorax or significant pleural effusion. No acute osseous findings are evident. Telemetry leads overlie the chest. IMPRESSION: Cardiomegaly with mild vascular congestion. No overt edema or focal airspace disease demonstrated. Electronically Signed   By: Richardean Sale M.D.   On: 04/23/2022 10:08    Microbiology: Results for orders placed or performed during the hospital encounter of 04/23/22  Urine Culture     Status: Abnormal   Collection Time: 04/23/22  1:45 AM   Specimen: In/Out Cath Urine  Result Value Ref Range Status   Specimen Description   Final    IN/OUT CATH URINE Performed at [facility], [address], [place], [place] [address]    Special Requests   Final    NONE Performed at [facility], [address] [place], [place] [address]    Culture MULTIPLE SPECIES PRESENT, SUGGEST RECOLLECTION (A)  Final   Report Status 04/25/2022 FINAL  Final  Blood Culture (routine x 2)     Status: None   Collection Time: 04/23/22  7:01 AM   Specimen: BLOOD  Result Value Ref Range Status   Specimen Description BLOOD RIGHT ANTECUBITAL  Final   Special Requests   Final    BOTTLES DRAWN AEROBIC AND ANAEROBIC Blood Culture adequate volume   Culture   Final    NO GROWTH 5 DAYS Performed at [facility], [address], [place], [place] [address]    Report Status 04/28/2022 FINAL  Final  Resp Panel by RT-PCR (Flu A&B, Covid) Anterior Nasal Swab     Status: None   Collection Time: 04/23/22  9:57 AM   Specimen: Anterior Nasal Swab  Result Value Ref Range Status   SARS Coronavirus 2 by RT PCR NEGATIVE NEGATIVE Final    Comment: (NOTE) SARS-CoV-2 target nucleic acids are NOT DETECTED.  The SARS-CoV-2 RNA is generally detectable in upper respiratory specimens during the acute phase of infection. The lowest concentration of SARS-CoV-2 viral copies this assay can detect is 138 copies/mL. A negative  result does not preclude SARS-Cov-2 infection and should not be used as the sole basis for treatment or other patient management decisions. A negative result may occur with  improper specimen collection/handling, submission of specimen other than nasopharyngeal swab, presence of viral mutation(s) within the areas targeted by this assay, and inadequate number of viral copies(<138 copies/mL). A negative result must be combined with clinical observations, patient history, and epidemiological information. The expected result is Negative.  Fact Sheet for Patients:  EntrepreneurPulse.com.au  Fact Sheet for Healthcare Providers:  IncredibleEmployment.be  This test is no t yet approved or cleared by the [place] FDA and  has been authorized for detection and/or diagnosis of SARS-CoV-2 by FDA under an Emergency Use Authorization (EUA). This EUA will remain  in effect (  meaning this test can be used) for the duration of the COVID-19 declaration under Section 564(b)(1) of the Act, 21 U.S.C.section 360bbb-3(b)(1), unless the authorization is terminated  or revoked sooner.       Influenza A by PCR NEGATIVE NEGATIVE Final   Influenza B by PCR NEGATIVE NEGATIVE Final    Comment: (NOTE) The Xpert Xpress SARS-CoV-2/FLU/RSV plus assay is intended as an aid in the diagnosis of influenza from Nasopharyngeal swab specimens and should not be used as a sole basis for treatment. Nasal washings and aspirates are unacceptable for Xpert Xpress SARS-CoV-2/FLU/RSV testing.  Fact Sheet for Patients: EntrepreneurPulse.com.au  Fact Sheet for Healthcare Providers: IncredibleEmployment.be  This test is not yet approved or cleared by the [place] FDA and has been authorized for detection and/or diagnosis of SARS-CoV-2 by FDA under an Emergency Use Authorization (EUA). This EUA will remain in effect (meaning this test can be used)  for the duration of the COVID-19 declaration under Section 564(b)(1) of the Act, 21 U.S.C. section 360bbb-3(b)(1), unless the authorization is terminated or revoked.  Performed at [facility], [address] [place], [place] [address]   Blood Culture (routine x 2)     Status: None   Collection Time: 04/23/22 10:00 AM   Specimen: BLOOD  Result Value Ref Range Status   Specimen Description BLOOD BLOOD RIGHT WRIST  Final   Special Requests   Final    BOTTLES DRAWN AEROBIC AND ANAEROBIC Blood Culture adequate volume   Culture   Final    NO GROWTH 5 DAYS Performed at [facility], [address] [place], [place] [address]    Report Status 04/28/2022 FINAL  Final    Labs: CBC: Recent Labs  Lab 04/23/22 0957 04/24/22 0403 04/25/22 0336 04/26/22 0656 04/27/22 0633 04/28/22 0649  WBC 9.9 15.6* 12.8* 10.5 8.8 10.1  NEUTROABS 8.6*  --   --   --   --   --   HGB 9.7* 11.1* 10.2* 11.5* 11.9* 12.2  HCT 32.2* 38.1 32.6* 36.9 37.7 38.0  MCV 97.9 100.3* 94.8 92.0 91.5 91.3  PLT 128* 143* 100* 117* 125* 789*   Basic Metabolic Panel: Recent Labs  Lab 04/24/22 0403 04/25/22 0336 04/26/22 0656 04/27/22 0633 04/28/22 0649  NA 144 146* 144 146* 144  K 4.0 3.3* 2.9* 3.1* 3.5  CL 111 106 99 102 104  CO2 25 31 36* 34* 31  GLUCOSE 222* 175* 192* 168* 196*  BUN 33* 35* 27* 30* 26*  CREATININE 1.71* 1.55* 1.17* 1.42* 1.32*  CALCIUM 8.4* 9.2 9.5 9.5 9.9  MG  --   --   --  1.4* 2.0   Liver Function Tests: Recent Labs  Lab 04/23/22 0957  AST 16  ALT 13  ALKPHOS 47  BILITOT 0.8  PROT 5.6*  ALBUMIN 3.2*   CBG: Recent Labs  Lab 04/27/22 1228 04/27/22 1624 04/27/22 2059 04/28/22 0718 04/28/22 1132  GLUCAP 198* 180* 151* 191* 304*    Discharge time spent: greater than 30 minutes.  Signed: Loletha Grayer, MD [facility] 04/28/2022

## 2022-04-28 NOTE — TOC Transition Note (Signed)
Transition of Care [facility]) - CM/SW Discharge Note   Patient Details  Name: Sierra [name] MRN: [id number] Date of Birth: Feb 28, 1936  [facility]) CM/SW Contact:  Alberteen Sam, LCSW Phone Number: 04/28/2022, 2:26 PM   Clinical Narrative:     Patient will DC to: [facility] Anticipated DC date: 04/28/22 Family notified: daughter Betsy lvm Transport by: Johnanna Schneiders  Per MD patient ready for DC to [facility] . RN, patient, patient's family, and facility notified of DC. Discharge Summary sent to facility. RN given number for report  [phone] Room 205. DC packet on chart. Ambulance transport requested for patient.  CSW signing off.  Pricilla Riffle, LCSW    Final next level of care: Skilled Nursing Facility Barriers to Discharge: No Barriers Identified   Patient Goals and CMS Choice Patient states their goals for this hospitalization and ongoing recovery are:: to go home CMS Medicare.gov Compare Post Acute Care list provided to:: Patient Choice offered to / list presented to : Patient  Discharge Placement                    Patient and family notified of of transfer: 04/28/22  Discharge Plan and Services                                     Social Determinants of Health (SDOH) Interventions     Readmission Risk Interventions     No data to display

## 2022-04-28 NOTE — Assessment & Plan Note (Signed)
Patient able to answer some questions but forgetful and other answers.  Patient's daughter is okay with getting her back into her familiar environment where mental status hopefully will get back to her baseline.

## 2022-04-28 NOTE — NC FL2 (Signed)
[place] LEVEL OF CARE SCREENING TOOL     IDENTIFICATION  Patient Name: Sierra [name] Birthdate: 1936/02/27 Sex: female Admission Date (Current Location): 04/23/2022  [facility] and [place] Number:  Engineering geologist and Address:  [facility], [address], [place], [place] [address]      Provider Number: [id number]  Attending Physician Name and Address:  Loletha Grayer, MD  Relative Name and Phone Number:  [name] (daughter) [phone]    Current Level of Care: Hospital Recommended Level of Care: [place] Prior Approval Number:    Date Approved/Denied:   PASRR Number: [id number]  Discharge Plan: SNF    Current Diagnoses: Patient Active Problem List   Diagnosis Date Noted   Delirium 04/28/2022   Acute respiratory failure with hypoxia and hypercapnia ([place]) 04/23/2022   AKI (acute kidney injury) ([place]) 04/23/2022   Diabetes mellitus without complication ([place]) 53/66/4403   HLD (hyperlipidemia) 01/17/2022   Chronic kidney disease, stage 3a ([place]) 01/17/2022   Ovarian cancer ([place]) 01/17/2022   Chronic diastolic CHF (congestive heart failure) ([place]) 01/17/2022   Chronic venous insufficiency 02/09/2020   Lymphedema 02/09/2020   Cardiac pacemaker 10/01/2019   Syncope 09/21/2019   Diabetes mellitus, controlled ([place]) 09/21/2019   Complete heart block ([place]) 09/18/2019   Sepsis ([place]) 08/08/2019   UTI (urinary tract infection) 08/08/2019   Hypothyroidism 08/08/2019   HTN (hypertension) 08/08/2019   Diabetes ([place]) 08/08/2019   Protein C deficiency ([place]) 08/08/2019   Protein S deficiency ([place]) 08/08/2019   Left leg pain 08/06/2019   Hip fracture ([place]) 01/11/2019   Lumbar radiculopathy 09/04/2018   Abnormal EKG 08/14/2018   Pre-op evaluation 08/14/2018   Anticoagulant long-term use 01/24/2018   Poorly-controlled hypertension 01/24/2018   Osteoporosis with current pathological fracture 09/16/2017   Post-menopausal  osteoporosis 09/16/2017   CKD (chronic kidney disease) stage 2, GFR 60-89 ml/min 07/17/2017   Closed compression fracture of L1 vertebra ([place]) 10/22/2016   Closed fracture of left scapula 10/22/2016   Closed fracture of sacrum ([place]) 10/22/2016   Closed fracture of sternum 10/22/2016   Multiple rib fractures 10/22/2016   Trimalleolar fracture of ankle, closed, left, with routine healing, subsequent encounter 03/11/2015   Deep vein thrombosis (DVT) of right lower extremity ([name]) 11/19/2014   Irritable bowel syndrome with diarrhea 11/19/2014   Closed fracture of phalanx of foot 05/15/2013   Knee injury 01/11/2012   Osteopenia 09/18/2011   Hypercalcemia 04/12/2011   Acute thromboembolism of deep veins of lower extremity ([place]) 02/20/2010   Diabetic nephropathy ([place]) 02/20/2010   Vitamin D deficiency 02/20/2010    Orientation RESPIRATION BLADDER Height & Weight     Self, Situation  O2 (2L nasal cannula prn) Incontinent, External catheter Weight: 195 lb (88.5 kg) Height:  '5\' 7"'$  (170.2 cm)  BEHAVIORAL SYMPTOMS/MOOD NEUROLOGICAL BOWEL NUTRITION STATUS      Incontinent Diet (see dischaarge summary)  AMBULATORY STATUS COMMUNICATION OF NEEDS Skin   Extensive Assist Verbally Normal                       Personal Care Assistance Level of Assistance  Bathing, Feeding, Dressing, Total care Bathing Assistance: Maximum assistance Feeding assistance: Limited assistance Dressing Assistance: Maximum assistance Total Care Assistance: Maximum assistance   Functional Limitations Info  Sight, Hearing, Speech Sight Info: Adequate Hearing Info: Adequate Speech Info: Adequate    SPECIAL CARE FACTORS FREQUENCY  Contractures Contractures Info: Not present    Additional Factors Info  Code Status, Allergies Code Status Info: dnr Allergies Info: gentamicin, morphin and related           Current Medications (04/28/2022):  This is the current hospital active  medication list Current Facility-Administered Medications  Medication Dose Route Frequency Provider Last Rate Last Admin   acetaminophen (TYLENOL) tablet 650 mg  650 mg Oral Q6H PRN Cox, Amy N, DO       Or   acetaminophen (TYLENOL) suppository 650 mg  650 mg Rectal Q6H PRN Cox, Amy N, DO       apixaban (ELIQUIS) tablet 2.5 mg  2.5 mg Oral BID Cox, Amy N, DO   2.5 mg at 04/28/22 0843   atorvastatin (LIPITOR) tablet 40 mg  40 mg Oral QHS Cox, Amy N, DO   40 mg at 04/27/22 2109   Chlorhexidine Gluconate Cloth 2 % PADS 6 each  6 each Topical Daily Wyvonnia Dusky, MD   6 each at 04/28/22 0847   DULoxetine (CYMBALTA) DR capsule 30 mg  30 mg Oral Daily Cox, Amy N, DO   30 mg at 04/28/22 0843   famotidine (PEPCID) tablet 10 mg  10 mg Oral QHS Cox, Amy N, DO   10 mg at 04/27/22 2108   hydrALAZINE (APRESOLINE) injection 20 mg  20 mg Intravenous Q6H PRN Wyvonnia Dusky, MD   20 mg at 04/28/22 0428   insulin aspart (novoLOG) injection 0-5 Units  0-5 Units Subcutaneous QHS Cox, Amy N, DO   2 Units at 04/27/22 1027   insulin aspart (novoLOG) injection 0-9 Units  0-9 Units Subcutaneous TID WC Cox, Amy N, DO   2 Units at 04/28/22 0843   levothyroxine (SYNTHROID) tablet 75 mcg  75 mcg Oral QAC breakfast Cox, Amy N, DO   75 mcg at 04/28/22 0607   lisinopril (ZESTRIL) tablet 10 mg  10 mg Oral Daily Wyvonnia Dusky, MD   10 mg at 04/28/22 0843   melatonin tablet 2.5 mg  2.5 mg Oral QHS Renda Rolls, RPH   2.5 mg at 04/27/22 2109   mesalamine (LIALDA) EC tablet 2.4 g  2.4 g Oral Q breakfast Madueme, Elvira C, RPH   2.4 g at 04/28/22 0842   ondansetron (ZOFRAN) tablet 4 mg  4 mg Oral Q6H PRN Cox, Amy N, DO       Or   ondansetron (ZOFRAN) injection 4 mg  4 mg Intravenous Q6H PRN Cox, Amy N, DO       Oral care mouth rinse  15 mL Mouth Rinse 4 times per day Wyvonnia Dusky, MD   15 mL at 04/28/22 1220   Oral care mouth rinse  15 mL Mouth Rinse PRN Wyvonnia Dusky, MD       senna-docusate  (Senokot-S) tablet 1 tablet  1 tablet Oral QHS PRN Cox, Amy N, DO       thiamine tablet 100 mg  100 mg Oral Daily Cox, Amy N, DO   100 mg at 04/28/22 3559   vitamin B-12 (CYANOCOBALAMIN) tablet 1,000 mcg  1,000 mcg Oral Daily Cox, Amy N, DO   1,000 mcg at 04/28/22 7416     Discharge Medications: Please see discharge summary for a list of discharge medications.  Relevant Imaging Results:  Relevant Lab Results:   Additional Information SSN:[id number]  Alberteen Sam, LCSW

## 2022-04-28 NOTE — Care Management Important Message (Signed)
Important Message  Patient Details  Name: Sierra [name] MRN: [id number] Date of Birth: 1936-02-23   Medicare Important Message Given:  Yes     Juliann Pulse A Zain Lankford 04/28/2022, 3:17 PM

## 2022-04-29 DIAGNOSIS — F39 Unspecified mood [affective] disorder: Secondary | ICD-10-CM

## 2022-04-29 DIAGNOSIS — J9601 Acute respiratory failure with hypoxia: Secondary | ICD-10-CM | POA: Diagnosis not present

## 2022-04-29 DIAGNOSIS — I10 Essential (primary) hypertension: Secondary | ICD-10-CM

## 2022-04-29 DIAGNOSIS — R410 Disorientation, unspecified: Secondary | ICD-10-CM | POA: Diagnosis not present

## 2022-04-29 DIAGNOSIS — G620 Drug-induced polyneuropathy: Secondary | ICD-10-CM

## 2022-04-29 DIAGNOSIS — E119 Type 2 diabetes mellitus without complications: Secondary | ICD-10-CM | POA: Diagnosis not present

## 2022-04-29 DIAGNOSIS — N1832 Chronic kidney disease, stage 3b: Secondary | ICD-10-CM

## 2022-04-29 DIAGNOSIS — E1121 Type 2 diabetes mellitus with diabetic nephropathy: Secondary | ICD-10-CM | POA: Diagnosis not present

## 2022-04-29 LAB — COMPREHENSIVE METABOLIC PANEL
Albumin: 3.6 (ref 3.5–5.0)
Calcium: 9.9 (ref 8.7–10.7)
Globulin: 2.4
eGFR: 28

## 2022-04-29 LAB — CBC AND DIFFERENTIAL
HCT: 35 — AB (ref 36–46)
Neutrophils Absolute: 7421
Platelets: 128 10*3/uL — AB (ref 150–400)
WBC: 9.7

## 2022-04-29 LAB — BASIC METABOLIC PANEL
BUN: 28 — AB (ref 4–21)
CO2: 321 — AB (ref 13–22)
Chloride: 104 (ref 99–108)
Creatinine: 1.8 — AB (ref 0.5–1.1)
Glucose: 147
Potassium: 3.7 mEq/L (ref 3.5–5.1)
Sodium: 143 (ref 137–147)

## 2022-04-29 LAB — HEPATIC FUNCTION PANEL
ALT: 10 U/L (ref 7–35)
AST: 12 — AB (ref 13–35)
Alkaline Phosphatase: 60 (ref 25–125)
Bilirubin, Total: 0.8

## 2022-04-29 LAB — CBC: RBC: 3.84 — AB (ref 3.87–5.11)

## 2022-05-05 DIAGNOSIS — R82998 Other abnormal findings in urine: Secondary | ICD-10-CM

## 2022-05-05 DIAGNOSIS — G9332 Myalgic encephalomyelitis/chronic fatigue syndrome: Secondary | ICD-10-CM

## 2022-05-05 DIAGNOSIS — R6889 Other general symptoms and signs: Secondary | ICD-10-CM

## 2022-05-06 LAB — CBC AND DIFFERENTIAL
HCT: 33 — AB (ref 36–46)
Hemoglobin: 10.5 — AB (ref 12.0–16.0)
Neutrophils Absolute: 6464
Platelets: 180 10*3/uL (ref 150–400)
WBC: 8.1

## 2022-05-06 LAB — BASIC METABOLIC PANEL
BUN: 23 — AB (ref 4–21)
CO2: 25 — AB (ref 13–22)
Chloride: 108 (ref 99–108)
Creatinine: 1.6 — AB (ref 0.5–1.1)
Glucose: 147
Potassium: 4.4 mEq/L (ref 3.5–5.1)
Sodium: 139 (ref 137–147)

## 2022-05-06 LAB — COMPREHENSIVE METABOLIC PANEL
Albumin: 3.7 (ref 3.5–5.0)
Calcium: 9.2 (ref 8.7–10.7)
Globulin: 2.1
eGFR: 31

## 2022-05-06 LAB — HEPATIC FUNCTION PANEL
ALT: 10 U/L (ref 7–35)
AST: 13 (ref 13–35)
Alkaline Phosphatase: 54 (ref 25–125)

## 2022-05-06 LAB — CBC: RBC: 3.61 — AB (ref 3.87–5.11)

## 2022-05-13 LAB — BASIC METABOLIC PANEL
BUN: 20 (ref 4–21)
BUN: 28 — AB (ref 4–21)
CO2: 25 — AB (ref 13–22)
CO2: 26 — AB (ref 13–22)
Chloride: 104 (ref 99–108)
Chloride: 106 (ref 99–108)
Creatinine: 1.5 — AB (ref 0.5–1.1)
Creatinine: 1.7 — AB (ref 0.5–1.1)
Glucose: 142
Glucose: 166
Potassium: 3.7 mEq/L (ref 3.5–5.1)
Potassium: 4.1 mEq/L (ref 3.5–5.1)
Sodium: 137 (ref 137–147)
Sodium: 139 (ref 137–147)

## 2022-05-13 LAB — HEPATIC FUNCTION PANEL
ALT: 8 U/L (ref 7–35)
AST: 13 (ref 13–35)
Alkaline Phosphatase: 61 (ref 25–125)
Bilirubin, Total: 0.6

## 2022-05-13 LAB — COMPREHENSIVE METABOLIC PANEL
Albumin: 3.7 (ref 3.5–5.0)
Albumin: 3.7 (ref 3.5–5.0)
Calcium: 8.8 (ref 8.7–10.7)
Calcium: 9.5 (ref 8.7–10.7)
Globulin: 2.3
eGFR: 30
eGFR: 34

## 2022-05-13 LAB — CBC AND DIFFERENTIAL
HCT: 34 — AB (ref 36–46)
Hemoglobin: 10.9 — AB (ref 12.0–16.0)
Neutrophils Absolute: 4934
Platelets: 168 10*3/uL (ref 150–400)
WBC: 6.4

## 2022-05-13 LAB — CBC: RBC: 3.73 — AB (ref 3.87–5.11)

## 2022-05-17 NOTE — Progress Notes (Deleted)
   Patient ID: Sierra [name], female    DOB: [birth year], 86 y.o.   MRN: [id number]  HPI  Sierra [name] is a 86 y/o female with a history of  Echo report from 01/17/22 reviewed and showed an EF of >75% along with mild LVH/LAE and possible severe MR, although image is poor quality.   Admitted 04/23/22 due to altered mental status with hypoxia. Found to be hypotensive. Initially placed on bipap but then weaned off to baseline oxygen. Head CT X 2 were both negative. Unable to do MRI due to leadless pacemaker. Antibiotics given due to sepsis. Discharged after 5 days.   She presents for her initial visit with a chief complaint of      Review of Systems    Physical Exam    Assessment and Plan:  1: Chronic heart failure with preserved ejection fraction with structural changes (LVH/LAE)- - NYHA class - BNP 04/23/22 was 311.9  2: HTN- - BP - BMP 04/28/22 reviewed and showed sodium 144, potassium 3.5, creatinine 1.32 and GFR 40  3: DM- - saw endocrinology (Sierra [name]) 10/20/21 - A1c 01/18/22 was 6.7%  4: Ovarian cancer- - chemo has recently been stopped - saw GYN Sierra [name]) 03/29/22

## 2022-05-18 ENCOUNTER — Ambulatory Visit: Payer: Medicare Other | Admitting: Family

## 2022-05-26 DIAGNOSIS — B351 Tinea unguium: Secondary | ICD-10-CM | POA: Diagnosis not present

## 2022-06-06 NOTE — Progress Notes (Addendum)
MRN : [id number]  Sierra [name] is a 86 y.o. (December 14, 1935) female who presents with chief complaint of legs swell.  History of Present Illness:   The patient presents to the office for evaluation of increasing leg pain and swelling.  She has a history of DVT remotely and it is associated with hypercoagulable state.  The patient notes the affected leg continues to be painful with dependency and swells with dependency.  Symptoms are much better with elevation.  The patient notes minimal edema in the morning which steadily worsens throughout the day.    The patient has not been using compression therapy at this point.  The patient is on anticoagulation.  No SOB or pleuritic chest pains.  No cough or hemoptysis.  No blood per rectum or blood in any sputum.  No excessive bruising per the patient.   No recent shortening of the patient's walking distance or new symptoms consistent with claudication.  No history of rest pain symptoms. No new ulcers or wounds of the lower extremities have occurred.  The patient denies amaurosis fugax or recent TIA symptoms. There are no recent neurological changes noted. No recent episodes of angina or shortness of breath documented.   Addendum: Patient measurements:  Location 06/24/2022 07/22/2022 Left Ankle       37.1 cm 37.5 cm Right Ankle 31.6 cm 32.0 cm Left Calf 45.6 cm 45.9 cm Right Calf 36.3 cm  36.9 cm    No outpatient medications have been marked as taking for the 06/07/22 encounter (Appointment) with Delana Meyer Dolores Lory, MD.    Past Medical History:  Diagnosis Date   Anginal pain ([place])    Anxiety    Diabetes mellitus without complication ([place])    Hypertension    Hypothyroidism    Protein C deficiency ([place])    Protein S deficiency ([place])     Past Surgical History:  Procedure Laterality Date   ABDOMINAL SURGERY     BACK SURGERY     BREAST EXCISIONAL BIOPSY Left 2003?   benign   COLONOSCOPY N/A 09/12/2017   Procedure: COLONOSCOPY;   Surgeon: Lollie Sails, MD;  Location: [facility] ENDOSCOPY;  Service: Endoscopy;  Laterality: N/A;   COLONOSCOPY WITH PROPOFOL N/A 01/20/2016   Procedure: COLONOSCOPY WITH PROPOFOL;  Surgeon: Lollie Sails, MD;  Location: [facility] ENDOSCOPY;  Service: Endoscopy;  Laterality: N/A;   FRACTURE SURGERY     HIP ARTHROPLASTY Left 01/12/2019   Procedure: ARTHROPLASTY BIPOLAR HIP (HEMIARTHROPLASTY), LEFT;  Surgeon: Leim Fabry, MD;  Location: [facility] ORS;  Service: Orthopedics;  Laterality: Left;   PACEMAKER LEADLESS INSERTION N/A 09/20/2019   Procedure: PACEMAKER LEADLESS INSERTION;  Surgeon: Isaias Cowman, MD;  Location: [place] CV LAB;  Service: Cardiovascular;  Laterality: N/A;   PERIPHERAL VASCULAR CATHETERIZATION N/A 05/13/2015   Procedure: IVC Filter Removal;  Surgeon: Katha Cabal, MD;  Location: [place] CV LAB;  Service: Cardiovascular;  Laterality: N/A;   TEMPORARY PACEMAKER Right 09/20/2019   Procedure: TEMPORARY PACEMAKER;  Surgeon: Isaias Cowman, MD;  Location: [place] CV LAB;  Service: Cardiovascular;  Laterality: Right;   TONSILLECTOMY      Social History Social History   Tobacco Use   Smoking status: Former   Smokeless tobacco: Never  Substance Use Topics   Alcohol use: No   Drug use: No    Family History Family History  Problem Relation Age of Onset   Breast cancer Sister     Allergies  Allergen Reactions   Gentamicin Other (  See Comments)    Kidney function   Morphine And Related Other (See Comments)    AMS - agitation and delusions **No legal documents to be signed if taking, per family**     REVIEW OF SYSTEMS (Negative unless checked)  Constitutional: '[]'$ Weight loss  '[]'$ Fever  '[]'$ Chills Cardiac: '[]'$ Chest pain   '[]'$ Chest pressure   '[]'$ Palpitations   '[]'$ Shortness of breath when laying flat   '[]'$ Shortness of breath with exertion. Vascular:  '[]'$ Pain in legs with walking   '[x]'$ Pain in legs with standing  '[]'$ History of DVT   '[]'$ Phlebitis    '[x]'$ Swelling in legs   '[]'$ Varicose veins   '[]'$ Non-healing ulcers Pulmonary:   '[]'$ Uses home oxygen   '[]'$ Productive cough   '[]'$ Hemoptysis   '[]'$ Wheeze  '[]'$ COPD   '[]'$ Asthma Neurologic:  '[]'$ Dizziness   '[]'$ Seizures   '[]'$ History of stroke   '[]'$ History of TIA  '[]'$ Aphasia   '[]'$ Vissual changes   '[]'$ Weakness or numbness in arm   '[]'$ Weakness or numbness in leg Musculoskeletal:   '[]'$ Joint swelling   '[]'$ Joint pain   '[]'$ Low back pain Hematologic:  '[]'$ Easy bruising  '[]'$ Easy bleeding   '[]'$ Hypercoagulable state   '[]'$ Anemic Gastrointestinal:  '[]'$ Diarrhea   '[]'$ Vomiting  '[]'$ Gastroesophageal reflux/heartburn   '[]'$ Difficulty swallowing. Genitourinary:  '[]'$ Chronic kidney disease   '[]'$ Difficult urination  '[]'$ Frequent urination   '[]'$ Blood in urine Skin:  '[]'$ Rashes   '[]'$ Ulcers  Psychological:  '[]'$ History of anxiety   '[]'$  History of major depression.  Physical Examination  There were no vitals filed for this visit. There is no height or weight on file to calculate BMI. Gen: WD/WN, NAD Head: [place]/AT, No temporalis wasting.  Ear/Nose/Throat: Hearing grossly intact, nares w/o erythema or drainage, pinna without lesions Eyes: PER, EOMI, sclera nonicteric.  Neck: Supple, no gross masses.  No JVD.  Pulmonary:  Good air movement, no audible wheezing, no use of accessory muscles.  Cardiac: RRR, precordium not hyperdynamic. Vascular:  scattered varicosities present bilaterally.  Moderate venous stasis changes to the legs bilaterally.  3-4+ soft pitting edema  Vessel Right Left  Radial Palpable Palpable  Gastrointestinal: soft, non-distended. No guarding/no peritoneal signs.  Musculoskeletal: M/S 5/5 throughout.  No deformity.  Neurologic: CN 2-12 intact. Pain and light touch intact in extremities.  Symmetrical.  Speech is fluent. Motor exam as listed above. Psychiatric: Judgment intact, Mood & affect appropriate for pt's clinical situation. Dermatologic: Venous rashes no ulcers noted.  No changes consistent with cellulitis. Lymph : No lichenification or skin  changes of chronic lymphedema.  CBC Lab Results  Component Value Date   WBC 10.1 04/28/2022   HGB 12.2 04/28/2022   HCT 38.0 04/28/2022   MCV 91.3 04/28/2022   PLT 118 (L) 04/28/2022    BMET    Component Value Date/Time   NA 144 04/28/2022 0649   NA 140 03/03/2015 0600   K 3.5 04/28/2022 0649   K 3.3 (L) 03/03/2015 0600   CL 104 04/28/2022 0649   CL 101 03/03/2015 0600   CO2 31 04/28/2022 0649   CO2 32 03/03/2015 0600   GLUCOSE 196 (H) 04/28/2022 0649   GLUCOSE 165 (H) 03/03/2015 0600   BUN 26 (H) 04/28/2022 0649   BUN 13 03/03/2015 0600   CREATININE 1.32 (H) 04/28/2022 0649   CREATININE 0.67 03/03/2015 0600   CALCIUM 9.9 04/28/2022 0649   CALCIUM 9.0 03/03/2015 0600   GFRNONAA 40 (L) 04/28/2022 0649   GFRNONAA >60 03/03/2015 0600   GFRAA 40 (L) 06/15/2020 1541   GFRAA >60 03/03/2015 0600   CrCl cannot be  calculated (Patient's most recent lab result is older than the maximum 21 days allowed.).  COAG Lab Results  Component Value Date   INR 1.3 (H) 04/24/2022   INR 1.2 04/23/2022   INR 1.2 01/17/2022    Radiology No results found.   Assessment/Plan 1. Chronic deep vein thrombosis (DVT) of right lower extremity, unspecified vein ([name]) Recommend:   No surgery or intervention at this point in time.  IVC filter is not indicated at present.  The patient continues on anticoagulation   Elevation was stressed, such as the use of a recliner.  I have discussed  DVT and post phlebitic changes such as swelling and why it  causes symptoms such as pain.  The patient should wear graduated compression stockings beginning after three full days of anticoagulation.  The compression should be worn on a daily basis. The patient should wearing the stockings first thing in the morning and removing them in the evening. The patient should not to sleep in the stockings.  In addition, behavioral modification including elevation during the day and avoidance of prolonged dependency will  be initiated.    Addendum: Patient measurements:  Location 06/24/2022 07/22/2022 Left Ankle       37.1 cm 37.5 cm Right Ankle 31.6 cm 32.0 cm Left Calf 45.6 cm 45.9 cm Right Calf 36.3 cm  36.9 cm  The patient will continue anticoagulation for now as there have not been any problems or complications at this point.    2. Protein S deficiency ([place]) Recommend:   No surgery or intervention at this point in time.  IVC filter is not indicated at present.  The patient continues on anticoagulation   Elevation was stressed, such as the use of a recliner.  I have discussed  DVT and post phlebitic changes such as swelling and why it  causes symptoms such as pain.  The patient should wear graduated compression stockings beginning after three full days of anticoagulation.  The compression should be worn on a daily basis. The patient should wearing the stockings first thing in the morning and removing them in the evening. The patient should not to sleep in the stockings.  In addition, behavioral modification including elevation during the day and avoidance of prolonged dependency will be initiated.    The patient will continue anticoagulation for now as there have not been any problems or complications at this point.    3. Lymphedema Recommend:  No surgery or intervention at this point in time.    After more than 4 weeks of exercise, elevation and compression the patient's lymphedema and hyperpigmentation has not improved.  I have reviewed my discussion with the patient regarding lymphedema and why it  causes symptoms.  Patient will continue wearing graduated compression on a daily basis. The patient should put the compression on first thing in the morning and removing them in the evening. The patient should not sleep in the compression.   In addition, behavioral modification throughout the day will be continued.  This will include frequent elevation (such as in a recliner), use of over the  counter pain medications as needed and exercise such as walking.  The systemic causes for chronic edema such as liver, kidney and cardiac etiologies do not appear to have significant changed over the past year.    Despite conservative treatments including graduated compression therapy class 1 and behavioral modification including exercise and elevation the patient  has not obtained adequate control of the lymphedema.  The patient still has  stage 3 lymphedema and therefore, I believe that a lymph pump should be added to improve the control of the patient's lymphedema.  Additionally, a lymph pump is warranted because it will reduce the risk of cellulitis and ulceration in the future.  Addendum: Patient measurements:  Location 06/24/2022 07/22/2022 Left Ankle       37.1 cm 37.5 cm Right Ankle 31.6 cm 32.0 cm Left Calf 45.6 cm 45.9 cm Right Calf 36.3 cm  36.9 cm  Patient should follow-up in six months    4. Hyperlipidemia, unspecified hyperlipidemia type Continue statin as ordered and reviewed, no changes at this time   5. Type 2 diabetes mellitus with stage 3b chronic kidney disease, without long-term current use of insulin ([name]) Continue hypoglycemic medications as already ordered, these medications have been reviewed and there are no changes at this time.  Hgb A1C to be monitored as already arranged by primary service     Hortencia Pilar, MD  06/06/2022 2:46 PM

## 2022-06-07 ENCOUNTER — Ambulatory Visit (INDEPENDENT_AMBULATORY_CARE_PROVIDER_SITE_OTHER): Admitting: Vascular Surgery

## 2022-06-07 ENCOUNTER — Encounter (INDEPENDENT_AMBULATORY_CARE_PROVIDER_SITE_OTHER): Payer: Self-pay | Admitting: Vascular Surgery

## 2022-06-07 VITALS — BP 150/92 | HR 67 | Resp 16

## 2022-06-07 DIAGNOSIS — I82501 Chronic embolism and thrombosis of unspecified deep veins of right lower extremity: Secondary | ICD-10-CM

## 2022-06-07 DIAGNOSIS — D6859 Other primary thrombophilia: Secondary | ICD-10-CM | POA: Diagnosis not present

## 2022-06-07 DIAGNOSIS — I890 Lymphedema, not elsewhere classified: Secondary | ICD-10-CM

## 2022-06-07 DIAGNOSIS — N1832 Chronic kidney disease, stage 3b: Secondary | ICD-10-CM

## 2022-06-07 DIAGNOSIS — E785 Hyperlipidemia, unspecified: Secondary | ICD-10-CM

## 2022-06-07 DIAGNOSIS — E1122 Type 2 diabetes mellitus with diabetic chronic kidney disease: Secondary | ICD-10-CM

## 2022-06-16 ENCOUNTER — Encounter (INDEPENDENT_AMBULATORY_CARE_PROVIDER_SITE_OTHER): Payer: Self-pay | Admitting: Vascular Surgery

## 2022-06-18 DIAGNOSIS — F39 Unspecified mood [affective] disorder: Secondary | ICD-10-CM | POA: Diagnosis not present

## 2022-06-18 DIAGNOSIS — I442 Atrioventricular block, complete: Secondary | ICD-10-CM

## 2022-06-18 DIAGNOSIS — K219 Gastro-esophageal reflux disease without esophagitis: Secondary | ICD-10-CM

## 2022-06-18 DIAGNOSIS — E785 Hyperlipidemia, unspecified: Secondary | ICD-10-CM

## 2022-06-18 DIAGNOSIS — E039 Hypothyroidism, unspecified: Secondary | ICD-10-CM

## 2022-06-18 DIAGNOSIS — I10 Essential (primary) hypertension: Secondary | ICD-10-CM | POA: Diagnosis not present

## 2022-06-18 DIAGNOSIS — N183 Chronic kidney disease, stage 3 unspecified: Secondary | ICD-10-CM | POA: Diagnosis not present

## 2022-06-18 DIAGNOSIS — E119 Type 2 diabetes mellitus without complications: Secondary | ICD-10-CM | POA: Diagnosis not present

## 2022-06-18 DIAGNOSIS — M810 Age-related osteoporosis without current pathological fracture: Secondary | ICD-10-CM

## 2022-06-18 DIAGNOSIS — C569 Malignant neoplasm of unspecified ovary: Secondary | ICD-10-CM

## 2022-06-18 DIAGNOSIS — I872 Venous insufficiency (chronic) (peripheral): Secondary | ICD-10-CM

## 2022-07-14 ENCOUNTER — Encounter: Payer: Self-pay | Admitting: Student

## 2022-07-14 ENCOUNTER — Non-Acute Institutional Stay (SKILLED_NURSING_FACILITY): Admitting: Student

## 2022-07-14 DIAGNOSIS — L853 Xerosis cutis: Secondary | ICD-10-CM | POA: Diagnosis not present

## 2022-07-14 DIAGNOSIS — M7918 Myalgia, other site: Secondary | ICD-10-CM | POA: Diagnosis not present

## 2022-07-14 DIAGNOSIS — F5104 Psychophysiologic insomnia: Secondary | ICD-10-CM

## 2022-07-14 NOTE — Progress Notes (Signed)
Location:   [facility] Room Number: 205 Place of Service:  Nursing [phone]) Provider:  Amada Kingfisher, MD  Patient Care Team: Dewayne Shorter, MD as PCP - General [facility])  Extended Emergency Contact Information Primary Emergency Contact: [place], [place] [address] Johnnette Litter of [place] Phone: [phone] Relation: Daughter  Code Status:  DNAR Goals of care: Advanced Directive information    01/17/2022    3:08 PM  Advanced Directives  Would patient like information on creating a medical advance directive? No - Patient declined     Chief Complaint  Patient presents with   Urticaria    HPI:  Pt is a 86 y.o. female seen today for an acute visit for concern for hives. Patient discussed her skin concern with hospice nurse. Explained upon questioning that she does not have any raised areas or rash. She feels itchy especially later at night. She has   She has had trouble sleeping. Takes melatonin 3 mg nightly. She feels like she is thinking a lot about her diagnosis and leaving her family given her terminal diagnosis. Had more issues in the late afternoon and evening. She has itching all over her body. No clear rash. She has had the issue before, and it usually lasts about 2 weeks and then goes away. This time it started about 2 weeks ago. She has a lot on her mind that has led to these changes in her skin. She doesn't use any moisurizers. She has been getting cetaphil one time per day and that seems to help a lot.   She would like cetaphil in her room 2-3x per day of moisturisers.   She thinks she may have pulled a muscle in her groin during her boxing classes. Denies changes in mobility due to pain. She has some pain when she bends and when she turns over at night.  No falls or injury. She doesn't know if she heard a pop with the punching motion during her boxing class. On a rate of 1-10, she would say it is a 5-6.    Past Surgical History:  Procedure Laterality Date   ABDOMINAL SURGERY     BACK SURGERY     BREAST EXCISIONAL BIOPSY Left 2003?   benign   COLONOSCOPY N/A 09/12/2017   Procedure: COLONOSCOPY;  Surgeon: Lollie Sails, MD;  Location: [facility] ENDOSCOPY;  Service: Endoscopy;  Laterality: N/A;   COLONOSCOPY WITH PROPOFOL N/A 01/20/2016   Procedure: COLONOSCOPY WITH PROPOFOL;  Surgeon: Lollie Sails, MD;  Location: [facility] ENDOSCOPY;  Service: Endoscopy;  Laterality: N/A;   FRACTURE SURGERY     HIP ARTHROPLASTY Left 01/12/2019   Procedure: ARTHROPLASTY BIPOLAR HIP (HEMIARTHROPLASTY), LEFT;  Surgeon: Leim Fabry, MD;  Location: [facility] ORS;  Service: Orthopedics;  Laterality: Left;   PACEMAKER LEADLESS INSERTION N/A 09/20/2019   Procedure: PACEMAKER LEADLESS INSERTION;  Surgeon: Isaias Cowman, MD;  Location: [place] CV LAB;  Service: Cardiovascular;  Laterality: N/A;   PERIPHERAL VASCULAR CATHETERIZATION N/A 05/13/2015   Procedure: IVC Filter Removal;  Surgeon: Katha Cabal, MD;  Location: [place] CV LAB;  Service: Cardiovascular;  Laterality: N/A;   TEMPORARY PACEMAKER Right 09/20/2019   Procedure: TEMPORARY PACEMAKER;  Surgeon: Isaias Cowman, MD;  Location: [place] CV LAB;  Service: Cardiovascular;  Laterality: Right;   TONSILLECTOMY      Allergies  Allergen Reactions   Gentamicin Other (See Comments)    Kidney function  Morphine And Related Other (See Comments)    AMS - agitation and delusions **No legal documents to be signed if taking, per family**    Outpatient Encounter Medications as of 07/14/2022  Medication Sig   acetaminophen (TYLENOL) 325 MG tablet Take 650 mg by mouth every 6 (six) hours as needed for moderate pain or mild pain (pain).   alendronate (FOSAMAX) 70 MG tablet Take 70 mg by mouth every Sunday.   amoxicillin (AMOXIL) 500 MG tablet Take 2,000 mg by mouth as needed. As needed 1 hour before dental work   apixaban (ELIQUIS) 2.5 MG TABS  tablet Take 2.5 mg by mouth 2 (two) times daily.   APRISO 0.375 g 24 hr capsule Take 1.5 g by mouth daily at 12 noon.   calcium carbonate (TUMS EX) 750 MG chewable tablet Chew 1 tablet by mouth daily.   calcium-vitamin D (OSCAL WITH D) 500-5 MG-MCG tablet Take 1 tablet by mouth 2 (two) times daily.   carbamide peroxide (DEBROX) 6.5 % OTIC solution Place 5 drops into both ears as needed.   cholecalciferol (VITAMIN D3) 25 MCG (1000 UT) tablet Take 2,000 Units by mouth daily. (taken with calcium)   diclofenac Sodium (VOLTAREN) 1 % GEL Apply 2 g topically 3 (three) times daily as needed.   DULoxetine (CYMBALTA) 30 MG capsule Take 60 mg by mouth daily.   famotidine (PEPCID) 10 MG tablet Take 10 mg by mouth at bedtime.   glipiZIDE (GLUCOTROL XL) 2.5 MG 24 hr tablet Take 2.5 mg by mouth daily.   hydrocortisone 2.5 % cream Apply 1 Application topically every 8 (eight) hours as needed.   levothyroxine (SYNTHROID) 75 MCG tablet Take 75 mcg by mouth daily before breakfast.   lisinopril (ZESTRIL) 20 MG tablet Take 20 mg by mouth daily.   melatonin 3 MG TABS tablet Take 3 mg by mouth at bedtime.   mupirocin ointment (BACTROBAN) 2 % Apply topically.   omeprazole (PRILOSEC) 40 MG capsule Take 40 mg by mouth daily.   ondansetron (ZOFRAN) 4 MG tablet Take 4 mg by mouth every 8 (eight) hours as needed for nausea or vomiting.   sennosides-docusate sodium (SENOKOT-S) 8.6-50 MG tablet Take 1 tablet by mouth daily.   sitaGLIPtin (JANUVIA) 25 MG tablet Take 1 tablet by mouth daily.   thiamine 100 MG tablet Take 100 mg by mouth daily.   vitamin B-12 (CYANOCOBALAMIN) 1000 MCG tablet Take 1,000 mcg by mouth daily.   [DISCONTINUED] atorvastatin (LIPITOR) 40 MG tablet Take 40 mg by mouth daily.   No facility-administered encounter medications on file as of 07/14/2022.    Review of Systems  Constitutional:  Negative for fatigue and fever.  Skin:        Itchy skin including her back and she can't reach this area for  moisturizer  Psychiatric/Behavioral:         Anxiety  All other systems reviewed and are negative.   Immunization History  Administered Date(s) Administered   Fluad Quad(high Dose 65+) 08/10/2019   Influenza Split 09/14/2011, 08/31/2012, 08/29/2014, 09/22/2015   Influenza, High Dose Seasonal PF 12/04/2018, 12/04/2018   Influenza,inj,Quad PF,6+ Mos 11/02/2016   Influenza,inj,quad, With Preservative 08/31/2012   Influenza-Unspecified 08/08/2014, 08/29/2014, 09/22/2015, 09/26/2017   Moderna Sars-Covid-2 Vaccination 11/11/2019, 11/22/2019, 12/20/2019, 01/09/2020, 07/11/2020, 09/23/2020   Pneumococcal Polysaccharide-23 06/28/2011   Pneumococcal-Unspecified 06/08/2013   Tdap 11/09/2011, 01/05/2013, 02/13/2020   Pertinent  Health Maintenance Due  Topic Date Due   FOOT EXAM  Never done   OPHTHALMOLOGY EXAM  Never  done   DEXA SCAN  Never done   INFLUENZA VACCINE  06/08/2022   HEMOGLOBIN A1C  07/21/2022      04/25/2022    7:36 PM 04/26/2022    8:58 PM 04/27/2022   10:26 AM 04/27/2022   10:34 PM 04/28/2022    8:43 AM  Fall Risk  Patient Fall Risk Level High fall risk High fall risk High fall risk High fall risk High fall risk   Functional Status Survey:    Vitals:   07/14/22 0834  BP: 127/73  Pulse: 78  Resp: 18  Temp: (!) 97.2 F (36.2 C)  SpO2: 96%  Weight: 178 lb 12.8 oz (81.1 kg)   Body mass index is 28 kg/m. Physical Exam Constitutional:      Appearance: She is normal weight.  Cardiovascular:     Pulses: Normal pulses.  Pulmonary:     Effort: Pulmonary effort is normal.  Abdominal:     Comments: Active bowel sounds nontender to palpation in general except for the anterior right flank tenderness to palpation. Tenderness appears to be superficial and doesn't enter the hip or abdominal cavity.  Skin:    General: Skin is warm and dry.     Findings: No lesion or rash.  Neurological:     Mental Status: She is alert.     Labs reviewed: Recent Labs     04/26/22 0656 04/27/22 0633 04/28/22 0649  NA 144 146* 144  K 2.9* 3.1* 3.5  CL 99 102 104  CO2 36* 34* 31  GLUCOSE 192* 168* 196*  BUN 27* 30* 26*  CREATININE 1.17* 1.42* 1.32*  CALCIUM 9.5 9.5 9.9  MG  --  1.4* 2.0   Recent Labs    01/17/22 1022 04/23/22 0957  AST 19 16  ALT 11 13  ALKPHOS 42 47  BILITOT 0.9 0.8  PROT 6.7 5.6*  ALBUMIN 3.7 3.2*   Recent Labs    01/17/22 1022 04/23/22 0957 04/24/22 0403 04/26/22 0656 04/27/22 0633 04/28/22 0649  WBC 7.9 9.9   < > 10.5 8.8 10.1  NEUTROABS 6.7 8.6*  --   --   --   --   HGB 11.5* 9.7*   < > 11.5* 11.9* 12.2  HCT 37.4 32.2*   < > 36.9 37.7 38.0  MCV 94.7 97.9   < > 92.0 91.5 91.3  PLT 124* 128*   < > 117* 125* 118*   < > = values in this interval not displayed.   Lab Results  Component Value Date   TSH 1.148 09/18/2019   Lab Results  Component Value Date   HGBA1C 6.7 (H) 01/18/2022   Lab Results  Component Value Date   CHOL 130 01/18/2022   HDL 41 01/18/2022   LDLCALC 73 01/18/2022   TRIG 82 01/18/2022   CHOLHDL 3.2 01/18/2022    Significant Diagnostic Results in last 30 days:  No results found.  Assessment/Plan  1. Pain in abdominal muscle of flank Patient's symptoms consistent with pulled muscle. Started after exercise class. She hasn't had significant relief but also hasnt asked for pain medications for this. Discussed importance of resting from strenuous exercise. Will encourage use of tylenol, rest, and heating pads ordered. Patient will take a 10-day break from boxing.   2. Dry skin No obvious rash noted on exam today despite patient's chief complaint. Discussed importance of adequate hydration of the skin. Discussed concern for adverse effects of antihistamines such as benadryl, hydroxyzine. NO indication for topical steroids at  this time. Some concern symptoms are heightened with anxiety which is outlined below with insomnia. Cetaphil liberally TID, assistance for application to the back  nightly.   3. Psychophysiological insomnia Patient has trouble falling asleep due to ruminating thoughts of her terminal diagnosis. Discussed patient's current dosing of Cymbalta which she has been on for sometime. She is interested in a trial of increasing dose of Cymbalta from 30 mg to 60 mg daily to see if this may help with her overall anxiety and insomnia. Discussed melatonin and trazodone as well. Will defer starting an additional medication at this time.      Family/ staff Communication: Discussed plan with nursing staff   Labs/tests ordered:  none at this time

## 2022-08-10 ENCOUNTER — Encounter: Payer: Self-pay | Admitting: Nurse Practitioner

## 2022-08-10 ENCOUNTER — Non-Acute Institutional Stay (SKILLED_NURSING_FACILITY): Payer: Medicare Other | Admitting: Nurse Practitioner

## 2022-08-10 DIAGNOSIS — G629 Polyneuropathy, unspecified: Secondary | ICD-10-CM | POA: Diagnosis not present

## 2022-08-10 DIAGNOSIS — M810 Age-related osteoporosis without current pathological fracture: Secondary | ICD-10-CM

## 2022-08-10 DIAGNOSIS — E039 Hypothyroidism, unspecified: Secondary | ICD-10-CM

## 2022-08-10 DIAGNOSIS — D6869 Other thrombophilia: Secondary | ICD-10-CM

## 2022-08-10 DIAGNOSIS — K219 Gastro-esophageal reflux disease without esophagitis: Secondary | ICD-10-CM

## 2022-08-10 DIAGNOSIS — F419 Anxiety disorder, unspecified: Secondary | ICD-10-CM

## 2022-08-10 DIAGNOSIS — N1831 Chronic kidney disease, stage 3a: Secondary | ICD-10-CM | POA: Diagnosis not present

## 2022-08-10 DIAGNOSIS — I10 Essential (primary) hypertension: Secondary | ICD-10-CM

## 2022-08-10 DIAGNOSIS — R600 Localized edema: Secondary | ICD-10-CM

## 2022-08-10 NOTE — Progress Notes (Signed)
Location:   [facility]   Place of Service:   SNF  Dewayne Shorter, MD  Patient Care Team: Dewayne Shorter, MD as PCP - General [facility] Medicine)  Extended Emergency Contact Information Primary Emergency Contact: [place], [place] [address] Johnnette Litter of [place] Phone: [phone] Relation: Daughter  Goals of care: Advanced Directive information    01/17/2022    3:08 PM  Advanced Directives  Would patient like information on creating a medical advance directive? No - Patient declined     Chief Complaint  Patient presents with   Medical Management of Chronic Issues    Routine follow up   Immunizations    Shingrix, pneumoina, flu vaccine  COVID booster due   Quality Metric Gaps    Foot exam ,eye exam, hemoglobin A1C and dexa scan due    HPI:  Pt is a 86 y.o. female seen today for an routine follow up.  Pt with hx of DVT, protein C and S deficiency on eliquis, hyperlipidemia, hypothryoid (managed by endocrine), anxiety, depression, ovary cancer   She has been in skilled care for 2 years. Reports overall doing well.   DM-will need updated a1c, no hypoglycemic episodes  She reports she has been having horrible itching- no rash, raised area or pain.  She was itching for a month but it has started to ease off at this time.   She has hx of ovarian cancer she had surgery to remove but reports you can still see on scan. She is s/p Exploratory laparotomy, bilateral salpingo-oophorectomy, R pelvic and para-aortic lymphadenectomy, omentectomy, peritoneal biopsies, oversew of partial thickness sigmoid colon injury. 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 She denies any abdominal pain.  She has been trying to lose weight.   Her last TSH level on 04/01/2022 was 2.33 uIU/ml.  Reports multiple bms throughout the day that are sticky. Eating a lot of fruits and  vegetables.    Past Medical History:  Diagnosis Date   Anginal pain ([place])    Anxiety    Diabetes mellitus without complication ([place])    Hypertension    Hypothyroidism    Protein C deficiency ([place])    Protein S deficiency ([place])    Past Surgical History:  Procedure Laterality Date   ABDOMINAL SURGERY     BACK SURGERY     BREAST EXCISIONAL BIOPSY Left 2003?   benign   COLONOSCOPY N/A 09/12/2017   Procedure: COLONOSCOPY;  Surgeon: Lollie Sails, MD;  Location: [facility] ENDOSCOPY;  Service: Endoscopy;  Laterality: N/A;   COLONOSCOPY WITH PROPOFOL N/A 01/20/2016   Procedure: COLONOSCOPY WITH PROPOFOL;  Surgeon: Lollie Sails, MD;  Location: [facility] ENDOSCOPY;  Service: Endoscopy;  Laterality: N/A;   FRACTURE SURGERY     HIP ARTHROPLASTY Left 01/12/2019   Procedure: ARTHROPLASTY BIPOLAR HIP (HEMIARTHROPLASTY), LEFT;  Surgeon: Leim Fabry, MD;  Location: [facility] ORS;  Service: Orthopedics;  Laterality: Left;   PACEMAKER LEADLESS INSERTION N/A 09/20/2019   Procedure: PACEMAKER LEADLESS INSERTION;  Surgeon: Isaias Cowman, MD;  Location: [place] CV LAB;  Service: Cardiovascular;  Laterality: N/A;   PERIPHERAL VASCULAR CATHETERIZATION N/A 05/13/2015   Procedure: IVC Filter Removal;  Surgeon: Katha Cabal, MD;  Location: [place] CV LAB;  Service: Cardiovascular;  Laterality: N/A;   TEMPORARY PACEMAKER Right 09/20/2019   Procedure: TEMPORARY PACEMAKER;  Surgeon: Isaias Cowman, MD;  Location:  [place] CV LAB;  Service: Cardiovascular;  Laterality: Right;   TONSILLECTOMY      Allergies  Allergen Reactions   Gentamicin Other (See Comments)    Kidney function   Morphine And Related Other (See Comments)    AMS - agitation and delusions **No legal documents to be signed if taking, per family**    Outpatient Encounter Medications as of 08/10/2022  Medication Sig   acetaminophen (TYLENOL) 325 MG tablet Take 650 mg by mouth every 6 (six) hours as needed for moderate  pain or mild pain (pain).   alendronate (FOSAMAX) 70 MG tablet Take 70 mg by mouth every Sunday.   amoxicillin (AMOXIL) 500 MG tablet Take 2,000 mg by mouth as needed. As needed 1 hour before dental work   apixaban (ELIQUIS) 2.5 MG TABS tablet Take 2.5 mg by mouth 2 (two) times daily.   APRISO 0.375 g 24 hr capsule Take 1.5 g by mouth daily at 12 noon.   calcium carbonate (TUMS EX) 750 MG chewable tablet Chew 1 tablet by mouth daily.   calcium-vitamin D (OSCAL WITH D) 500-5 MG-MCG tablet Take 1 tablet by mouth 2 (two) times daily.   carbamide peroxide (DEBROX) 6.5 % OTIC solution Place 5 drops into both ears as needed.   cholecalciferol (VITAMIN D3) 25 MCG (1000 UT) tablet Take 2,000 Units by mouth daily. (taken with calcium)   diclofenac Sodium (VOLTAREN) 1 % GEL Apply 2 g topically 3 (three) times daily as needed.   DULoxetine (CYMBALTA) 30 MG capsule Take 60 mg by mouth daily.   famotidine (PEPCID) 10 MG tablet Take 10 mg by mouth at bedtime.   glipiZIDE (GLUCOTROL XL) 2.5 MG 24 hr tablet Take 2.5 mg by mouth daily.   hydrocortisone 2.5 % cream Apply 1 Application topically every 8 (eight) hours as needed.   levothyroxine (SYNTHROID) 75 MCG tablet Take 75 mcg by mouth daily before breakfast.   lisinopril (ZESTRIL) 20 MG tablet Take 20 mg by mouth daily.   melatonin 3 MG TABS tablet Take 3 mg by mouth at bedtime.   mupirocin ointment (BACTROBAN) 2 % Apply topically.   omeprazole (PRILOSEC) 40 MG capsule Take 40 mg by mouth daily.   ondansetron (ZOFRAN) 4 MG tablet Take 4 mg by mouth every 8 (eight) hours as needed for nausea or vomiting.   sennosides-docusate sodium (SENOKOT-S) 8.6-50 MG tablet Take 1 tablet by mouth daily.   sitaGLIPtin (JANUVIA) 25 MG tablet Take 1 tablet by mouth daily.   thiamine 100 MG tablet Take 100 mg by mouth daily.   vitamin B-12 (CYANOCOBALAMIN) 1000 MCG tablet Take 1,000 mcg by mouth daily.   No facility-administered encounter medications on file as of  08/10/2022.    Review of Systems  Constitutional:  Negative for activity change, appetite change, fatigue and unexpected weight change.  HENT:  Negative for congestion and hearing loss.   Eyes: Negative.   Respiratory:  Negative for cough and shortness of breath.   Cardiovascular:  Negative for chest pain, palpitations and leg swelling.  Gastrointestinal:  Negative for abdominal pain, constipation and diarrhea.  Genitourinary:  Negative for difficulty urinating and dysuria.  Musculoskeletal:  Negative for arthralgias and myalgias.  Skin:  Negative for color change and wound.  Neurological:  Negative for dizziness and weakness.  Psychiatric/Behavioral:  Negative for agitation, behavioral problems and confusion.     Immunization History  Administered Date(s) Administered   Fluad Quad(high Dose 65+) 08/10/2019   Influenza Split 09/14/2011, 08/31/2012, 08/29/2014, 09/22/2015  Influenza, High Dose Seasonal PF 12/04/2018, 12/04/2018   Influenza,inj,Quad PF,6+ Mos 11/02/2016   Influenza,inj,quad, With Preservative 08/31/2012   Influenza-Unspecified 08/08/2014, 08/29/2014, 09/22/2015, 09/26/2017   Moderna Sars-Covid-2 Vaccination 11/11/2019, 12/20/2019, 01/09/2020, 07/11/2020, 09/23/2020   Pneumococcal Polysaccharide-23 06/28/2011   Pneumococcal-Unspecified 06/08/2013   Tdap 11/09/2011, 01/05/2013, 02/13/2020   Pertinent  Health Maintenance Due  Topic Date Due   FOOT EXAM  Never done   OPHTHALMOLOGY EXAM  Never done   DEXA SCAN  Never done   INFLUENZA VACCINE  06/08/2022   HEMOGLOBIN A1C  07/21/2022      04/25/2022    7:36 PM 04/26/2022    8:58 PM 04/27/2022   10:26 AM 04/27/2022   10:34 PM 04/28/2022    8:43 AM  Fall Risk  Patient Fall Risk Level High fall risk High fall risk High fall risk High fall risk High fall risk   Functional Status Survey:    Vitals:   08/10/22 1506  BP: 125/73  Pulse: 63  Resp: 18  Temp: (!) 97.3 F (36.3 C)  SpO2: 95%  Weight: 178 lb 12.8 oz  (81.1 kg)  Height: '5\' 7"'$  (1.702 m)   Body mass index is 28 kg/m. Physical Exam Constitutional:      General: She is not in acute distress.    Appearance: She is well-developed. She is not diaphoretic.  HENT:     Head: Normocephalic and atraumatic.     Mouth/Throat:     Pharynx: No oropharyngeal exudate.  Eyes:     Conjunctiva/sclera: Conjunctivae normal.     Pupils: Pupils are equal, round, and reactive to light.  Cardiovascular:     Rate and Rhythm: Normal rate and regular rhythm.     Heart sounds: Normal heart sounds.  Pulmonary:     Effort: Pulmonary effort is normal.     Breath sounds: Normal breath sounds.  Abdominal:     General: Bowel sounds are normal.     Palpations: Abdomen is soft.  Musculoskeletal:     Cervical back: Normal range of motion and neck supple.     Right lower leg: No edema.     Left lower leg: No edema.  Skin:    General: Skin is warm and dry.  Neurological:     Mental Status: She is alert.  Psychiatric:        Mood and Affect: Mood normal.     Labs reviewed: Recent Labs    04/26/22 0656 04/27/22 0633 04/28/22 0649  NA 144 146* 144  K 2.9* 3.1* 3.5  CL 99 102 104  CO2 36* 34* 31  GLUCOSE 192* 168* 196*  BUN 27* 30* 26*  CREATININE 1.17* 1.42* 1.32*  CALCIUM 9.5 9.5 9.9  MG  --  1.4* 2.0   Recent Labs    01/17/22 1022 04/23/22 0957  AST 19 16  ALT 11 13  ALKPHOS 42 47  BILITOT 0.9 0.8  PROT 6.7 5.6*  ALBUMIN 3.7 3.2*   Recent Labs    01/17/22 1022 04/23/22 0957 04/24/22 0403 04/26/22 0656 04/27/22 0633 04/28/22 0649  WBC 7.9 9.9   < > 10.5 8.8 10.1  NEUTROABS 6.7 8.6*  --   --   --   --   HGB 11.5* 9.7*   < > 11.5* 11.9* 12.2  HCT 37.4 32.2*   < > 36.9 37.7 38.0  MCV 94.7 97.9   < > 92.0 91.5 91.3  PLT 124* 128*   < > 117* 125* 118*   < > =  values in this interval not displayed.   Lab Results  Component Value Date   TSH 1.148 09/18/2019   Lab Results  Component Value Date   HGBA1C 6.7 (H) 01/18/2022   Lab  Results  Component Value Date   CHOL 130 01/18/2022   HDL 41 01/18/2022   LDLCALC 73 01/18/2022   TRIG 82 01/18/2022   CHOLHDL 3.2 01/18/2022    Significant Diagnostic Results in last 30 days:  No results found.  Assessment/Plan 1. Chronic kidney disease, stage 3a ([name]) -Chronic and stable on last labs Encourage proper hydration Follow metabolic panel Avoid nephrotoxic meds (NSAIDS)  2. Hypothyroidism, unspecified type -followed by endocrinology, last TSH at goal  3. Secondary hypercoagulable state ([place]) Due to protein C and S deficiency, continues on eliquis  4. Neuropathy -stable, continues on cymbalta.   5. Anxiety Controlled on cymbalta.   6. Post-menopausal osteoporosis Reports she has been on fosamax for over 20 years. Will stop at this time -Recommended to take calcium 600 mg twice daily with Vitamin D 2000 units daily and weight bearing activity 30 mins/5 days a week She is wheelchair bound making weight bearing exercises more difficult.   7. Gastroesophageal reflux disease without esophagitis Controlled, will stop famotide at this time and continue omeprazole. Continue dietary modifications.   8. Primary hypertension -Blood pressure well controlled, goal bp <140/90 Continue medications and dietary modifications follow metabolic panel She is requesting to stop lasix- this is reasonable. Will stop at this time and monitor.   9. LE edema.  -resolved, she is requesting for her lasix to be stopped, bp is well controlled and without le edema so lasix stopped.   Carlos American. [place], [facility] [phone]

## 2022-08-12 LAB — COMPREHENSIVE METABOLIC PANEL
Albumin: 3.2 — AB (ref 3.5–5.0)
Calcium: 9.2 (ref 8.7–10.7)
Globulin: 3

## 2022-08-12 LAB — HEMOGLOBIN A1C: Hemoglobin A1C: 7.4

## 2022-08-12 LAB — HEPATIC FUNCTION PANEL
ALT: 11 U/L (ref 7–35)
AST: 24 (ref 13–35)
Alkaline Phosphatase: 88 (ref 25–125)
Bilirubin, Total: 0.5

## 2022-08-12 LAB — CBC AND DIFFERENTIAL
HCT: 34 — AB (ref 36–46)
Hemoglobin: 10.8 — AB (ref 12.0–16.0)
Neutrophils Absolute: 4960
Platelets: 182 10*3/uL (ref 150–400)
WBC: 6.5

## 2022-08-12 LAB — BASIC METABOLIC PANEL
BUN: 21 (ref 4–21)
CO2: 25 — AB (ref 13–22)
Chloride: 102 (ref 99–108)
Creatinine: 1.4 — AB (ref 0.5–1.1)
Glucose: 144
Potassium: 3.9 mEq/L (ref 3.5–5.1)
Sodium: 135 — AB (ref 137–147)

## 2022-08-12 LAB — CBC: RBC: 4.02 (ref 3.87–5.11)

## 2022-09-01 ENCOUNTER — Encounter: Payer: Self-pay | Admitting: Student

## 2022-09-01 ENCOUNTER — Non-Acute Institutional Stay (SKILLED_NURSING_FACILITY): Admitting: Student

## 2022-09-01 DIAGNOSIS — Z66 Do not resuscitate: Secondary | ICD-10-CM

## 2022-09-01 DIAGNOSIS — R5383 Other fatigue: Secondary | ICD-10-CM

## 2022-09-01 DIAGNOSIS — F5101 Primary insomnia: Secondary | ICD-10-CM

## 2022-09-01 NOTE — Progress Notes (Signed)
Location:  Other [facility]) Nursing Home Room Number: 205-A Place of Service:  SNF [phone]) Provider:  Dewayne Shorter, MD  Patient Care Team: Dewayne Shorter, MD as PCP - General [facility])  Extended Emergency Contact Information Primary Emergency Contact: Lonzo Cloud, [place] [address] Johnnette Litter of [place] Phone: [phone] Relation: Daughter  Code Status:  DNR  Goals of care: Advanced Directive information    09/01/2022   10:27 AM  Advanced Directives  Does Patient Have a Medical Advance Directive? Yes  Type of Advance Directive Out of facility DNR (pink MOST or yellow form)  Does patient want to make changes to medical advance directive? No - Patient declined  Pre-existing out of facility DNR order (yellow form or pink MOST form) Yellow form placed in chart (order not valid for inpatient use)     Chief Complaint  Patient presents with   Acute Visit    Insomnia. Vitals and medications are a reflection of [facility] system, [facility]      HPI:  Pt is a 86 y.o. female seen today for an acute visit for she has felt more sluggish for a few weeks. She had a fall -- she was goin through her mail and she didn't put her break on. She feels silly about the fall. Sleep has been fair. She can get into bed and fall asleep immediately but doesn't sleep well during the night. On computer until 9 and is on the computer until that time. She drinks 1 cup of coffee per day. Eats a lot of fruit. Eggs every other day. She doesn't enjoy lunch. Sleepy but not weak.   Constipation she is having 5-6x per day. She feels like it's a small amount.   She denies HA, SOB, CP, N/V, Diarrhea.    Past Medical History:  Diagnosis Date   Anginal pain ([place])    Anxiety    Diabetes mellitus without complication ([place])    Hypertension    Hypothyroidism    Protein C deficiency ([place])    Protein S deficiency ([place])    Past Surgical History:  Procedure  Laterality Date   ABDOMINAL SURGERY     BACK SURGERY     BREAST EXCISIONAL BIOPSY Left 2003?   benign   COLONOSCOPY N/A 09/12/2017   Procedure: COLONOSCOPY;  Surgeon: Lollie Sails, MD;  Location: [facility] ENDOSCOPY;  Service: Endoscopy;  Laterality: N/A;   COLONOSCOPY WITH PROPOFOL N/A 01/20/2016   Procedure: COLONOSCOPY WITH PROPOFOL;  Surgeon: Lollie Sails, MD;  Location: [facility] ENDOSCOPY;  Service: Endoscopy;  Laterality: N/A;   FRACTURE SURGERY     HIP ARTHROPLASTY Left 01/12/2019   Procedure: ARTHROPLASTY BIPOLAR HIP (HEMIARTHROPLASTY), LEFT;  Surgeon: Leim Fabry, MD;  Location: [facility] ORS;  Service: Orthopedics;  Laterality: Left;   PACEMAKER LEADLESS INSERTION N/A 09/20/2019   Procedure: PACEMAKER LEADLESS INSERTION;  Surgeon: Isaias Cowman, MD;  Location: [place] CV LAB;  Service: Cardiovascular;  Laterality: N/A;   PERIPHERAL VASCULAR CATHETERIZATION N/A 05/13/2015   Procedure: IVC Filter Removal;  Surgeon: Katha Cabal, MD;  Location: [place] CV LAB;  Service: Cardiovascular;  Laterality: N/A;   TEMPORARY PACEMAKER Right 09/20/2019   Procedure: TEMPORARY PACEMAKER;  Surgeon: Isaias Cowman, MD;  Location: [place] CV LAB;  Service: Cardiovascular;  Laterality: Right;   TONSILLECTOMY      Allergies  Allergen Reactions   Gentamicin Other (See Comments)    Kidney function  Morphine And Related Other (See Comments)    AMS - agitation and delusions **No legal documents to be signed if taking, per family**    Outpatient Encounter Medications as of 09/01/2022  Medication Sig   acetaminophen (TYLENOL) 325 MG tablet Take 650 mg by mouth every 6 (six) hours as needed for moderate pain or mild pain (pain).   amoxicillin (AMOXIL) 500 MG tablet Take 2,000 mg by mouth as needed. 1 hour before dental work   apixaban (ELIQUIS) 2.5 MG TABS tablet Take 2.5 mg by mouth 2 (two) times daily.   APRISO 0.375 g 24 hr capsule Take 1.5 g by mouth daily at 12 noon.    calcium carbonate (TUMS EX) 750 MG chewable tablet Chew 1-2 tablets by mouth every 8 (eight) hours as needed.   calcium-vitamin D (OSCAL WITH D) 500-5 MG-MCG tablet Take 1 tablet by mouth 2 (two) times daily.   carbamide peroxide (DEBROX) 6.5 % OTIC solution Place 5 drops into both ears as needed.   cetaphil (CETAPHIL) lotion Apply 1 Application topically 3 (three) times daily.   cholecalciferol (VITAMIN D3) 25 MCG (1000 UT) tablet Take 2 tablets by mouth daily. (taken with calcium)   diclofenac Sodium (VOLTAREN) 1 % GEL Apply 2 g topically every 8 (eight) hours as needed.   DULoxetine (CYMBALTA) 60 MG capsule Take 60 mg by mouth daily.   glipiZIDE (GLUCOTROL XL) 2.5 MG 24 hr tablet Take 2.5 mg by mouth daily.   hydrocortisone 2.5 % cream Apply 1 Application topically every 8 (eight) hours as needed.   levothyroxine (SYNTHROID) 75 MCG tablet Take 75 mcg by mouth daily before breakfast.   lisinopril (ZESTRIL) 20 MG tablet Take 20 mg by mouth 2 (two) times daily.   melatonin 3 MG TABS tablet Take 3 mg by mouth at bedtime.   omeprazole (PRILOSEC) 40 MG capsule Take 40 mg by mouth daily.   ondansetron (ZOFRAN) 4 MG tablet Take 4 mg by mouth every 8 (eight) hours as needed for nausea or vomiting.   sennosides-docusate sodium (SENOKOT-S) 8.6-50 MG tablet Take 2 tablets by mouth daily as needed.   sitaGLIPtin (JANUVIA) 25 MG tablet Take 1 tablet by mouth daily.   thiamine 100 MG tablet Take 100 mg by mouth daily.   vitamin B-12 (CYANOCOBALAMIN) 1000 MCG tablet Take 1,000 mcg by mouth daily.   [DISCONTINUED] alendronate (FOSAMAX) 70 MG tablet Take 70 mg by mouth every Sunday.   [DISCONTINUED] DULoxetine (CYMBALTA) 30 MG capsule Take 60 mg by mouth daily.   [DISCONTINUED] famotidine (PEPCID) 10 MG tablet Take 10 mg by mouth at bedtime.   [DISCONTINUED] furosemide (LASIX) 20 MG tablet Take 20 mg by mouth.   No facility-administered encounter medications on file as of 09/01/2022.    Review of  Systems  All other systems reviewed and are negative.   Immunization History  Administered Date(s) Administered   Fluad Quad(high Dose 65+) 08/10/2019   Influenza Split 09/14/2011, 08/31/2012, 08/29/2014, 09/22/2015   Influenza, High Dose Seasonal PF 12/04/2018, 12/04/2018, 08/24/2022   Influenza,inj,Quad PF,6+ Mos 11/02/2016   Influenza,inj,quad, With Preservative 08/31/2012   Influenza-Unspecified 08/08/2014, 08/29/2014, 09/22/2015, 09/26/2017   Moderna Sars-Covid-2 Vaccination 11/11/2019, 11/22/2019, 12/20/2019, 01/09/2020, 07/11/2020, 09/23/2020   Pfizer Covid-19 Vaccine Bivalent Booster 46yr & up 07/31/2021   Pneumococcal Polysaccharide-23 06/28/2011   Pneumococcal-Unspecified 06/08/2013   Tdap 11/09/2011, 01/05/2013, 02/13/2020   Pertinent  Health Maintenance Due  Topic Date Due   FOOT EXAM  Never done   OPHTHALMOLOGY EXAM  Never done  DEXA SCAN  Never done   HEMOGLOBIN A1C  02/11/2023   INFLUENZA VACCINE  Completed      04/25/2022    7:36 PM 04/26/2022    8:58 PM 04/27/2022   10:26 AM 04/27/2022   10:34 PM 04/28/2022    8:43 AM  Fall Risk  Patient Fall Risk Level High fall risk High fall risk High fall risk High fall risk High fall risk   Functional Status Survey:    Vitals:   09/01/22 1008  BP: (!) 162/78  Pulse: 69  Resp: 18  Temp: (!) 97.1 F (36.2 C)  SpO2: 96%  Weight: 181 lb (82.1 kg)  Height: '5\' 7"'$  (1.702 m)   Body mass index is 28.35 kg/m. Physical Exam Vitals reviewed.  HENT:     Head: Normocephalic and atraumatic.  Cardiovascular:     Rate and Rhythm: Normal rate.  Pulmonary:     Effort: Pulmonary effort is normal.     Breath sounds: Normal breath sounds.  Abdominal:     General: Bowel sounds are normal.     Palpations: Abdomen is soft.  Skin:    General: Skin is warm and dry.  Neurological:     Mental Status: She is alert and oriented to person, place, and time.     Labs reviewed: Recent Labs    04/26/22 0656 04/27/22 0633  04/28/22 0649 04/29/22 0000 05/06/22 0000 05/13/22 0000 08/12/22 0000  NA 144 146* 144   < > 139 137  139 135*  K 2.9* 3.1* 3.5   < > 4.4 4.1  3.7 3.9  CL 99 102 104   < > 108 104  106 102  CO2 36* 34* 31   < > 25* 25*  26* 25*  GLUCOSE 192* 168* 196*  --   --   --   --   BUN 27* 30* 26*   < > 23* 28*  20 21  CREATININE 1.17* 1.42* 1.32*   < > 1.6* 1.7*  1.5* 1.4*  CALCIUM 9.5 9.5 9.9   < > 9.2 9.5  8.8 9.2  MG  --  1.4* 2.0  --   --   --   --    < > = values in this interval not displayed.   Recent Labs    01/17/22 1022 04/23/22 0957 04/29/22 0000 05/06/22 0000 05/13/22 0000 08/12/22 0000  AST 19 16   < > '13 13 24  '$ ALT 11 13   < > '10 8 11  '$ ALKPHOS 42 47   < > 54 61 88  BILITOT 0.9 0.8  --   --   --   --   PROT 6.7 5.6*  --   --   --   --   ALBUMIN 3.7 3.2*   < > 3.7 3.7  3.7 3.2*   < > = values in this interval not displayed.   Recent Labs    04/26/22 0656 04/27/22 0633 04/28/22 0649 04/29/22 0000 05/06/22 0000 05/13/22 0000 08/12/22 0000  WBC 10.5 8.8 10.1   < > 8.1 6.4 6.5  NEUTROABS  --   --   --    < > 6,464.00 4,934.00 4,960.00  HGB 11.5* 11.9* 12.2  --  10.5* 10.9* 10.8*  HCT 36.9 37.7 38.0   < > 33* 34* 34*  MCV 92.0 91.5 91.3  --   --   --   --   PLT 117* 125* 118*   < > 180 168  182   < > = values in this interval not displayed.   Lab Results  Component Value Date   TSH 1.148 09/18/2019   Lab Results  Component Value Date   HGBA1C 7.4 08/12/2022   Lab Results  Component Value Date   CHOL 130 01/18/2022   HDL 41 01/18/2022   LDLCALC 73 01/18/2022   TRIG 82 01/18/2022   CHOLHDL 3.2 01/18/2022    Significant Diagnostic Results in last 30 days:  No results found.  Assessment/Plan 1. Primary insomnia Patient has had persistent issues with sleeping, and is in agreement with starting trazodone 50 mg nightly for insomnia per most recent conversation.  2. Do not resuscitate Patient maintains this status Per goals of care  3. Other  fatigue Patient continues to have worsening fatigue.  Unclear if this is due to progression of her underlying ovarian cancer which has metastasized to other organs.  Per goals of care she is not seeking any additional interventions.  Patient is concerned that she has worsening anemia, will plan to collect CBC, BMP to evaluate.    Family/ staff Communication: Nursing  Labs/tests ordered: BMP and CBC.  Tomasa Rand, MD, [facility] [phone]

## 2022-09-02 DIAGNOSIS — Z66 Do not resuscitate: Secondary | ICD-10-CM | POA: Insufficient documentation

## 2022-09-02 DIAGNOSIS — F5101 Primary insomnia: Secondary | ICD-10-CM | POA: Insufficient documentation

## 2022-09-02 LAB — CBC AND DIFFERENTIAL
HCT: 33 — AB (ref 36–46)
Hemoglobin: 10.4 — AB (ref 12.0–16.0)
Platelets: 232 10*3/uL (ref 150–400)
WBC: 10.4

## 2022-09-02 LAB — BASIC METABOLIC PANEL
BUN: 17 (ref 4–21)
CO2: 28 — AB (ref 13–22)
Chloride: 93 — AB (ref 99–108)
Creatinine: 1.3 — AB (ref 0.5–1.1)
Glucose: 140
Potassium: 4.2 mEq/L (ref 3.5–5.1)
Sodium: 130 — AB (ref 137–147)

## 2022-09-02 LAB — HEPATIC FUNCTION PANEL
ALT: 14 U/L (ref 7–35)
AST: 36 — AB (ref 13–35)
Alkaline Phosphatase: 115 (ref 25–125)
Bilirubin, Total: 1

## 2022-09-02 LAB — COMPREHENSIVE METABOLIC PANEL
Albumin: 3.3 — AB (ref 3.5–5.0)
Calcium: 9.2 (ref 8.7–10.7)
Globulin: 2.9

## 2022-09-02 LAB — CBC: RBC: 4.06 (ref 3.87–5.11)

## 2022-09-02 LAB — HEMOGLOBIN A1C: Hemoglobin A1C: 7.6

## 2022-09-02 LAB — TSH: TSH: 2.11 (ref 0.41–5.90)

## 2022-09-03 ENCOUNTER — Non-Acute Institutional Stay (SKILLED_NURSING_FACILITY): Admitting: Student

## 2022-09-03 ENCOUNTER — Encounter: Payer: Self-pay | Admitting: Student

## 2022-09-03 DIAGNOSIS — G4700 Insomnia, unspecified: Secondary | ICD-10-CM | POA: Diagnosis not present

## 2022-09-03 DIAGNOSIS — I10 Essential (primary) hypertension: Secondary | ICD-10-CM | POA: Diagnosis not present

## 2022-09-03 DIAGNOSIS — Z66 Do not resuscitate: Secondary | ICD-10-CM

## 2022-09-03 NOTE — Progress Notes (Unsigned)
Location:  Other [facility]) Nursing Home Room Number: 205-A Place of Service:  SNF [phone]) Provider:  Dewayne Shorter, MD  Patient Care Team: Dewayne Shorter, MD as PCP - General [facility])  Extended Emergency Contact Information Primary Emergency Contact: Lonzo Cloud, [place] [address] Johnnette Litter of [place] Phone: [phone] Relation: Daughter  Code Status:  DNR Goals of care: Advanced Directive information    09/03/2022   10:26 AM  Advanced Directives  Does Patient Have a Medical Advance Directive? Yes  Type of Advance Directive Out of facility DNR (pink MOST or yellow form)  Does patient want to make changes to medical advance directive? No - Patient declined  Pre-existing out of facility DNR order (yellow form or pink MOST form) Yellow form placed in chart (order not valid for inpatient use)     Chief Complaint  Patient presents with   Acute Visit    Hyponatremia. Vitals and medications are a reflection of [facility] system, [facility]      HPI:  Pt is a 86 y.o. female seen today for an acute visit for follow-up evaluation after starting most recent trazodone.  Nursing states that this morning patient was difficult to arouse and extremely groggy.  Received pharmacy consultation showing interactions between trazodone, Cymbalta, Zofran.  Patient does not take Zofran often.  Discussed concern for interactions with Zofran with patient regarding her increased sleepiness this morning and potential interactions with medications.  Patient states she is okay with continuing the trial of trazodone for her goal is to have good rest at this time.  Discussed most recent lab results that hemoglobin is stable at 10.4, sodium is low at 130 and we would like to start sodium supplementation for couple of days just to make sure that she does not continue to drop given the new medication.  She states she is in agreement with this plan and does not want  to have follow-up labs after 3 days of sodium.  She states she will "let us know" if she would like to have additional labs collected in the future.   Past Medical History:  Diagnosis Date   Anginal pain ([place])    Anxiety    Diabetes mellitus without complication ([place])    Hypertension    Hypothyroidism    Protein C deficiency ([place])    Protein S deficiency ([place])    Past Surgical History:  Procedure Laterality Date   ABDOMINAL SURGERY     BACK SURGERY     BREAST EXCISIONAL BIOPSY Left 2003?   benign   COLONOSCOPY N/A 09/12/2017   Procedure: COLONOSCOPY;  Surgeon: Lollie Sails, MD;  Location: [facility] ENDOSCOPY;  Service: Endoscopy;  Laterality: N/A;   COLONOSCOPY WITH PROPOFOL N/A 01/20/2016   Procedure: COLONOSCOPY WITH PROPOFOL;  Surgeon: Lollie Sails, MD;  Location: [facility] ENDOSCOPY;  Service: Endoscopy;  Laterality: N/A;   FRACTURE SURGERY     HIP ARTHROPLASTY Left 01/12/2019   Procedure: ARTHROPLASTY BIPOLAR HIP (HEMIARTHROPLASTY), LEFT;  Surgeon: Leim Fabry, MD;  Location: [facility] ORS;  Service: Orthopedics;  Laterality: Left;   PACEMAKER LEADLESS INSERTION N/A 09/20/2019   Procedure: PACEMAKER LEADLESS INSERTION;  Surgeon: Isaias Cowman, MD;  Location: [place] CV LAB;  Service: Cardiovascular;  Laterality: N/A;   PERIPHERAL VASCULAR CATHETERIZATION N/A 05/13/2015   Procedure: IVC Filter Removal;  Surgeon: Katha Cabal, MD;  Location: [place] CV LAB;  Service: Cardiovascular;  Laterality: N/A;  TEMPORARY PACEMAKER Right 09/20/2019   Procedure: TEMPORARY PACEMAKER;  Surgeon: Isaias Cowman, MD;  Location: [place] CV LAB;  Service: Cardiovascular;  Laterality: Right;   TONSILLECTOMY      Allergies  Allergen Reactions   Gentamicin Other (See Comments)    Kidney function   Morphine And Related Other (See Comments)    AMS - agitation and delusions **No legal documents to be signed if taking, per family**    Outpatient Encounter Medications  as of 09/03/2022  Medication Sig   acetaminophen (TYLENOL) 325 MG tablet Take 650 mg by mouth every 6 (six) hours as needed for moderate pain or mild pain (pain).   amoxicillin (AMOXIL) 500 MG tablet Take 2,000 mg by mouth as needed. 1 hour before dental work   apixaban (ELIQUIS) 2.5 MG TABS tablet Take 2.5 mg by mouth 2 (two) times daily.   APRISO 0.375 g 24 hr capsule Take 1.5 g by mouth daily at 12 noon.   calcium carbonate (TUMS EX) 750 MG chewable tablet Chew 1-2 tablets by mouth every 8 (eight) hours as needed.   calcium-vitamin D (OSCAL WITH D) 500-5 MG-MCG tablet Take 1 tablet by mouth 2 (two) times daily.   carbamide peroxide (DEBROX) 6.5 % OTIC solution Place 5 drops into both ears as needed.   cetaphil (CETAPHIL) lotion Apply 1 Application topically 3 (three) times daily.   cholecalciferol (VITAMIN D3) 25 MCG (1000 UT) tablet Take 2 tablets by mouth daily. (taken with calcium)   diclofenac Sodium (VOLTAREN) 1 % GEL Apply 2 g topically every 8 (eight) hours as needed.   DULoxetine (CYMBALTA) 60 MG capsule Take 60 mg by mouth daily.   glipiZIDE (GLUCOTROL XL) 2.5 MG 24 hr tablet Take 2.5 mg by mouth daily.   hydrocortisone 2.5 % cream Apply 1 Application topically every 8 (eight) hours as needed.   levothyroxine (SYNTHROID) 75 MCG tablet Take 75 mcg by mouth daily before breakfast.   lisinopril (ZESTRIL) 20 MG tablet Take 20 mg by mouth 2 (two) times daily.   melatonin 3 MG TABS tablet Take 3 mg by mouth at bedtime.   omeprazole (PRILOSEC) 40 MG capsule Take 40 mg by mouth daily.   ondansetron (ZOFRAN) 4 MG tablet Take 4 mg by mouth every 8 (eight) hours as needed for nausea or vomiting.   sennosides-docusate sodium (SENOKOT-S) 8.6-50 MG tablet Take 2 tablets by mouth daily as needed.   sitaGLIPtin (JANUVIA) 25 MG tablet Take 1 tablet by mouth daily.   thiamine 100 MG tablet Take 100 mg by mouth daily.   vitamin B-12 (CYANOCOBALAMIN) 1000 MCG tablet Take 1,000 mcg by mouth daily.    No facility-administered encounter medications on file as of 09/03/2022.    Review of Systems  Immunization History  Administered Date(s) Administered   Fluad Quad(high Dose 65+) 08/10/2019   Influenza Split 09/14/2011, 08/31/2012, 08/29/2014, 09/22/2015   Influenza, High Dose Seasonal PF 12/04/2018, 12/04/2018, 08/24/2022   Influenza,inj,Quad PF,6+ Mos 11/02/2016   Influenza,inj,quad, With Preservative 08/31/2012   Influenza-Unspecified 08/08/2014, 08/29/2014, 09/22/2015, 09/26/2017   Moderna Sars-Covid-2 Vaccination 11/11/2019, 11/22/2019, 12/20/2019, 01/09/2020, 07/11/2020, 09/23/2020   Pfizer Covid-19 Vaccine Bivalent Booster 14yr & up 07/31/2021   Pneumococcal Polysaccharide-23 06/28/2011   Pneumococcal-Unspecified 06/08/2013   Tdap 11/09/2011, 01/05/2013, 02/13/2020   Pertinent  Health Maintenance Due  Topic Date Due   FOOT EXAM  Never done   OPHTHALMOLOGY EXAM  Never done   DEXA SCAN  Never done   HEMOGLOBIN A1C  03/04/2023   INFLUENZA  VACCINE  Completed      04/25/2022    7:36 PM 04/26/2022    8:58 PM 04/27/2022   10:26 AM 04/27/2022   10:34 PM 04/28/2022    8:43 AM  Fall Risk  Patient Fall Risk Level High fall risk High fall risk High fall risk High fall risk High fall risk   Functional Status Survey:    Vitals:   09/03/22 1025  BP: (!) 141/81  Pulse: 70  Resp: 18  Temp: (!) 97.4 F (36.3 C)  SpO2: 90%  Weight: 181 lb (82.1 kg)  Height: '5\' 7"'$  (1.702 m)   Body mass index is 28.35 kg/m. Physical Exam  Labs reviewed: Recent Labs    04/26/22 0656 04/27/22 0633 04/28/22 0649 04/29/22 0000 05/13/22 0000 08/12/22 0000 09/02/22 0000  NA 144 146* 144   < > 137  139 135* 130*  K 2.9* 3.1* 3.5   < > 4.1  3.7 3.9 4.2  CL 99 102 104   < > 104  106 102 93*  CO2 36* 34* 31   < > 25*  26* 25* 28*  GLUCOSE 192* 168* 196*  --   --   --   --   BUN 27* 30* 26*   < > 28*  '20 21 17  '$ CREATININE 1.17* 1.42* 1.32*   < > 1.7*  1.5* 1.4* 1.3*  CALCIUM 9.5  9.5 9.9   < > 9.5  8.8 9.2 9.2  MG  --  1.4* 2.0  --   --   --   --    < > = values in this interval not displayed.   Recent Labs    01/17/22 1022 04/23/22 0957 04/29/22 0000 05/13/22 0000 08/12/22 0000 09/02/22 0000  AST 19 16   < > 13 24 36*  ALT 11 13   < > '8 11 14  '$ ALKPHOS 42 47   < > 61 88 115  BILITOT 0.9 0.8  --   --   --   --   PROT 6.7 5.6*  --   --   --   --   ALBUMIN 3.7 3.2*   < > 3.7  3.7 3.2* 3.3*   < > = values in this interval not displayed.   Recent Labs    04/26/22 0656 04/27/22 0633 04/28/22 0649 04/29/22 0000 05/06/22 0000 05/13/22 0000 08/12/22 0000 09/02/22 0000  WBC 10.5 8.8 10.1   < > 8.1 6.4 6.5 10.4  NEUTROABS  --   --   --    < > 6,464.00 4,934.00 4,960.00  --   HGB 11.5* 11.9* 12.2  --  10.5* 10.9* 10.8* 10.4*  HCT 36.9 37.7 38.0   < > 33* 34* 34* 33*  MCV 92.0 91.5 91.3  --   --   --   --   --   PLT 117* 125* 118*   < > 180 168 182 232   < > = values in this interval not displayed.   Lab Results  Component Value Date   TSH 2.11 09/02/2022   Lab Results  Component Value Date   HGBA1C 7.6 09/02/2022   Lab Results  Component Value Date   CHOL 130 01/18/2022   HDL 41 01/18/2022   LDLCALC 73 01/18/2022   TRIG 82 01/18/2022   CHOLHDL 3.2 01/18/2022    Significant Diagnostic Results in last 30 days:  No results found.  Assessment/Plan 1. Insomnia, unspecified type Patient's sleep improved with addition  of trazodone and she would like to continue despite potential side effects. Will keep medications as is for trazodone and cymbalta. Continue to monitor for symptoms.   2. Primary hypertension Bp slightly elevated today. Patient's BP are typically well-controlled. Will defer interventions at this time.   3. Do not resuscitate Patient maintains this CODE STATUS of DNR.     Family/ staff Communication: nursing staff  Labs/tests ordered:  none  Tomasa Rand, MD, [facility] [phone]

## 2022-09-05 ENCOUNTER — Encounter: Payer: Self-pay | Admitting: Student

## 2022-09-06 ENCOUNTER — Emergency Department: Payer: Medicare Other

## 2022-09-06 ENCOUNTER — Emergency Department
Admission: EM | Admit: 2022-09-06 | Discharge: 2022-09-06 | Disposition: A | Discharge status: Home / Self Care | Payer: Medicare Other | Attending: Emergency Medicine | Admitting: Emergency Medicine

## 2022-09-06 ENCOUNTER — Other Ambulatory Visit: Payer: Self-pay

## 2022-09-06 ENCOUNTER — Encounter (INDEPENDENT_AMBULATORY_CARE_PROVIDER_SITE_OTHER): Payer: Self-pay

## 2022-09-06 ENCOUNTER — Encounter: Payer: Self-pay | Admitting: Emergency Medicine

## 2022-09-06 DIAGNOSIS — W07XXXA Fall from chair, initial encounter: Secondary | ICD-10-CM | POA: Diagnosis not present

## 2022-09-06 DIAGNOSIS — S0990XA Unspecified injury of head, initial encounter: Secondary | ICD-10-CM | POA: Diagnosis present

## 2022-09-06 DIAGNOSIS — Z8543 Personal history of malignant neoplasm of ovary: Secondary | ICD-10-CM | POA: Diagnosis not present

## 2022-09-06 DIAGNOSIS — E871 Hypo-osmolality and hyponatremia: Secondary | ICD-10-CM | POA: Diagnosis not present

## 2022-09-06 DIAGNOSIS — Y92129 Unspecified place in nursing home as the place of occurrence of the external cause: Secondary | ICD-10-CM | POA: Insufficient documentation

## 2022-09-06 DIAGNOSIS — W19XXXA Unspecified fall, initial encounter: Secondary | ICD-10-CM

## 2022-09-06 DIAGNOSIS — E039 Hypothyroidism, unspecified: Secondary | ICD-10-CM | POA: Insufficient documentation

## 2022-09-06 DIAGNOSIS — N3000 Acute cystitis without hematuria: Secondary | ICD-10-CM

## 2022-09-06 DIAGNOSIS — I2693 Single subsegmental pulmonary embolism without acute cor pulmonale: Secondary | ICD-10-CM | POA: Diagnosis not present

## 2022-09-06 DIAGNOSIS — R944 Abnormal results of kidney function studies: Secondary | ICD-10-CM | POA: Diagnosis not present

## 2022-09-06 DIAGNOSIS — Z7901 Long term (current) use of anticoagulants: Secondary | ICD-10-CM | POA: Insufficient documentation

## 2022-09-06 DIAGNOSIS — C569 Malignant neoplasm of unspecified ovary: Secondary | ICD-10-CM

## 2022-09-06 LAB — COMPREHENSIVE METABOLIC PANEL
ALT: 19 U/L (ref 0–44)
AST: 42 U/L — ABNORMAL HIGH (ref 15–41)
Albumin: 3.1 g/dL — ABNORMAL LOW (ref 3.5–5.0)
Alkaline Phosphatase: 122 U/L (ref 38–126)
Anion gap: 12 (ref 5–15)
BUN: 23 mg/dL (ref 8–23)
CO2: 25 mmol/L (ref 22–32)
Calcium: 9.5 mg/dL (ref 8.9–10.3)
Chloride: 95 mmol/L — ABNORMAL LOW (ref 98–111)
Creatinine, Ser: 1.19 mg/dL — ABNORMAL HIGH (ref 0.44–1.00)
GFR, Estimated: 45 mL/min — ABNORMAL LOW (ref >60)
Glucose, Bld: 168 mg/dL — ABNORMAL HIGH (ref 70–99)
Potassium: 4.2 mmol/L (ref 3.5–5.1)
Sodium: 132 mmol/L — ABNORMAL LOW (ref 135–145)
Total Bilirubin: 1.2 mg/dL (ref 0.3–1.2)
Total Protein: 6.7 g/dL (ref 6.5–8.1)

## 2022-09-06 LAB — CBC WITH DIFFERENTIAL/PLATELET
Abs Immature Granulocytes: 0.06 10*3/uL (ref 0.00–0.07)
Basophils Absolute: 0 10*3/uL (ref 0.0–0.1)
Basophils Relative: 0 %
Eosinophils Absolute: 0 10*3/uL (ref 0.0–0.5)
Eosinophils Relative: 0 %
HCT: 34 % — ABNORMAL LOW (ref 36.0–46.0)
Hemoglobin: 10.6 g/dL — ABNORMAL LOW (ref 12.0–15.0)
Immature Granulocytes: 1 %
Lymphocytes Relative: 4 %
Lymphs Abs: 0.4 10*3/uL — ABNORMAL LOW (ref 0.7–4.0)
MCH: 25.5 pg — ABNORMAL LOW (ref 26.0–34.0)
MCHC: 31.2 g/dL (ref 30.0–36.0)
MCV: 81.7 fL (ref 80.0–100.0)
Monocytes Absolute: 1.2 10*3/uL — ABNORMAL HIGH (ref 0.1–1.0)
Monocytes Relative: 12 %
Neutro Abs: 8.5 10*3/uL — ABNORMAL HIGH (ref 1.7–7.7)
Neutrophils Relative %: 83 %
Platelets: 296 10*3/uL (ref 150–400)
RBC: 4.16 MIL/uL (ref 3.87–5.11)
RDW: 15.9 % — ABNORMAL HIGH (ref 11.5–15.5)
WBC: 10.3 10*3/uL (ref 4.0–10.5)
nRBC: 0 % (ref 0.0–0.2)

## 2022-09-06 LAB — URINALYSIS, ROUTINE W REFLEX MICROSCOPIC
Bilirubin Urine: NEGATIVE
Glucose, UA: NEGATIVE mg/dL
Hgb urine dipstick: NEGATIVE
Ketones, ur: 5 mg/dL — AB
Nitrite: POSITIVE — AB
Protein, ur: 30 mg/dL — AB
Specific Gravity, Urine: 1.044 — ABNORMAL HIGH (ref 1.005–1.030)
pH: 5 (ref 5.0–8.0)

## 2022-09-06 LAB — TROPONIN I (HIGH SENSITIVITY): Troponin I (High Sensitivity): 12 ng/L (ref <18)

## 2022-09-06 MED ORDER — APIXABAN 5 MG PO TABS: 5.0000 mg | ORAL_TABLET | Freq: Two times a day (BID) | ORAL | 2 refills | Status: DC

## 2022-09-06 MED ORDER — CEPHALEXIN 500 MG PO CAPS: 500.0000 mg | ORAL_CAPSULE | Freq: Two times a day (BID) | ORAL | 0 refills | Status: DC

## 2022-09-06 MED ORDER — CEPHALEXIN 500 MG PO CAPS: 500.0000 mg | ORAL_CAPSULE | Freq: Two times a day (BID) | ORAL | 0 refills | Status: AC

## 2022-09-06 MED ORDER — IOHEXOL 300 MG/ML  SOLN
50.0000 mL | Freq: Once | INTRAMUSCULAR | Status: AC | PRN
  Administered 2022-09-06: 50 mL via INTRAVENOUS

## 2022-09-06 MED ORDER — SODIUM CHLORIDE 0.9 % IV SOLN
1.0000 g | Freq: Once | INTRAVENOUS | Status: AC
  Administered 2022-09-06: 1 g via INTRAVENOUS
  Filled 2022-09-06: qty 10

## 2022-09-06 NOTE — ED Provider Notes (Signed)
[facility] Provider Note    Event Date/Time   First MD Initiated Contact with Patient 09/06/22 [id number]     (approximate)   History   Fall   HPI  Tani Virgo is a 86 y.o. 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  I reviewed patient's discharge summary on 04/23/2022 patient has a history of ovarian cancer but as of that date they had discontinued chemotherapy due to the can continue to spread of it and they been following with palliative.   Patient comes from [facility] for unwitnessed fall where she was standing up from her recliner and her feet slipped out from underneath her.  Patient was concerned given she is on Eliquis 2.5 mg twice daily.  Patient is here with her daughter.  Patient has this history of metastatic cancer which she is on hospice for but she continues to live her life and is very active at her nursing home.  She is wheelchair-bound and occasionally will have some nightmares and then think that she can stand up and have a full.  They do report having 2 falls in the past 2 weeks.  They deny any chest pain shortness of breath or other concerns they have been compliant with medications.  Physical Exam   Triage Vital Signs: ED Triage Vitals [09/06/22 0324]  Enc Vitals Group     BP (!) 151/85     Pulse Rate 64     Resp 20     Temp 98.2 F (36.8 C)     Temp Source Oral     SpO2 96 %     Weight 175 lb (79.4 kg)     Height '5\' 7"'$  (1.702 m)     Head Circumference      Peak Flow      Pain Score 0     Pain Loc      Pain Edu?      Excl. in [place]?     Most recent vital signs: Vitals:   09/06/22 0748 09/06/22 0752  BP: (!) 184/87 (!) 148/80  Pulse: 65 60  Resp: 16 18  Temp:  98 F (36.7 C)  SpO2: 96% 97%     General: Awake, no distress.   CV:  Good peripheral perfusion.  Resp:  Normal effort.  Abd:  No distention.  Other:  Able to lift both legs up off the bed.  Equal strength in arms.  Abdomen is soft and nontender. No CTL spine tenderness  ED Results / Procedures / Treatments   Labs (all labs ordered are listed, but only abnormal results are displayed) Labs Reviewed  CBC WITH DIFFERENTIAL/PLATELET - Abnormal; Notable for the following components:      Result Value   Hemoglobin 10.6 (*)    HCT 34.0 (*)    MCH 25.5 (*)    RDW 15.9 (*)    Neutro Abs 8.5 (*)    Lymphs Abs 0.4 (*)    Monocytes Absolute 1.2 (*)    All other components within normal limits  COMPREHENSIVE METABOLIC PANEL - Abnormal; Notable for the following components:   Sodium 132 (*)    Chloride 95 (*)    Glucose, Bld 168 (*)    Creatinine, Ser 1.19 (*)    Albumin 3.1 (*)    AST 42 (*)  GFR, Estimated 45 (*)    All other components within normal limits  URINALYSIS, ROUTINE W REFLEX MICROSCOPIC     EKG  My interpretation of EKG:  Ventricularly paced rhythm with a rate of 67 without any ST elevations, T wave inversion in aVL.  Widened QRS secondary to being paced  RADIOLOGY I have reviewed the xray personally and interpreted and no evidence of hip fracture.  PROCEDURES:  Critical Care performed: No  Procedures   MEDICATIONS ORDERED IN ED: Medications  iohexol (OMNIPAQUE) 300 MG/ML solution 50 mL (50 mLs Intravenous Contrast Given 09/06/22 0659)     IMPRESSION / MDM / ASSESSMENT AND PLAN / ED COURSE  I reviewed the triage vital signs and the nursing notes.   Patient's presentation is most consistent with acute presentation with potential threat to life or bodily function.  Differential is intracranial hemorrhage, cervical fracture, hip fracture, rib fracture, dehydration.  CMP shows slightly low sodium and chloride but similar to prior with slightly elevated creatinine and AST Her hemoglobin is stable from 4 days  ago  Chest x-ray was concerning for development of some nodules they recommended a CT imaging.  X-ray of the hip was negative and CT head and neck were negative  CT of her chest shows positive metastatic cancer as well as a left lower lobe PE Part of the lower abdomen did show some mets to the liver as well  Had a very extensive conversation with the family about goals of care given patient is on hospice.  We discussed the gold standard is admission to the hospital for IV heparin and monitoring but they do not want to be admitted to the hospital.  They understand the risk for death but they state that when she comes in the hospital that she has hospital delirium and they do not want to do this.  We discussed about the potentially going up on the Eliquis and how there further could be risk with her falling off or hitting her head and they were more interested in the possibility of this.  I discussed the case with Dr. Janese Banks from medical oncology and given her creatinine is only 1.19 she recommended going up on the Eliquis to 5 mg twice daily.  She thinks that we should do this for at least 3 months and then the if she can be reevaluated at that time.  She agreed with trying to keep her out of the hospital given she is on hospice and goals of care as above.  Discussed with Dr. Lucky Cowboy vascular to see if IVC filter would be helpful as given cancer history she would need to be on full anticoagulation anyways so did not recommend.  I had considered admission and offered admission to family but given patient being on hospice and goals of care they would prefer to go home on the increased Eliquis dose knowing the risk for falls and brain bleeds leading to death and will return if she develops a fall and hits her head or any other concerns.   We will discharge patient after urine   FINAL CLINICAL IMPRESSION(S) / ED DIAGNOSES   Final diagnoses:  Single subsegmental pulmonary embolism without acute cor pulmonale  ([place])  Fall, initial encounter     Rx / DC Orders   ED Discharge Orders          Ordered    apixaban (ELIQUIS) 5 MG TABS tablet  2 times daily        09/06/22 1014  Note:  This document was prepared using Dragon voice recognition software and may include unintentional dictation errors.   Vanessa Sully, MD 09/06/22 1016

## 2022-09-06 NOTE — ED Triage Notes (Signed)
First nurse note- Pt From [facility] via [place] with c/o unwitnessed fall. Pt was standing up from recliner and feet slipped out from underneath her. Pt denies pain, denies injury, denies hitting.  Per EMS staff was concerned because pt is on Eliquis. Neg LOC.  Pt alert and oriented, ambulatory at baseline.

## 2022-09-06 NOTE — ED Triage Notes (Addendum)
Patient states she slipped and fell twice in the past three days. Denies hitting head, denies LOC. Reports left hip pain initially with fall but now states she is pain free. Patient AOX4. Resp even, unlabored on RA. States she has felt more tired the past few days. Reports mild burning with urination.

## 2022-09-06 NOTE — Discharge Instructions (Addendum)
Her CT scan showed a new pulmonary embolism and given she is on the Eliquis 2.5 mg twice daily I have discussed with the hematologist and we are going to increase this to 5 mg twice daily.  She can just take 2 of her normal pills until she gets this new prescription.  She should discuss further with either the hematologist Dr. Janese Banks and call and make an appointment or her hospice doctor about benefits and risk of continuing this after 3 months.   I had an extensive conversation with patient and the daughter about the increased risk for bleeding especially if she hits her head therefore it is very important that she remains wheelchair-bound and everything is done to minimize falls.  But if she does have a fall please bring her back to the ER to be evaluated.   We are also treating her for a UTI

## 2022-09-07 ENCOUNTER — Telehealth: Payer: Self-pay

## 2022-09-07 ENCOUNTER — Encounter: Payer: Self-pay | Admitting: Nurse Practitioner

## 2022-09-07 ENCOUNTER — Non-Acute Institutional Stay (SKILLED_NURSING_FACILITY): Payer: Medicare Other | Admitting: Nurse Practitioner

## 2022-09-07 DIAGNOSIS — E871 Hypo-osmolality and hyponatremia: Secondary | ICD-10-CM

## 2022-09-07 DIAGNOSIS — C7800 Secondary malignant neoplasm of unspecified lung: Secondary | ICD-10-CM

## 2022-09-07 DIAGNOSIS — I2693 Single subsegmental pulmonary embolism without acute cor pulmonale: Secondary | ICD-10-CM

## 2022-09-07 DIAGNOSIS — Z515 Encounter for palliative care: Secondary | ICD-10-CM

## 2022-09-07 DIAGNOSIS — R410 Disorientation, unspecified: Secondary | ICD-10-CM | POA: Diagnosis not present

## 2022-09-07 DIAGNOSIS — C569 Malignant neoplasm of unspecified ovary: Secondary | ICD-10-CM
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
## 2022-09-07 NOTE — Telephone Encounter (Signed)
Transition Care Management Unsuccessful Follow-up Telephone Call  Date of discharge and from where:  09/05/2022, [facility]  Attempts:  1st Attempt  Reason for unsuccessful TCM follow-up call:  Unable to reach patient, skilled nursing facility

## 2022-09-08 LAB — BASIC METABOLIC PANEL
BUN: 20 (ref 4–21)
CO2: 28 — AB (ref 13–22)
Chloride: 94 — AB (ref 99–108)
Creatinine: 1.1 (ref 0.5–1.1)
Glucose: 158
Potassium: 4.4 mEq/L (ref 3.5–5.1)
Sodium: 134 — AB (ref 137–147)

## 2022-09-08 LAB — CBC AND DIFFERENTIAL
HCT: 31 — AB (ref 36–46)
Hemoglobin: 10 — AB (ref 12.0–16.0)
Neutrophils Absolute: 9380
Platelets: 294 10*3/uL (ref 150–400)
WBC: 11.1

## 2022-09-08 LAB — URINE CULTURE: Culture: >=100000 — AB

## 2022-09-08 LAB — CBC: RBC: 3.92 (ref 3.87–5.11)

## 2022-09-08 LAB — COMPREHENSIVE METABOLIC PANEL
Calcium: 9.4 (ref 8.7–10.7)
eGFR: 49

## 2022-09-08 LAB — TSH: TSH: 2.08 (ref 0.41–5.90)

## 2022-09-14 NOTE — Progress Notes (Signed)
To evaluate her UTI to know if antibiotics working

## 2022-09-20 ENCOUNTER — Non-Acute Institutional Stay (SKILLED_NURSING_FACILITY): Admitting: Student

## 2022-09-20 ENCOUNTER — Encounter: Payer: Self-pay | Admitting: Student

## 2022-09-20 ENCOUNTER — Other Ambulatory Visit: Payer: Self-pay | Admitting: Student

## 2022-09-20 DIAGNOSIS — Z66 Do not resuscitate: Secondary | ICD-10-CM

## 2022-09-20 DIAGNOSIS — J9602 Acute respiratory failure with hypercapnia: Secondary | ICD-10-CM

## 2022-09-20 DIAGNOSIS — Z515 Encounter for palliative care: Secondary | ICD-10-CM | POA: Diagnosis not present

## 2022-09-20 DIAGNOSIS — I82501 Chronic embolism and thrombosis of unspecified deep veins of right lower extremity: Secondary | ICD-10-CM | POA: Diagnosis not present

## 2022-09-20 DIAGNOSIS — R410 Disorientation, unspecified: Secondary | ICD-10-CM

## 2022-09-20 DIAGNOSIS — J9601 Acute respiratory failure with hypoxia: Secondary | ICD-10-CM | POA: Diagnosis not present

## 2022-09-20 DIAGNOSIS — C569 Malignant neoplasm of unspecified ovary: Secondary | ICD-10-CM

## 2022-09-20 MED ORDER — MORPHINE SULFATE (CONCENTRATE) 20 MG/ML PO SOLN: 5.0000 mg | ORAL | 0 refills | Status: DC | PRN

## 2022-09-20 MED ORDER — LORAZEPAM 0.5 MG PO TABS: 0.5000 mg | ORAL_TABLET | Freq: Four times a day (QID) | ORAL | 0 refills | Status: DC | PRN

## 2022-09-20 MED ORDER — MORPHINE SULFATE 20 MG/5ML PO SOLN: 5.0000 mg | ORAL | 0 refills | Status: DC | PRN

## 2022-09-20 NOTE — Progress Notes (Signed)
Location:  Other [place] Room Number: [place] of Service:  SNF [phone]) Provider:  Dr. Amada Kingfisher, MD  Patient Care Team: Dewayne Shorter, MD as PCP - General [facility])  Extended Emergency Contact Information Primary Emergency Contact: [place], [place] [address] [address] Litter of [place] Phone: [phone] Relation: Daughter  Code Status:  DNR Goals of care: Advanced Directive information    09/20/2022   10:26 AM  Advanced Directives  Does Patient Have a Medical Advance Directive? Yes  Type of Advance Directive Out of facility DNR (pink MOST or yellow form)  Does patient want to make changes to medical advance directive? No - Patient declined     Chief Complaint  Patient presents with   Acute Visit    HPI:  Pt is a 86 y.o. female seen today for an acute visit for uncontrolled end of life care symptoms.   Daughter, [name] is at bedside. She states the only reason morphine was on her allergies was because she previously was very confused with the medication, however, she understands benefit outweigh the risks and she would like the medication removed from her medications. She would like morphine removed from her allergy list. She also states she plans to bring her father over to see her soon since she seems to be transitioning at this time.   Patient has increased work of breathing. Is awake, but only moaning, jerking in bed.    Past Medical History:  Diagnosis Date   Anginal pain ([place])    Anxiety    Diabetes mellitus without complication ([place])    Hypertension    Hypothyroidism    Protein C deficiency ([place])    Protein S deficiency ([place])    Past Surgical History:  Procedure Laterality Date   ABDOMINAL SURGERY     BACK SURGERY     BREAST EXCISIONAL BIOPSY Left 2003?   benign   COLONOSCOPY N/A 09/12/2017   Procedure: COLONOSCOPY;  Surgeon: Lollie Sails, MD;  Location: [facility] ENDOSCOPY;   Service: Endoscopy;  Laterality: N/A;   COLONOSCOPY WITH PROPOFOL N/A 01/20/2016   Procedure: COLONOSCOPY WITH PROPOFOL;  Surgeon: Lollie Sails, MD;  Location: [facility] ENDOSCOPY;  Service: Endoscopy;  Laterality: N/A;   FRACTURE SURGERY     HIP ARTHROPLASTY Left 01/12/2019   Procedure: ARTHROPLASTY BIPOLAR HIP (HEMIARTHROPLASTY), LEFT;  Surgeon: Leim Fabry, MD;  Location: [facility] ORS;  Service: Orthopedics;  Laterality: Left;   PACEMAKER LEADLESS INSERTION N/A 09/20/2019   Procedure: PACEMAKER LEADLESS INSERTION;  Surgeon: Isaias Cowman, MD;  Location: [facility] CV LAB;  Service: Cardiovascular;  Laterality: N/A;   PERIPHERAL VASCULAR CATHETERIZATION N/A 05/13/2015   Procedure: IVC Filter Removal;  Surgeon: Katha Cabal, MD;  Location: [facility] CV LAB;  Service: Cardiovascular;  Laterality: N/A;   TEMPORARY PACEMAKER Right 09/20/2019   Procedure: TEMPORARY PACEMAKER;  Surgeon: Isaias Cowman, MD;  Location: [place] CV LAB;  Service: Cardiovascular;  Laterality: Right;   TONSILLECTOMY      Allergies  Allergen Reactions   Gentamicin Other (See Comments)    Kidney function    Outpatient Encounter Medications as of 09/20/2022  Medication Sig   acetaminophen (TYLENOL) 325 MG tablet Take 650 mg by mouth every 6 (six) hours as needed for moderate pain or mild pain (pain).   carbamide peroxide (DEBROX) 6.5 % OTIC solution Place 5 drops into both ears as needed.   cetaphil (CETAPHIL) lotion  Apply 1 Application topically 3 (three) times daily.   diclofenac Sodium (VOLTAREN) 1 % GEL Apply 2 g topically every 8 (eight) hours as needed.   hydrocortisone 2.5 % cream Apply 1 Application topically every 8 (eight) hours as needed.   Infant Care Products [facility]) OINT Apply to buttocks topically every shift for redness.   LORazepam (ATIVAN) 0.5 MG tablet Take 1 tablet (0.5 mg total) by mouth every 6 (six) hours as needed for anxiety (agitation, restlessness).   morphine  (ROXANOL) 20 MG/ML concentrated solution Take 0.25 mLs (5 mg total) by mouth every 2 (two) hours as needed for severe pain.   OXYGEN 2lpm via nasal cannula as needed   [DISCONTINUED] morphine 20 MG/5ML solution Take 1.3 mLs (5.2 mg total) by mouth every 2 (two) hours as needed for pain.   No facility-administered encounter medications on file as of 09/20/2022.    Review of Systems  Immunization History  Administered Date(s) Administered   Fluad Quad(high Dose 65+) 08/10/2019   Influenza Split 09/14/2011, 08/31/2012, 08/29/2014, 09/22/2015   Influenza, High Dose Seasonal PF 12/04/2018, 12/04/2018, 08/24/2022   Influenza,inj,Quad PF,6+ Mos 11/02/2016   Influenza,inj,quad, With Preservative 08/31/2012   Influenza-Unspecified 08/08/2014, 08/29/2014, 09/22/2015, 09/26/2017, 08/24/2022   Moderna Sars-Covid-2 Vaccination 11/11/2019, 11/22/2019, 12/20/2019, 01/09/2020, 07/11/2020, 09/23/2020   Pfizer Covid-19 Vaccine Bivalent Booster 73yr & up 07/31/2021   Pneumococcal Polysaccharide-23 06/28/2011   Pneumococcal-Unspecified 06/08/2013   Tdap 11/09/2011, 01/05/2013, 02/13/2020   Pertinent  Health Maintenance Due  Topic Date Due   FOOT EXAM  Never done   OPHTHALMOLOGY EXAM  Never done   DEXA SCAN  Never done   HEMOGLOBIN A1C  03/04/2023   INFLUENZA VACCINE  Completed      04/26/2022    8:58 PM 04/27/2022   10:26 AM 04/27/2022   10:34 PM 04/28/2022    8:43 AM 09/06/2022    3:27 AM  Fall Risk  Patient Fall Risk Level High fall risk High fall risk High fall risk High fall risk Moderate fall risk   Functional Status Survey:    There were no vitals filed for this visit. There is no height or weight on file to calculate BMI. Physical Exam Cardiovascular:     Rate and Rhythm: Normal rate.  Pulmonary:     Comments: Increased work of breathing, Happy in place Skin:    Comments: Warm and dry  Neurological:     Mental Status: She is alert.     Comments: Patient is disoriented, unable to  speakful words     Labs reviewed: Recent Labs    04/27/22 0633 04/28/22 0649 04/29/22 0000 09/02/22 0000 09/06/22 0340 09/08/22 0000  NA 146* 144   < > 130* 132* 134*  K 3.1* 3.5   < > 4.2 4.2 4.4  CL 102 104   < > 93* 95* 94*  CO2 34* 31   < > 28* 25 28*  GLUCOSE 168* 196*  --   --  168*  --   BUN 30* 26*   < > '17 23 20  '$ CREATININE 1.42* 1.32*   < > 1.3* 1.19* 1.1  CALCIUM 9.5 9.9   < > 9.2 9.5 9.4  MG 1.4* 2.0  --   --   --   --    < > = values in this interval not displayed.   Recent Labs    01/17/22 1022 04/23/22 0957 04/29/22 0000 08/12/22 0000 09/02/22 0000 09/06/22 0340  AST 19 16   < > 24  36* 42*  ALT 11 13   < > '11 14 19  '$ ALKPHOS 42 47   < > 88 115 122  BILITOT 0.9 0.8  --   --   --  1.2  PROT 6.7 5.6*  --   --   --  6.7  ALBUMIN 3.7 3.2*   < > 3.2* 3.3* 3.1*   < > = values in this interval not displayed.   Recent Labs    04/27/22 0633 04/28/22 0649 04/29/22 0000 08/12/22 0000 09/02/22 0000 09/06/22 0340 09/08/22 0000  WBC 8.8 10.1   < > 6.5 10.4 10.3 11.1  NEUTROABS  --   --    < > 4,960.00  --  8.5* 9,380.00  HGB 11.9* 12.2   < > 10.8* 10.4* 10.6* 10.0*  HCT 37.7 38.0   < > 34* 33* 34.0* 31*  MCV 91.5 91.3  --   --   --  81.7  --   PLT 125* 118*   < > 182 232 296 294   < > = values in this interval not displayed.   Lab Results  Component Value Date   TSH 2.08 09/08/2022   Lab Results  Component Value Date   HGBA1C 7.6 09/02/2022   Lab Results  Component Value Date   CHOL 130 01/18/2022   HDL 41 01/18/2022   LDLCALC 73 01/18/2022   TRIG 82 01/18/2022   CHOLHDL 3.2 01/18/2022    Significant Diagnostic Results in last 30 days:  CT Chest W Contrast  Addendum Date: 09/06/2022   ADDENDUM REPORT: 09/06/2022 08:18 ADDENDUM: Study discussed by telephone with Dr. Cyril Mourning WARD on 09/06/2022 at 0807 hours. She advises that she thinks this patient carries a diagnosis of ovarian cancer. Electronically Signed   By: Genevie Ann M.D.   On:  09/06/2022 08:18   Result Date: 09/06/2022 CLINICAL DATA:  86 year old female with new bilateral lung nodules on portable chest this morning. Fall with left hip pain. On Eliquis. EXAM: CT CHEST WITH CONTRAST TECHNIQUE: Multidetector CT imaging of the chest was performed during intravenous contrast administration. RADIATION DOSE REDUCTION: This exam was performed according to the departmental dose-optimization program which includes automated exposure control, adjustment of the mA and/or kV according to patient size and/or use of iterative reconstruction technique. CONTRAST:  3m OMNIPAQUE IOHEXOL 300 MG/ML  SOLN COMPARISON:  Portable chest 0351 hours today. CT Abdomen and Pelvis 10/23/2020. FINDINGS: Cardiovascular: Right chest Port-A-Cath. Calcified aortic atherosclerosis. Negative for thoracic aortic dissection or aneurysm. Positive for left lower lobe pulmonary artery embolus and thrombus series 2, image 74. No saddle embolus. No more central pulmonary clot identified. Chronic right heart metallic foci. Mild cardiomegaly stable since 2021. No pericardial effusion. Mediastinum/Nodes: No mediastinal mass or lymphadenopathy. Lungs/Pleura: Fairly numerous solid lung nodules bilaterally ranging from subcentimeter 2 2 cm diameter. No superimposed pleural effusion. Mild platelike opacity limited to the right lower lobe, more resembles atelectasis than infarct. No left lung base infarct. Upper Abdomen: Multifocal large and infiltrative liver masses are new since the 2021 CT Abdomen and Pelvis. At least 3 lesions with indistinct margins, the largest is at least 10 cm on series 2, image 100. Subsequent hepatomegaly is partially visible. No ascites in the upper abdomen. Visible spleen, adrenal glands, stomach remain within normal limits. Musculoskeletal: Mild upper thoracic compression fractures at T1, T3, T6. These are age indeterminate, with partially visible severe but chronic L1 compression fracture, present in  2021. Chronic sternal fracture with some displacement. No destructive  osseous lesion identified. IMPRESSION: 1. Positive for Metastatic Cancer and left lower lobe Pulmonary Embolus. No saddle embolus. And mild if any associated pulmonary infarct. No pleural effusion. Primary cancer origin unclear. There is a right side Port-A-Cath in place. 2. Partially visible hepatomegaly and multiple infiltrative Liver Masses. No ascites in the upper abdomen. 3. Multiple bilateral pulmonary nodules most compatible with Lung Metastases. 4. Multiple mild age indeterminate thoracic compression fractures, could be benign/osteoporotic. Chronic fractures of the sternum and chronic L1 compression. Electronically Signed: By: Genevie Ann M.D. On: 09/06/2022 07:57   DG Chest Portable 1 View  Result Date: 09/06/2022 CLINICAL DATA:  Left hip pain EXAM: PORTABLE CHEST 1 VIEW COMPARISON:  04/26/2022 FINDINGS: Elevation the right hemidiaphragm is present, new since prior examination. Multiple nodular densities of developed bilaterally, new since prior examination, possibly inflammatory, as can be seen with septic embolization, or related to pulmonary metastatic disease. No pneumothorax or pleural effusion. Right internal jugular central venous catheter tip is again seen at the superior cavoatrial junction. Leadless pacemaker in place. Cardiac size is mildly enlarged, unchanged. Pulmonary vascularity is normal. No acute bone abnormality. IMPRESSION: 1. Interval development of multiple nodular densities bilaterally, possibly inflammatory, as can be seen with septic embolization, or related to pulmonary metastatic disease. This would be better assessed with dedicated CT imaging if indicated. 2. New elevation of the right hemidiaphragm. 3. Stable cardiomegaly Electronically Signed   By: Fidela Salisbury M.D.   On: 09/06/2022 04:30   DG Hip Unilat With Pelvis 2-3 Views Left  Result Date: 09/06/2022 CLINICAL DATA:  Left hip pain, fall EXAM: DG  HIP (WITH OR WITHOUT PELVIS) 2-3V LEFT COMPARISON:  01/11/2019 FINDINGS: Left hip bipolar hemiarthroplasty has been performed. Normal alignment. No acute fracture or dislocation. Limited evaluation of the right hip is unremarkable. Soft tissues are unremarkable. IMPRESSION: Negative. Electronically Signed   By: Fidela Salisbury M.D.   On: 09/06/2022 04:25   CT Head Wo Contrast  Result Date: 09/06/2022 CLINICAL DATA:  Head trauma, minor (Age >= 65y) fall; Neck trauma (Age >= 65y) fall EXAM: CT HEAD WITHOUT CONTRAST CT CERVICAL SPINE WITHOUT CONTRAST TECHNIQUE: Multidetector CT imaging of the head and cervical spine was performed following the standard protocol without intravenous contrast. Multiplanar CT image reconstructions of the cervical spine were also generated. RADIATION DOSE REDUCTION: This exam was performed according to the departmental dose-optimization program which includes automated exposure control, adjustment of the mA and/or kV according to patient size and/or use of iterative reconstruction technique. COMPARISON:  CT head 04/26/2022, CT cervical spine 06/15/2020 FINDINGS: CT HEAD FINDINGS Brain: Normal anatomic configuration. Parenchymal volume loss is commensurate with the patient's age. Moderate periventricular white matter changes are present likely reflecting the sequela of small vessel ischemia. Remote lacunar infarct noted within the right basal ganglia. No abnormal intra or extra-axial mass lesion or fluid collection. No abnormal mass effect or midline shift. No evidence of acute intracranial hemorrhage or infarct. Ventricular size is normal. Cerebellum unremarkable. Vascular: No asymmetric hyperdense vasculature at the skull base. Skull: Intact Sinuses/Orbits: Paranasal sinuses are clear. Ocular lenses have been removed. Orbits are otherwise unremarkable. Other: Mastoid air cells and middle ear cavities are clear. CT CERVICAL SPINE FINDINGS Alignment: Normal. Skull base and vertebrae:  Imaging is slightly limited by motion artifact. Craniocervical alignment is normal. Atlantodental interval is not widened. No definite acute fracture of the cervical spine. Vertebral body height is preserved. Soft tissues and spinal canal: No prevertebral fluid or swelling. No visible canal hematoma.  Disc levels: Intervertebral disc space narrowing and endplate remodeling W9-U0 is present in keeping with changes of moderate degenerative disc disease. Milder degenerative changes are seen at C7-T1. Degenerative changes are noted at the atlantodental articulation. Prevertebral soft tissues are not thickened on sagittal reformats. Spinal canal is widely patent. No significant neuroforaminal narrowing Upper chest: Negative. Other: None IMPRESSION: 1. No acute intracranial abnormality. No calvarial fracture. 2. Moderate senescent change. 3. No acute fracture or listhesis of the cervical spine. Electronically Signed   By: Fidela Salisbury M.D.   On: 09/06/2022 04:21   CT Cervical Spine Wo Contrast  Result Date: 09/06/2022 CLINICAL DATA:  Head trauma, minor (Age >= 65y) fall; Neck trauma (Age >= 65y) fall EXAM: CT HEAD WITHOUT CONTRAST CT CERVICAL SPINE WITHOUT CONTRAST TECHNIQUE: Multidetector CT imaging of the head and cervical spine was performed following the standard protocol without intravenous contrast. Multiplanar CT image reconstructions of the cervical spine were also generated. RADIATION DOSE REDUCTION: This exam was performed according to the departmental dose-optimization program which includes automated exposure control, adjustment of the mA and/or kV according to patient size and/or use of iterative reconstruction technique. COMPARISON:  CT head 04/26/2022, CT cervical spine 06/15/2020 FINDINGS: CT HEAD FINDINGS Brain: Normal anatomic configuration. Parenchymal volume loss is commensurate with the patient's age. Moderate periventricular white matter changes are present likely reflecting the sequela of  small vessel ischemia. Remote lacunar infarct noted within the right basal ganglia. No abnormal intra or extra-axial mass lesion or fluid collection. No abnormal mass effect or midline shift. No evidence of acute intracranial hemorrhage or infarct. Ventricular size is normal. Cerebellum unremarkable. Vascular: No asymmetric hyperdense vasculature at the skull base. Skull: Intact Sinuses/Orbits: Paranasal sinuses are clear. Ocular lenses have been removed. Orbits are otherwise unremarkable. Other: Mastoid air cells and middle ear cavities are clear. CT CERVICAL SPINE FINDINGS Alignment: Normal. Skull base and vertebrae: Imaging is slightly limited by motion artifact. Craniocervical alignment is normal. Atlantodental interval is not widened. No definite acute fracture of the cervical spine. Vertebral body height is preserved. Soft tissues and spinal canal: No prevertebral fluid or swelling. No visible canal hematoma. Disc levels: Intervertebral disc space narrowing and endplate remodeling A5-W0 is present in keeping with changes of moderate degenerative disc disease. Milder degenerative changes are seen at C7-T1. Degenerative changes are noted at the atlantodental articulation. Prevertebral soft tissues are not thickened on sagittal reformats. Spinal canal is widely patent. No significant neuroforaminal narrowing Upper chest: Negative. Other: None IMPRESSION: 1. No acute intracranial abnormality. No calvarial fracture. 2. Moderate senescent change. 3. No acute fracture or listhesis of the cervical spine. Electronically Signed   By: Fidela Salisbury M.D.   On: 09/06/2022 04:21    Assessment/Plan 1. Hospice care patient 2. Acute respiratory failure with hypoxia and hypercapnia ([name]) 3. Chronic deep vein thrombosis (DVT) of right lower extremity, unspecified vein ([name]) 4. Delirium 5. Do not resuscitate 6. 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 Patient's O2 was 82% over the weekend and she is now requiring oxygen. Patient is disoriented and no longer speaking in complete sentences. She is writhing in bed, concern for discomfort will initiate medications for comfort measures and discontinue all chronic disease medications at this time. Based on symptoms, patient could meet criteria for inpatient hospice, however, desire is to keep her comfortable here  in the facility.  - morphine (ROXANOL) 20 MG/ML concentrated solution; Take 0.25 mLs (5 mg total) by mouth every 2 (two) hours as needed for severe pain.  Dispense: 30 mL; Refill: 0 - ativan 0.5 mg q8hr PRN.     Family/ staff Communication: [name], Hospice nurse, Healthcare center nurse  Labs/tests ordered:  none.  Tomasa Rand, MD, [facility] [phone]

## 2022-09-20 NOTE — Progress Notes (Signed)
Patient transitioning order comfort only medications. Morphine removed from allergies as it was more related to AMS than actual allergy per daughter [facility] request. Morphine and ativan ordered.

## 2022-09-22 ENCOUNTER — Encounter: Payer: Self-pay | Admitting: Student

## 2022-09-22 ENCOUNTER — Non-Acute Institutional Stay (SKILLED_NURSING_FACILITY): Admitting: Student

## 2022-09-22 DIAGNOSIS — Z515 Encounter for palliative care: Secondary | ICD-10-CM

## 2022-09-22 MED ORDER — LORAZEPAM 1 MG PO TABS: 1.0000 mg | ORAL_TABLET | ORAL | 0 refills | Status: AC | PRN

## 2022-09-22 MED ORDER — MORPHINE SULFATE (CONCENTRATE) 20 MG/ML PO SOLN: 5.0000 mg | ORAL | 0 refills | Status: AC | PRN

## 2022-09-26 NOTE — Addendum Note (Signed)
Addended by: Dewayne Shorter on: 09/26/2022 08:25 PM   Modules accepted: Level of Service

## 2022-10-08 NOTE — Progress Notes (Addendum)
Location:  Other [place].  Nursing Home Room Number: [place] of Service:  SNF [phone]) Provider:  Dr. Amada Kingfisher, MD  Patient Care Team: Dewayne Shorter, MD as PCP - General [facility] Medicine)  Extended Emergency Contact Information Primary Emergency Contact: [place], [place] [address] [address] Litter of [place] Phone: [phone] Relation: Daughter  Code Status:  DNR Goals of care: Advanced Directive information    10-08-2022   11:49 AM  Advanced Directives  Does Patient Have a Medical Advance Directive? Yes  Type of Advance Directive Out of facility DNR (pink MOST or yellow form)  Does patient want to make changes to medical advance directive? No - Patient declined     Chief Complaint  Patient presents with   Acute Visit    End of Life Care. Hospice patient. Vitals are from 11/8, [facility], except for Temp.    HPI:  Pt is a 86 y.o. female seen today for an acute visit for discomfort while on hospice.   Family is at bedside and state they have all they need.    Past Medical History:  Diagnosis Date   Anginal pain ([place])    Anxiety    Diabetes mellitus without complication ([place])    Hypertension    Hypothyroidism    Protein C deficiency ([place])    Protein S deficiency ([place])    Past Surgical History:  Procedure Laterality Date   ABDOMINAL SURGERY     BACK SURGERY     BREAST EXCISIONAL BIOPSY Left 2003?   benign   COLONOSCOPY N/A 09/12/2017   Procedure: COLONOSCOPY;  Surgeon: Lollie Sails, MD;  Location: [facility] ENDOSCOPY;  Service: Endoscopy;  Laterality: N/A;   COLONOSCOPY WITH PROPOFOL N/A 01/20/2016   Procedure: COLONOSCOPY WITH PROPOFOL;  Surgeon: Lollie Sails, MD;  Location: [facility] ENDOSCOPY;  Service: Endoscopy;  Laterality: N/A;   FRACTURE SURGERY     HIP ARTHROPLASTY Left 01/12/2019   Procedure: ARTHROPLASTY BIPOLAR HIP (HEMIARTHROPLASTY), LEFT;  Surgeon: Leim Fabry, MD;  Location: [facility] ORS;   Service: Orthopedics;  Laterality: Left;   PACEMAKER LEADLESS INSERTION N/A 09/20/2019   Procedure: PACEMAKER LEADLESS INSERTION;  Surgeon: Isaias Cowman, MD;  Location: [place] CV LAB;  Service: Cardiovascular;  Laterality: N/A;   PERIPHERAL VASCULAR CATHETERIZATION N/A 05/13/2015   Procedure: IVC Filter Removal;  Surgeon: Katha Cabal, MD;  Location: [place] CV LAB;  Service: Cardiovascular;  Laterality: N/A;   TEMPORARY PACEMAKER Right 09/20/2019   Procedure: TEMPORARY PACEMAKER;  Surgeon: Isaias Cowman, MD;  Location: [place] CV LAB;  Service: Cardiovascular;  Laterality: Right;   TONSILLECTOMY      Allergies  Allergen Reactions   Gentamicin Other (See Comments)    Kidney function    Outpatient Encounter Medications as of 08-Oct-2022  Medication Sig   acetaminophen (TYLENOL) 325 MG tablet Take 650 mg by mouth every 6 (six) hours as needed for moderate pain or mild pain (pain).   carbamide peroxide (DEBROX) 6.5 % OTIC solution Place 5 drops into both ears as needed.   cetaphil (CETAPHIL) lotion Apply 1 Application topically 3 (three) times daily.   diclofenac Sodium (VOLTAREN) 1 % GEL Apply 2 g topically every 8 (eight) hours as needed.   hydrocortisone 2.5 % cream Apply 1 Application topically every 8 (eight) hours as needed.   Infant Care Products [facility]) OINT Apply to buttocks topically every shift for redness.   LORazepam (ATIVAN) 0.5  MG tablet Take 0.5 mg by mouth every 8 (eight) hours as needed for anxiety.   LORazepam (ATIVAN) 1 MG tablet Take 1 mg by mouth every 4 (four) hours as needed for anxiety.   morphine (ROXANOL) 20 MG/ML concentrated solution Take 0.25 mLs (5 mg total) by mouth every 2 (two) hours as needed for severe pain. (Patient taking differently: Give 0.24m by mouth every 1 hour as needed for SOB Air Hunger.)   morphine 10 MG/5ML solution Give 0.249mby mouth every 2 hours as needed   OXYGEN 2lpm via nasal cannula as needed    [DISCONTINUED] LORazepam (ATIVAN) 0.5 MG tablet Take 1 tablet (0.5 mg total) by mouth every 6 (six) hours as needed for anxiety (agitation, restlessness).   No facility-administered encounter medications on file as of 11Nov 19, 2023   Review of Systems  Unable to perform ROS: Mental status change    Immunization History  Administered Date(s) Administered   Fluad Quad(high Dose 65+) 08/10/2019   Influenza Split 09/14/2011, 08/31/2012, 08/29/2014, 09/22/2015   Influenza, High Dose Seasonal PF 12/04/2018, 12/04/2018, 08/24/2022   Influenza,inj,Quad PF,6+ Mos 11/02/2016   Influenza,inj,quad, With Preservative 08/31/2012   Influenza-Unspecified 08/08/2014, 08/29/2014, 09/22/2015, 09/26/2017, 08/24/2022   Moderna Sars-Covid-2 Vaccination 11/11/2019, 11/22/2019, 12/20/2019, 01/09/2020, 07/11/2020, 09/23/2020   Pfizer Covid-19 Vaccine Bivalent Booster 1243yr up 07/31/2021   Pneumococcal Polysaccharide-23 06/28/2011   Pneumococcal-Unspecified 06/08/2013   Tdap 11/09/2011, 01/05/2013, 02/13/2020   Pertinent  Health Maintenance Due  Topic Date Due   FOOT EXAM  Never done   OPHTHALMOLOGY EXAM  Never done   DEXA SCAN  Never done   HEMOGLOBIN A1C  03/04/2023   INFLUENZA VACCINE  Completed      04/26/2022    8:58 PM 04/27/2022   10:26 AM 04/27/2022   10:34 PM 04/28/2022    8:43 AM 09/06/2022    3:27 AM  Fall Risk  Patient Fall Risk Level High fall risk High fall risk High fall risk High fall risk Moderate fall risk   Functional Status Survey:    Vitals:   09/18/18/2340  BP: (!) 168/79  Pulse: 65  Resp: 20  Temp: (!) 104.1 F (40.1 C)  SpO2: 95%  Weight: 182 lb 6.4 oz (82.7 kg)  Height: '5\' 7"'$  (1.702 m)   Body mass index is 28.57 kg/m. Physical Exam Vitals reviewed.  Constitutional:      Comments: Patient has grimace on face with eyes closed lying flat on her back. Wearing Nasal canula     Labs reviewed: Recent Labs    04/27/22 0633 04/28/22 0649 04/29/22 0000  09/02/22 0000 09/06/22 0340 09/08/22 0000  NA 146* 144   < > 130* 132* 134*  K 3.1* 3.5   < > 4.2 4.2 4.4  CL 102 104   < > 93* 95* 94*  CO2 34* 31   < > 28* 25 28*  GLUCOSE 168* 196*  --   --  168*  --   BUN 30* 26*   < > '17 23 20  '$ CREATININE 1.42* 1.32*   < > 1.3* 1.19* 1.1  CALCIUM 9.5 9.9   < > 9.2 9.5 9.4  MG 1.4* 2.0  --   --   --   --    < > = values in this interval not displayed.   Recent Labs    01/17/22 1022 04/23/22 0957 04/29/22 0000 08/12/22 0000 09/02/22 0000 09/06/22 0340  AST 19 16   < > 24 36* 42*  ALT  11 13   < > '11 14 19  '$ ALKPHOS 42 47   < > 88 115 122  BILITOT 0.9 0.8  --   --   --  1.2  PROT 6.7 5.6*  --   --   --  6.7  ALBUMIN 3.7 3.2*   < > 3.2* 3.3* 3.1*   < > = values in this interval not displayed.   Recent Labs    04/27/22 0633 04/28/22 0649 04/29/22 0000 08/12/22 0000 09/02/22 0000 09/06/22 0340 09/08/22 0000  WBC 8.8 10.1   < > 6.5 10.4 10.3 11.1  NEUTROABS  --   --    < > 4,960.00  --  8.5* 9,380.00  HGB 11.9* 12.2   < > 10.8* 10.4* 10.6* 10.0*  HCT 37.7 38.0   < > 34* 33* 34.0* 31*  MCV 91.5 91.3  --   --   --  81.7  --   PLT 125* 118*   < > 182 232 296 294   < > = values in this interval not displayed.   Lab Results  Component Value Date   TSH 2.08 09/08/2022   Lab Results  Component Value Date   HGBA1C 7.6 09/02/2022   Lab Results  Component Value Date   CHOL 130 01/18/2022   HDL 41 01/18/2022   LDLCALC 73 01/18/2022   TRIG 82 01/18/2022   CHOLHDL 3.2 01/18/2022    Significant Diagnostic Results in last 30 days:  CT Chest W Contrast  Addendum Date: 09/06/2022   ADDENDUM REPORT: 09/06/2022 08:18 ADDENDUM: Study discussed by telephone with Dr. Cyril Mourning WARD on 09/06/2022 at 0807 hours. She advises that she thinks this patient carries a diagnosis of ovarian cancer. Electronically Signed   By: Genevie Ann M.D.   On: 09/06/2022 08:18   Result Date: 09/06/2022 CLINICAL DATA:  86 year old female with new bilateral lung  nodules on portable chest this morning. Fall with left hip pain. On Eliquis. EXAM: CT CHEST WITH CONTRAST TECHNIQUE: Multidetector CT imaging of the chest was performed during intravenous contrast administration. RADIATION DOSE REDUCTION: This exam was performed according to the departmental dose-optimization program which includes automated exposure control, adjustment of the mA and/or kV according to patient size and/or use of iterative reconstruction technique. CONTRAST:  3m OMNIPAQUE IOHEXOL 300 MG/ML  SOLN COMPARISON:  Portable chest 0351 hours today. CT Abdomen and Pelvis 10/23/2020. FINDINGS: Cardiovascular: Right chest Port-A-Cath. Calcified aortic atherosclerosis. Negative for thoracic aortic dissection or aneurysm. Positive for left lower lobe pulmonary artery embolus and thrombus series 2, image 74. No saddle embolus. No more central pulmonary clot identified. Chronic right heart metallic foci. Mild cardiomegaly stable since 2021. No pericardial effusion. Mediastinum/Nodes: No mediastinal mass or lymphadenopathy. Lungs/Pleura: Fairly numerous solid lung nodules bilaterally ranging from subcentimeter 2 2 cm diameter. No superimposed pleural effusion. Mild platelike opacity limited to the right lower lobe, more resembles atelectasis than infarct. No left lung base infarct. Upper Abdomen: Multifocal large and infiltrative liver masses are new since the 2021 CT Abdomen and Pelvis. At least 3 lesions with indistinct margins, the largest is at least 10 cm on series 2, image 100. Subsequent hepatomegaly is partially visible. No ascites in the upper abdomen. Visible spleen, adrenal glands, stomach remain within normal limits. Musculoskeletal: Mild upper thoracic compression fractures at T1, T3, T6. These are age indeterminate, with partially visible severe but chronic L1 compression fracture, present in 2021. Chronic sternal fracture with some displacement. No destructive osseous lesion identified. IMPRESSION:  1. Positive for Metastatic Cancer and left lower lobe Pulmonary Embolus. No saddle embolus. And mild if any associated pulmonary infarct. No pleural effusion. Primary cancer origin unclear. There is a right side Port-A-Cath in place. 2. Partially visible hepatomegaly and multiple infiltrative Liver Masses. No ascites in the upper abdomen. 3. Multiple bilateral pulmonary nodules most compatible with Lung Metastases. 4. Multiple mild age indeterminate thoracic compression fractures, could be benign/osteoporotic. Chronic fractures of the sternum and chronic L1 compression. Electronically Signed: By: Genevie Ann M.D. On: 09/06/2022 07:57   DG Chest Portable 1 View  Result Date: 09/06/2022 CLINICAL DATA:  Left hip pain EXAM: PORTABLE CHEST 1 VIEW COMPARISON:  04/26/2022 FINDINGS: Elevation the right hemidiaphragm is present, new since prior examination. Multiple nodular densities of developed bilaterally, new since prior examination, possibly inflammatory, as can be seen with septic embolization, or related to pulmonary metastatic disease. No pneumothorax or pleural effusion. Right internal jugular central venous catheter tip is again seen at the superior cavoatrial junction. Leadless pacemaker in place. Cardiac size is mildly enlarged, unchanged. Pulmonary vascularity is normal. No acute bone abnormality. IMPRESSION: 1. Interval development of multiple nodular densities bilaterally, possibly inflammatory, as can be seen with septic embolization, or related to pulmonary metastatic disease. This would be better assessed with dedicated CT imaging if indicated. 2. New elevation of the right hemidiaphragm. 3. Stable cardiomegaly Electronically Signed   By: Fidela Salisbury M.D.   On: 09/06/2022 04:30   DG Hip Unilat With Pelvis 2-3 Views Left  Result Date: 09/06/2022 CLINICAL DATA:  Left hip pain, fall EXAM: DG HIP (WITH OR WITHOUT PELVIS) 2-3V LEFT COMPARISON:  01/11/2019 FINDINGS: Left hip bipolar hemiarthroplasty has  been performed. Normal alignment. No acute fracture or dislocation. Limited evaluation of the right hip is unremarkable. Soft tissues are unremarkable. IMPRESSION: Negative. Electronically Signed   By: Fidela Salisbury M.D.   On: 09/06/2022 04:25   CT Head Wo Contrast  Result Date: 09/06/2022 CLINICAL DATA:  Head trauma, minor (Age >= 65y) fall; Neck trauma (Age >= 65y) fall EXAM: CT HEAD WITHOUT CONTRAST CT CERVICAL SPINE WITHOUT CONTRAST TECHNIQUE: Multidetector CT imaging of the head and cervical spine was performed following the standard protocol without intravenous contrast. Multiplanar CT image reconstructions of the cervical spine were also generated. RADIATION DOSE REDUCTION: This exam was performed according to the departmental dose-optimization program which includes automated exposure control, adjustment of the mA and/or kV according to patient size and/or use of iterative reconstruction technique. COMPARISON:  CT head 04/26/2022, CT cervical spine 06/15/2020 FINDINGS: CT HEAD FINDINGS Brain: Normal anatomic configuration. Parenchymal volume loss is commensurate with the patient's age. Moderate periventricular white matter changes are present likely reflecting the sequela of small vessel ischemia. Remote lacunar infarct noted within the right basal ganglia. No abnormal intra or extra-axial mass lesion or fluid collection. No abnormal mass effect or midline shift. No evidence of acute intracranial hemorrhage or infarct. Ventricular size is normal. Cerebellum unremarkable. Vascular: No asymmetric hyperdense vasculature at the skull base. Skull: Intact Sinuses/Orbits: Paranasal sinuses are clear. Ocular lenses have been removed. Orbits are otherwise unremarkable. Other: Mastoid air cells and middle ear cavities are clear. CT CERVICAL SPINE FINDINGS Alignment: Normal. Skull base and vertebrae: Imaging is slightly limited by motion artifact. Craniocervical alignment is normal. Atlantodental interval is not  widened. No definite acute fracture of the cervical spine. Vertebral body height is preserved. Soft tissues and spinal canal: No prevertebral fluid or swelling. No visible canal hematoma. Disc levels: Intervertebral disc  space narrowing and endplate remodeling N0-U7 is present in keeping with changes of moderate degenerative disc disease. Milder degenerative changes are seen at C7-T1. Degenerative changes are noted at the atlantodental articulation. Prevertebral soft tissues are not thickened on sagittal reformats. Spinal canal is widely patent. No significant neuroforaminal narrowing Upper chest: Negative. Other: None IMPRESSION: 1. No acute intracranial abnormality. No calvarial fracture. 2. Moderate senescent change. 3. No acute fracture or listhesis of the cervical spine. Electronically Signed   By: Fidela Salisbury M.D.   On: 09/06/2022 04:21   CT Cervical Spine Wo Contrast  Result Date: 09/06/2022 CLINICAL DATA:  Head trauma, minor (Age >= 65y) fall; Neck trauma (Age >= 65y) fall EXAM: CT HEAD WITHOUT CONTRAST CT CERVICAL SPINE WITHOUT CONTRAST TECHNIQUE: Multidetector CT imaging of the head and cervical spine was performed following the standard protocol without intravenous contrast. Multiplanar CT image reconstructions of the cervical spine were also generated. RADIATION DOSE REDUCTION: This exam was performed according to the departmental dose-optimization program which includes automated exposure control, adjustment of the mA and/or kV according to patient size and/or use of iterative reconstruction technique. COMPARISON:  CT head 04/26/2022, CT cervical spine 06/15/2020 FINDINGS: CT HEAD FINDINGS Brain: Normal anatomic configuration. Parenchymal volume loss is commensurate with the patient's age. Moderate periventricular white matter changes are present likely reflecting the sequela of small vessel ischemia. Remote lacunar infarct noted within the right basal ganglia. No abnormal intra or extra-axial  mass lesion or fluid collection. No abnormal mass effect or midline shift. No evidence of acute intracranial hemorrhage or infarct. Ventricular size is normal. Cerebellum unremarkable. Vascular: No asymmetric hyperdense vasculature at the skull base. Skull: Intact Sinuses/Orbits: Paranasal sinuses are clear. Ocular lenses have been removed. Orbits are otherwise unremarkable. Other: Mastoid air cells and middle ear cavities are clear. CT CERVICAL SPINE FINDINGS Alignment: Normal. Skull base and vertebrae: Imaging is slightly limited by motion artifact. Craniocervical alignment is normal. Atlantodental interval is not widened. No definite acute fracture of the cervical spine. Vertebral body height is preserved. Soft tissues and spinal canal: No prevertebral fluid or swelling. No visible canal hematoma. Disc levels: Intervertebral disc space narrowing and endplate remodeling O5-D6 is present in keeping with changes of moderate degenerative disc disease. Milder degenerative changes are seen at C7-T1. Degenerative changes are noted at the atlantodental articulation. Prevertebral soft tissues are not thickened on sagittal reformats. Spinal canal is widely patent. No significant neuroforaminal narrowing Upper chest: Negative. Other: None IMPRESSION: 1. No acute intracranial abnormality. No calvarial fracture. 2. Moderate senescent change. 3. No acute fracture or listhesis of the cervical spine. Electronically Signed   By: Fidela Salisbury M.D.   On: 09/06/2022 04:21    Assessment/Plan 1. Hospice care patient Patient's symptoms continue to progress. Fevers today, improving with tylenol. Will shorten interval of pain medication and medication for restlessness given her apparent discomfort. Will order Morphine 5 mg q1hr PRN and ativan '1mg'$  q4 hrs PRN for her symptoms.    Family/ staff Communication: [name], Nursing  Labs/tests ordered:  none.

## 2022-10-08 DEATH — deceased

## 2022-12-08 ENCOUNTER — Ambulatory Visit (INDEPENDENT_AMBULATORY_CARE_PROVIDER_SITE_OTHER): Payer: Medicare Other | Admitting: Nurse Practitioner
